# Patient Record
Sex: Female | Born: 1945 | Race: White | Hispanic: No | Marital: Single | Smoking: Former smoker
Health system: Southern US, Community
[De-identification: ages and names within clinical notes are randomized; demographics above are authoritative.]

## PROBLEM LIST (undated history)

## (undated) DIAGNOSIS — I219 Acute myocardial infarction, unspecified: Secondary | ICD-10-CM

## (undated) DIAGNOSIS — F329 Major depressive disorder, single episode, unspecified: Secondary | ICD-10-CM

## (undated) DIAGNOSIS — F1010 Alcohol abuse, uncomplicated: Secondary | ICD-10-CM

## (undated) DIAGNOSIS — F419 Anxiety disorder, unspecified: Secondary | ICD-10-CM

## (undated) DIAGNOSIS — I10 Essential (primary) hypertension: Secondary | ICD-10-CM

## (undated) DIAGNOSIS — I2510 Atherosclerotic heart disease of native coronary artery without angina pectoris: Secondary | ICD-10-CM

## (undated) DIAGNOSIS — R0600 Dyspnea, unspecified: Secondary | ICD-10-CM

## (undated) DIAGNOSIS — Z95810 Presence of automatic (implantable) cardiac defibrillator: Secondary | ICD-10-CM

## (undated) DIAGNOSIS — F32A Depression, unspecified: Secondary | ICD-10-CM

## (undated) DIAGNOSIS — Z9289 Personal history of other medical treatment: Secondary | ICD-10-CM

## (undated) DIAGNOSIS — I509 Heart failure, unspecified: Secondary | ICD-10-CM

## (undated) DIAGNOSIS — M1990 Unspecified osteoarthritis, unspecified site: Secondary | ICD-10-CM

## (undated) HISTORY — PX: TUBAL LIGATION: SHX77

## (undated) HISTORY — PX: COLONOSCOPY: SHX174

## (undated) HISTORY — PX: OTHER SURGICAL HISTORY: SHX169

---

## 2016-06-05 DIAGNOSIS — Z9289 Personal history of other medical treatment: Secondary | ICD-10-CM

## 2016-06-05 HISTORY — DX: Personal history of other medical treatment: Z92.89

## 2016-12-03 ENCOUNTER — Inpatient Hospital Stay (HOSPITAL_BASED_OUTPATIENT_CLINIC_OR_DEPARTMENT_OTHER)
Admission: RE | Admit: 2016-12-03 | Discharge: 2016-12-15 | DRG: 233 | Disposition: A | Discharge status: Skilled Nursing Facility | Payer: Medicare PPO | Attending: Cardiothoracic Surgery | Admitting: Cardiothoracic Surgery

## 2016-12-03 ENCOUNTER — Emergency Department (HOSPITAL_BASED_OUTPATIENT_CLINIC_OR_DEPARTMENT_OTHER): Payer: Medicare PPO

## 2016-12-03 ENCOUNTER — Encounter (HOSPITAL_COMMUNITY)
Admission: RE | Disposition: A | Discharge status: Skilled Nursing Facility | Payer: Self-pay | Attending: Cardiothoracic Surgery

## 2016-12-03 ENCOUNTER — Encounter (HOSPITAL_BASED_OUTPATIENT_CLINIC_OR_DEPARTMENT_OTHER): Payer: Self-pay | Admitting: *Deleted

## 2016-12-03 DIAGNOSIS — I214 Non-ST elevation (NSTEMI) myocardial infarction: Secondary | ICD-10-CM | POA: Diagnosis present

## 2016-12-03 DIAGNOSIS — Z79899 Other long term (current) drug therapy: Secondary | ICD-10-CM

## 2016-12-03 DIAGNOSIS — I350 Nonrheumatic aortic (valve) stenosis: Secondary | ICD-10-CM | POA: Diagnosis not present

## 2016-12-03 DIAGNOSIS — R7989 Other specified abnormal findings of blood chemistry: Secondary | ICD-10-CM | POA: Diagnosis present

## 2016-12-03 DIAGNOSIS — J984 Other disorders of lung: Secondary | ICD-10-CM | POA: Diagnosis present

## 2016-12-03 DIAGNOSIS — J449 Chronic obstructive pulmonary disease, unspecified: Secondary | ICD-10-CM | POA: Diagnosis present

## 2016-12-03 DIAGNOSIS — J432 Centrilobular emphysema: Secondary | ICD-10-CM

## 2016-12-03 DIAGNOSIS — D62 Acute posthemorrhagic anemia: Secondary | ICD-10-CM | POA: Diagnosis not present

## 2016-12-03 DIAGNOSIS — R000 Tachycardia, unspecified: Secondary | ICD-10-CM | POA: Diagnosis present

## 2016-12-03 DIAGNOSIS — Z09 Encounter for follow-up examination after completed treatment for conditions other than malignant neoplasm: Secondary | ICD-10-CM

## 2016-12-03 DIAGNOSIS — Z888 Allergy status to other drugs, medicaments and biological substances status: Secondary | ICD-10-CM

## 2016-12-03 DIAGNOSIS — E876 Hypokalemia: Secondary | ICD-10-CM | POA: Diagnosis present

## 2016-12-03 DIAGNOSIS — J9811 Atelectasis: Secondary | ICD-10-CM

## 2016-12-03 DIAGNOSIS — Z9889 Other specified postprocedural states: Secondary | ICD-10-CM

## 2016-12-03 DIAGNOSIS — I10 Essential (primary) hypertension: Secondary | ICD-10-CM | POA: Diagnosis present

## 2016-12-03 DIAGNOSIS — I2510 Atherosclerotic heart disease of native coronary artery without angina pectoris: Secondary | ICD-10-CM | POA: Diagnosis present

## 2016-12-03 DIAGNOSIS — I110 Hypertensive heart disease with heart failure: Secondary | ICD-10-CM | POA: Diagnosis present

## 2016-12-03 DIAGNOSIS — I255 Ischemic cardiomyopathy: Secondary | ICD-10-CM | POA: Diagnosis present

## 2016-12-03 DIAGNOSIS — Z951 Presence of aortocoronary bypass graft: Secondary | ICD-10-CM | POA: Diagnosis not present

## 2016-12-03 DIAGNOSIS — I739 Peripheral vascular disease, unspecified: Secondary | ICD-10-CM | POA: Diagnosis not present

## 2016-12-03 DIAGNOSIS — I5021 Acute systolic (congestive) heart failure: Secondary | ICD-10-CM | POA: Diagnosis present

## 2016-12-03 DIAGNOSIS — R0989 Other specified symptoms and signs involving the circulatory and respiratory systems: Secondary | ICD-10-CM | POA: Diagnosis present

## 2016-12-03 DIAGNOSIS — Z9851 Tubal ligation status: Secondary | ICD-10-CM

## 2016-12-03 DIAGNOSIS — F17210 Nicotine dependence, cigarettes, uncomplicated: Secondary | ICD-10-CM | POA: Diagnosis present

## 2016-12-03 DIAGNOSIS — I219 Acute myocardial infarction, unspecified: Secondary | ICD-10-CM

## 2016-12-03 DIAGNOSIS — Z01810 Encounter for preprocedural cardiovascular examination: Secondary | ICD-10-CM | POA: Diagnosis not present

## 2016-12-03 DIAGNOSIS — R600 Localized edema: Secondary | ICD-10-CM

## 2016-12-03 DIAGNOSIS — Z7982 Long term (current) use of aspirin: Secondary | ICD-10-CM | POA: Diagnosis not present

## 2016-12-03 DIAGNOSIS — I509 Heart failure, unspecified: Secondary | ICD-10-CM | POA: Diagnosis not present

## 2016-12-03 DIAGNOSIS — M7989 Other specified soft tissue disorders: Secondary | ICD-10-CM

## 2016-12-03 HISTORY — PX: LEFT HEART CATH AND CORONARY ANGIOGRAPHY: CATH118249

## 2016-12-03 HISTORY — DX: Acute myocardial infarction, unspecified: I21.9

## 2016-12-03 HISTORY — DX: Alcohol abuse, uncomplicated: F10.10

## 2016-12-03 HISTORY — DX: Essential (primary) hypertension: I10

## 2016-12-03 LAB — COMPREHENSIVE METABOLIC PANEL
ALBUMIN: 3.7 g/dL (ref 3.5–5.0)
ALK PHOS: 84 U/L (ref 38–126)
ALT: 20 U/L (ref 14–54)
ANION GAP: 12 (ref 5–15)
AST: 75 U/L — ABNORMAL HIGH (ref 15–41)
BILIRUBIN TOTAL: 0.7 mg/dL (ref 0.3–1.2)
BUN: 11 mg/dL (ref 6–20)
CALCIUM: 8.6 mg/dL — AB (ref 8.9–10.3)
CO2: 25 mmol/L (ref 22–32)
Chloride: 103 mmol/L (ref 101–111)
Creatinine, Ser: 0.6 mg/dL (ref 0.44–1.00)
GFR calc Af Amer: >60 mL/min (ref >60)
GLUCOSE: 146 mg/dL — AB (ref 65–99)
POTASSIUM: 3.1 mmol/L — AB (ref 3.5–5.1)
Sodium: 140 mmol/L (ref 135–145)
TOTAL PROTEIN: 6.8 g/dL (ref 6.5–8.1)

## 2016-12-03 LAB — CBC WITH DIFFERENTIAL/PLATELET
Basophils Absolute: 0 10*3/uL (ref 0.0–0.1)
Basophils Relative: 0 %
Eosinophils Absolute: 0 10*3/uL (ref 0.0–0.7)
Eosinophils Relative: 0 %
HEMATOCRIT: 32.5 % — AB (ref 36.0–46.0)
HEMOGLOBIN: 11 g/dL — AB (ref 12.0–15.0)
LYMPHS ABS: 1 10*3/uL (ref 0.7–4.0)
LYMPHS PCT: 9 %
MCH: 29.9 pg (ref 26.0–34.0)
MCHC: 33.8 g/dL (ref 30.0–36.0)
MCV: 88.3 fL (ref 78.0–100.0)
MONO ABS: 0.7 10*3/uL (ref 0.1–1.0)
MONOS PCT: 7 %
NEUTROS ABS: 9.3 10*3/uL — AB (ref 1.7–7.7)
NEUTROS PCT: 84 %
Platelets: 301 10*3/uL (ref 150–400)
RBC: 3.68 MIL/uL — ABNORMAL LOW (ref 3.87–5.11)
RDW: 13.9 % (ref 11.5–15.5)
WBC: 11 10*3/uL — ABNORMAL HIGH (ref 4.0–10.5)

## 2016-12-03 LAB — LIPID PANEL
Cholesterol: 163 mg/dL (ref 0–200)
HDL: 42 mg/dL (ref >40)
LDL CALC: 110 mg/dL — AB (ref 0–99)
TRIGLYCERIDES: 55 mg/dL (ref <150)
Total CHOL/HDL Ratio: 3.9 RATIO
VLDL: 11 mg/dL (ref 0–40)

## 2016-12-03 LAB — TROPONIN I
TROPONIN I: 8.24 ng/mL — AB (ref <0.03)
Troponin I: 7.95 ng/mL (ref <0.03)
Troponin I: 6.84 ng/mL (ref <0.03)

## 2016-12-03 LAB — I-STAT CG4 LACTIC ACID, ED: LACTIC ACID, VENOUS: 1.22 mmol/L (ref 0.5–1.9)

## 2016-12-03 LAB — BRAIN NATRIURETIC PEPTIDE
B NATRIURETIC PEPTIDE 5: 2339.4 pg/mL — AB (ref 0.0–100.0)
B Natriuretic Peptide: 2721.4 pg/mL — ABNORMAL HIGH (ref 0.0–100.0)

## 2016-12-03 LAB — TSH: TSH: 3.663 u[IU]/mL (ref 0.350–4.500)

## 2016-12-03 SURGERY — LEFT HEART CATH AND CORONARY ANGIOGRAPHY
Anesthesia: LOCAL

## 2016-12-03 MED ORDER — HEPARIN SODIUM (PORCINE) 1000 UNIT/ML IJ SOLN
INTRAMUSCULAR | Status: AC
  Filled 2016-12-03: qty 1

## 2016-12-03 MED ORDER — MIDAZOLAM HCL 2 MG/2ML IJ SOLN
INTRAMUSCULAR | Status: AC
  Filled 2016-12-03: qty 2

## 2016-12-03 MED ORDER — PNEUMOCOCCAL VAC POLYVALENT 25 MCG/0.5ML IJ INJ: 0.5000 mL | INJECTION | INTRAMUSCULAR | Status: DC

## 2016-12-03 MED ORDER — ASPIRIN EC 81 MG PO TBEC
81.0000 mg | DELAYED_RELEASE_TABLET | Freq: Every day | ORAL | Status: DC
  Administered 2016-12-04 – 2016-12-07 (×4): 81 mg via ORAL
  Filled 2016-12-03 (×4): qty 1

## 2016-12-03 MED ORDER — ASPIRIN 81 MG PO CHEW
324.0000 mg | CHEWABLE_TABLET | Freq: Once | ORAL | Status: AC
  Administered 2016-12-03: 324 mg via ORAL
  Filled 2016-12-03: qty 4

## 2016-12-03 MED ORDER — HEPARIN BOLUS VIA INFUSION
3000.0000 [IU] | Freq: Once | INTRAVENOUS | Status: AC
  Administered 2016-12-03: 3000 [IU] via INTRAVENOUS

## 2016-12-03 MED ORDER — BUPROPION HCL ER (XL) 150 MG PO TB24
300.0000 mg | ORAL_TABLET | Freq: Every day | ORAL | Status: DC
  Administered 2016-12-04 – 2016-12-07 (×4): 300 mg via ORAL
  Filled 2016-12-03 (×4): qty 2

## 2016-12-03 MED ORDER — FENTANYL CITRATE (PF) 100 MCG/2ML IJ SOLN
INTRAMUSCULAR | Status: DC | PRN
  Administered 2016-12-03: 25 ug via INTRAVENOUS

## 2016-12-03 MED ORDER — HEPARIN (PORCINE) IN NACL 2-0.9 UNIT/ML-% IJ SOLN
INTRAMUSCULAR | Status: AC
  Filled 2016-12-03: qty 1000

## 2016-12-03 MED ORDER — SERTRALINE HCL 100 MG PO TABS
200.0000 mg | ORAL_TABLET | Freq: Every day | ORAL | Status: DC
  Administered 2016-12-04 – 2016-12-07 (×4): 200 mg via ORAL
  Filled 2016-12-03 (×4): qty 2

## 2016-12-03 MED ORDER — FUROSEMIDE 10 MG/ML IJ SOLN
40.0000 mg | Freq: Once | INTRAMUSCULAR | Status: AC
  Administered 2016-12-03: 40 mg via INTRAVENOUS
  Filled 2016-12-03: qty 4

## 2016-12-03 MED ORDER — ATORVASTATIN CALCIUM 80 MG PO TABS
80.0000 mg | ORAL_TABLET | Freq: Every day | ORAL | Status: DC
  Administered 2016-12-04 – 2016-12-07 (×4): 80 mg via ORAL
  Filled 2016-12-03 (×4): qty 1

## 2016-12-03 MED ORDER — IOPAMIDOL (ISOVUE-370) INJECTION 76%
100.0000 mL | Freq: Once | INTRAVENOUS | Status: AC | PRN
  Administered 2016-12-03: 100 mL via INTRAVENOUS

## 2016-12-03 MED ORDER — NITROGLYCERIN 1 MG/10 ML FOR IR/CATH LAB
INTRA_ARTERIAL | Status: AC
  Filled 2016-12-03: qty 10

## 2016-12-03 MED ORDER — LIDOCAINE HCL (PF) 1 % IJ SOLN
INTRAMUSCULAR | Status: DC | PRN
  Administered 2016-12-03: 2 mL

## 2016-12-03 MED ORDER — MIDAZOLAM HCL 2 MG/2ML IJ SOLN
INTRAMUSCULAR | Status: DC | PRN
  Administered 2016-12-03: 1 mg via INTRAVENOUS

## 2016-12-03 MED ORDER — POTASSIUM CHLORIDE CRYS ER 20 MEQ PO TBCR
40.0000 meq | EXTENDED_RELEASE_TABLET | Freq: Once | ORAL | Status: AC
  Administered 2016-12-03: 40 meq via ORAL
  Filled 2016-12-03: qty 2

## 2016-12-03 MED ORDER — HEPARIN (PORCINE) IN NACL 2-0.9 UNIT/ML-% IJ SOLN
INTRAMUSCULAR | Status: DC | PRN
  Administered 2016-12-03: 23:00:00

## 2016-12-03 MED ORDER — SODIUM CHLORIDE 0.9% FLUSH
3.0000 mL | Freq: Two times a day (BID) | INTRAVENOUS | Status: DC
  Administered 2016-12-05 – 2016-12-07 (×4): 3 mL via INTRAVENOUS

## 2016-12-03 MED ORDER — FENTANYL CITRATE (PF) 100 MCG/2ML IJ SOLN
INTRAMUSCULAR | Status: AC
  Filled 2016-12-03: qty 2

## 2016-12-03 MED ORDER — HEPARIN (PORCINE) IN NACL 100-0.45 UNIT/ML-% IJ SOLN
700.0000 [IU]/h | INTRAMUSCULAR | Status: DC
  Administered 2016-12-03: 700 [IU]/h via INTRAVENOUS
  Filled 2016-12-03: qty 250

## 2016-12-03 MED ORDER — VERAPAMIL HCL 2.5 MG/ML IV SOLN
INTRAVENOUS | Status: DC | PRN
  Administered 2016-12-03: 10 mL via INTRA_ARTERIAL

## 2016-12-03 MED ORDER — IOPAMIDOL (ISOVUE-370) INJECTION 76%
INTRAVENOUS | Status: DC | PRN
  Administered 2016-12-03: 120 mL

## 2016-12-03 MED ORDER — SODIUM CHLORIDE 0.9 % IV SOLN
INTRAVENOUS | Status: AC | PRN
  Administered 2016-12-03: 100 mL/h via INTRAVENOUS

## 2016-12-03 MED ORDER — HEPARIN SODIUM (PORCINE) 1000 UNIT/ML IJ SOLN
INTRAMUSCULAR | Status: DC | PRN
  Administered 2016-12-03: 4000 [IU] via INTRAVENOUS

## 2016-12-03 MED ORDER — LIDOCAINE HCL (PF) 1 % IJ SOLN
INTRAMUSCULAR | Status: AC
  Filled 2016-12-03: qty 30

## 2016-12-03 MED ORDER — VERAPAMIL HCL 2.5 MG/ML IV SOLN
INTRAVENOUS | Status: AC
  Filled 2016-12-03: qty 2

## 2016-12-03 SURGICAL SUPPLY — 15 items
CATH INFINITI 5 FR JL3.5 (CATHETERS) ×2 IMPLANT
CATH INFINITI 5FR ANG PIGTAIL (CATHETERS) ×2 IMPLANT
CATH INFINITI JR4 5F (CATHETERS) ×2 IMPLANT
DEVICE RAD COMP TR BAND LRG (VASCULAR PRODUCTS) ×2 IMPLANT
GLIDESHEATH SLEND SS 6F .021 (SHEATH) ×2 IMPLANT
GUIDEWIRE ANGLED .035X260CM (WIRE) ×2 IMPLANT
GUIDEWIRE INQWIRE 1.5J.035X260 (WIRE) ×1 IMPLANT
INQWIRE 1.5J .035X260CM (WIRE) ×2
KIT ENCORE 26 ADVANTAGE (KITS) ×2 IMPLANT
KIT HEART LEFT (KITS) ×2 IMPLANT
PACK CARDIAC CATHETERIZATION (CUSTOM PROCEDURE TRAY) ×2 IMPLANT
SYR MEDRAD MARK V 150ML (SYRINGE) ×4 IMPLANT
TRANSDUCER W/STOPCOCK (MISCELLANEOUS) ×2 IMPLANT
TUBING CIL FLEX 10 FLL-RA (TUBING) ×2 IMPLANT
TUBING CONTRAST HIGH PRESS 20 (MISCELLANEOUS) ×2 IMPLANT

## 2016-12-03 NOTE — Progress Notes (Signed)
ANTICOAGULATION CONSULT NOTE  Pharmacy Consult for heparin Indication: chest pain/ACS  Heparin Dosing Weight: 59 kg   Assessment: 70 yof with CP, +troponin. Pharmacy consulted to dose heparin. Not on anticoagulation PTA. Hg 11, plt wnl. No bleed documented.  Goal of Therapy:  Heparin level 0.3-0.7 units/ml Monitor platelets by anticoagulation protocol: Yes   Plan:  Heparin 3000 unit bolus Start heparin at 700 units/h 6h heparin level Daily heparin level/CBC Monitor s/sx bleeding   Latoya Adams, PharmD, BCPS Clinical Pharmacist 12/03/2016 7:01 PM

## 2016-12-03 NOTE — H&P (Addendum)
CARDIOLOGY INPATIENT HISTORY AND PHYSICAL EXAMINATION NOTE  Patient ID: Latoya Adams MRN: [id number], DOB/AGE: 07/15/1945   Admit date: 12/03/2016   Primary Physician: System, Pcp Not In Primary Cardiologist: new  Reason for admission: chest pain   HPI: This is a 71 y.o. female with no prior history of CAD and but has h/o risk factors (hypertension, smoking use 60 pack years) who presented with chest pain and SOB. Patient was in his usual state of health about 3 d ago when she had severe chest pain, SOB and dizziness. She could not breath at that time. However the symptoms, abated and she went back to sleep. Last night she again had similar symptoms and she woke up with chest pain.  She describes chest pain as pressure like, no radiation, gets worse with breathing and is constantly present. This time she came to the [facility] where she was found to have trop of 8 and elevated BNP. CTA did not show PE and she was .  Transferred to [place] for further evaluation. She remained hemodynamically stable. She was received with ongoing chest pain. Therefore cardiac cath lab was activated.   Problem List: Past Medical History:  Diagnosis Date  . Alcohol abuse   . Hypertension     Past Surgical History:  Procedure Laterality Date  . dental implants    . LEFT HEART CATH AND CORONARY ANGIOGRAPHY N/A 12/03/2016   Procedure: Left Heart Cath and Coronary Angiography;  Surgeon: Tonny Bollman, MD;  Location: [facility] INVASIVE CV LAB;  Service: Cardiovascular;  Laterality: N/A;  . TUBAL LIGATION       Allergies:  Allergies  Allergen Reactions  . Codeine Hives     Home Medications Current Facility-Administered Medications  Medication Dose Route Frequency Provider Last Rate Last Dose  . 0.9 %  sodium chloride infusion  250 mL Intravenous PRN Tonny Bollman, MD      . 0.9 %  sodium chloride infusion   Intravenous Continuous Bensimhon, Bevelyn Buckles, MD 50 mL/hr at 12/05/16 2000    . 0.9  %  sodium chloride infusion  250 mL Intravenous PRN Bensimhon, Bevelyn Buckles, MD      . acetaminophen (TYLENOL) tablet 650 mg  650 mg Oral Q4H PRN Tonny Bollman, MD   650 mg at 12/04/16 4098  . aspirin EC tablet 81 mg  81 mg Oral Daily Timoteo Expose T, MD   81 mg at 12/05/16 0926  . atorvastatin (LIPITOR) tablet 80 mg  80 mg Oral q1800 Timoteo Expose T, MD   80 mg at 12/05/16 1810  . buPROPion (WELLBUTRIN XL) 24 hr tablet 300 mg  300 mg Oral Daily Timoteo Expose T, MD   300 mg at 12/05/16 0925  . carvedilol (COREG) tablet 3.125 mg  3.125 mg Oral BID WC Kathleene Hazel, MD   3.125 mg at 12/05/16 0756  . digoxin (LANOXIN) tablet 0.125 mg  0.125 mg Oral Daily Bensimhon, Bevelyn Buckles, MD   0.125 mg at 12/05/16 0925  . heparin ADULT infusion 100 units/mL (25000 units/284mL sodium chloride 0.45%)  1,450 Units/hr Intravenous Continuous Emi Holes, RPH 14.5 mL/hr at 12/05/16 2000 1,450 Units/hr at 12/05/16 2000  . LORazepam (ATIVAN) injection 1 mg  1 mg Intravenous Once Laverda Page B, NP      . losartan (COZAAR) tablet 25 mg  25 mg Oral Daily Bensimhon, Bevelyn Buckles, MD   25 mg at 12/05/16 0925  . ondansetron (ZOFRAN) injection 4 mg  4 mg Intravenous  Q6H PRN Tonny Bollman, MD      . pneumococcal 23 valent vaccine (PNU-IMMUNE) injection 0.5 mL  0.5 mL Intramuscular Prior to discharge Nahser, Deloris Ping, MD      . sertraline (ZOLOFT) tablet 200 mg  200 mg Oral Daily Timoteo Expose T, MD   200 mg at 12/05/16 0926  . sodium chloride flush (NS) 0.9 % injection 3 mL  3 mL Intravenous Q12H Timoteo Expose T, MD   3 mL at 12/05/16 0947  . sodium chloride flush (NS) 0.9 % injection 3 mL  3 mL Intravenous Q12H Tonny Bollman, MD   3 mL at 12/04/16 2140  . sodium chloride flush (NS) 0.9 % injection 3 mL  3 mL Intravenous PRN Tonny Bollman, MD      . sodium chloride flush (NS) 0.9 % injection 3 mL  3 mL Intravenous Q12H Bensimhon, Bevelyn Buckles, MD      . sodium chloride flush (NS) 0.9 % injection 3 mL  3 mL  Intravenous PRN Bensimhon, Bevelyn Buckles, MD      . spironolactone (ALDACTONE) tablet 25 mg  25 mg Oral Daily Bensimhon, Bevelyn Buckles, MD   25 mg at 12/05/16 0926  . traMADol (ULTRAM) tablet 50 mg  50 mg Oral Q6H PRN Kathleene Hazel, MD   50 mg at 12/04/16 1042     Family History  Problem Relation Age of Onset  . Heart attack Neg Hx      Social History   Social History  . Marital status: Single    Spouse name: N/A  . Number of children: N/A  . Years of education: N/A   Occupational History  . Not on file.   Social History Main Topics  . Smoking status: Current Every Day Smoker    Types: Cigarettes  . Smokeless tobacco: Never Used  . Alcohol use No  . Drug use: No  . Sexual activity: Not on file   Other Topics Concern  . Not on file   Social History Narrative  . No narrative on file     Review of Systems: General: negative for chills, fever, night sweats or weight changes.  Cardiovascular: chest pain, dyspnea  Dermatological: negative for rash Respiratory: negative for cough or wheezing Urologic: negative for hematuria Abdominal: negative for nausea, vomiting, diarrhea, bright red blood per rectum, melena, or hematemesis Neurologic: negative for visual changes, syncope, or dizziness Endocrine: no diabetes, no hypothyroidism Immunological: no lymph adenopathy Psych: non homicidal/suicidal  Physical Exam: Vitals: BP (!) 103/53 (BP Location: Left Arm)   Pulse 93   Temp 98.8 F (37.1 C) (Oral)   Resp (!) 26   Ht 5\' 8"  (1.727 m)   Wt 59 kg (130 lb)   SpO2 94%   BMI 19.77 kg/m  General: not in acute distress Neck: JVP flat, neck supple Heart: regular rate and rhythm, S1, S2, no murmurs  Lungs: CTAB  GI: non tender, non distended, bowel sounds present Extremities: no edema Neuro: AAO x 3  Psych: normal affect, no anxiety   Labs:   Results for orders placed or performed during the hospital encounter of 12/03/16 (from the past 24 hour(s))  CBC      Status: Abnormal   Collection Time: 12/05/16  2:15 AM  Result Value Ref Range   WBC 9.9 4.0 - 10.5 K/uL   RBC 3.65 (L) 3.87 - 5.11 MIL/uL   Hemoglobin 10.5 (L) 12.0 - 15.0 g/dL   HCT 40.9 (L) 81.1 - 91.4 %  MCV 88.2 78.0 - 100.0 fL   MCH 28.8 26.0 - 34.0 pg   MCHC 32.6 30.0 - 36.0 g/dL   RDW 16.1 09.6 - 04.5 %   Platelets 281 150 - 400 K/uL  Heparin level (unfractionated)     Status: Abnormal   Collection Time: 12/05/16  2:15 AM  Result Value Ref Range   Heparin Unfractionated <0.10 (L) 0.30 - 0.70 IU/mL  Basic metabolic panel     Status: Abnormal   Collection Time: 12/05/16  2:15 AM  Result Value Ref Range   Sodium 138 135 - 145 mmol/L   Potassium 4.3 3.5 - 5.1 mmol/L   Chloride 102 101 - 111 mmol/L   CO2 27 22 - 32 mmol/L   Glucose, Bld 117 (H) 65 - 99 mg/dL   BUN 11 6 - 20 mg/dL   Creatinine, Ser 4.09 0.44 - 1.00 mg/dL   Calcium 8.2 (L) 8.9 - 10.3 mg/dL   GFR calc non Af Amer >60 >60 mL/min   GFR calc Af Amer >60 >60 mL/min   Anion gap 9 5 - 15  Glucose, capillary     Status: Abnormal   Collection Time: 12/05/16  7:44 AM  Result Value Ref Range   Glucose-Capillary 121 (H) 65 - 99 mg/dL   Comment 1 Capillary Specimen   Heparin level (unfractionated)     Status: Abnormal   Collection Time: 12/05/16  8:47 AM  Result Value Ref Range   Heparin Unfractionated 0.23 (L) 0.30 - 0.70 IU/mL  Urinalysis, Routine w reflex microscopic     Status: None   Collection Time: 12/05/16  3:41 PM  Result Value Ref Range   Color, Urine YELLOW YELLOW   APPearance CLEAR CLEAR   Specific Gravity, Urine 1.018 1.005 - 1.030   pH 7.0 5.0 - 8.0   Glucose, UA NEGATIVE NEGATIVE mg/dL   Hgb urine dipstick NEGATIVE NEGATIVE   Bilirubin Urine NEGATIVE NEGATIVE   Ketones, ur NEGATIVE NEGATIVE mg/dL   Protein, ur NEGATIVE NEGATIVE mg/dL   Nitrite NEGATIVE NEGATIVE   Leukocytes, UA NEGATIVE NEGATIVE  I-STAT 3, venous blood gas (G3P V)     Status: Abnormal   Collection Time: 12/05/16  4:37 PM    Result Value Ref Range   pH, Ven 7.448 (H) 7.250 - 7.430   pCO2, Ven 36.0 (L) 44.0 - 60.0 mmHg   pO2, Ven 27.0 (LL) 32.0 - 45.0 mmHg   Bicarbonate 24.9 20.0 - 28.0 mmol/L   TCO2 26 0 - 100 mmol/L   O2 Saturation 53.0 %   Acid-Base Excess 1.0 0.0 - 2.0 mmol/L   Patient temperature HIDE    Sample type VENOUS    Comment NOTIFIED PHYSICIAN   I-STAT 3, venous blood gas (G3P V)     Status: Abnormal   Collection Time: 12/05/16  4:38 PM  Result Value Ref Range   pH, Ven 7.465 (H) 7.250 - 7.430   pCO2, Ven 35.2 (L) 44.0 - 60.0 mmHg   pO2, Ven 27.0 (LL) 32.0 - 45.0 mmHg   Bicarbonate 25.4 20.0 - 28.0 mmol/L   TCO2 26 0 - 100 mmol/L   O2 Saturation 55.0 %   Acid-Base Excess 2.0 0.0 - 2.0 mmol/L   Patient temperature HIDE    Sample type VENOUS    Comment NOTIFIED PHYSICIAN   Heparin level (unfractionated)     Status: Abnormal   Collection Time: 12/05/16  6:10 PM  Result Value Ref Range   Heparin Unfractionated 0.10 (L) 0.30 -  0.70 IU/mL     Radiology/Studies: Dg Chest 2 View  Result Date: 12/03/2016 CLINICAL DATA:  Chest pain, shortness of Breath EXAM: CHEST  2 VIEW COMPARISON:  None. FINDINGS: There is hyperinflation of the lungs compatible with COPD. Bibasilar atelectasis. Small effusions. Heart is upper limits normal in size. IMPRESSION: COPD.  Bibasilar atelectasis and small effusions. Electronically Signed   By: Charlett Nose M.D.   On: 12/03/2016 18:25   Ct Angio Chest Pe W And/or Wo Contrast  Result Date: 12/03/2016 CLINICAL DATA:  Shortness of breath. EXAM: CT ANGIOGRAPHY CHEST WITH CONTRAST TECHNIQUE: Multidetector CT imaging of the chest was performed using the standard protocol during bolus administration of intravenous contrast. Multiplanar CT image reconstructions and MIPs were obtained to evaluate the vascular anatomy. CONTRAST:  82.8 mL of Isovue 370 COMPARISON:  Chest x-ray from earlier today FINDINGS: Cardiovascular: Atherosclerosis is seen in the aorta. The heart size is  normal. Coronary artery calcifications are noted. No pulmonary emboli. Mediastinum/Nodes: Small bilateral pleural effusions are identified. No pericardial effusion. No adenopathy. Limited views of the thyroid are normal. Lungs/Pleura: Central airways are normal. No pneumothorax. Mild emphysematous changes seen in the lungs. Small bilateral pleural effusions with underlying atelectasis. Mild opacity anteriorly in the right lung on series 5, image 44 is could represent atelectasis or a subtle infectious/inflammatory process. No other pulmonary opacities are identified. No suspicious nodules or masses. Upper Abdomen: No acute abnormality. Musculoskeletal: No chest wall abnormality. No acute or significant osseous findings. Review of the MIP images confirms the above findings. IMPRESSION: 1. No pulmonary emboli. 2. Mild opacity anteriorly in the right lung could represent mild atelectasis or a subtle infectious/inflammatory process. 3. Atherosclerosis in the aorta. 4. Emphysema. 5. Small bilateral pleural effusions with atelectasis. Aortic Atherosclerosis (ICD10-I70.0) and Emphysema (ICD10-J43.9). Electronically Signed   By: Gerome Sam III M.D   On: 12/03/2016 19:09   US Venous Img Lower Unilateral Right  Result Date: 12/03/2016 CLINICAL DATA:  Right lower extremity swelling and pain EXAM: RIGHT LOWER EXTREMITY VENOUS DOPPLER ULTRASOUND TECHNIQUE: Gray-scale sonography with graded compression, as well as color Doppler and duplex ultrasound were performed to evaluate the lower extremity deep venous systems from the level of the common femoral vein and including the common femoral, femoral, profunda femoral, popliteal and calf veins including the posterior tibial, peroneal and gastrocnemius veins when visible. The superficial great saphenous vein was also interrogated. Spectral Doppler was utilized to evaluate flow at rest and with distal augmentation maneuvers in the common femoral, femoral and popliteal veins.  COMPARISON:  None. FINDINGS: Contralateral Common Femoral Vein: Respiratory phasicity is normal and symmetric with the symptomatic side. No evidence of thrombus. Normal compressibility. Common Femoral Vein: No evidence of thrombus. Normal compressibility, respiratory phasicity and response to augmentation. Saphenofemoral Junction: No evidence of thrombus. Normal compressibility and flow on color Doppler imaging. Profunda Femoral Vein: No evidence of thrombus. Normal compressibility and flow on color Doppler imaging. Femoral Vein: No evidence of thrombus. Normal compressibility, respiratory phasicity and response to augmentation. Popliteal Vein: No evidence of thrombus. Normal compressibility, respiratory phasicity and response to augmentation. Calf Veins: No evidence of thrombus. Normal compressibility and flow on color Doppler imaging. Superficial Great Saphenous Vein: No evidence of thrombus. Normal compressibility and flow on color Doppler imaging. Venous Reflux:  None. Other Findings:  None. IMPRESSION: No evidence of DVT within the right lower extremity. Edema in the calf. Electronically Signed   By: Gerome Sam III M.D   On: 12/03/2016 18:52  Mr Cardiac Morphology W Wo Contrast  Result Date: 12/04/2016 CLINICAL DATA:  Ischemic Cardiomyopathy EXAM: CARDIAC MRI TECHNIQUE: The patient was scanned on a 1.5 Tesla GE magnet. A dedicated cardiac coil was used. Functional imaging was done using Fiesta sequences. 2,3, and 4 chamber views were done to assess for RWMA's. Modified Simpson's rule using a short axis stack was used to calculate an ejection fraction on a dedicated work [employer]. The patient received 23 cc of Multihance. After 10 minutes inversion recovery sequences were used to assess for infiltration and scar tissue. CONTRAST:  23 cc Multihance FINDINGS: There was mild LAE. The RA and RV were normal. There was a trivial posterior pericardial effusion There was no ASD/PFO or VSD.  There was mild appearing AR and MR. The aortic root was normal The LV was severely dilated with diffuse hypokinesis worse in the septum and anterior wall with a small area of apical akinesis. There was no mural apical thrombus. The quantitative EF was 24% (EDV 191 cc ESV 144 cc SV 47 cc) Delayed enhancement images with gadolinium showed only a small area of subendocardial scar involving the apex IMPRESSION: 1) Severe LVE with diffuse hypokinesis worse in the septum and anterior wall with a small area of apical akinesis EF 24% 2) Small area of subendocardial scar involving the apex 3) No mural apical thrombus 4) Mild LAE 5) Mild appearing MR 6) Mild appearing AR 7) Trivial posterior pericardial effusion Charlton Haws Electronically Signed   By: Charlton Haws M.D.   On: 12/04/2016 18:44    EKG: normal sinus rhythm, LVH with ST elevation in the anterior leads with reciprocal ST depressions in the inferior leads  Echo: pending   Cardiac cath: pending  Medical decision making:  Discussed care with the patient Discussed care with the physician on the phone Reviewed labs and imaging personally Reviewed prior records  ASSESSMENT AND PLAN:  This is a 70 y.o. female without known CAD but 60 pack years and HTN presented with stuttering chest pain for last 3 days. Patient was found to be in acute CHF on presentation.    Active Problems:   NSTEMI (non-ST elevated myocardial infarction) (HCC)   Hypertension, essential   Acute congestive heart failure (HCC)   Sinus tachycardia  High risk NSTEMI TIMI score 5 (age >52, EKG >0.5 , trop, 3 risk factors, angina within 24 h), Killip class 3 atypical symptoms on presentation, continues to have chest pain 2/10 Cycle troponin, serial EKGs prn, lipid panel, TSH, HbA1c, echocardiogram in the AM IV heparin, aspirin, high dose statin (lipitor 80 mg daily), Consider cardiac catheterization   Tobacco use Smoking cessation discussed  Acute congestive heart  failure IV lasix given in the ED, will reassess symptoms later, consider aspirin, statin and beta blocker, cardiac cath tomorrow, echo in the AM  Hypertension, essential - holding amlodipine, will consider starting on lisinopril once LVEF is known and bp stabilizes tomorrow  Sinus tachycardia - could be secondary to cardiomyopathy    Signed, Joellyn Rued, MD MS 12/05/2016, 8:57 PM

## 2016-12-03 NOTE — ED Notes (Signed)
Troponin 8.24, results given to ED MD

## 2016-12-03 NOTE — ED Provider Notes (Signed)
[facility] Provider Note   CSN: [id number] Arrival date & time: 12/03/16  1708  By signing my name below, I, Diona Browner, attest that this documentation has been prepared under the direction and in the presence of Alvira Monday, MD. Electronically Signed: Diona Browner, ED Scribe. 12/03/16. 5:56 PM.  History   Chief Complaint Chief Complaint  Patient presents with  . Chest Pain  . Rash    potential cellulitis    HPI Raschelle Wisenbaker is a 71 y.o. female with a PMHx of HTN and ETOH abuse who presents to the Emergency Department complaining of SOB that started last night ~ 11:30 pm (12/02/16). She states she feels she is "suffocating." Associated sx include sleep disturbance and CP with deep breathes. Reports a sense of doom. Pt took ibuprofen with mild relief of her CP. She had a similar onset ~ 3 days ago at night. Additionally she c/o of redness and swelling to her right lower leg for the last several weeks. Pt denies diaphoresis, fever, cough, nausea, and vomiting.  Reports the chest pain is in the middle of her chest with some radiation to her back, is dull and worse with deep breaths.  Denies radiation to the arms or neck. Is not sure if it's exertional. No chest pain at this time.  Reports only history of high blood pressure, past alcohol use in recovery.  Denies history of smoking, cardiac history, DVT or PE.   The history is provided by the patient. No language interpreter was used.    Past Medical History:  Diagnosis Date  . Alcohol abuse   . Hypertension     Patient Active Problem List   Diagnosis Date Noted  . NSTEMI (non-ST elevated myocardial infarction) (HCC) 12/03/2016  . Hypertension, essential 12/03/2016  . Acute congestive heart failure (HCC) 12/03/2016  . Sinus tachycardia 12/03/2016    Past Surgical History:  Procedure Laterality Date  . dental implants    . TUBAL LIGATION      OB History    No data available       Home Medications      Prior to Admission medications   Medication Sig Start Date End Date Taking? Authorizing Provider  amLODipine (NORVASC) 10 MG tablet Take 10 mg by mouth daily.   Yes [provider]  buPROPion (WELLBUTRIN XL) 300 MG 24 hr tablet Take 300 mg by mouth daily.   Yes [provider]  sertraline (ZOLOFT) 100 MG tablet Take 200 mg by mouth daily.   Yes [provider]    Family History No family history on file.  Social History Social History  Substance Use Topics  . Smoking status: Current Every Day Smoker    Types: Cigarettes  . Smokeless tobacco: Never Used  . Alcohol use No     Allergies   Codeine   Review of Systems Review of Systems  Constitutional: Negative for diaphoresis and fever.  HENT: Negative for sore throat.   Eyes: Negative for visual disturbance.  Respiratory: Positive for shortness of breath. Negative for cough.   Cardiovascular: Positive for chest pain and leg swelling.  Gastrointestinal: Negative for abdominal pain, nausea and vomiting.  Genitourinary: Negative for difficulty urinating and dysuria.  Musculoskeletal: Negative for back pain and neck pain.  Skin: Positive for rash.  Neurological: Negative for syncope and headaches.  Psychiatric/Behavioral: Positive for sleep disturbance.     Physical Exam Updated Vital Signs BP 116/66 (BP Location: Left Arm)   Pulse (!) 101  Temp 98.1 F (36.7 C) (Oral)   Resp 19   Ht 5\' 8"  (1.727 m)   Wt 58.6 kg (129 lb 1.6 oz)   SpO2 100%   BMI 19.63 kg/m   Physical Exam  Constitutional: She is oriented to person, place, and time. She appears well-developed and well-nourished. No distress.  HENT:  Head: Normocephalic and atraumatic.  Eyes: Conjunctivae and EOM are normal.  Neck: Normal range of motion.  Cardiovascular: Regular rhythm, normal heart sounds and intact distal pulses.  Tachycardia present.  Exam reveals no gallop and no friction rub.   No murmur heard. Pulmonary/Chest:  Effort normal and breath sounds normal. No respiratory distress. She has no wheezes. She has no rales.  Abdominal: Soft. She exhibits no distension. There is no tenderness. There is no guarding.  Musculoskeletal: She exhibits no edema or tenderness.  1+ edema to RLE. 2+ edema to LLE. Mild erythema of the RLE around ankle and foot. Dry erythematous patches to the proximal leg.   Neurological: She is alert and oriented to person, place, and time.  Skin: Skin is warm and dry. No rash noted. She is not diaphoretic. No erythema.  Nursing note and vitals reviewed.    ED Treatments / Results  DIAGNOSTIC STUDIES: Oxygen Saturation is 97% on RA, normal by my interpretation.   COORDINATION OF CARE: 5:56 PM-Discussed next steps with pt which includes a CXR. Pt verbalized understanding and is agreeable with the plan.   Labs (all labs ordered are listed, but only abnormal results are displayed) Labs Reviewed  CBC WITH DIFFERENTIAL/PLATELET - Abnormal; Notable for the following:       Result Value   WBC 11.0 (*)    RBC 3.68 (*)    Hemoglobin 11.0 (*)    HCT 32.5 (*)    Neutro Abs 9.3 (*)    All other components within normal limits  COMPREHENSIVE METABOLIC PANEL - Abnormal; Notable for the following:    Potassium 3.1 (*)    Glucose, Bld 146 (*)    Calcium 8.6 (*)    AST 75 (*)    All other components within normal limits  TROPONIN I - Abnormal; Notable for the following:    Troponin I 8.24 (*)    All other components within normal limits  BRAIN NATRIURETIC PEPTIDE - Abnormal; Notable for the following:    B Natriuretic Peptide 2,721.4 (*)    All other components within normal limits  TROPONIN I - Abnormal; Notable for the following:    Troponin I 7.95 (*)    All other components within normal limits  BRAIN NATRIURETIC PEPTIDE - Abnormal; Notable for the following:    B Natriuretic Peptide 2,339.4 (*)    All other components within normal limits  TROPONIN I - Abnormal; Notable for  the following:    Troponin I 6.84 (*)    All other components within normal limits  LIPID PANEL - Abnormal; Notable for the following:    LDL Cholesterol 110 (*)    All other components within normal limits  MRSA PCR SCREENING  TSH  TROPONIN I  TROPONIN I  HEMOGLOBIN A1C  I-STAT CG4 LACTIC ACID, ED    EKG  EKG Interpretation  Date/Time:  Sunday December 03 2016 20:03:43 EDT Ventricular Rate:  110 PR Interval:  130 QRS Duration: 103 QT Interval:  331 QTC Calculation: 448 R Axis:   73 Text Interpretation:  Sinus tachycardia Elevation aVL and avR, findings of LVH, depressions lateral and inferior leads, concerning  for ischemia, no change from  previous ECG today Confirmed by Alvira Monday ([id number]) on 12/04/2016 12:15:31 AM      CRITICAL CARE: NSTEMI Performed by: Rhae Lerner   Total critical care time: 30 minutes  Critical care time was exclusive of separately billable procedures and treating other patients.  Critical care was necessary to treat or prevent imminent or life-threatening deterioration.  Critical care was time spent personally by me on the following activities: development of treatment plan with patient and/or surrogate as well as nursing, discussions with consultants, evaluation of patient's response to treatment, examination of patient, obtaining history from patient or surrogate, ordering and performing treatments and interventions, ordering and review of laboratory studies, ordering and review of radiographic studies, pulse oximetry and re-evaluation of patient's condition.   Radiology Dg Chest 2 View  Result Date: 12/03/2016 CLINICAL DATA:  Chest pain, shortness of Breath EXAM: CHEST  2 VIEW COMPARISON:  None. FINDINGS: There is hyperinflation of the lungs compatible with COPD. Bibasilar atelectasis. Small effusions. Heart is upper limits normal in size. IMPRESSION: COPD.  Bibasilar atelectasis and small effusions. Electronically Signed   By: Charlett Nose M.D.   On: 12/03/2016 18:25   Ct Angio Chest Pe W And/or Wo Contrast  Result Date: 12/03/2016 CLINICAL DATA:  Shortness of breath. EXAM: CT ANGIOGRAPHY CHEST WITH CONTRAST TECHNIQUE: Multidetector CT imaging of the chest was performed using the standard protocol during bolus administration of intravenous contrast. Multiplanar CT image reconstructions and MIPs were obtained to evaluate the vascular anatomy. CONTRAST:  82.8 mL of Isovue 370 COMPARISON:  Chest x-ray from earlier today FINDINGS: Cardiovascular: Atherosclerosis is seen in the aorta. The heart size is normal. Coronary artery calcifications are noted. No pulmonary emboli. Mediastinum/Nodes: Small bilateral pleural effusions are identified. No pericardial effusion. No adenopathy. Limited views of the thyroid are normal. Lungs/Pleura: Central airways are normal. No pneumothorax. Mild emphysematous changes seen in the lungs. Small bilateral pleural effusions with underlying atelectasis. Mild opacity anteriorly in the right lung on series 5, image 44 is could represent atelectasis or a subtle infectious/inflammatory process. No other pulmonary opacities are identified. No suspicious nodules or masses. Upper Abdomen: No acute abnormality. Musculoskeletal: No chest wall abnormality. No acute or significant osseous findings. Review of the MIP images confirms the above findings. IMPRESSION: 1. No pulmonary emboli. 2. Mild opacity anteriorly in the right lung could represent mild atelectasis or a subtle infectious/inflammatory process. 3. Atherosclerosis in the aorta. 4. Emphysema. 5. Small bilateral pleural effusions with atelectasis. Aortic Atherosclerosis (ICD10-I70.0) and Emphysema (ICD10-J43.9). Electronically Signed   By: Gerome Sam III M.D   On: 12/03/2016 19:09   US Venous Img Lower Unilateral Right  Result Date: 12/03/2016 CLINICAL DATA:  Right lower extremity swelling and pain EXAM: RIGHT LOWER EXTREMITY VENOUS DOPPLER ULTRASOUND  TECHNIQUE: Gray-scale sonography with graded compression, as well as color Doppler and duplex ultrasound were performed to evaluate the lower extremity deep venous systems from the level of the common femoral vein and including the common femoral, femoral, profunda femoral, popliteal and calf veins including the posterior tibial, peroneal and gastrocnemius veins when visible. The superficial great saphenous vein was also interrogated. Spectral Doppler was utilized to evaluate flow at rest and with distal augmentation maneuvers in the common femoral, femoral and popliteal veins. COMPARISON:  None. FINDINGS: Contralateral Common Femoral Vein: Respiratory phasicity is normal and symmetric with the symptomatic side. No evidence of thrombus. Normal compressibility. Common Femoral Vein: No evidence of thrombus. Normal compressibility, respiratory phasicity  and response to augmentation. Saphenofemoral Junction: No evidence of thrombus. Normal compressibility and flow on color Doppler imaging. Profunda Femoral Vein: No evidence of thrombus. Normal compressibility and flow on color Doppler imaging. Femoral Vein: No evidence of thrombus. Normal compressibility, respiratory phasicity and response to augmentation. Popliteal Vein: No evidence of thrombus. Normal compressibility, respiratory phasicity and response to augmentation. Calf Veins: No evidence of thrombus. Normal compressibility and flow on color Doppler imaging. Superficial Great Saphenous Vein: No evidence of thrombus. Normal compressibility and flow on color Doppler imaging. Venous Reflux:  None. Other Findings:  None. IMPRESSION: No evidence of DVT within the right lower extremity. Edema in the calf. Electronically Signed   By: Gerome Sam III M.D   On: 12/03/2016 18:52    Procedures Procedures (including critical care time)  Medications Ordered in ED Medications  heparin ADULT infusion 100 units/mL (25000 units/264mL sodium chloride 0.45%) (700  Units/hr Intravenous Transfusing/Transfer 12/03/16 2016)  buPROPion (WELLBUTRIN XL) 24 hr tablet 300 mg ( Oral MAR Unhold 12/04/16 0013)  sertraline (ZOLOFT) tablet 200 mg ( Oral MAR Unhold 12/04/16 0013)  sodium chloride flush (NS) 0.9 % injection 3 mL ( Intravenous MAR Unhold 12/04/16 0013)  aspirin EC tablet 81 mg ( Oral MAR Unhold 12/04/16 0013)  atorvastatin (LIPITOR) tablet 80 mg ( Oral MAR Unhold 12/04/16 0013)  pneumococcal 23 valent vaccine (PNU-IMMUNE) injection 0.5 mL ( Intramuscular MAR Unhold 12/04/16 0013)  sodium chloride flush (NS) 0.9 % injection 3 mL (0 mLs Intravenous Duplicate 12/04/16 0045)  sodium chloride flush (NS) 0.9 % injection 3 mL (not administered)  0.9 %  sodium chloride infusion (250 mLs Intravenous Rate/Dose Change 12/04/16 0105)  aspirin chewable tablet 81 mg (not administered)  0.9 %  sodium chloride infusion (not administered)  acetaminophen (TYLENOL) tablet 650 mg (650 mg Oral Given 12/04/16 0045)  ondansetron (ZOFRAN) injection 4 mg (not administered)  sodium chloride flush (NS) 0.9 % injection 3 mL (0 mLs Intravenous Duplicate 12/04/16 0045)  sodium chloride flush (NS) 0.9 % injection 3 mL (not administered)  0.9 %  sodium chloride infusion (not administered)  furosemide (LASIX) injection 40 mg (not administered)  potassium chloride SA (K-DUR,KLOR-CON) CR tablet 20 mEq (20 mEq Oral Given 12/04/16 0045)  aspirin chewable tablet 324 mg (324 mg Oral Given 12/03/16 1814)  iopamidol (ISOVUE-370) 76 % injection 100 mL (100 mLs Intravenous Contrast Given 12/03/16 1851)  heparin bolus via infusion 3,000 Units (3,000 Units Intravenous Bolus from Bag 12/03/16 1925)  furosemide (LASIX) injection 40 mg (40 mg Intravenous Given 12/03/16 2000)  potassium chloride SA (K-DUR,KLOR-CON) CR tablet 40 mEq (40 mEq Oral Given 12/03/16 2000)  0.9 %  sodium chloride infusion (  Stopped 12/04/16 0105)  furosemide (LASIX) injection 40 mg (40 mg Intravenous Given 12/04/16 6221)     71 year old female with a  history of hypertension, and 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 Patient tachycardic to the 120s on arrival to the emergency department. She also reports right lower extremity swelling and pain.  Initial concern by history and physical exam for was for possible pulmonary embolus. EKG shows sinus tachycardia with ischemic changes, uncertain if secondary to strain/triple vessel disease or cardiac specific etiology.  CT PE study was ordered and was in process at time of troponin result  CT PE study shows no sign of pulmonary embolus. DVT study was done prior to lab results, which  was negative for DVT in the right lower extremity. Erythema does not appear to be cellulitis, possible contact dermatitis. Would continue to monitor.   Troponin returned elevated to 8.24.  EKG findings and troponin concerning for cardiac ischemia. NO sign of PE. Discussed with Cardiology, Dr. Virgina Organ.   Patient was transferred to cardiology for further care. She is currently chest pain-free. She was given aspirin, and a heparin drip was initiated.    Initial Impression / Assessment and Plan / ED Course  I have reviewed the triage vital signs and the nursing notes.  Pertinent labs & imaging results that were available during my care of the patient were reviewed by me and considered in my medical decision making (see chart for details).     I personally performed the services described in this documentation, which was scribed in my presence. The recorded information has been reviewed and is accurate.   Final Clinical Impressions(s) / ED Diagnoses   Final diagnoses:  NSTEMI (non-ST elevated myocardial infarction) (HCC)  Leg edema, right    New Prescriptions Current Discharge Medication List       Alvira Monday, MD 12/04/16 [phone]

## 2016-12-03 NOTE — ED Triage Notes (Signed)
Pt has redness and swelling to R lower leg for several weeks.

## 2016-12-03 NOTE — Progress Notes (Signed)
Chaplain responded to STEMI call.  Patient was alert, awake and did not appear to be anxious.  Adult son, Tawanna Cooler, was in the room. Neither live in or close to [place], but came here because they didn't like the local hospitals where they lived.  When asked about pain, patient stated that her family says she under-reports pain.  She admits to some pain.  Chaplain oriented patient and son to process and locations. Chaplain offered support of any kind and patient requested prayer (background: [employer]).  Following prayer, both son and patient's eyes were very slightly teary.  They seem appreciative of the care they are receiving by the medical staff and are hopeful for answers to what is going on.    Please call as needed.   Theodoro Parma [phone]   12/03/16 2200  Clinical Encounter Type  Visited With Patient and family together  Visit Type Initial;Spiritual support  Referral From Nurse  Consult/Referral To Chaplain  Spiritual Encounters  Spiritual Needs Prayer  Stress Factors  Patient Stress Factors Health changes  Family Stress Factors Lack of knowledge  Advance Directives (For Healthcare)  Does Patient Have a Medical Advance Directive? No  Would patient like information on creating a medical advance directive? Yes (Inpatient - patient requests chaplain consult to create a medical advance directive)  Mental Health Advance Directives  Does Patient Have a Mental Health Advance Directive? No  Would patient like information on creating a mental health advance directive? No - Patient declined

## 2016-12-03 NOTE — ED Triage Notes (Signed)
Pt reports shortness of breath that began around 2300 last night. States similar incident about 3 days ago. Reports mid-sternal chest pain only with deep breathing. NAD noted at this time; pt able to speak in complete sentences. Reports taking Motrin with relief.

## 2016-12-03 NOTE — ED Notes (Signed)
Alert, NAD, calm, interactive, resps e/u, speaking in clear complete sentences, no dyspnea noted, skin W&D, VSS, mentions "chest discomfort only with deep breath", (denies: other sx). Family present. Placed on BP, [facility] present.

## 2016-12-03 NOTE — ED Notes (Signed)
Pt in CT.

## 2016-12-03 NOTE — Discharge Instructions (Addendum)
We ask the [facility] to please do the following: °1. Please obtain vital signs at least one time daily °2.Please weigh the patient daily. If he or she continues to gain weight or develops lower extremity edema, contact the office at [phone]. °3. Ambulate patient at least three times daily and please use sternal precautions. ° °Coronary Artery Bypass Grafting, Care After °This sheet gives you information about how to care for yourself after your procedure. Your health care provider may also give you more specific instructions. If you have problems or questions, contact your health care provider. °What can I expect after the procedure? °After the procedure, it is common to have: °· Nausea and a lack of appetite. °· Constipation. °· Weakness and fatigue. °· Depression or irritability. °· Pain or discomfort in your incision areas. ° °Follow these instructions at home: °Medicines °· Take over-the-counter and prescription medicines only as told by your health care provider. Do not stop taking medicines or start any new medicines without approval from your health care provider. °· If you were prescribed an antibiotic medicine, take it as told by your health care provider. Do not stop taking the antibiotic even if you start to feel better. °· Do not drive or use heavy machinery while taking prescription pain medicine. °Incision care °· Follow instructions from your health care provider about how to take care of your incisions. Make sure you: °? Wash your hands with soap and water before you change your bandage (dressing). If soap and water are not available, use hand sanitizer. °? Change your dressing as told by your health care provider. °? Leave stitches (sutures), skin glue, or adhesive strips in place. These skin closures may need to stay in place for 2 weeks or longer. If adhesive strip edges start to loosen and curl up, you may trim the loose edges. Do not remove adhesive strips completely unless your health care  provider tells you to do that. °· Keep incision areas clean, dry, and protected. °· Check your incision areas every day for signs of infection. Check for: °? More redness, swelling, or pain. °? More fluid or blood. °? Warmth. °? Pus or a bad smell. °· If incisions were made in your legs: °? Avoid crossing your legs. °? Avoid sitting for long periods of time. Change positions every 30 minutes. °? Raise (elevate) your legs when you are sitting. °Bathing °· Do not take baths, swim, or use a hot tub until your health care provider approves. °· Only take sponge baths. Pat the incisions dry. Do not rub incisions with a washcloth or towel. °· Ask your health care provider when you can shower. °Eating and drinking °· Eat foods that are high in fiber, such as raw fruits and vegetables, whole grains, beans, and nuts. Meats should be lean cut. Avoid canned, processed, and fried foods. This can help prevent constipation and is a recommended part of a heart-healthy diet. °· Drink enough fluid to keep your urine clear or pale yellow. °· Limit alcohol intake to no more than 1 drink a day for nonpregnant women and 2 drinks a day for men. One drink equals 12 oz of beer, 5 oz of wine, or 1½ oz of hard liquor. °Activity °· Rest and limit your activity as told by your health care provider. You may be instructed to: °? Stop any activity right away if you have chest pain, shortness of breath, irregular heartbeats, or dizziness. Get help right away if you have any of these   symptoms. °? Move around frequently for short periods or take short walks as directed by your health care provider. Gradually increase your activities. You may need physical therapy or cardiac rehabilitation to help strengthen your muscles and build your endurance. °? Avoid lifting, pushing, or pulling anything that is heavier than 10 lb (4.5 kg) for at least 6 weeks or as told by your health care provider. °· Do not drive until your health care provider  approves. °· Ask your health care provider when you may return to work. °· Ask your health care provider when you may resume sexual activity. °General instructions °· Do not use any products that contain nicotine or tobacco, such as cigarettes and e-cigarettes. If you need help quitting, ask your health care provider. °· Take 2-3 deep breaths every few hours during the day, while you recover. This helps expand your lungs and prevent complications like pneumonia after surgery. °· If you were given a device called an incentive spirometer, use it several times a day to practice deep breathing. Support your chest with a pillow or your arms when you take deep breaths or cough. °· Wear compression stockings as told by your health care provider. These stockings help to prevent blood clots and reduce swelling in your legs. °· Weigh yourself every day. This helps identify if your body is holding (retaining) fluid that may make your heart and lungs work harder. °· Keep all follow-up visits as told by your health care provider. This is important. °Contact a health care provider if: °· You have more redness, swelling, or pain around any incision. °· You have more fluid or blood coming from any incision. °· Any incision feels warm to the touch. °· You have pus or a bad smell coming from any incision °· You have a fever. °· You have swelling in your ankles or legs. °· You have pain in your legs. °· You gain 2 lb (0.9 kg) or more a day. °· You are nauseous or you vomit. °· You have diarrhea. °Get help right away if: °· You have chest pain that spreads to your jaw or arms. °· You are short of breath. °· You have a fast or irregular heartbeat. °· You notice a "clicking" in your breastbone (sternum) when you move. °· You have numbness or weakness in your arms or legs. °· You feel dizzy or light-headed. °Summary °· After the procedure, it is common to have pain or discomfort in the incision areas. °· Do not take baths, swim, or use a  hot tub until your health care provider approves. °· Gradually increase your activities. You may need physical therapy or cardiac rehabilitation to help strengthen your muscles and build your endurance. °· Weigh yourself every day. This helps identify if your body is holding (retaining) fluid that may make your heart and lungs work harder. °This information is not intended to replace advice given to you by your health care provider. Make sure you discuss any questions you have with your health care provider. °Document Released: 12/09/2004 Document Revised: 04/10/2016 Document Reviewed: 04/10/2016 °Elsevier Interactive Patient Education © 2018 [employer] ° °

## 2016-12-04 ENCOUNTER — Other Ambulatory Visit: Payer: Self-pay | Admitting: *Deleted

## 2016-12-04 ENCOUNTER — Inpatient Hospital Stay (HOSPITAL_COMMUNITY): Payer: Medicare PPO

## 2016-12-04 ENCOUNTER — Encounter (HOSPITAL_COMMUNITY): Payer: Self-pay | Admitting: Cardiovascular Disease

## 2016-12-04 DIAGNOSIS — I255 Ischemic cardiomyopathy: Secondary | ICD-10-CM

## 2016-12-04 DIAGNOSIS — I350 Nonrheumatic aortic (valve) stenosis: Secondary | ICD-10-CM

## 2016-12-04 DIAGNOSIS — I2510 Atherosclerotic heart disease of native coronary artery without angina pectoris: Secondary | ICD-10-CM

## 2016-12-04 DIAGNOSIS — I5021 Acute systolic (congestive) heart failure: Secondary | ICD-10-CM

## 2016-12-04 LAB — MRSA PCR SCREENING: MRSA by PCR: NEGATIVE

## 2016-12-04 LAB — COMPREHENSIVE METABOLIC PANEL
ALK PHOS: 85 U/L (ref 38–126)
ALT: 20 U/L (ref 14–54)
AST: 57 U/L — AB (ref 15–41)
Albumin: 3.3 g/dL — ABNORMAL LOW (ref 3.5–5.0)
Anion gap: 8 (ref 5–15)
BILIRUBIN TOTAL: 0.5 mg/dL (ref 0.3–1.2)
BUN: 9 mg/dL (ref 6–20)
CALCIUM: 8.1 mg/dL — AB (ref 8.9–10.3)
CHLORIDE: 105 mmol/L (ref 101–111)
CO2: 25 mmol/L (ref 22–32)
CREATININE: 0.6 mg/dL (ref 0.44–1.00)
Glucose, Bld: 127 mg/dL — ABNORMAL HIGH (ref 65–99)
Potassium: 3.2 mmol/L — ABNORMAL LOW (ref 3.5–5.1)
Sodium: 138 mmol/L (ref 135–145)
Total Protein: 6.2 g/dL — ABNORMAL LOW (ref 6.5–8.1)

## 2016-12-04 LAB — GLUCOSE, CAPILLARY: Glucose-Capillary: 125 mg/dL — ABNORMAL HIGH (ref 65–99)

## 2016-12-04 LAB — CBC
HCT: 32.6 % — ABNORMAL LOW (ref 36.0–46.0)
HEMOGLOBIN: 10.8 g/dL — AB (ref 12.0–15.0)
MCH: 28.7 pg (ref 26.0–34.0)
MCHC: 33.1 g/dL (ref 30.0–36.0)
MCV: 86.7 fL (ref 78.0–100.0)
Platelets: 285 10*3/uL (ref 150–400)
RBC: 3.76 MIL/uL — ABNORMAL LOW (ref 3.87–5.11)
RDW: 14 % (ref 11.5–15.5)
WBC: 9.7 10*3/uL (ref 4.0–10.5)

## 2016-12-04 LAB — BASIC METABOLIC PANEL
ANION GAP: 11 (ref 5–15)
BUN: 8 mg/dL (ref 6–20)
CHLORIDE: 102 mmol/L (ref 101–111)
CO2: 25 mmol/L (ref 22–32)
Calcium: 8.5 mg/dL — ABNORMAL LOW (ref 8.9–10.3)
Creatinine, Ser: 0.59 mg/dL (ref 0.44–1.00)
GFR calc Af Amer: >60 mL/min (ref >60)
GLUCOSE: 159 mg/dL — AB (ref 65–99)
POTASSIUM: 3.3 mmol/L — AB (ref 3.5–5.1)
Sodium: 138 mmol/L (ref 135–145)

## 2016-12-04 LAB — ECHOCARDIOGRAM COMPLETE
HEIGHTINCHES: 68 in
WEIGHTICAEL: 2098.78 [oz_av]

## 2016-12-04 LAB — TROPONIN I
TROPONIN I: 4.91 ng/mL — AB (ref <0.03)
Troponin I: 6.55 ng/mL (ref <0.03)

## 2016-12-04 LAB — HEPARIN LEVEL (UNFRACTIONATED): Heparin Unfractionated: <0.1 IU/mL — ABNORMAL LOW (ref 0.30–0.70)

## 2016-12-04 LAB — PROTIME-INR
INR: 1.2
PROTHROMBIN TIME: 15.3 s — AB (ref 11.4–15.2)

## 2016-12-04 MED ORDER — SODIUM CHLORIDE 0.9 % IV SOLN: 250.0000 mL | INTRAVENOUS | Status: DC | PRN

## 2016-12-04 MED ORDER — FUROSEMIDE 10 MG/ML IJ SOLN
40.0000 mg | Freq: Two times a day (BID) | INTRAMUSCULAR | Status: DC
  Administered 2016-12-04 – 2016-12-05 (×3): 40 mg via INTRAVENOUS
  Filled 2016-12-04 (×3): qty 4

## 2016-12-04 MED ORDER — HEPARIN (PORCINE) IN NACL 100-0.45 UNIT/ML-% IJ SOLN
1300.0000 [IU]/h | INTRAMUSCULAR | Status: DC
  Administered 2016-12-04: 1050 [IU]/h via INTRAVENOUS
  Administered 2016-12-04: 850 [IU]/h via INTRAVENOUS
  Filled 2016-12-04: qty 250

## 2016-12-04 MED ORDER — HEPARIN (PORCINE) IN NACL 100-0.45 UNIT/ML-% IJ SOLN
700.0000 [IU]/h | INTRAMUSCULAR | Status: DC
  Administered 2016-12-04: 700 [IU]/h via INTRAVENOUS

## 2016-12-04 MED ORDER — ACETAMINOPHEN 325 MG PO TABS
650.0000 mg | ORAL_TABLET | ORAL | Status: DC | PRN
  Administered 2016-12-04 (×2): 650 mg via ORAL
  Filled 2016-12-04 (×2): qty 2

## 2016-12-04 MED ORDER — SODIUM CHLORIDE 0.9% FLUSH
3.0000 mL | Freq: Two times a day (BID) | INTRAVENOUS | Status: DC
  Administered 2016-12-04: 3 mL via INTRAVENOUS

## 2016-12-04 MED ORDER — SODIUM CHLORIDE 0.9% FLUSH
3.0000 mL | Freq: Two times a day (BID) | INTRAVENOUS | Status: DC
  Administered 2016-12-04 (×2): 3 mL via INTRAVENOUS

## 2016-12-04 MED ORDER — LORAZEPAM 2 MG/ML IJ SOLN
1.0000 mg | Freq: Once | INTRAMUSCULAR | Status: DC
  Filled 2016-12-04 (×2): qty 1

## 2016-12-04 MED ORDER — PERFLUTREN LIPID MICROSPHERE
1.0000 mL | INTRAVENOUS | Status: AC | PRN
  Administered 2016-12-04: 2 mL via INTRAVENOUS
  Filled 2016-12-04: qty 10

## 2016-12-04 MED ORDER — SODIUM CHLORIDE 0.9 % IV SOLN: INTRAVENOUS | Status: DC

## 2016-12-04 MED ORDER — DIGOXIN 125 MCG PO TABS
0.1250 mg | ORAL_TABLET | Freq: Every day | ORAL | Status: DC
Start: 2016-12-04 — End: 2016-12-08
  Administered 2016-12-04 – 2016-12-07 (×4): 0.125 mg via ORAL
  Filled 2016-12-04 (×4): qty 1

## 2016-12-04 MED ORDER — ASPIRIN 81 MG PO CHEW: 81.0000 mg | CHEWABLE_TABLET | ORAL | Status: AC

## 2016-12-04 MED ORDER — GADOBENATE DIMEGLUMINE 529 MG/ML IV SOLN
20.0000 mL | Freq: Once | INTRAVENOUS | Status: AC | PRN
  Administered 2016-12-04: 20 mL via INTRAVENOUS

## 2016-12-04 MED ORDER — SODIUM CHLORIDE 0.9% FLUSH: 3.0000 mL | INTRAVENOUS | Status: DC | PRN

## 2016-12-04 MED ORDER — TRAMADOL HCL 50 MG PO TABS
50.0000 mg | ORAL_TABLET | Freq: Four times a day (QID) | ORAL | Status: DC | PRN
  Administered 2016-12-04: 50 mg via ORAL
  Filled 2016-12-04: qty 1

## 2016-12-04 MED ORDER — SPIRONOLACTONE 25 MG PO TABS
25.0000 mg | ORAL_TABLET | Freq: Every day | ORAL | Status: DC
  Administered 2016-12-04 – 2016-12-07 (×4): 25 mg via ORAL
  Filled 2016-12-04 (×4): qty 1

## 2016-12-04 MED ORDER — FUROSEMIDE 10 MG/ML IJ SOLN
40.0000 mg | Freq: Once | INTRAMUSCULAR | Status: AC
  Administered 2016-12-04: 40 mg via INTRAVENOUS
  Filled 2016-12-04: qty 4

## 2016-12-04 MED ORDER — POTASSIUM CHLORIDE CRYS ER 20 MEQ PO TBCR
20.0000 meq | EXTENDED_RELEASE_TABLET | Freq: Once | ORAL | Status: AC
  Administered 2016-12-04: 20 meq via ORAL
  Filled 2016-12-04: qty 1

## 2016-12-04 MED ORDER — ONDANSETRON HCL 4 MG/2ML IJ SOLN: 4.0000 mg | Freq: Four times a day (QID) | INTRAMUSCULAR | Status: DC | PRN

## 2016-12-04 MED ORDER — CARVEDILOL 3.125 MG PO TABS
3.1250 mg | ORAL_TABLET | Freq: Two times a day (BID) | ORAL | Status: DC
  Administered 2016-12-04 – 2016-12-07 (×7): 3.125 mg via ORAL
  Filled 2016-12-04 (×8): qty 1

## 2016-12-04 MED ORDER — POTASSIUM CHLORIDE CRYS ER 10 MEQ PO TBCR
20.0000 meq | EXTENDED_RELEASE_TABLET | Freq: Two times a day (BID) | ORAL | Status: DC
  Administered 2016-12-04 – 2016-12-05 (×4): 20 meq via ORAL
  Filled 2016-12-04 (×2): qty 1
  Filled 2016-12-04: qty 2
  Filled 2016-12-04: qty 1

## 2016-12-04 MED FILL — Nitroglycerin IV Soln 100 MCG/ML in D5W: INTRA_ARTERIAL | Qty: 10 | Status: AC

## 2016-12-04 NOTE — Care Management Note (Signed)
Case Management Note Donn Pierini RN, BSN Unit 2W-Case Manager-- 2H coverage [phone]  Patient Details  Name: Latoya Adams MRN: [id number] Date of Birth: August 27, 1945  Subjective/Objective:  Pt admitted with NSTEMI- plan for CABG later this week                  Action/Plan: PTA pt lived at home- independent- CM to follow post op for d/c needs  Expected Discharge Date:                  Expected Discharge Plan:  Home/Self Care  In-House Referral:     Discharge planning Services  CM Consult  Post Acute Care Choice:    Choice offered to:  Patient  DME Arranged:    DME Agency:     HH Arranged:    HH Agency:     Status of Service:  In process, will continue to follow  If discussed at Long Length of Stay Meetings, dates discussed:    Discharge Disposition:   Additional Comments:  Darrold Span, RN 12/04/2016, 9:54 PM

## 2016-12-04 NOTE — Progress Notes (Signed)
Progress Note  Patient Name: Latoya Adams Date of Encounter: 12/04/2016  Primary Cardiologist: Excell Seltzer  Subjective   Mild chest pain this am. No dyspnea.   Inpatient Medications    Scheduled Meds: . [START ON 12/05/2016] aspirin  81 mg Oral Pre-Cath  . aspirin EC  81 mg Oral Daily  . atorvastatin  80 mg Oral q1800  . buPROPion  300 mg Oral Daily  . furosemide  40 mg Intravenous Q12H  . pneumococcal 23 valent vaccine  0.5 mL Intramuscular Tomorrow-1000  . potassium chloride  20 mEq Oral BID  . sertraline  200 mg Oral Daily  . sodium chloride flush  3 mL Intravenous Q12H  . sodium chloride flush  3 mL Intravenous Q12H  . sodium chloride flush  3 mL Intravenous Q12H   Continuous Infusions: . sodium chloride 250 mL (12/04/16 0700)  . [START ON 12/05/2016] sodium chloride    . sodium chloride    . heparin 700 Units/hr (12/04/16 0700)   PRN Meds: sodium chloride, sodium chloride, acetaminophen, ondansetron (ZOFRAN) IV, sodium chloride flush, sodium chloride flush   Vital Signs    Vitals:   12/04/16 0416 12/04/16 0500 12/04/16 0600 12/04/16 0700  BP: (!) 108/58 108/60 112/60 116/61  Pulse: 95 96 99 (!) 102  Resp: 19 (!) 25 19 18   Temp: 98 F (36.7 C)   98.2 F (36.8 C)  TempSrc: Oral   Oral  SpO2: 94% 96% 95% 95%  Weight: 131 lb 2.8 oz (59.5 kg)     Height:        Intake/Output Summary (Last 24 hours) at 12/04/16 0756 Last data filed at 12/04/16 0700  Gross per 24 hour  Intake               81 ml  Output             1575 ml  Net            -1494 ml   Filed Weights   12/03/16 1717 12/03/16 2123 12/04/16 0416  Weight: 130 lb (59 kg) 129 lb 1.6 oz (58.6 kg) 131 lb 2.8 oz (59.5 kg)    Telemetry    Sinus - Personally Reviewed  ECG     Physical Exam   General: Well developed, thin, NAD  HEENT: OP clear, mucus membranes moist  SKIN: warm, dry. No rashes. Neuro: No focal deficits  Musculoskeletal: Muscle strength 5/5 all ext  Psychiatric: Mood and affect  normal  Neck:  + JVD, no carotid bruits, no thyromegaly, no lymphadenopathy.  Lungs: Mild basilar crackles.  Cardiovascular: Regular rate and rhythm. No murmurs, gallops or rubs. Abdomen:Soft. Bowel sounds present. Non-tender.  Extremities: No lower extremity edema. Pulses are 2 + in the bilateral DP/PT.   Labs    Chemistry Recent Labs Lab 12/03/16 1748  NA 140  K 3.1*  CL 103  CO2 25  GLUCOSE 146*  BUN 11  CREATININE 0.60  CALCIUM 8.6*  PROT 6.8  ALBUMIN 3.7  AST 75*  ALT 20  ALKPHOS 84  BILITOT 0.7  GFRNONAA >60  GFRAA >60  ANIONGAP 12     Hematology Recent Labs Lab 12/03/16 1748  WBC 11.0*  RBC 3.68*  HGB 11.0*  HCT 32.5*  MCV 88.3  MCH 29.9  MCHC 33.8  RDW 13.9  PLT 301    Cardiac Enzymes Recent Labs Lab 12/03/16 1748 12/03/16 2028 12/03/16 2212 12/04/16 0320  TROPONINI 8.24* 7.95* 6.84* 6.55*   No results for  input(s): TROPIPOC in the last 168 hours.   BNP Recent Labs Lab 12/03/16 1748 12/03/16 2212  BNP 2,721.4* 2,339.4*     DDimer No results for input(s): DDIMER in the last 168 hours.   Radiology    Dg Chest 2 View  Result Date: 12/03/2016 CLINICAL DATA:  Chest pain, shortness of Breath EXAM: CHEST  2 VIEW COMPARISON:  None. FINDINGS: There is hyperinflation of the lungs compatible with COPD. Bibasilar atelectasis. Small effusions. Heart is upper limits normal in size. IMPRESSION: COPD.  Bibasilar atelectasis and small effusions. Electronically Signed   By: Charlett Nose M.D.   On: 12/03/2016 18:25   Ct Angio Chest Pe W And/or Wo Contrast  Result Date: 12/03/2016 CLINICAL DATA:  Shortness of breath. EXAM: CT ANGIOGRAPHY CHEST WITH CONTRAST TECHNIQUE: Multidetector CT imaging of the chest was performed using the standard protocol during bolus administration of intravenous contrast. Multiplanar CT image reconstructions and MIPs were obtained to evaluate the vascular anatomy. CONTRAST:  82.8 mL of Isovue 370 COMPARISON:  Chest x-ray from  earlier today FINDINGS: Cardiovascular: Atherosclerosis is seen in the aorta. The heart size is normal. Coronary artery calcifications are noted. No pulmonary emboli. Mediastinum/Nodes: Small bilateral pleural effusions are identified. No pericardial effusion. No adenopathy. Limited views of the thyroid are normal. Lungs/Pleura: Central airways are normal. No pneumothorax. Mild emphysematous changes seen in the lungs. Small bilateral pleural effusions with underlying atelectasis. Mild opacity anteriorly in the right lung on series 5, image 44 is could represent atelectasis or a subtle infectious/inflammatory process. No other pulmonary opacities are identified. No suspicious nodules or masses. Upper Abdomen: No acute abnormality. Musculoskeletal: No chest wall abnormality. No acute or significant osseous findings. Review of the MIP images confirms the above findings. IMPRESSION: 1. No pulmonary emboli. 2. Mild opacity anteriorly in the right lung could represent mild atelectasis or a subtle infectious/inflammatory process. 3. Atherosclerosis in the aorta. 4. Emphysema. 5. Small bilateral pleural effusions with atelectasis. Aortic Atherosclerosis (ICD10-I70.0) and Emphysema (ICD10-J43.9). Electronically Signed   By: Gerome Sam III M.D   On: 12/03/2016 19:09   US Venous Img Lower Unilateral Right  Result Date: 12/03/2016 CLINICAL DATA:  Right lower extremity swelling and pain EXAM: RIGHT LOWER EXTREMITY VENOUS DOPPLER ULTRASOUND TECHNIQUE: Gray-scale sonography with graded compression, as well as color Doppler and duplex ultrasound were performed to evaluate the lower extremity deep venous systems from the level of the common femoral vein and including the common femoral, femoral, profunda femoral, popliteal and calf veins including the posterior tibial, peroneal and gastrocnemius veins when visible. The superficial great saphenous vein was also interrogated. Spectral Doppler was utilized to evaluate flow at  rest and with distal augmentation maneuvers in the common femoral, femoral and popliteal veins. COMPARISON:  None. FINDINGS: Contralateral Common Femoral Vein: Respiratory phasicity is normal and symmetric with the symptomatic side. No evidence of thrombus. Normal compressibility. Common Femoral Vein: No evidence of thrombus. Normal compressibility, respiratory phasicity and response to augmentation. Saphenofemoral Junction: No evidence of thrombus. Normal compressibility and flow on color Doppler imaging. Profunda Femoral Vein: No evidence of thrombus. Normal compressibility and flow on color Doppler imaging. Femoral Vein: No evidence of thrombus. Normal compressibility, respiratory phasicity and response to augmentation. Popliteal Vein: No evidence of thrombus. Normal compressibility, respiratory phasicity and response to augmentation. Calf Veins: No evidence of thrombus. Normal compressibility and flow on color Doppler imaging. Superficial Great Saphenous Vein: No evidence of thrombus. Normal compressibility and flow on color Doppler imaging. Venous Reflux:  None.  Other Findings:  None. IMPRESSION: No evidence of DVT within the right lower extremity. Edema in the calf. Electronically Signed   By: Gerome Sam III M.D   On: 12/03/2016 18:52    Cardiac Studies   Cardiac cath 12/03/16: Coronary Diagrams   Diagnostic Diagram          Patient Profile     71 y.o. female with HTN, tobacco abuse admitted with chest pain, found to have NSTEMI. 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 Plans for surgical referral once medically optimized.   Assessment & Plan    1. CAD with NSTEMI: She is admitted with ACS/NSTEMI. Cardiac cath per Dr. Excell Seltzer 12/03/16 with severe triple vessel CAD (LAD, Diagonal and PDA). CABG will be her best option for revascularization. Will plan surgical referral this week while optimizing her volume status. Will continue IV heparin given ACS presentation. Will continue ASA,  statin. Will start beta blocker today.   2. Acute systolic CHF: She is volume overloaded. Will continue diuresis with IV Lasix today. Repeat BMET this am. Echo pending today.   3. Ischemic cardiomyopathy: Will start low dose beta blocker. Will start Ace-inh as BP tolerates.   4. Hypokalemia: Repeat BMET this am.   5. Tobacco abuse: Smoking cessation is recommended.   Signed, Verne Carrow, MD  12/04/2016, 7:56 AM

## 2016-12-04 NOTE — Progress Notes (Signed)
ANTICOAGULATION CONSULT NOTE  Pharmacy Consult for heparin Indication: chest pain/ACS  Heparin Dosing Weight: 59 kg   Assessment: 70 yof with CP, +troponin. S/p emergent cath that revealed severe multivessel CAD. Pharmacy asked to resume heparin 2 hours post TR band removal which occurred around 0230 this am.   Goal of Therapy:  Heparin level 0.3-0.7 units/ml Monitor platelets by anticoagulation protocol: Yes   Plan:  1. Resume heparin at 700 units/hr 2. Heparin level in 8 hours  Pollyann Samples, PharmD, BCPS 12/04/2016, 4:20 AM

## 2016-12-04 NOTE — Progress Notes (Signed)
ANTICOAGULATION CONSULT NOTE - Follow Up Consult  Pharmacy Consult for Heparin Indication: CAD, awaiting CABG consult  Allergies  Allergen Reactions  . Codeine Hives    Patient Measurements: Height: 5\' 8"  (172.7 cm) Weight: 131 lb 2.8 oz (59.5 kg) IBW/kg (Calculated) : 63.9 Heparin Dosing Weight: 59.5 kg  Vital Signs: Temp: 98.5 F (36.9 C) (07/02 2000) Temp Source: Oral (07/02 2000) BP: 106/63 (07/02 1839) Pulse Rate: 114 (07/02 1839)  Labs:  Recent Labs  12/03/16 1748  12/03/16 2212 12/04/16 0320 12/04/16 0844 12/04/16 1223 12/04/16 2046  HGB 11.0*  --   --  10.8*  --   --   --   HCT 32.5*  --   --  32.6*  --   --   --   PLT 301  --   --  285  --   --   --   LABPROT  --   --   --  15.3*  --   --   --   INR  --   --   --  1.20  --   --   --   HEPARINUNFRC  --   --   --   --   --  <0.10* <0.10*  CREATININE 0.60  --   --  0.60 0.59  --   --   TROPONINI 8.24*  < > 6.84* 6.55* 4.91*  --   --   < > = values in this interval not displayed.  Estimated Creatinine Clearance: 63.2 mL/min (by C-G formula based on SCr of 0.59 mg/dL).  Assessment:  71 yo female s/p NSTEMI, cath revealed severe triple vessel CAD and surgery was consulted for possible CABG.  Currently scheduled for 12/11/16.      Heparin level is undetectable again tonight (< 0.10) on 850 units/hr.   No infusion problems.  RN reports patient was at MRI ~5-6pm today and unknown if drip was paused.    Goal of Therapy:  Heparin level 0.3-0.7 units/ml Monitor platelets by anticoagulation protocol: Yes   Plan:   Increase heparin drip to 1050 units/hr  Next heparin level and CBC in am.  Dennie Fetters, RPh Pager: [phone] 12/04/2016,11:09 PM

## 2016-12-04 NOTE — Progress Notes (Signed)
  Echocardiogram 2D Echocardiogram has been performed.  Jaycelynn Knickerbocker L Androw 12/04/2016, 9:38 AM

## 2016-12-04 NOTE — Progress Notes (Signed)
ANTICOAGULATION CONSULT NOTE - Follow Up Consult  Pharmacy Consult for IV heparin Indication: CAD, awaiting CABG consult  Allergies  Allergen Reactions  . Codeine Hives    Patient Measurements: Height: 5\' 8"  (172.7 cm) Weight: 131 lb 2.8 oz (59.5 kg) IBW/kg (Calculated) : 63.9 Heparin Dosing Weight: 59.5 kg  Vital Signs: Temp: 97.8 F (36.6 C) (07/02 1106) Temp Source: Oral (07/02 1106) BP: 109/65 (07/02 1300) Pulse Rate: 100 (07/02 1300)  Labs:  Recent Labs  12/03/16 1748  12/03/16 2212 12/04/16 0320 12/04/16 0844 12/04/16 1223  HGB 11.0*  --   --  10.8*  --   --   HCT 32.5*  --   --  32.6*  --   --   PLT 301  --   --  285  --   --   LABPROT  --   --   --  15.3*  --   --   INR  --   --   --  1.20  --   --   HEPARINUNFRC  --   --   --   --   --  <0.10*  CREATININE 0.60  --   --  0.60 0.59  --   TROPONINI 8.24*  < > 6.84* 6.55* 4.91*  --   < > = values in this interval not displayed.  Estimated Creatinine Clearance: 63.2 mL/min (by C-G formula based on SCr of 0.59 mg/dL).   Medications:  Infusions:  . sodium chloride 250 mL (12/04/16 0800)  . [START ON 12/05/2016] sodium chloride    . sodium chloride    . heparin      Assessment: 72 yo female s/p NSTEMI, cath revealed severe triple vessel CAD and surgery was consulted for possible CABG.  Resumed heparin post procedure.  Heparin level subtherapeutic this afternoon.  No issues with IV infusion per patient and RN.  No bleeding or complications noted.  Goal of Therapy:  Heparin level 0.3-0.7 units/ml Monitor platelets by anticoagulation protocol: Yes   Plan:  1. Increase IV heparin to 850 units/hr. 2. Check heparin level 6 hrs after rate increased. 3. Daily heparin level and CBC. 4. F/u plans for OR.  Tad Moore, BCPS  Clinical Pharmacist Pager [phone]  12/04/2016 2:14 PM

## 2016-12-04 NOTE — Progress Notes (Signed)
Responded to consult to create advanced directive. Pt needs to be stabilized before surgery, so she wishes me to explain the form to her later, and  Execute it tomorrow. She specifically wants her son to hear the explanations, so will return later. Son is at lunch now. Provided spiritual/emotional support.    12/04/16 1300  Clinical Encounter Type  Visited With Patient  Visit Type Initial;Psychological support;Spiritual support;Social support;Pre-op  Referral From Nurse  Spiritual Encounters  Spiritual Needs Brochure;Emotional  Stress Factors  Patient Stress Factors Health changes;Loss of control  Family Stress Factors Family relationships;Health changes;Loss of control   Ephraim Hamburger, [address]

## 2016-12-04 NOTE — Progress Notes (Signed)
Stopped back by to discuss advanced directive w/ pt and family in rm, son and grandson. She will review and decide what she wants tonight, and ask nurse to page for a chaplain again when she's ready to execute form. Provided emotional/spiritual support and prayer. Chaplain available for f/u.   12/04/16 1500  Clinical Encounter Type  Visited With Patient and family together  Visit Type Follow-up;Psychological support;Spiritual support;Social support;Pre-op  Referral From Nurse  Spiritual Encounters  Spiritual Needs Brochure;Prayer;Emotional  Stress Factors  Patient Stress Factors Health changes;Loss of control  Family Stress Factors Family relationships;Health changes;Loss of control   Ephraim Hamburger, [address]

## 2016-12-04 NOTE — Consult Note (Addendum)
Advanced Heart Failure Team Consult Note  Primary Cardiologist:  [employer]. Dr. Donata Clay  Reason for Consultation: Acute HF. Pre-op HF optimization  HPI:    Latoya Adams is seen today for evaluation of acute HF at the request of Dr. Donata Clay  Delightful 71 y/o woman with h/o HTN and ongoing tobacco use (50+ pack years). No known heart disease. Works for [employer].  Admitted yesterday with several days of stuttering CP Found to have NSTEMI with trop 7.9. Cath showed ulcerated, critical proximal LAD plaque with diffuse severe mid LAD disease and high-grade RPDA lesion. EF 20-25% with apical AK. Echo confirmed EF 20-25%. RV normal. cMRI EF 24% with small apical scar.    Currently still with some very mild chest pressure. Troponin falling. Denies orthopnea or PND. Denies claudication. Previously very active with her job without cardiac symptoms. No recent bleeding.    Review of Systems: [y] = yes, [ ]  = no   General: Weight gain [ ] ; Weight loss [ ] ; Anorexia [ ] ; Fatigue [ ] ; Fever [ ] ; Chills [ ] ; Weakness [ ]   Cardiac: Chest pain/pressure Cove.Etienne ]; Resting SOB [ ] ; Exertional SOB Cove.Etienne ]; Orthopnea [ ] ; Pedal Edema [ ] ; Palpitations [ ] ; Syncope [ ] ; Presyncope [ ] ; Paroxysmal nocturnal dyspnea[ ]   Pulmonary: Cough [ ] ; Wheezing[ ] ; Hemoptysis[ ] ; Sputum [ ] ; Snoring [ ]   GI: Vomiting[ ] ; Dysphagia[ ] ; Melena[ ] ; Hematochezia [ ] ; Heartburn[ ] ; Abdominal pain [ ] ; Constipation [ ] ; Diarrhea [ ] ; BRBPR [ ]   GU: Hematuria[ ] ; Dysuria [ ] ; Nocturia[ ]   Vascular: Pain in legs with walking [ ] ; Pain in feet with lying flat [ ] ; Non-healing sores [ ] ; Stroke [ ] ; TIA [ ] ; Slurred speech [ ] ;  Neuro: Headaches[ ] ; Vertigo[ ] ; Seizures[ ] ; Paresthesias[ ] ;Blurred vision [ ] ; Diplopia [ ] ; Vision changes [ ]   Ortho/Skin: Arthritis [ y]; Joint pain Cove.Etienne ]; Muscle pain [ ] ; Joint swelling [ ] ; Back Pain [ ] ; Rash [ ]   Psych: Depression[y ]; Anxiety[ ]   Heme: Bleeding  problems [ ] ; Clotting disorders [ ] ; Anemia [ ]   Endocrine: Diabetes [ ] ; Thyroid dysfunction[ ]   Home Medications Prior to Admission medications   Medication Sig Start Date End Date Taking? Authorizing Provider  amLODipine (NORVASC) 10 MG tablet Take 10 mg by mouth daily.   Yes [provider]  buPROPion (WELLBUTRIN XL) 300 MG 24 hr tablet Take 300 mg by mouth daily.   Yes [provider]  sertraline (ZOLOFT) 100 MG tablet Take 200 mg by mouth daily.   Yes [provider]    .meds Past Medical History: Past Medical History:  Diagnosis Date  . Alcohol abuse   . Hypertension     Past Surgical History: Past Surgical History:  Procedure Laterality Date  . dental implants    . LEFT HEART CATH AND CORONARY ANGIOGRAPHY N/A 12/03/2016   Procedure: Left Heart Cath and Coronary Angiography;  Surgeon: Tonny Bollman, MD;  Location: [facility] INVASIVE CV LAB;  Service: Cardiovascular;  Laterality: N/A;  . TUBAL LIGATION      Family History: Denies family h/o of HF or premature CAD.     Social History: Social History   Social History  . Marital status: Single    Spouse name: N/A  . Number of children: N/A  . Years of education: N/A   Social History Main Topics  . Smoking status: Current Every Day  Smoker    Types: Cigarettes  . Smokeless tobacco: Never Used  . Alcohol use No  . Drug use: No  . Sexual activity: Not Asked   Other Topics Concern  . None   Social History Narrative  . None    Allergies:  Allergies  Allergen Reactions  . Codeine Hives    Objective:    Vital Signs:   Temp:  [97.8 F (36.6 C)-98.5 F (36.9 C)] 98.5 F (36.9 C) (07/02 2000) Pulse Rate:  [0-114] 114 (07/02 1839) Resp:  [0-27] 15 (07/02 1600) BP: (78-125)/(53-92) 106/63 (07/02 1839) SpO2:  [0 %-100 %] 91 % (07/02 1600) Weight:  [129 lb 1.6 oz (58.6 kg)-131 lb 2.8 oz (59.5 kg)] 131 lb 2.8 oz (59.5 kg) (07/02 0416) Last BM Date:  (PTA)  Weight change: Filed  Weights   12/03/16 1717 12/03/16 2123 12/04/16 0416  Weight: 130 lb (59 kg) 129 lb 1.6 oz (58.6 kg) 131 lb 2.8 oz (59.5 kg)    Intake/Output:   Intake/Output Summary (Last 24 hours) at 12/04/16 2123 Last data filed at 12/04/16 1600  Gross per 24 hour  Intake           442.75 ml  Output             1675 ml  Net         -1232.25 ml      Physical Exam    General:  Well appearing. No resp difficulty HEENT: normal Neck: supple. JVP 9-10 . Carotids 2+ bilat; + soft left bruits. No lymphadenopathy or thyromegaly appreciated. Cor: PMI nondisplaced. Regular rate & rhythm. No rubs, gallops or murmurs. Lungs: clear with decreased BS throughout Abdomen: soft, nontender, nondistended. No hepatosplenomegaly. No bruits or masses. Good bowel sounds. Extremities: no cyanosis, clubbing, rash, edema DP 2+ bilaterally Neuro: alert & orientedx3, cranial nerves grossly intact. moves all 4 extremities w/o difficulty. Affect pleasant   Telemetry   NSR  EKG    NSR with marked LVH and diffuse ST depression. Mild ST elevation anteriorly  Labs   Basic Metabolic Panel:  Recent Labs Lab 12/03/16 1748 12/04/16 0320 12/04/16 0844  NA 140 138 138  K 3.1* 3.2* 3.3*  CL 103 105 102  CO2 25 25 25   GLUCOSE 146* 127* 159*  BUN 11 9 8   CREATININE 0.60 0.60 0.59  CALCIUM 8.6* 8.1* 8.5*    Liver Function Tests:  Recent Labs Lab 12/03/16 1748 12/04/16 0320  AST 75* 57*  ALT 20 20  ALKPHOS 84 85  BILITOT 0.7 0.5  PROT 6.8 6.2*  ALBUMIN 3.7 3.3*   No results for input(s): LIPASE, AMYLASE in the last 168 hours. No results for input(s): AMMONIA in the last 168 hours.  CBC:  Recent Labs Lab 12/03/16 1748 12/04/16 0320  WBC 11.0* 9.7  NEUTROABS 9.3*  --   HGB 11.0* 10.8*  HCT 32.5* 32.6*  MCV 88.3 86.7  PLT 301 285    Cardiac Enzymes:  Recent Labs Lab 12/03/16 1748 12/03/16 2028 12/03/16 2212 12/04/16 0320 12/04/16 0844  TROPONINI 8.24* 7.95* 6.84* 6.55* 4.91*     BNP: BNP (last 3 results)  Recent Labs  12/03/16 1748 12/03/16 2212  BNP 2,721.4* 2,339.4*    ProBNP (last 3 results) No results for input(s): PROBNP in the last 8760 hours.   CBG:  Recent Labs Lab 12/04/16 0730  GLUCAP 125*    Coagulation Studies:  Recent Labs  12/04/16 0320  LABPROT 15.3*  INR 1.20  Imaging   Mr Cardiac Morphology W Wo Contrast  Result Date: 12/04/2016 CLINICAL DATA:  Ischemic Cardiomyopathy EXAM: CARDIAC MRI TECHNIQUE: The patient was scanned on a 1.5 Tesla GE magnet. A dedicated cardiac coil was used. Functional imaging was done using Fiesta sequences. 2,3, and 4 chamber views were done to assess for RWMA's. Modified Simpson's rule using a short axis stack was used to calculate an ejection fraction on a dedicated work [employer]. The patient received 23 cc of Multihance. After 10 minutes inversion recovery sequences were used to assess for infiltration and scar tissue. CONTRAST:  23 cc Multihance FINDINGS: There was mild LAE. The RA and RV were normal. There was a trivial posterior pericardial effusion There was no ASD/PFO or VSD. There was mild appearing AR and MR. The aortic root was normal The LV was severely dilated with diffuse hypokinesis worse in the septum and anterior wall with a small area of apical akinesis. There was no mural apical thrombus. The quantitative EF was 24% (EDV 191 cc ESV 144 cc SV 47 cc) Delayed enhancement images with gadolinium showed only a small area of subendocardial scar involving the apex IMPRESSION: 1) Severe LVE with diffuse hypokinesis worse in the septum and anterior wall with a small area of apical akinesis EF 24% 2) Small area of subendocardial scar involving the apex 3) No mural apical thrombus 4) Mild LAE 5) Mild appearing MR 6) Mild appearing AR 7) Trivial posterior pericardial effusion Charlton Haws Electronically Signed   By: Charlton Haws M.D.   On: 12/04/2016 18:44      Medications:      Current Medications: . [START ON 12/05/2016] aspirin  81 mg Oral Pre-Cath  . aspirin EC  81 mg Oral Daily  . atorvastatin  80 mg Oral q1800  . buPROPion  300 mg Oral Daily  . carvedilol  3.125 mg Oral BID WC  . furosemide  40 mg Intravenous Q12H  . LORazepam  1 mg Intravenous Once  . pneumococcal 23 valent vaccine  0.5 mL Intramuscular Tomorrow-1000  . potassium chloride  20 mEq Oral BID  . sertraline  200 mg Oral Daily  . sodium chloride flush  3 mL Intravenous Q12H  . sodium chloride flush  3 mL Intravenous Q12H  . sodium chloride flush  3 mL Intravenous Q12H     Infusions: . sodium chloride 250 mL (12/04/16 0800)  . [START ON 12/05/2016] sodium chloride    . sodium chloride    . heparin 850 Units/hr (12/04/16 1430)       Patient Profile   Latoya Adams is a 71 y/o woman with h/o HTN and tobacco abuse admitted 7/1 with NSTEMI (peak trop 7.95) and acute HF found to have critical proximal LAD lesion with EF 20-25%.  Assessment/Plan   1. NSTEMI/CAD --cath films reviewed she has critical pLAD disease with ulcerated plaque at bifurcation with D1 as well as RPDA 90% lesion --still with mild chest pressure but troponin is falling --treat with ASA, statin, b-blocker, heparin and nitrates --pending CABG later this week  2. Acute systolic HF --Echo and cMRI reviewed. EF 20-25% with normal RV --cMRI EF 24% with only small scar in apex --Suspect EF will recover with CABG --Remains volume overloaded.  --Will need medical optimization --Continue IV diuresis. Add dig and spiro. Start losartan as tolerated --Plan RHC prior to OR to assess pulmonary pressures and output  --D/w Dr. Donata Clay  3. PAD --Severe aorto-iliac disease but denies claudication. Good  DP pulses on exam --Has left carotid bruit. U/S pending  4. HTN --Blood pressure well controlled. Continue current regimen.  5. Tobacco use/COPD --PFTs pending.Stressed need to quit  6. Hypokalemia --Will supp. Add spiro.     Length of Stay: 1  Arvilla Meres, MD  12/04/2016, 9:23 PM  Advanced Heart Failure Team Pager [phone] (M-F; 7a - 4p)  Please contact [facility] for night-coverage after hours (4p -7a ) and weekends on [email or web address]

## 2016-12-05 ENCOUNTER — Encounter (HOSPITAL_COMMUNITY): Payer: Self-pay | Admitting: Internal Medicine

## 2016-12-05 ENCOUNTER — Encounter (HOSPITAL_COMMUNITY)
Admission: RE | Disposition: A | Discharge status: Skilled Nursing Facility | Payer: Self-pay | Attending: Cardiothoracic Surgery

## 2016-12-05 ENCOUNTER — Inpatient Hospital Stay (HOSPITAL_COMMUNITY): Payer: Medicare PPO

## 2016-12-05 DIAGNOSIS — I2510 Atherosclerotic heart disease of native coronary artery without angina pectoris: Secondary | ICD-10-CM

## 2016-12-05 DIAGNOSIS — E876 Hypokalemia: Secondary | ICD-10-CM

## 2016-12-05 HISTORY — PX: RIGHT HEART CATH: CATH118263

## 2016-12-05 LAB — SPIROMETRY WITH GRAPH
FEF 25-75 Pre: 0.4 L/sec
FEF2575-%Pred-Pre: 18 %
FEV1-%Pred-Pre: 34 %
FEV1-Pre: 0.94 L
FEV1FVC-%Pred-Pre: 68 %
FEV6-%Pred-Pre: 50 %
FEV6-Pre: 1.7 L
FEV6FVC-%Pred-Pre: 99 %
FVC-%Pred-Pre: 51 %
FVC-Pre: 1.81 L
Pre FEV1/FVC ratio: 52 %
Pre FEV6/FVC Ratio: 95 %

## 2016-12-05 LAB — URINALYSIS, ROUTINE W REFLEX MICROSCOPIC
Bilirubin Urine: NEGATIVE
Glucose, UA: NEGATIVE mg/dL
Hgb urine dipstick: NEGATIVE
Ketones, ur: NEGATIVE mg/dL
Leukocytes, UA: NEGATIVE
Nitrite: NEGATIVE
Protein, ur: NEGATIVE mg/dL
Specific Gravity, Urine: 1.018 (ref 1.005–1.030)
pH: 7 (ref 5.0–8.0)

## 2016-12-05 LAB — POCT I-STAT 3, VENOUS BLOOD GAS (G3P V)
Acid-Base Excess: 1 mmol/L (ref 0.0–2.0)
Acid-Base Excess: 2 mmol/L (ref 0.0–2.0)
BICARBONATE: 24.9 mmol/L (ref 20.0–28.0)
Bicarbonate: 25.4 mmol/L (ref 20.0–28.0)
O2 Saturation: 53 %
O2 Saturation: 55 %
PCO2 VEN: 35.2 mmHg — AB (ref 44.0–60.0)
PCO2 VEN: 36 mmHg — AB (ref 44.0–60.0)
PH VEN: 7.448 — AB (ref 7.250–7.430)
PO2 VEN: 27 mmHg — AB (ref 32.0–45.0)
TCO2: 26 mmol/L (ref 0–100)
TCO2: 26 mmol/L (ref 0–100)
pH, Ven: 7.465 — ABNORMAL HIGH (ref 7.250–7.430)
pO2, Ven: 27 mmHg — CL (ref 32.0–45.0)

## 2016-12-05 LAB — GLUCOSE, CAPILLARY: Glucose-Capillary: 121 mg/dL — ABNORMAL HIGH (ref 65–99)

## 2016-12-05 LAB — CBC
HCT: 32.2 % — ABNORMAL LOW (ref 36.0–46.0)
HEMOGLOBIN: 10.5 g/dL — AB (ref 12.0–15.0)
MCH: 28.8 pg (ref 26.0–34.0)
MCHC: 32.6 g/dL (ref 30.0–36.0)
MCV: 88.2 fL (ref 78.0–100.0)
PLATELETS: 281 10*3/uL (ref 150–400)
RBC: 3.65 MIL/uL — AB (ref 3.87–5.11)
RDW: 14.2 % (ref 11.5–15.5)
WBC: 9.9 10*3/uL (ref 4.0–10.5)

## 2016-12-05 LAB — BASIC METABOLIC PANEL
ANION GAP: 9 (ref 5–15)
BUN: 11 mg/dL (ref 6–20)
CALCIUM: 8.2 mg/dL — AB (ref 8.9–10.3)
CO2: 27 mmol/L (ref 22–32)
Chloride: 102 mmol/L (ref 101–111)
Creatinine, Ser: 0.63 mg/dL (ref 0.44–1.00)
GFR calc Af Amer: >60 mL/min (ref >60)
GLUCOSE: 117 mg/dL — AB (ref 65–99)
POTASSIUM: 4.3 mmol/L (ref 3.5–5.1)
SODIUM: 138 mmol/L (ref 135–145)

## 2016-12-05 LAB — HEPARIN LEVEL (UNFRACTIONATED)
HEPARIN UNFRACTIONATED: 0.23 [IU]/mL — AB (ref 0.30–0.70)
Heparin Unfractionated: 0.1 IU/mL — ABNORMAL LOW (ref 0.30–0.70)

## 2016-12-05 LAB — HEMOGLOBIN A1C
Hgb A1c MFr Bld: 5.3 % (ref 4.8–5.6)
MEAN PLASMA GLUCOSE: 105 mg/dL

## 2016-12-05 SURGERY — RIGHT HEART CATH
Anesthesia: LOCAL

## 2016-12-05 MED ORDER — SODIUM CHLORIDE 0.9 % IV SOLN: 250.0000 mL | INTRAVENOUS | Status: DC | PRN

## 2016-12-05 MED ORDER — HEPARIN (PORCINE) IN NACL 2-0.9 UNIT/ML-% IJ SOLN
INTRAMUSCULAR | Status: AC
  Filled 2016-12-05: qty 500

## 2016-12-05 MED ORDER — SODIUM CHLORIDE 0.9% FLUSH
3.0000 mL | Freq: Two times a day (BID) | INTRAVENOUS | Status: DC
  Administered 2016-12-07 (×2): 3 mL via INTRAVENOUS

## 2016-12-05 MED ORDER — LIDOCAINE HCL (PF) 1 % IJ SOLN
INTRAMUSCULAR | Status: DC | PRN
  Administered 2016-12-05: 5 mL

## 2016-12-05 MED ORDER — SODIUM CHLORIDE 0.9% FLUSH: 3.0000 mL | Freq: Two times a day (BID) | INTRAVENOUS | Status: DC

## 2016-12-05 MED ORDER — SODIUM CHLORIDE 0.9% FLUSH: 3.0000 mL | INTRAVENOUS | Status: DC | PRN

## 2016-12-05 MED ORDER — ONDANSETRON HCL 4 MG/2ML IJ SOLN: 4.0000 mg | Freq: Four times a day (QID) | INTRAMUSCULAR | Status: DC | PRN

## 2016-12-05 MED ORDER — NICOTINE 14 MG/24HR TD PT24
14.0000 mg | MEDICATED_PATCH | Freq: Every day | TRANSDERMAL | Status: DC
  Administered 2016-12-05: 14 mg via TRANSDERMAL
  Filled 2016-12-05: qty 1

## 2016-12-05 MED ORDER — MIDAZOLAM HCL 2 MG/2ML IJ SOLN
INTRAMUSCULAR | Status: AC
  Filled 2016-12-05: qty 2

## 2016-12-05 MED ORDER — HEPARIN (PORCINE) IN NACL 2-0.9 UNIT/ML-% IJ SOLN
INTRAMUSCULAR | Status: AC | PRN
  Administered 2016-12-05: 500 mL via INTRA_ARTERIAL

## 2016-12-05 MED ORDER — LOSARTAN POTASSIUM 25 MG PO TABS
25.0000 mg | ORAL_TABLET | Freq: Every day | ORAL | Status: DC
  Administered 2016-12-05 – 2016-12-07 (×3): 25 mg via ORAL
  Filled 2016-12-05 (×3): qty 1

## 2016-12-05 MED ORDER — HEPARIN (PORCINE) IN NACL 100-0.45 UNIT/ML-% IJ SOLN
1550.0000 [IU]/h | INTRAMUSCULAR | Status: DC
  Administered 2016-12-05: 1450 [IU]/h via INTRAVENOUS
  Administered 2016-12-06 – 2016-12-07 (×3): 1550 [IU]/h via INTRAVENOUS
  Filled 2016-12-05 (×3): qty 250

## 2016-12-05 MED ORDER — LIDOCAINE HCL 1 % IJ SOLN
INTRAMUSCULAR | Status: AC
  Filled 2016-12-05: qty 20

## 2016-12-05 MED ORDER — SODIUM CHLORIDE 0.9% FLUSH
3.0000 mL | INTRAVENOUS | Status: DC | PRN
  Administered 2016-12-06: 3 mL via INTRAVENOUS
  Filled 2016-12-05: qty 3

## 2016-12-05 MED ORDER — FENTANYL CITRATE (PF) 100 MCG/2ML IJ SOLN
INTRAMUSCULAR | Status: DC | PRN
  Administered 2016-12-05: 25 ug via INTRAVENOUS

## 2016-12-05 MED ORDER — FENTANYL CITRATE (PF) 100 MCG/2ML IJ SOLN
INTRAMUSCULAR | Status: AC
  Filled 2016-12-05: qty 2

## 2016-12-05 MED ORDER — MIDAZOLAM HCL 2 MG/2ML IJ SOLN
INTRAMUSCULAR | Status: DC | PRN
  Administered 2016-12-05 (×2): 1 mg via INTRAVENOUS

## 2016-12-05 MED ORDER — SODIUM CHLORIDE 0.9 % IV SOLN: INTRAVENOUS | Status: AC

## 2016-12-05 MED ORDER — MOMETASONE FURO-FORMOTEROL FUM 200-5 MCG/ACT IN AERO
2.0000 | INHALATION_SPRAY | Freq: Two times a day (BID) | RESPIRATORY_TRACT | Status: DC
  Filled 2016-12-05: qty 8.8

## 2016-12-05 MED ORDER — PNEUMOCOCCAL VAC POLYVALENT 25 MCG/0.5ML IJ INJ: 0.5000 mL | INJECTION | INTRAMUSCULAR | Status: DC | PRN

## 2016-12-05 MED ORDER — ACETAMINOPHEN 325 MG PO TABS: 650.0000 mg | ORAL_TABLET | ORAL | Status: DC | PRN

## 2016-12-05 MED ORDER — SODIUM CHLORIDE 0.9 % IV SOLN: INTRAVENOUS | Status: DC

## 2016-12-05 SURGICAL SUPPLY — 12 items
CATH BALLN WEDGE 5F 110CM (CATHETERS) ×2 IMPLANT
GUIDEWIRE .025 260CM (WIRE) ×2 IMPLANT
KIT HEART LEFT (KITS) ×2 IMPLANT
PACK CARDIAC CATHETERIZATION (CUSTOM PROCEDURE TRAY) ×2 IMPLANT
PROTECTION STATION PRESSURIZED (MISCELLANEOUS) ×2
SHEATH GLIDE SLENDER 4/5FR (SHEATH) ×2 IMPLANT
SHEATH PINNACLE 7F 10CM (SHEATH) IMPLANT
STATION PROTECTION PRESSURIZED (MISCELLANEOUS) ×1 IMPLANT
TRANSDUCER W/STOPCOCK (MISCELLANEOUS) ×2 IMPLANT
TUBING ART PRESS 72  MALE/FEM (TUBING) ×1
TUBING ART PRESS 72 MALE/FEM (TUBING) ×1 IMPLANT
TUBING CIL FLEX 10 FLL-RA (TUBING) ×2 IMPLANT

## 2016-12-05 NOTE — Progress Notes (Signed)
ANTICOAGULATION CONSULT NOTE - Follow Up Consult  Pharmacy Consult for heparin Indication: CAD awaiting CVTS consult  Labs:  Recent Labs  12/03/16 1748  12/03/16 2212 12/04/16 0320 12/04/16 0844 12/04/16 1223 12/04/16 2046 12/05/16 0215  HGB 11.0*  --   --  10.8*  --   --   --  10.5*  HCT 32.5*  --   --  32.6*  --   --   --  32.2*  PLT 301  --   --  285  --   --   --  281  LABPROT  --   --   --  15.3*  --   --   --   --   INR  --   --   --  1.20  --   --   --   --   HEPARINUNFRC  --   --   --   --   --  <0.10* <0.10* <0.10*  CREATININE 0.60  --   --  0.60 0.59  --   --  0.63  TROPONINI 8.24*  < > 6.84* 6.55* 4.91*  --   --   --   < > = values in this interval not displayed.   Assessment: 71yo female remains undetectable on heparin after rate changes; RN notes no gtt issues.  Goal of Therapy:  Heparin level 0.3-0.7 units/ml   Plan:  Will increase heparin gtt by 4 units/kg/hr to 1300 units/hr and check level in 6hr.  Vernard Gambles, PharmD, BCPS  12/05/2016,3:06 AM

## 2016-12-05 NOTE — Progress Notes (Signed)
ANTICOAGULATION CONSULT NOTE - Follow Up Consult  Pharmacy Consult for Heparin Indication: CAD awaiting CABG  Allergies  Allergen Reactions  . Codeine Hives    Patient Measurements: Height: 5\' 8"  (172.7 cm) Weight: 130 lb (59 kg) IBW/kg (Calculated) : 63.9 Heparin Dosing Weight: 59.5 kg  Vital Signs: Temp: 98 F (36.7 C) (07/03 0700) Temp Source: Oral (07/03 0700) BP: 105/54 (07/03 1000) Pulse Rate: 93 (07/03 1000)  Labs:  Recent Labs  12/03/16 1748  12/03/16 2212 12/04/16 0320 12/04/16 0844  12/04/16 2046 12/05/16 0215 12/05/16 0847  HGB 11.0*  --   --  10.8*  --   --   --  10.5*  --   HCT 32.5*  --   --  32.6*  --   --   --  32.2*  --   PLT 301  --   --  285  --   --   --  281  --   LABPROT  --   --   --  15.3*  --   --   --   --   --   INR  --   --   --  1.20  --   --   --   --   --   HEPARINUNFRC  --   --   --   --   --   < > <0.10* <0.10* 0.23*  CREATININE 0.60  --   --  0.60 0.59  --   --  0.63  --   TROPONINI 8.24*  < > 6.84* 6.55* 4.91*  --   --   --   --   < > = values in this interval not displayed.  Estimated Creatinine Clearance: 62.7 mL/min (by C-G formula based on SCr of 0.63 mg/dL).  Medications: Heparin @ 1300 units/hr  Assessment: 71 yo female s/p NSTEMI, cath revealed severe triple vessel CAD and surgery was consulted for possible CABG.  Currently scheduled for 12/11/16. Heparin level remains below goal at 0.23 after rate increase this morning. CBC stable. No bleeding.  Goal of Therapy:  Heparin level 0.3-0.7 units/ml Monitor platelets by anticoagulation protocol: Yes   Plan:  1) Increase heparin to 1450 units/hr 2) Check heparin level in 6 hours  Fredrik Rigger 12/05/2016,11:48 AM

## 2016-12-05 NOTE — Interval H&P Note (Signed)
History and Physical Interval Note:  12/05/2016 4:16 PM  Latoya Adams  has presented today for surgery, with the diagnosis of chf  The various methods of treatment have been discussed with the patient and family. After consideration of risks, benefits and other options for treatment, the patient has consented to  Procedure(s): Right Heart Cath (N/A) as a surgical intervention .  [name] history has been reviewed, patient examined, no change in status, stable for surgery.  I have reviewed the patient's chart and labs.  Questions were answered to the patient's satisfaction.     Bensimhon, Reuel Boom

## 2016-12-05 NOTE — Progress Notes (Signed)
Advanced Heart Failure Rounding Note  Primary Cardiologist: Excell Seltzer  Subjective:    Feeling better this am.  No current chest discomfort. Still "tight" when she takes a deep breath. Denies orthopnea.   I/O negative 1.2 L. Weight down 1 lb. Creatinine stable at 0.63. K 3.3 -> 4.3.  cMRI EF 24% with only small scar in apex  Objective:   Weight Range: 130 lb (59 kg) Body mass index is 19.77 kg/m.   Vital Signs:   Temp:  [97.8 F (36.6 C)-98.5 F (36.9 C)] 98.2 F (36.8 C) (07/03 0300) Pulse Rate:  [94-119] 100 (07/03 0700) Resp:  [15-32] 22 (07/03 0700) BP: (78-119)/(44-92) 112/62 (07/03 0700) SpO2:  [91 %-97 %] 95 % (07/03 0700) Weight:  [130 lb (59 kg)] 130 lb (59 kg) (07/03 0500) Last BM Date:  (Prior to admission)  Weight change: Filed Weights   12/03/16 2123 12/04/16 0416 12/05/16 0500  Weight: 129 lb 1.6 oz (58.6 kg) 131 lb 2.8 oz (59.5 kg) 130 lb (59 kg)    Intake/Output:   Intake/Output Summary (Last 24 hours) at 12/05/16 0737 Last data filed at 12/05/16 0700  Gross per 24 hour  Intake           875.04 ml  Output             2100 ml  Net         -1224.96 ml      Physical Exam    General:  Well appearing. No resp difficulty HEENT: Normal Neck: Supple. JVP 8-9. Carotids 2+ bilat; no bruits. No lymphadenopathy or thyromegaly appreciated. Cor: PMI nondisplaced. Regular rate & rhythm. No rubs, gallops or murmurs. Lungs: Clear, mildly diminished BS Abdomen: Soft, nontender, nondistended. No hepatosplenomegaly. No bruits or masses. Good bowel sounds. Extremities: No cyanosis, clubbing, rash, edema Neuro: Alert & orientedx3, cranial nerves grossly intact. moves all 4 extremities w/o difficulty. Affect pleasant   Telemetry   Personally reviewed, NSR  EKG    EKG from 12/03/16 personally reviewed.   Labs    CBC  Recent Labs  12/03/16 1748 12/04/16 0320 12/05/16 0215  WBC 11.0* 9.7 9.9  NEUTROABS 9.3*  --   --   HGB 11.0* 10.8* 10.5*  HCT  32.5* 32.6* 32.2*  MCV 88.3 86.7 88.2  PLT 301 285 281   Basic Metabolic Panel  Recent Labs  12/04/16 0844 12/05/16 0215  NA 138 138  K 3.3* 4.3  CL 102 102  CO2 25 27  GLUCOSE 159* 117*  BUN 8 11  CREATININE 0.59 0.63  CALCIUM 8.5* 8.2*   Liver Function Tests  Recent Labs  12/03/16 1748 12/04/16 0320  AST 75* 57*  ALT 20 20  ALKPHOS 84 85  BILITOT 0.7 0.5  PROT 6.8 6.2*  ALBUMIN 3.7 3.3*   No results for input(s): LIPASE, AMYLASE in the last 72 hours. Cardiac Enzymes  Recent Labs  12/03/16 2212 12/04/16 0320 12/04/16 0844  TROPONINI 6.84* 6.55* 4.91*    BNP: BNP (last 3 results)  Recent Labs  12/03/16 1748 12/03/16 2212  BNP 2,721.4* 2,339.4*    ProBNP (last 3 results) No results for input(s): PROBNP in the last 8760 hours.   D-Dimer No results for input(s): DDIMER in the last 72 hours. Hemoglobin A1C  Recent Labs  12/03/16 2212  HGBA1C 5.3   Fasting Lipid Panel  Recent Labs  12/03/16 2212  CHOL 163  HDL 42  LDLCALC 110*  TRIG 55  CHOLHDL 3.9  Thyroid Function Tests  Recent Labs  12/03/16 2212  TSH 3.663    Other results:   Imaging    Mr Cardiac Morphology W Wo Contrast  Result Date: 12/04/2016 CLINICAL DATA:  Ischemic Cardiomyopathy EXAM: CARDIAC MRI TECHNIQUE: The patient was scanned on a 1.5 Tesla GE magnet. A dedicated cardiac coil was used. Functional imaging was done using Fiesta sequences. 2,3, and 4 chamber views were done to assess for RWMA's. Modified Simpson's rule using a short axis stack was used to calculate an ejection fraction on a dedicated work [employer]. The patient received 23 cc of Multihance. After 10 minutes inversion recovery sequences were used to assess for infiltration and scar tissue. CONTRAST:  23 cc Multihance FINDINGS: There was mild LAE. The RA and RV were normal. There was a trivial posterior pericardial effusion There was no ASD/PFO or VSD. There was mild appearing AR  and MR. The aortic root was normal The LV was severely dilated with diffuse hypokinesis worse in the septum and anterior wall with a small area of apical akinesis. There was no mural apical thrombus. The quantitative EF was 24% (EDV 191 cc ESV 144 cc SV 47 cc) Delayed enhancement images with gadolinium showed only a small area of subendocardial scar involving the apex IMPRESSION: 1) Severe LVE with diffuse hypokinesis worse in the septum and anterior wall with a small area of apical akinesis EF 24% 2) Small area of subendocardial scar involving the apex 3) No mural apical thrombus 4) Mild LAE 5) Mild appearing MR 6) Mild appearing AR 7) Trivial posterior pericardial effusion Charlton Haws Electronically Signed   By: Charlton Haws M.D.   On: 12/04/2016 18:44      Medications:     Scheduled Medications: . aspirin EC  81 mg Oral Daily  . atorvastatin  80 mg Oral q1800  . buPROPion  300 mg Oral Daily  . carvedilol  3.125 mg Oral BID WC  . digoxin  0.125 mg Oral Daily  . furosemide  40 mg Intravenous Q12H  . LORazepam  1 mg Intravenous Once  . potassium chloride  20 mEq Oral BID  . sertraline  200 mg Oral Daily  . sodium chloride flush  3 mL Intravenous Q12H  . sodium chloride flush  3 mL Intravenous Q12H  . sodium chloride flush  3 mL Intravenous Q12H  . spironolactone  25 mg Oral Daily     Infusions: . sodium chloride 250 mL (12/05/16 0700)  . sodium chloride 10 mL/hr at 12/05/16 0000  . sodium chloride    . heparin 1,300 Units/hr (12/05/16 0700)     PRN Medications:  sodium chloride, sodium chloride, acetaminophen, ondansetron (ZOFRAN) IV, pneumococcal 23 valent vaccine, sodium chloride flush, sodium chloride flush, traMADol    Patient Profile   Latoya Adams is a 71 y/o woman with h/o HTN and tobacco abuse admitted 7/1 with NSTEMI (peak trop 7.95) and acute HF found to have critical proximal LAD lesion with EF 20-25%.   Assessment/Plan   1. NSTEMI/CAD -- Cath films reviewed  she has critical pLAD disease with ulcerated plaque at bifurcation with D1 as well as RPDA 90% lesion -- Chest pressure resolved. Troponin peaked at 8.24 and has trended down since.  -- Continue ASA, statin, b-blocker, heparin and nitrates -- Pending CABG later this week.   2. Acute systolic HF -- Echo and cMRI reviewed. EF 20-25% with normal RV -- cMRI EF 24% with only small scar in apex -- Suspect  EF will recover with CABG -- Continue spiro 25 mg daily -- Continue digoxin 0.125 mg daily.  -- Pressures soft. Follow through day. May be able to start losartan 12.5 mg qhs. -- Continue IV lasix 40 mg at least this am.  -- Plan RHC prior to OR to assess pulmonary pressures and output. Will discuss timing with MD.   3. PAD -- Severe aorto-iliac disease but denies claudication. Good DP pulses on exam -- Has left carotid bruit. U/S pending  4. HTN -- OK on current regimen. Will adjust medications in setting of treating her systolic HF.   5. Tobacco use/COPD -- PFTs pending. (yet to be performed) -- Encouraged complete cessation.   6. Hypokalemia -- Improved at 4.3 this am -- Continue spiro 25mg  daily.  -- Adjust supp to 20 meq daily.   Length of Stay: 2  Luane School  12/05/2016, 7:37 AM  Advanced Heart Failure Team Pager [phone] (M-F; 7a - 4p)  Please contact [facility] for night-coverage after hours (4p -7a ) and weekends on [email or web address]  Patient seen and examined with the above-signed Advanced Practice Provider and/or Housestaff. I personally reviewed laboratory data, imaging studies and relevant notes. I independently examined the patient and formulated the important aspects of the plan. I have edited the note to reflect any of my changes or salient points. I have personally discussed the plan with the patient and/or family.  Chest pressure resolved. Volume status improving. Will give one more dose IV lasix. Add losartan. Will need pre-op Dopplers.   Will  plan RHC later today. Probable CABG Friday.   Arvilla Meres, MD  8:37 AM

## 2016-12-05 NOTE — CV Procedure (Signed)
RHC results  Findings:  RA = 1 RV = 30/2 PA = 33/4 (21) PCW = 11 Fick cardiac output/index = 4.4/2.6 PVR = 2.6 WU Ao sat = 90% PA sat = 53%, 55%  Assessment: 1. Normal hemodynamics 2. Mild resting hypoxemia 3. Stop lasix  Latoya Meres, MD  4:53 PM

## 2016-12-05 NOTE — H&P (View-Only) (Signed)
  Advanced Heart Failure Rounding Note  Primary Cardiologist: Cooper  Subjective:    Feeling better this am.  No current chest discomfort. Still "tight" when she takes a deep breath. Denies orthopnea.   I/O negative 1.2 L. Weight down 1 lb. Creatinine stable at 0.63. K 3.3 -> 4.3.  cMRI EF 24% with only small scar in apex  Objective:   Weight Range: 130 lb (59 kg) Body mass index is 19.77 kg/m.   Vital Signs:   Temp:  [97.8 F (36.6 C)-98.5 F (36.9 C)] 98.2 F (36.8 C) (07/03 0300) Pulse Rate:  [94-119] 100 (07/03 0700) Resp:  [15-32] 22 (07/03 0700) BP: (78-119)/(44-92) 112/62 (07/03 0700) SpO2:  [91 %-97 %] 95 % (07/03 0700) Weight:  [130 lb (59 kg)] 130 lb (59 kg) (07/03 0500) Last BM Date:  (Prior to admission)  Weight change: Filed Weights   12/03/16 2123 12/04/16 0416 12/05/16 0500  Weight: 129 lb 1.6 oz (58.6 kg) 131 lb 2.8 oz (59.5 kg) 130 lb (59 kg)    Intake/Output:   Intake/Output Summary (Last 24 hours) at 12/05/16 0737 Last data filed at 12/05/16 0700  Gross per 24 hour  Intake           875.04 ml  Output             2100 ml  Net         -1224.96 ml      Physical Exam    General:  Well appearing. No resp difficulty HEENT: Normal Neck: Supple. JVP 8-9. Carotids 2+ bilat; no bruits. No lymphadenopathy or thyromegaly appreciated. Cor: PMI nondisplaced. Regular rate & rhythm. No rubs, gallops or murmurs. Lungs: Clear, mildly diminished BS Abdomen: Soft, nontender, nondistended. No hepatosplenomegaly. No bruits or masses. Good bowel sounds. Extremities: No cyanosis, clubbing, rash, edema Neuro: Alert & orientedx3, cranial nerves grossly intact. moves all 4 extremities w/o difficulty. Affect pleasant   Telemetry   Personally reviewed, NSR  EKG    EKG from 12/03/16 personally reviewed.   Labs    CBC  Recent Labs  12/03/16 1748 12/04/16 0320 12/05/16 0215  WBC 11.0* 9.7 9.9  NEUTROABS 9.3*  --   --   HGB 11.0* 10.8* 10.5*  HCT  32.5* 32.6* 32.2*  MCV 88.3 86.7 88.2  PLT 301 285 281   Basic Metabolic Panel  Recent Labs  12/04/16 0844 12/05/16 0215  NA 138 138  K 3.3* 4.3  CL 102 102  CO2 25 27  GLUCOSE 159* 117*  BUN 8 11  CREATININE 0.59 0.63  CALCIUM 8.5* 8.2*   Liver Function Tests  Recent Labs  12/03/16 1748 12/04/16 0320  AST 75* 57*  ALT 20 20  ALKPHOS 84 85  BILITOT 0.7 0.5  PROT 6.8 6.2*  ALBUMIN 3.7 3.3*   No results for input(s): LIPASE, AMYLASE in the last 72 hours. Cardiac Enzymes  Recent Labs  12/03/16 2212 12/04/16 0320 12/04/16 0844  TROPONINI 6.84* 6.55* 4.91*    BNP: BNP (last 3 results)  Recent Labs  12/03/16 1748 12/03/16 2212  BNP 2,721.4* 2,339.4*    ProBNP (last 3 results) No results for input(s): PROBNP in the last 8760 hours.   D-Dimer No results for input(s): DDIMER in the last 72 hours. Hemoglobin A1C  Recent Labs  12/03/16 2212  HGBA1C 5.3   Fasting Lipid Panel  Recent Labs  12/03/16 2212  CHOL 163  HDL 42  LDLCALC 110*  TRIG 55  CHOLHDL 3.9     Thyroid Function Tests  Recent Labs  12/03/16 2212  TSH 3.663    Other results:   Imaging    Mr Cardiac Morphology W Wo Contrast  Result Date: 12/04/2016 CLINICAL DATA:  Ischemic Cardiomyopathy EXAM: CARDIAC MRI TECHNIQUE: The patient was scanned on a 1.5 Tesla GE magnet. A dedicated cardiac coil was used. Functional imaging was done using Fiesta sequences. 2,3, and 4 chamber views were done to assess for RWMA's. Modified Simpson's rule using a short axis stack was used to calculate an ejection fraction on a dedicated work station using Circle software. The patient received 23 cc of Multihance. After 10 minutes inversion recovery sequences were used to assess for infiltration and scar tissue. CONTRAST:  23 cc Multihance FINDINGS: There was mild LAE. The RA and RV were normal. There was a trivial posterior pericardial effusion There was no ASD/PFO or VSD. There was mild appearing AR  and MR. The aortic root was normal The LV was severely dilated with diffuse hypokinesis worse in the septum and anterior wall with a small area of apical akinesis. There was no mural apical thrombus. The quantitative EF was 24% (EDV 191 cc ESV 144 cc SV 47 cc) Delayed enhancement images with gadolinium showed only a small area of subendocardial scar involving the apex IMPRESSION: 1) Severe LVE with diffuse hypokinesis worse in the septum and anterior wall with a small area of apical akinesis EF 24% 2) Small area of subendocardial scar involving the apex 3) No mural apical thrombus 4) Mild LAE 5) Mild appearing MR 6) Mild appearing AR 7) Trivial posterior pericardial effusion Latoya Adams Electronically Signed   By: Latoya  Adams M.D.   On: 12/04/2016 18:44      Medications:     Scheduled Medications: . aspirin EC  81 mg Oral Daily  . atorvastatin  80 mg Oral q1800  . buPROPion  300 mg Oral Daily  . carvedilol  3.125 mg Oral BID WC  . digoxin  0.125 mg Oral Daily  . furosemide  40 mg Intravenous Q12H  . LORazepam  1 mg Intravenous Once  . potassium chloride  20 mEq Oral BID  . sertraline  200 mg Oral Daily  . sodium chloride flush  3 mL Intravenous Q12H  . sodium chloride flush  3 mL Intravenous Q12H  . sodium chloride flush  3 mL Intravenous Q12H  . spironolactone  25 mg Oral Daily     Infusions: . sodium chloride 250 mL (12/05/16 0700)  . sodium chloride 10 mL/hr at 12/05/16 0000  . sodium chloride    . heparin 1,300 Units/hr (12/05/16 0700)     PRN Medications:  sodium chloride, sodium chloride, acetaminophen, ondansetron (ZOFRAN) IV, pneumococcal 23 valent vaccine, sodium chloride flush, sodium chloride flush, traMADol    Patient Profile   Latoya Adams is a 71 y/o woman with h/o HTN and tobacco abuse admitted 7/1 with NSTEMI (peak trop 7.95) and acute HF found to have critical proximal LAD lesion with EF 20-25%.   Assessment/Plan   1. NSTEMI/CAD -- Cath films reviewed  she has critical pLAD disease with ulcerated plaque at bifurcation with D1 as well as RPDA 90% lesion -- Chest pressure resolved. Troponin peaked at 8.24 and has trended down since.  -- Continue ASA, statin, b-blocker, heparin and nitrates -- Pending CABG later this week.   2. Acute systolic HF -- Echo and cMRI reviewed. EF 20-25% with normal RV -- cMRI EF 24% with only small scar in apex -- Suspect   EF will recover with CABG -- Continue spiro 25 mg daily -- Continue digoxin 0.125 mg daily.  -- Pressures soft. Follow through day. May be able to start losartan 12.5 mg qhs. -- Continue IV lasix 40 mg at least this am.  -- Plan RHC prior to OR to assess pulmonary pressures and output. Will discuss timing with MD.   3. PAD -- Severe aorto-iliac disease but denies claudication. Good DP pulses on exam -- Has left carotid bruit. U/S pending  4. HTN -- OK on current regimen. Will adjust medications in setting of treating her systolic HF.   5. Tobacco use/COPD -- PFTs pending. (yet to be performed) -- Encouraged complete cessation.   6. Hypokalemia -- Improved at 4.3 this am -- Continue spiro 25mg daily.  -- Adjust supp to 20 meq daily.   Length of Stay: 2  Latoya Andrew Tillery, PA-C  12/05/2016, 7:37 AM  Advanced Heart Failure Team Pager [phone] (M-F; 7a - 4p)  Please contact [facility] for night-coverage after hours (4p -7a ) and weekends on [email or web address]  Patient seen and examined with the above-signed Advanced Practice Provider and/or Housestaff. I personally reviewed laboratory data, imaging studies and relevant notes. I independently examined the patient and formulated the important aspects of the plan. I have edited the note to reflect any of my changes or salient points. I have personally discussed the plan with the patient and/or family.  Chest pressure resolved. Volume status improving. Will give one more dose IV lasix. Add losartan. Will need pre-op Dopplers.   Will  plan RHC later today. Probable CABG Friday.   Latoya Cinquemani, MD  8:37 AM     

## 2016-12-05 NOTE — Consult Note (Signed)
[address]       [place] [address]             [phone]        Latoya Adams [facility] Medical Record #[id number] Date of Birth: [birth year]  Referring: Tonny Bollman M.D.  Primary Care: System, Pcp Not In  Chief Complaint:    Chief Complaint  Patient presents with  . Chest Pain  . Rash    potential cellulitis  Patient examined, coronary angiogram images and echocardiogram images personally reviewed and counseled with patient. Cardiac MRI myocardial viability images also personally reviewed and discussed with patient  History of Present Illness:     Very nice 71 year old smoker without prior cardiac history but with a strong family cardiac history [both parents and a sister have had CABG] presented to this hospital with 2-3 days of chest pain and increasing shortness of breath with positive cardiac enzymes. Troponin peaked at 8.5. Urgent cardiac catheterization demonstrated 95% LAD-diagonal stenosis, high-grade stenosis of the posterior descending and significant circumflex disease as well. LVEDP was elevated at 35. Ejection fraction was 20-25%. There is mild MR. She had peripheral edema on presentation. Her symptoms and fluid overload significant improvement with Lasix diuretics. She has had no recurrent symptoms of chest pain or shortness of breath.  The patient is a heavy smoker. She has changes of COPD and her CT scan but no at risk poor nodules or abnormal mediastinal adenopathy. PFTs are pending.  The patient has peripheral rash or disease with reduced pedal pulses and iliac disease on a CTA performed to rule out pulmonary was. She had a ultrasound of her legs which rule out DVT but showed only edema.  Cardiac MRI viability study shows ejection fraction 20-25 percent with only a small area of subendocardial scar at the LV apex.   Current Activity/ Functional Status: Patient has been active prior to her presentation to the hospital. She is retired  from working in a Marketing executive.   Zubrod Score: At the time of surgery this patient's most appropriate activity status/level should be described as: []     0    Normal activity, no symptoms []     1    Restricted in physical strenuous activity but ambulatory, able to do out light work []     2    Ambulatory and capable of self care, unable to do work activities, up and about                 more than 50%  Of the time                            [x]     3    Only limited self care, in bed greater than 50% of waking hours []     4    Completely disabled, no self care, confined to bed or chair []     5    Moribund  Past Medical History:  Diagnosis Date  . Alcohol abuse   . Hypertension     Past Surgical History:  Procedure Laterality Date  . dental implants    . LEFT HEART CATH AND CORONARY ANGIOGRAPHY N/A 12/03/2016   Procedure: Left Heart Cath and Coronary Angiography;  Surgeon: Tonny Bollman, MD;  Location: [facility] INVASIVE CV LAB;  Service: Cardiovascular;  Laterality: N/A;  . TUBAL LIGATION      History  Smoking Status  .  Current Every Day Smoker  . Types: Cigarettes  Smokeless Tobacco  . Never Used    History  Alcohol Use No    Social History   Social History  . Marital status: Single    Spouse name: N/A  . Number of children: N/A  . Years of education: N/A   Occupational History  . Not on file.   Social History Main Topics  . Smoking status: Current Every Day Smoker    Types: Cigarettes  . Smokeless tobacco: Never Used  . Alcohol use No  . Drug use: No  . Sexual activity: Not on file   Other Topics Concern  . Not on file   Social History Narrative  . No narrative on file    Allergies  Allergen Reactions  . Codeine Hives    Current Facility-Administered Medications  Medication Dose Route Frequency Provider Last Rate Last Dose  . 0.9 %  sodium chloride infusion  250 mL Intravenous PRN Timoteo Expose T, MD 10 mL/hr at 12/05/16 0800 250  mL at 12/05/16 0800  . 0.9 %  sodium chloride infusion   Intravenous Continuous Timoteo Expose T, MD 10 mL/hr at 12/05/16 0000    . 0.9 %  sodium chloride infusion  250 mL Intravenous PRN Tonny Bollman, MD      . 0.9 %  sodium chloride infusion  250 mL Intravenous PRN Graciella Freer, PA-C      . [START ON 12/06/2016] 0.9 %  sodium chloride infusion   Intravenous Continuous Graciella Freer, PA-C      . acetaminophen (TYLENOL) tablet 650 mg  650 mg Oral Q4H PRN Tonny Bollman, MD   650 mg at 12/04/16 4098  . aspirin EC tablet 81 mg  81 mg Oral Daily Timoteo Expose T, MD   81 mg at 12/05/16 0926  . atorvastatin (LIPITOR) tablet 80 mg  80 mg Oral q1800 Timoteo Expose T, MD   80 mg at 12/04/16 1839  . buPROPion (WELLBUTRIN XL) 24 hr tablet 300 mg  300 mg Oral Daily Timoteo Expose T, MD   300 mg at 12/05/16 0925  . carvedilol (COREG) tablet 3.125 mg  3.125 mg Oral BID WC Kathleene Hazel, MD   3.125 mg at 12/05/16 0756  . digoxin (LANOXIN) tablet 0.125 mg  0.125 mg Oral Daily Bensimhon, Bevelyn Buckles, MD   0.125 mg at 12/05/16 0925  . furosemide (LASIX) injection 40 mg  40 mg Intravenous Freada Bergeron, MD   40 mg at 12/05/16 0756  . heparin ADULT infusion 100 units/mL (25000 units/246mL sodium chloride 0.45%)  1,450 Units/hr Intravenous Continuous Emi Holes, RPH      . LORazepam (ATIVAN) injection 1 mg  1 mg Intravenous Once Laverda Page B, NP      . losartan (COZAAR) tablet 25 mg  25 mg Oral Daily Bensimhon, Bevelyn Buckles, MD   25 mg at 12/05/16 0925  . mometasone-formoterol (DULERA) 200-5 MCG/ACT inhaler 2 puff  2 puff Inhalation BID Donata Clay, Theron Arista, MD      . nicotine (NICODERM CQ - dosed in mg/24 hours) patch 14 mg  14 mg Transdermal Daily Donata Clay, Theron Arista, MD      . ondansetron [facility]) injection 4 mg  4 mg Intravenous Q6H PRN Tonny Bollman, MD      . pneumococcal 23 valent vaccine (PNU-IMMUNE) injection 0.5 mL  0.5 mL Intramuscular Prior to discharge Nahser,  Deloris Ping, MD      . potassium chloride (K-DUR,KLOR-CON)  CR tablet 20 mEq  20 mEq Oral BID Nahser, Deloris Ping, MD   Stopped at 12/05/16 2200  . sertraline (ZOLOFT) tablet 200 mg  200 mg Oral Daily Timoteo Expose T, MD   200 mg at 12/05/16 0926  . sodium chloride flush (NS) 0.9 % injection 3 mL  3 mL Intravenous Q12H Timoteo Expose T, MD   3 mL at 12/05/16 0947  . sodium chloride flush (NS) 0.9 % injection 3 mL  3 mL Intravenous Q12H Timoteo Expose T, MD   3 mL at 12/04/16 1000  . sodium chloride flush (NS) 0.9 % injection 3 mL  3 mL Intravenous PRN Timoteo Expose T, MD      . sodium chloride flush (NS) 0.9 % injection 3 mL  3 mL Intravenous Q12H Tonny Bollman, MD   3 mL at 12/04/16 2140  . sodium chloride flush (NS) 0.9 % injection 3 mL  3 mL Intravenous PRN Tonny Bollman, MD      . sodium chloride flush (NS) 0.9 % injection 3 mL  3 mL Intravenous Q12H Graciella Freer, PA-C      . sodium chloride flush (NS) 0.9 % injection 3 mL  3 mL Intravenous PRN Graciella Freer, PA-C      . spironolactone (ALDACTONE) tablet 25 mg  25 mg Oral Daily Bensimhon, Bevelyn Buckles, MD   25 mg at 12/05/16 0926  . traMADol (ULTRAM) tablet 50 mg  50 mg Oral Q6H PRN Kathleene Hazel, MD   50 mg at 12/04/16 1042    Prescriptions Prior to Admission  Medication Sig Dispense Refill Last Dose  . amLODipine (NORVASC) 10 MG tablet Take 10 mg by mouth daily.   12/03/2016 at Unknown time  . buPROPion (WELLBUTRIN XL) 300 MG 24 hr tablet Take 300 mg by mouth daily.   Past Week at Unknown time  . sertraline (ZOLOFT) 100 MG tablet Take 200 mg by mouth daily.   Past Week at Unknown time    No family history on file.   Review of Systems:  Right-hand dominant No prior history of thoracic trauma or pneumothorax No history of hospitalization for pneumonia or COPD     Cardiac Review of Systems: Y or N  Chest Pain [ y   ]  Resting SOB [ y  ] Exertional SOB  Cove.Etienne  ]  Orthopnea Cove.Etienne  ]   Pedal Edema [ y  ]      Palpitations [ y ] Syncope  [ n ]   Presyncope [ n  ]  General Review of Systems: [Y] = yes [  ]=no Constitional: recent weight change [y increased  ]; anorexia [  ]; fatigue Cove.Etienne  ]; nausea [  ]; night sweats [  ]; fever [  ]; or chills [  ]                                                               Dental: poor dentition[  ]; Last Dentist visit: One year  Eye : blurred vision [  ]; diplopia [   ]; vision changes [  ];  Amaurosis fugax[  ]; Resp: cough [  ];  wheezing[  ];  hemoptysis[  ]; shortness of breath[y ]; paroxysmal nocturnal dyspnea[  ];  dyspnea on exertion[y  ]; or orthopnea[ y ];  GI:  gallstones[  ], vomiting[  ];  dysphagia[  ]; melena[  ];  hematochezia [  ]; heartburn[  ];   Hx of  Colonoscopy[  ]; GU: kidney stones [  ]; hematuria[  ];   dysuria [  ];  nocturia[  ];  history of     obstruction [  ]; urinary frequency [  ]             Skin: rash, swelling[  ];, hair loss[  ];  peripheral edema[  ];  or itching[  ]; Musculosketetal: myalgias[  ];  joint swelling[  ];  joint erythema[  ];  joint pain[ y ];  back pain[ y ];  Heme/Lymph: bruising[  ];  bleeding[  ];  anemia[  ];  Neuro: TIA[  ];  headaches[  ];  stroke[  ];  vertigo[  ];  seizures[  ];   paresthesias[  ];  difficulty walking[  ];  Psych:depression[  ]; anxiety[  ];  Endocrine: diabetes[  ];  thyroid dysfunction[  ];  Immunizations: Flu [  ]; Pneumococcal[  ];  Other: No prior cardiac history  Physical Exam: BP (!) 105/54   Pulse 93   Temp 98 F (36.7 C) (Oral)   Resp (!) 25   Ht 5\' 8"  (1.727 m)   Wt 130 lb (59 kg)   SpO2 98%   BMI 19.77 kg/m       Physical Exam  General: Petite middle-aged Caucasian female pleasant in no acute distress HEENT: Normocephalic pupils equal , dentition adequate Neck: Supple without JVD, adenopathy, left carotid bruit Chest: Clear to auscultation, symmetrical breath sounds, no rhonchi, no tenderness             or deformity Cardiovascular: Regular rate and rhythm, no  murmur, no gallop, peripheral pulses             palpable in all extremities Abdomen:  Soft, nontender, no palpable mass or organomegaly Extremities: Warm, well-perfused, no clubbing cyanosis edema or tenderness,              no venous stasis changes of the legs Rectal/GU: Deferred Neuro: Grossly non--focal and symmetrical throughout Skin: Clean and dry without rash or ulceration    Diagnostic Studies & Laboratory data:     Recent Radiology Findings:   Dg Chest 2 View  Result Date: 12/03/2016 CLINICAL DATA:  Chest pain, shortness of Breath EXAM: CHEST  2 VIEW COMPARISON:  None. FINDINGS: There is hyperinflation of the lungs compatible with COPD. Bibasilar atelectasis. Small effusions. Heart is upper limits normal in size. IMPRESSION: COPD.  Bibasilar atelectasis and small effusions. Electronically Signed   By: Charlett Nose M.D.   On: 12/03/2016 18:25   Ct Angio Chest Pe W And/or Wo Contrast  Result Date: 12/03/2016 CLINICAL DATA:  Shortness of breath. EXAM: CT ANGIOGRAPHY CHEST WITH CONTRAST TECHNIQUE: Multidetector CT imaging of the chest was performed using the standard protocol during bolus administration of intravenous contrast. Multiplanar CT image reconstructions and MIPs were obtained to evaluate the vascular anatomy. CONTRAST:  82.8 mL of Isovue 370 COMPARISON:  Chest x-ray from earlier today FINDINGS: Cardiovascular: Atherosclerosis is seen in the aorta. The heart size is normal. Coronary artery calcifications are noted. No pulmonary emboli. Mediastinum/Nodes: Small bilateral pleural effusions are identified. No pericardial effusion. No adenopathy. Limited views of the thyroid are normal. Lungs/Pleura: Central airways are normal. No pneumothorax. Mild emphysematous changes  seen in the lungs. Small bilateral pleural effusions with underlying atelectasis. Mild opacity anteriorly in the right lung on series 5, image 44 is could represent atelectasis or a subtle infectious/inflammatory  process. No other pulmonary opacities are identified. No suspicious nodules or masses. Upper Abdomen: No acute abnormality. Musculoskeletal: No chest wall abnormality. No acute or significant osseous findings. Review of the MIP images confirms the above findings. IMPRESSION: 1. No pulmonary emboli. 2. Mild opacity anteriorly in the right lung could represent mild atelectasis or a subtle infectious/inflammatory process. 3. Atherosclerosis in the aorta. 4. Emphysema. 5. Small bilateral pleural effusions with atelectasis. Aortic Atherosclerosis (ICD10-I70.0) and Emphysema (ICD10-J43.9). Electronically Signed   By: Gerome Sam III M.D   On: 12/03/2016 19:09   US Venous Img Lower Unilateral Right  Result Date: 12/03/2016 CLINICAL DATA:  Right lower extremity swelling and pain EXAM: RIGHT LOWER EXTREMITY VENOUS DOPPLER ULTRASOUND TECHNIQUE: Gray-scale sonography with graded compression, as well as color Doppler and duplex ultrasound were performed to evaluate the lower extremity deep venous systems from the level of the common femoral vein and including the common femoral, femoral, profunda femoral, popliteal and calf veins including the posterior tibial, peroneal and gastrocnemius veins when visible. The superficial great saphenous vein was also interrogated. Spectral Doppler was utilized to evaluate flow at rest and with distal augmentation maneuvers in the common femoral, femoral and popliteal veins. COMPARISON:  None. FINDINGS: Contralateral Common Femoral Vein: Respiratory phasicity is normal and symmetric with the symptomatic side. No evidence of thrombus. Normal compressibility. Common Femoral Vein: No evidence of thrombus. Normal compressibility, respiratory phasicity and response to augmentation. Saphenofemoral Junction: No evidence of thrombus. Normal compressibility and flow on color Doppler imaging. Profunda Femoral Vein: No evidence of thrombus. Normal compressibility and flow on color Doppler imaging.  Femoral Vein: No evidence of thrombus. Normal compressibility, respiratory phasicity and response to augmentation. Popliteal Vein: No evidence of thrombus. Normal compressibility, respiratory phasicity and response to augmentation. Calf Veins: No evidence of thrombus. Normal compressibility and flow on color Doppler imaging. Superficial Great Saphenous Vein: No evidence of thrombus. Normal compressibility and flow on color Doppler imaging. Venous Reflux:  None. Other Findings:  None. IMPRESSION: No evidence of DVT within the right lower extremity. Edema in the calf. Electronically Signed   By: Gerome Sam III M.D   On: 12/03/2016 18:52   Mr Cardiac Morphology W Wo Contrast  Result Date: 12/04/2016 CLINICAL DATA:  Ischemic Cardiomyopathy EXAM: CARDIAC MRI TECHNIQUE: The patient was scanned on a 1.5 Tesla GE magnet. A dedicated cardiac coil was used. Functional imaging was done using Fiesta sequences. 2,3, and 4 chamber views were done to assess for RWMA's. Modified Simpson's rule using a short axis stack was used to calculate an ejection fraction on a dedicated work [employer], [employer]. The patient received 23 cc of Multihance. After 10 minutes inversion recovery sequences were used to assess for infiltration and scar tissue. CONTRAST:  23 cc Multihance FINDINGS: There was mild LAE. The RA and RV were normal. There was a trivial posterior pericardial effusion There was no ASD/PFO or VSD. There was mild appearing AR and MR. The aortic root was normal The LV was severely dilated with diffuse hypokinesis worse in the septum and anterior wall with a small area of apical akinesis. There was no mural apical thrombus. The quantitative EF was 24% (EDV 191 cc ESV 144 cc SV 47 cc) Delayed enhancement images with gadolinium showed only a small area of subendocardial scar involving the  apex IMPRESSION: 1) Severe LVE with diffuse hypokinesis worse in the septum and anterior wall with a small area of apical  akinesis EF 24% 2) Small area of subendocardial scar involving the apex 3) No mural apical thrombus 4) Mild LAE 5) Mild appearing MR 6) Mild appearing AR 7) Trivial posterior pericardial effusion Charlton Haws Electronically Signed   By: Charlton Haws M.D.   On: 12/04/2016 18:44     I have independently reviewed the above radiologic studies.  Recent Lab Findings: Lab Results  Component Value Date   WBC 9.9 12/05/2016   HGB 10.5 (L) 12/05/2016   HCT 32.2 (L) 12/05/2016   PLT 281 12/05/2016   GLUCOSE 117 (H) 12/05/2016   CHOL 163 12/03/2016   TRIG 55 12/03/2016   HDL 42 12/03/2016   LDLCALC 110 (H) 12/03/2016   ALT 20 12/04/2016   AST 57 (H) 12/04/2016   NA 138 12/05/2016   K 4.3 12/05/2016   CL 102 12/05/2016   CREATININE 0.63 12/05/2016   BUN 11 12/05/2016   CO2 27 12/05/2016   TSH 3.663 12/03/2016   INR 1.20 12/04/2016   HGBA1C 5.3 12/03/2016      Assessment / Plan:      Severe three-vessel coronary disease Non-ST elevation MI Ischemic cardiomyopathy ejection fraction 20-25 percent History of smoking with COPD  Cardiac MRI indicates patient probably has hibernating LV myocardium which should improve with revascularization.  Patient's pre-CABG Dopplers and PFTs are pending Patient scheduled for multivessel CABG on Friday pending results of right heart cath and assessment of her peripheral vascular disease and pulmonary disease. Plan for surgery is been discussed with patient and she understands and agrees.      @ME1 @ 12/05/2016 11:43 AM

## 2016-12-06 DIAGNOSIS — J432 Centrilobular emphysema: Secondary | ICD-10-CM

## 2016-12-06 DIAGNOSIS — I5021 Acute systolic (congestive) heart failure: Secondary | ICD-10-CM

## 2016-12-06 DIAGNOSIS — I2510 Atherosclerotic heart disease of native coronary artery without angina pectoris: Secondary | ICD-10-CM

## 2016-12-06 LAB — CBC
HEMATOCRIT: 31.4 % — AB (ref 36.0–46.0)
Hemoglobin: 10 g/dL — ABNORMAL LOW (ref 12.0–15.0)
MCH: 28.4 pg (ref 26.0–34.0)
MCHC: 31.8 g/dL (ref 30.0–36.0)
MCV: 89.2 fL (ref 78.0–100.0)
Platelets: 286 10*3/uL (ref 150–400)
RBC: 3.52 MIL/uL — AB (ref 3.87–5.11)
RDW: 14.2 % (ref 11.5–15.5)
WBC: 6.5 10*3/uL (ref 4.0–10.5)

## 2016-12-06 LAB — BASIC METABOLIC PANEL
ANION GAP: 7 (ref 5–15)
BUN: 16 mg/dL (ref 6–20)
CHLORIDE: 105 mmol/L (ref 101–111)
CO2: 23 mmol/L (ref 22–32)
Calcium: 8.2 mg/dL — ABNORMAL LOW (ref 8.9–10.3)
Creatinine, Ser: 0.62 mg/dL (ref 0.44–1.00)
GFR calc Af Amer: >60 mL/min (ref >60)
GFR calc non Af Amer: >60 mL/min (ref >60)
GLUCOSE: 104 mg/dL — AB (ref 65–99)
POTASSIUM: 3.9 mmol/L (ref 3.5–5.1)
Sodium: 135 mmol/L (ref 135–145)

## 2016-12-06 LAB — LIPID PANEL
Cholesterol: 144 mg/dL (ref 0–200)
HDL: 32 mg/dL — AB (ref >40)
LDL CALC: 89 mg/dL (ref 0–99)
TRIGLYCERIDES: 117 mg/dL (ref <150)
Total CHOL/HDL Ratio: 4.5 RATIO
VLDL: 23 mg/dL (ref 0–40)

## 2016-12-06 LAB — GLUCOSE, CAPILLARY: GLUCOSE-CAPILLARY: 89 mg/dL (ref 65–99)

## 2016-12-06 LAB — TSH: TSH: 4.875 u[IU]/mL — ABNORMAL HIGH (ref 0.350–4.500)

## 2016-12-06 LAB — HEPARIN LEVEL (UNFRACTIONATED)
Heparin Unfractionated: 0.19 IU/mL — ABNORMAL LOW (ref 0.30–0.70)
Heparin Unfractionated: 0.38 IU/mL (ref 0.30–0.70)

## 2016-12-06 NOTE — Progress Notes (Signed)
ANTICOAGULATION CONSULT NOTE - Follow Up Consult  Pharmacy Consult for Heparin  Indication: CAD awaiting CABG  Allergies  Allergen Reactions  . Codeine Hives   Patient Measurements: Height: 5\' 8"  (172.7 cm) Weight: 128 lb (58.1 kg) IBW/kg (Calculated) : 63.9  Vital Signs: Temp: 97.3 F (36.3 C) (07/04 0816) Temp Source: Oral (07/04 0816) BP: 108/55 (07/04 0600) Pulse Rate: 86 (07/04 0600)  Labs:  Recent Labs  12/03/16 2212 12/04/16 0320 12/04/16 0844  12/05/16 0215  12/05/16 1810 12/06/16 0002 12/06/16 0613  HGB  --  10.8*  --   --  10.5*  --   --  10.0*  --   HCT  --  32.6*  --   --  32.2*  --   --  31.4*  --   PLT  --  285  --   --  281  --   --  286  --   LABPROT  --  15.3*  --   --   --   --   --   --   --   INR  --  1.20  --   --   --   --   --   --   --   HEPARINUNFRC  --   --   --   < > <0.10*  < > 0.10* 0.19* 0.38  CREATININE  --  0.60 0.59  --  0.63  --   --  0.62  --   TROPONINI 6.84* 6.55* 4.91*  --   --   --   --   --   --   < > = values in this interval not displayed.  Estimated Creatinine Clearance: 61.7 mL/min (by C-G formula based on SCr of 0.62 mg/dL).  Assessment: Multi-vessel CAD awaiting CABG on Friday. Heparin drip rate 1550 uts/hr  heparin level therapeutic 0.38 after re-start post cath.    Goal of Therapy:  Heparin level 0.3-0.7 units/ml Monitor platelets by anticoagulation protocol: Yes   Plan:  Continue  heparin to 1550 units/hr Daily HL, CBC Monitor s/s bleeding  Leota Sauers Pharm.D. CPP, BCPS Clinical Pharmacist [phone] 12/06/2016 10:13 AM

## 2016-12-06 NOTE — Progress Notes (Signed)
Advanced Heart Failure Rounding Note  Primary Cardiologist: Excell Seltzer  Subjective:    RHC 7/3 with normal output. Low filling pressures. No PAH. Feels good. No CP or SOB. SBP 100-110.   Remains on heparin. Hgb has drifted down slightly.   PFTs with FEV1 0.94L  cMRI EF 24% with only small scar in apex  Objective:   Weight Range: 58.1 kg (128 lb) Body mass index is 19.46 kg/m.   Vital Signs:   Temp:  [97.3 F (36.3 C)-98.8 F (37.1 C)] 97.3 F (36.3 C) (07/04 0816) Pulse Rate:  [0-102] 86 (07/04 0600) Resp:  [14-34] 22 (07/04 0600) BP: (99-119)/(53-69) 108/55 (07/04 0600) SpO2:  [0 %-100 %] 95 % (07/04 0600) Weight:  [58.1 kg (128 lb)] 58.1 kg (128 lb) (07/04 0500) Last BM Date:  (PTA)  Weight change: Filed Weights   12/04/16 0416 12/05/16 0500 12/06/16 0500  Weight: 59.5 kg (131 lb 2.8 oz) 59 kg (130 lb) 58.1 kg (128 lb)    Intake/Output:   Intake/Output Summary (Last 24 hours) at 12/06/16 0918 Last data filed at 12/06/16 0800  Gross per 24 hour  Intake           623.85 ml  Output              850 ml  Net          -226.15 ml      Physical Exam    General:  Well appearing. No resp difficulty HEENT: normal Neck: supple. JVP flat. Carotids 2+ bilat; no bruits. No lymphadenopathy or thryomegaly appreciated. Cor: PMI nondisplaced. Regular rate & rhythm. No rubs, gallops or murmurs. Lungs: clear with decreased breath sounds Abdomen: soft, nontender, nondistended. No hepatosplenomegaly. No bruits or masses. Good bowel sounds. Extremities: no cyanosis, clubbing, rash, edema Neuro: alert & orientedx3, cranial nerves grossly intact. moves all 4 extremities w/o difficulty. Affect pleasant    Telemetry   NSR 80-90s Personally reviewed   EKG    EKG from 12/03/16 personally reviewed.   Labs    CBC  Recent Labs  12/03/16 1748  12/05/16 0215 12/06/16 0002  WBC 11.0*  < > 9.9 6.5  NEUTROABS 9.3*  --   --   --   HGB 11.0*  < > 10.5* 10.0*  HCT 32.5*  <  > 32.2* 31.4*  MCV 88.3  < > 88.2 89.2  PLT 301  < > 281 286  < > = values in this interval not displayed. Basic Metabolic Panel  Recent Labs  12/05/16 0215 12/06/16 0002  NA 138 135  K 4.3 3.9  CL 102 105  CO2 27 23  GLUCOSE 117* 104*  BUN 11 16  CREATININE 0.63 0.62  CALCIUM 8.2* 8.2*   Liver Function Tests  Recent Labs  12/03/16 1748 12/04/16 0320  AST 75* 57*  ALT 20 20  ALKPHOS 84 85  BILITOT 0.7 0.5  PROT 6.8 6.2*  ALBUMIN 3.7 3.3*   No results for input(s): LIPASE, AMYLASE in the last 72 hours. Cardiac Enzymes  Recent Labs  12/03/16 2212 12/04/16 0320 12/04/16 0844  TROPONINI 6.84* 6.55* 4.91*    BNP: BNP (last 3 results)  Recent Labs  12/03/16 1748 12/03/16 2212  BNP 2,721.4* 2,339.4*    ProBNP (last 3 results) No results for input(s): PROBNP in the last 8760 hours.   D-Dimer No results for input(s): DDIMER in the last 72 hours. Hemoglobin A1C  Recent Labs  12/03/16 2212  HGBA1C 5.3  Fasting Lipid Panel  Recent Labs  12/06/16 0002  CHOL 144  HDL 32*  LDLCALC 89  TRIG 076  CHOLHDL 4.5   Thyroid Function Tests  Recent Labs  12/06/16 0002  TSH 4.875*    Other results:   Imaging    No results found.   Medications:     Scheduled Medications: . aspirin EC  81 mg Oral Daily  . atorvastatin  80 mg Oral q1800  . buPROPion  300 mg Oral Daily  . carvedilol  3.125 mg Oral BID WC  . digoxin  0.125 mg Oral Daily  . LORazepam  1 mg Intravenous Once  . losartan  25 mg Oral Daily  . sertraline  200 mg Oral Daily  . sodium chloride flush  3 mL Intravenous Q12H  . sodium chloride flush  3 mL Intravenous Q12H  . sodium chloride flush  3 mL Intravenous Q12H  . spironolactone  25 mg Oral Daily    Infusions: . sodium chloride    . sodium chloride 250 mL (12/06/16 0000)  . heparin 1,550 Units/hr (12/06/16 0600)    PRN Medications: sodium chloride, sodium chloride, acetaminophen, ondansetron (ZOFRAN) IV,  pneumococcal 23 valent vaccine, sodium chloride flush, sodium chloride flush, traMADol    Patient Profile   Latoya Adams is a 71 y/o woman with h/o HTN and tobacco abuse admitted 7/1 with NSTEMI (peak trop 7.95) and acute HF found to have critical proximal LAD lesion with EF 20-25%.   Assessment/Plan   1. NSTEMI/CAD -- Cath films reviewed she has critical pLAD disease with ulcerated plaque at bifurcation with D1 as well as RPDA 90% lesion -- Chest pressure resolved. Troponin peaked at 8.24 and has trended down since.  -- Continue ASA, statin, b-blocker, heparin -- Pending CABG Friday -- RHC ok. PFTs with severe COPD. Carotid u/s pending  2. Acute systolic HF -- Echo EF 20-25% with normal RV -- cMRI EF 24% with only small scar in apex. RHC with normal CO and no PAH -- Suspect EF will recover with CABG -- Continue spiro 25 mg, digoxin 0.125 mg and losartan 25 -- Holding lasix with low filling pressures  3. PAD -- Severe aorto-iliac disease but denies claudication. Good DP pulses on exam -- Has left carotid bruit. U/S pending  4. HTN -- Blood pressure well controlled. Continue current regimen.  5. Tobacco use/COPD -- PFTs show severe COPD. Continue pulmonary toilet. IS provided  6. Hypokalemia -- 3.9 today. Supp as needed  Length of Stay: 3  Arvilla Meres, MD  12/06/2016, 9:18 AM  Advanced Heart Failure Team Pager [phone] (M-F; 7a - 4p)  Please contact [facility] for night-coverage after hours (4p -7a ) and weekends on [email or web address]

## 2016-12-06 NOTE — Progress Notes (Signed)
ANTICOAGULATION CONSULT NOTE - Follow Up Consult  Pharmacy Consult for Heparin  Indication: CAD awaiting CABG  Allergies  Allergen Reactions  . Codeine Hives   Patient Measurements: Height: 5\' 8"  (172.7 cm) Weight: 130 lb (59 kg) IBW/kg (Calculated) : 63.9  Vital Signs: Temp: 98.7 F (37.1 C) (07/04 0000) Temp Source: Oral (07/04 0000) BP: 100/61 (07/04 0000) Pulse Rate: 86 (07/04 0000)  Labs:  Recent Labs  12/03/16 2212 12/04/16 0320 12/04/16 0844  12/05/16 0215 12/05/16 0847 12/05/16 1810 12/06/16 0002  HGB  --  10.8*  --   --  10.5*  --   --  10.0*  HCT  --  32.6*  --   --  32.2*  --   --  31.4*  PLT  --  285  --   --  281  --   --  286  LABPROT  --  15.3*  --   --   --   --   --   --   INR  --  1.20  --   --   --   --   --   --   HEPARINUNFRC  --   --   --   < > <0.10* 0.23* 0.10* 0.19*  CREATININE  --  0.60 0.59  --  0.63  --   --  0.62  TROPONINI 6.84* 6.55* 4.91*  --   --   --   --   --   < > = values in this interval not displayed.  Estimated Creatinine Clearance: 62.7 mL/min (by C-G formula based on SCr of 0.62 mg/dL).  Assessment: Multi-vessel CAD awaiting CABG on Friday, heparin level sub-therapeutic after re-start due to holding for RHC 7/3  Goal of Therapy:  Heparin level 0.3-0.7 units/ml Monitor platelets by anticoagulation protocol: Yes   Plan:  -Inc heparin to 1550 units/hr -0900 HL  Abran Duke 12/06/2016,1:13 AM

## 2016-12-07 ENCOUNTER — Encounter (HOSPITAL_COMMUNITY): Payer: Medicare PPO

## 2016-12-07 ENCOUNTER — Encounter (HOSPITAL_COMMUNITY): Payer: Self-pay | Admitting: Internal Medicine

## 2016-12-07 ENCOUNTER — Inpatient Hospital Stay (HOSPITAL_COMMUNITY): Payer: Medicare PPO

## 2016-12-07 DIAGNOSIS — Z01810 Encounter for preprocedural cardiovascular examination: Secondary | ICD-10-CM

## 2016-12-07 LAB — VAS US DOPPLER PRE CABG
LEFT ECA DIAS: -14 cm/s
LEFT VERTEBRAL DIAS: 20 cm/s
Left CCA dist dias: 21 cm/s
Left CCA dist sys: 81 cm/s
Left CCA prox dias: 25 cm/s
Left CCA prox sys: 93 cm/s
Left ICA dist dias: -21 cm/s
Left ICA dist sys: -76 cm/s
Left ICA prox dias: -34 cm/s
Left ICA prox sys: -103 cm/s
RCCADSYS: -93 cm/s
RCCAPDIAS: 24 cm/s
RCCAPSYS: 91 cm/s
RIGHT ECA DIAS: -15 cm/s
RIGHT VERTEBRAL DIAS: 13 cm/s

## 2016-12-07 LAB — CBC
HEMATOCRIT: 31.5 % — AB (ref 36.0–46.0)
HEMOGLOBIN: 10.1 g/dL — AB (ref 12.0–15.0)
MCH: 28.2 pg (ref 26.0–34.0)
MCHC: 32.1 g/dL (ref 30.0–36.0)
MCV: 88 fL (ref 78.0–100.0)
Platelets: 322 10*3/uL (ref 150–400)
RBC: 3.58 MIL/uL — AB (ref 3.87–5.11)
RDW: 13.9 % (ref 11.5–15.5)
WBC: 4.7 10*3/uL (ref 4.0–10.5)

## 2016-12-07 LAB — PROTIME-INR
INR: 1
Prothrombin Time: 13.2 seconds (ref 11.4–15.2)

## 2016-12-07 LAB — BASIC METABOLIC PANEL
Anion gap: 9 (ref 5–15)
BUN: 11 mg/dL (ref 6–20)
CHLORIDE: 107 mmol/L (ref 101–111)
CO2: 24 mmol/L (ref 22–32)
CREATININE: 0.68 mg/dL (ref 0.44–1.00)
Calcium: 8.5 mg/dL — ABNORMAL LOW (ref 8.9–10.3)
GFR calc Af Amer: >60 mL/min (ref >60)
GFR calc non Af Amer: >60 mL/min (ref >60)
Glucose, Bld: 104 mg/dL — ABNORMAL HIGH (ref 65–99)
POTASSIUM: 4.4 mmol/L (ref 3.5–5.1)
Sodium: 140 mmol/L (ref 135–145)

## 2016-12-07 LAB — GLUCOSE, CAPILLARY: GLUCOSE-CAPILLARY: 112 mg/dL — AB (ref 65–99)

## 2016-12-07 LAB — PREPARE RBC (CROSSMATCH)

## 2016-12-07 LAB — HEPARIN LEVEL (UNFRACTIONATED): Heparin Unfractionated: 0.38 IU/mL (ref 0.30–0.70)

## 2016-12-07 LAB — HEMOGLOBIN A1C
HEMOGLOBIN A1C: 5.4 % (ref 4.8–5.6)
MEAN PLASMA GLUCOSE: 108 mg/dL

## 2016-12-07 LAB — ABO/RH: ABO/RH(D): B NEG

## 2016-12-07 MED ORDER — DIAZEPAM 2 MG PO TABS
2.0000 mg | ORAL_TABLET | Freq: Once | ORAL | Status: AC
  Administered 2016-12-08: 2 mg via ORAL
  Filled 2016-12-07: qty 1

## 2016-12-07 MED ORDER — SODIUM CHLORIDE 0.9 % IV SOLN
INTRAVENOUS | Status: DC
  Filled 2016-12-07 (×2): qty 1

## 2016-12-07 MED ORDER — CHLORHEXIDINE GLUCONATE 0.12 % MT SOLN
15.0000 mL | Freq: Once | OROMUCOSAL | Status: AC
  Administered 2016-12-08: 15 mL via OROMUCOSAL
  Filled 2016-12-07: qty 15

## 2016-12-07 MED ORDER — DEXTROSE 5 % IV SOLN
0.0000 ug/min | INTRAVENOUS | Status: DC
  Filled 2016-12-07 (×2): qty 4

## 2016-12-07 MED ORDER — TRANEXAMIC ACID (OHS) BOLUS VIA INFUSION
15.0000 mg/kg | INTRAVENOUS | Status: AC
  Administered 2016-12-08: 871.5 mg via INTRAVENOUS
  Filled 2016-12-07: qty 872

## 2016-12-07 MED ORDER — DEXMEDETOMIDINE HCL IN NACL 400 MCG/100ML IV SOLN
0.1000 ug/kg/h | INTRAVENOUS | Status: DC
  Filled 2016-12-07 (×2): qty 100

## 2016-12-07 MED ORDER — DEXTROSE 5 % IV SOLN
1.5000 g | INTRAVENOUS | Status: AC
  Administered 2016-12-08: .75 g via INTRAVENOUS
  Administered 2016-12-08: 1.5 g via INTRAVENOUS
  Filled 2016-12-07 (×2): qty 1.5

## 2016-12-07 MED ORDER — METOPROLOL TARTRATE 12.5 MG HALF TABLET
12.5000 mg | ORAL_TABLET | Freq: Once | ORAL | Status: AC
  Administered 2016-12-08: 12.5 mg via ORAL
  Filled 2016-12-07: qty 1

## 2016-12-07 MED ORDER — PHENYLEPHRINE HCL 10 MG/ML IJ SOLN
30.0000 ug/min | INTRAMUSCULAR | Status: DC
  Filled 2016-12-07 (×2): qty 2

## 2016-12-07 MED ORDER — CHLORHEXIDINE GLUCONATE 4 % EX LIQD
60.0000 mL | Freq: Once | CUTANEOUS | Status: AC
  Administered 2016-12-08: 4 via TOPICAL
  Filled 2016-12-07: qty 60

## 2016-12-07 MED ORDER — POTASSIUM CHLORIDE 2 MEQ/ML IV SOLN
80.0000 meq | INTRAVENOUS | Status: DC
  Filled 2016-12-07 (×2): qty 40

## 2016-12-07 MED ORDER — DOPAMINE-DEXTROSE 3.2-5 MG/ML-% IV SOLN
0.0000 ug/kg/min | INTRAVENOUS | Status: DC
  Filled 2016-12-07: qty 250

## 2016-12-07 MED ORDER — SODIUM CHLORIDE 0.9 % IV SOLN
INTRAVENOUS | Status: DC
  Filled 2016-12-07 (×2): qty 30

## 2016-12-07 MED ORDER — CHLORHEXIDINE GLUCONATE 4 % EX LIQD
60.0000 mL | Freq: Once | CUTANEOUS | Status: AC
  Administered 2016-12-07: 4 via TOPICAL
  Filled 2016-12-07: qty 60

## 2016-12-07 MED ORDER — TRANEXAMIC ACID 1000 MG/10ML IV SOLN
1.5000 mg/kg/h | INTRAVENOUS | Status: AC
  Administered 2016-12-08: 1.5 mg/kg/h via INTRAVENOUS
  Filled 2016-12-07 (×2): qty 25

## 2016-12-07 MED ORDER — VANCOMYCIN HCL 10 G IV SOLR
1250.0000 mg | INTRAVENOUS | Status: AC
  Administered 2016-12-08: 1250 mg via INTRAVENOUS
  Filled 2016-12-07 (×2): qty 1250

## 2016-12-07 MED ORDER — TRANEXAMIC ACID (OHS) PUMP PRIME SOLUTION
2.0000 mg/kg | INTRAVENOUS | Status: DC
  Filled 2016-12-07 (×2): qty 1.16

## 2016-12-07 MED ORDER — NITROGLYCERIN IN D5W 200-5 MCG/ML-% IV SOLN
2.0000 ug/min | INTRAVENOUS | Status: AC
  Administered 2016-12-08: 16.67 ug/min via INTRAVENOUS
  Filled 2016-12-07: qty 250

## 2016-12-07 MED ORDER — PLASMA-LYTE 148 IV SOLN
INTRAVENOUS | Status: AC
  Administered 2016-12-08: 500 mL
  Filled 2016-12-07 (×2): qty 2.5

## 2016-12-07 MED ORDER — DEXTROSE 5 % IV SOLN
750.0000 mg | INTRAVENOUS | Status: DC
  Filled 2016-12-07 (×2): qty 750

## 2016-12-07 MED ORDER — BISACODYL 5 MG PO TBEC
5.0000 mg | DELAYED_RELEASE_TABLET | Freq: Once | ORAL | Status: AC
  Administered 2016-12-07: 5 mg via ORAL
  Filled 2016-12-07: qty 1

## 2016-12-07 MED ORDER — TEMAZEPAM 15 MG PO CAPS: 15.0000 mg | ORAL_CAPSULE | Freq: Once | ORAL | Status: DC | PRN

## 2016-12-07 MED ORDER — MAGNESIUM SULFATE 50 % IJ SOLN
40.0000 meq | INTRAMUSCULAR | Status: DC
  Filled 2016-12-07 (×2): qty 10

## 2016-12-07 NOTE — Progress Notes (Signed)
ANTICOAGULATION CONSULT NOTE - Follow Up Consult  Pharmacy Consult for Heparin Indication: CAD awaiting CABG  Allergies  Allergen Reactions  . Codeine Hives    Patient Measurements: Height: 5\' 8"  (172.7 cm) Weight: 128 lb (58.1 kg) IBW/kg (Calculated) : 63.9 Heparin Dosing Weight: 59.5 kg  Vital Signs: Temp: 97.8 F (36.6 C) (07/05 1124) Temp Source: Oral (07/05 1124) BP: 109/62 (07/05 1100) Pulse Rate: 82 (07/05 1100)  Labs:  Recent Labs  12/05/16 0215  12/06/16 0002 12/06/16 0613 12/07/16 0831  HGB 10.5*  --  10.0*  --  10.1*  HCT 32.2*  --  31.4*  --  31.5*  PLT 281  --  286  --  322  LABPROT  --   --   --   --  13.2  INR  --   --   --   --  1.00  HEPARINUNFRC <0.10*  < > 0.19* 0.38 0.38  CREATININE 0.63  --  0.62  --  0.68  < > = values in this interval not displayed.  Estimated Creatinine Clearance: 61.7 mL/min (by C-G formula based on SCr of 0.68 mg/dL).  Medications: Heparin @ 1550 units/hr  Assessment: 71 yo female s/p NSTEMI, cath revealed severe triple vessel CAD. Plan is for CABG tomorrow. Heparin level therapeutic at 0.38. CBC stable. No bleeding.  Goal of Therapy:  Heparin level 0.3-0.7 units/ml Monitor platelets by anticoagulation protocol: Yes   Plan:  1) Continue heparin at 1550 units/hr 2) Follow up after CABG 7/6  Fredrik Rigger 12/07/2016,11:50 AM

## 2016-12-07 NOTE — Progress Notes (Signed)
Pt walked 370 ft earlier this am. Sts she gets SOB. Eating breakfast now, sts she will walk later today. Discussed sternal precautions, IS, mobility post op and d/c planning. Very receptive. Gave her OHS booklet, careguide, and videos to watch. She will talk to her family about d/c planning. [id number] Latoya Adams CES, ACSM 9:19 AM 12/07/2016

## 2016-12-07 NOTE — Progress Notes (Signed)
Advanced Heart Failure Rounding Note  Primary Cardiologist: Excell Seltzer  Subjective:    RHC 7/3 with normal output. Low filling pressures. No PAH. Feels good. No CP or SOB. SBP 100-110.   Remains on heparin. CBC pending.  No overt bleeding.   Feels OK. No further chest pain.  Walked the halls. SOB after walking about half way around the unit. Mild chest "discomfort" with SOB, but no overt chest pain.   PFTs with FEV1 0.94L  cMRI EF 24% with only small scar in apex  Objective:   Weight Range: 128 lb (58.1 kg) Body mass index is 19.46 kg/m.   Vital Signs:   Temp:  [97.3 F (36.3 C)-98.4 F (36.9 C)] 98.2 F (36.8 C) (07/05 0726) Pulse Rate:  [82-94] 83 (07/05 0700) Resp:  [12-27] 23 (07/05 0700) BP: (97-125)/(40-75) 117/67 (07/05 0700) SpO2:  [91 %-100 %] 94 % (07/05 0700) Weight:  [128 lb (58.1 kg)] 128 lb (58.1 kg) (07/05 0500) Last BM Date:  (PTA)  Weight change: Filed Weights   12/05/16 0500 12/06/16 0500 12/07/16 0500  Weight: 130 lb (59 kg) 128 lb (58.1 kg) 128 lb (58.1 kg)    Intake/Output:   Intake/Output Summary (Last 24 hours) at 12/07/16 0748 Last data filed at 12/07/16 0700  Gross per 24 hour  Intake           1430.5 ml  Output                0 ml  Net           1430.5 ml      Physical Exam    General: Well appearing. No resp difficulty. HEENT: Normal Neck: Supple. JVP 5-6. Carotids 2+ bilat; no bruits. No thyromegaly or nodule noted. Cor: PMI nondisplaced. RRR, No M/G/R noted Lungs: CTAB, normal effort. Abdomen: Soft, non-tender, non-distended, no HSM. No bruits or masses. +BS  Extremities: No cyanosis, clubbing, or rash. Trace ankle edema.  Neuro: Alert & orientedx3, cranial nerves grossly intact. moves all 4 extremities w/o difficulty. Affect pleasant   Telemetry   Personally reviewed, NSR 80-90s    EKG    EKG from 12/05/16 with sinus tach, Personally reviewed.   Labs    CBC  Recent Labs  12/05/16 0215 12/06/16 0002  WBC 9.9  6.5  HGB 10.5* 10.0*  HCT 32.2* 31.4*  MCV 88.2 89.2  PLT 281 286   Basic Metabolic Panel  Recent Labs  12/05/16 0215 12/06/16 0002  NA 138 135  K 4.3 3.9  CL 102 105  CO2 27 23  GLUCOSE 117* 104*  BUN 11 16  CREATININE 0.63 0.62  CALCIUM 8.2* 8.2*   Liver Function Tests No results for input(s): AST, ALT, ALKPHOS, BILITOT, PROT, ALBUMIN in the last 72 hours. No results for input(s): LIPASE, AMYLASE in the last 72 hours. Cardiac Enzymes  Recent Labs  12/04/16 0844  TROPONINI 4.91*    BNP: BNP (last 3 results)  Recent Labs  12/03/16 1748 12/03/16 2212  BNP 2,721.4* 2,339.4*    ProBNP (last 3 results) No results for input(s): PROBNP in the last 8760 hours.   D-Dimer No results for input(s): DDIMER in the last 72 hours. Hemoglobin A1C  Recent Labs  12/06/16 0002  HGBA1C 5.4   Fasting Lipid Panel  Recent Labs  12/06/16 0002  CHOL 144  HDL 32*  LDLCALC 89  TRIG 161  CHOLHDL 4.5   Thyroid Function Tests  Recent Labs  12/06/16 0002  TSH  4.875*    Other results:   Imaging    No results found.   Medications:     Scheduled Medications: . aspirin EC  81 mg Oral Daily  . atorvastatin  80 mg Oral q1800  . buPROPion  300 mg Oral Daily  . carvedilol  3.125 mg Oral BID WC  . digoxin  0.125 mg Oral Daily  . LORazepam  1 mg Intravenous Once  . losartan  25 mg Oral Daily  . sertraline  200 mg Oral Daily  . sodium chloride flush  3 mL Intravenous Q12H  . sodium chloride flush  3 mL Intravenous Q12H  . sodium chloride flush  3 mL Intravenous Q12H  . spironolactone  25 mg Oral Daily    Infusions: . sodium chloride    . sodium chloride 250 mL (12/06/16 0000)  . heparin 1,550 Units/hr (12/07/16 0700)    PRN Medications: sodium chloride, sodium chloride, acetaminophen, ondansetron (ZOFRAN) IV, pneumococcal 23 valent vaccine, sodium chloride flush, sodium chloride flush, traMADol    Patient Profile   Ms. Vonderhaar is a 71 y/o woman  with h/o HTN and tobacco abuse admitted 7/1 with NSTEMI (peak trop 7.95) and acute HF found to have critical proximal LAD lesion with EF 20-25%.   Assessment/Plan   1. NSTEMI/CAD -- Cath films reviewed she has critical pLAD disease with ulcerated plaque at bifurcation with D1 as well as RPDA 90% lesion -- No further chest pain. Troponin peaked at 8.24 and has trended down since.  -- Continue ASA, statin, b-blocker, heparin -- Pending CABG tomorrow.  -- RHC ok. PFTs with severe COPD. Carotid u/s pending.   2. Acute systolic HF -- Echo EF 20-25% with normal RV -- cMRI EF 24% with only small scar in apex. RHC with normal CO and no PAH -- Suspect EF will recover with CABG -- Continue spiro 25 mg, digoxin 0.125 mg and losartan 25 -- Holding lasix with low filling pressures. Weight stable. Would not give lasix today.    3. PAD -- Severe aorto-iliac disease but denies claudication. Good DP pulses on exam.  -- Has left carotid bruit. U/S pending. No change.   4. HTN -- Controlled on current regimen. SBP ~ 110-120s  5. Tobacco use/COPD -- PFTs show severe COPD. Continue pulmonary toilet. IS provided.   -- Stable. No change.   6. Hypokalemia -- Labs pending today.  Supp as needed.   Length of Stay: 4  Luane School  12/07/2016, 7:48 AM  Advanced Heart Failure Team Pager [phone] (M-F; 7a - 4p)  Please contact [facility] for night-coverage after hours (4p -7a ) and weekends on [email or web address]  Patient seen and examined with the above-signed Advanced Practice Provider and/or Housestaff. I personally reviewed laboratory data, imaging studies and relevant notes. I independently examined the patient and formulated the important aspects of the plan. I have edited the note to reflect any of my changes or salient points. I have personally discussed the plan with the patient and/or family.  She is doing well today. Did have minimal angina when she tried to walk ward quickly  yesterday but it has resolved. BP and HR improved. Volume status and RHC ok. Carotid dopplers reviewed personally 1-39% bilaterally. To OR in am with Dr. Donata Clay for CABG.   Arvilla Meres, MD  7:53 PM

## 2016-12-07 NOTE — Progress Notes (Signed)
Pre-op Cardiac Surgery  Carotid Findings:  Mild plaque in bilateral carotid arteries suggestive 1-39% stenosis.  Upper Extremity Right Left  Brachial Pressures  109  Radial Waveforms Triphasic Triphasic  Ulnar Waveforms Triphasic Triphasic  Palmar Arch (Allen's Test) WNL WNL   Findings:    Lower  Extremity Right Left  Dorsalis Pedis triphasic triphasic  Anterior Tibial Triphasic Triphasic  Posterior Tibial Triphasic Triphasic  Ankle/Brachial Indices      Findings:  Doppler waveforms appear normal at rest.  Marilynne Halsted, BS, RDMS, RVT

## 2016-12-07 NOTE — Progress Notes (Signed)
2 Days Post-Op Procedure(s) (LRB): Right Heart Cath (N/A) Subjective: The patient presented with a completed non-ST elevation MI with severe multivessel coronary disease and ejection fraction of 20%. She had peripheral edema on presentation.  After diuresis and treatment with heparin and nitroglycerin her symptoms improved Follow-up right heart cath by Dr. [name] demonstrates adequate cardiac output and low right heart pressures Cardiac viability study shows minimal subendocardial scar Patient is to undergo multivessel CABG for her ischemic cardiomyopathy in a.m. I discussed the procedure in detail including benefits and risks and she agrees to proceed.  Objective: Vital signs in last 24 hours: Temp:  [97.6 F (36.4 C)-98.2 F (36.8 C)] 97.6 F (36.4 C) (07/05 1632) Pulse Rate:  [79-94] 82 (07/05 1800) Cardiac Rhythm: Normal sinus rhythm (07/05 1600) Resp:  [12-29] 16 (07/05 1800) BP: (95-128)/(45-75) 111/74 (07/05 1800) SpO2:  [91 %-100 %] 98 % (07/05 1800) Weight:  [128 lb (58.1 kg)] 128 lb (58.1 kg) (07/05 0500)  Hemodynamic parameters for last 24 hours:    Intake/Output from previous day: 07/04 0701 - 07/05 0700 In: 1740.5 [P.O.:1040; I.V.:700.5] Out: -  Intake/Output this shift: Total I/O In: 1170.5 [P.O.:890; I.V.:280.5] Out: -   Lungs clear Regular rhythm No hematoma at cath site  Lab Results:  Recent Labs  12/06/16 0002 12/07/16 0831  WBC 6.5 4.7  HGB 10.0* 10.1*  HCT 31.4* 31.5*  PLT 286 322   BMET:  Recent Labs  12/06/16 0002 12/07/16 0831  NA 135 140  K 3.9 4.4  CL 105 107  CO2 23 24  GLUCOSE 104* 104*  BUN 16 11  CREATININE 0.62 0.68  CALCIUM 8.2* 8.5*    PT/INR:  Recent Labs  12/07/16 0831  LABPROT 13.2  INR 1.00   ABG    Component Value Date/Time   HCO3 25.4 12/05/2016 1638   TCO2 26 12/05/2016 1638   O2SAT 55.0 12/05/2016 1638   CBG (last 3)   Recent Labs  12/05/16 0744 12/06/16 0811 12/07/16 0724  GLUCAP 121* 89  112*    Assessment/Plan: S/P Procedure(s) (LRB): Right Heart Cath (N/A) Patient understands that with preoperative anemia there is a significant chance she will require blood transfusion during surgery in a.m. Procedure reviewed and all license addressed.   LOS: 4 days    Kathlee Nations Trigt III 12/07/2016

## 2016-12-08 ENCOUNTER — Inpatient Hospital Stay (HOSPITAL_COMMUNITY): Payer: Medicare PPO | Admitting: Certified Registered Nurse Anesthetist

## 2016-12-08 ENCOUNTER — Encounter (HOSPITAL_COMMUNITY)
Admission: RE | Disposition: A | Discharge status: Skilled Nursing Facility | Payer: Self-pay | Attending: Cardiothoracic Surgery

## 2016-12-08 ENCOUNTER — Inpatient Hospital Stay (HOSPITAL_COMMUNITY): Payer: Medicare PPO

## 2016-12-08 DIAGNOSIS — Z951 Presence of aortocoronary bypass graft: Secondary | ICD-10-CM

## 2016-12-08 DIAGNOSIS — I2510 Atherosclerotic heart disease of native coronary artery without angina pectoris: Secondary | ICD-10-CM

## 2016-12-08 HISTORY — PX: CORONARY ARTERY BYPASS GRAFT: SHX141

## 2016-12-08 HISTORY — PX: TEE WITHOUT CARDIOVERSION: SHX5443

## 2016-12-08 LAB — CREATININE, SERUM
Creatinine, Ser: 0.58 mg/dL (ref 0.44–1.00)
GFR calc Af Amer: >60 mL/min (ref >60)
GFR calc non Af Amer: >60 mL/min (ref >60)

## 2016-12-08 LAB — CBC
HCT: 27.2 % — ABNORMAL LOW (ref 36.0–46.0)
HCT: 27.6 % — ABNORMAL LOW (ref 36.0–46.0)
Hemoglobin: 8.9 g/dL — ABNORMAL LOW (ref 12.0–15.0)
Hemoglobin: 8.9 g/dL — ABNORMAL LOW (ref 12.0–15.0)
MCH: 28.7 pg (ref 26.0–34.0)
MCH: 27.8 pg (ref 26.0–34.0)
MCHC: 32.7 g/dL (ref 30.0–36.0)
MCHC: 32.2 g/dL (ref 30.0–36.0)
MCV: 87.7 fL (ref 78.0–100.0)
MCV: 86.3 fL (ref 78.0–100.0)
PLATELETS: 137 10*3/uL — AB (ref 150–400)
Platelets: 139 10*3/uL — ABNORMAL LOW (ref 150–400)
RBC: 3.1 MIL/uL — AB (ref 3.87–5.11)
RBC: 3.2 MIL/uL — ABNORMAL LOW (ref 3.87–5.11)
RDW: 14.9 % (ref 11.5–15.5)
RDW: 14.6 % (ref 11.5–15.5)
WBC: 8.3 10*3/uL (ref 4.0–10.5)
WBC: 9.6 10*3/uL (ref 4.0–10.5)

## 2016-12-08 LAB — POCT I-STAT, CHEM 8
BUN: 9 mg/dL (ref 6–20)
BUN: 9 mg/dL (ref 6–20)
BUN: 10 mg/dL (ref 6–20)
BUN: 9 mg/dL (ref 6–20)
BUN: 9 mg/dL (ref 6–20)
BUN: 10 mg/dL (ref 6–20)
BUN: 10 mg/dL (ref 6–20)
BUN: 11 mg/dL (ref 6–20)
CALCIUM ION: 1.18 mmol/L (ref 1.15–1.40)
CALCIUM ION: 1.22 mmol/L (ref 1.15–1.40)
CALCIUM ION: 1.2 mmol/L (ref 1.15–1.40)
CHLORIDE: 107 mmol/L (ref 101–111)
CHLORIDE: 106 mmol/L (ref 101–111)
CREATININE: 0.6 mg/dL (ref 0.44–1.00)
CREATININE: 0.4 mg/dL — AB (ref 0.44–1.00)
CREATININE: 0.3 mg/dL — AB (ref 0.44–1.00)
Calcium, Ion: 1.16 mmol/L (ref 1.15–1.40)
Calcium, Ion: 1 mmol/L — ABNORMAL LOW (ref 1.15–1.40)
Calcium, Ion: 1.11 mmol/L — ABNORMAL LOW (ref 1.15–1.40)
Calcium, Ion: 1.1 mmol/L — ABNORMAL LOW (ref 1.15–1.40)
Calcium, Ion: 1.42 mmol/L — ABNORMAL HIGH (ref 1.15–1.40)
Chloride: 106 mmol/L (ref 101–111)
Chloride: 104 mmol/L (ref 101–111)
Chloride: 107 mmol/L (ref 101–111)
Chloride: 107 mmol/L (ref 101–111)
Chloride: 106 mmol/L (ref 101–111)
Chloride: 106 mmol/L (ref 101–111)
Creatinine, Ser: 0.4 mg/dL — ABNORMAL LOW (ref 0.44–1.00)
Creatinine, Ser: 0.3 mg/dL — ABNORMAL LOW (ref 0.44–1.00)
Creatinine, Ser: 0.3 mg/dL — ABNORMAL LOW (ref 0.44–1.00)
Creatinine, Ser: 0.3 mg/dL — ABNORMAL LOW (ref 0.44–1.00)
Creatinine, Ser: 0.5 mg/dL (ref 0.44–1.00)
GLUCOSE: 105 mg/dL — AB (ref 65–99)
GLUCOSE: 97 mg/dL (ref 65–99)
GLUCOSE: 117 mg/dL — AB (ref 65–99)
Glucose, Bld: 100 mg/dL — ABNORMAL HIGH (ref 65–99)
Glucose, Bld: 110 mg/dL — ABNORMAL HIGH (ref 65–99)
Glucose, Bld: 142 mg/dL — ABNORMAL HIGH (ref 65–99)
Glucose, Bld: 140 mg/dL — ABNORMAL HIGH (ref 65–99)
Glucose, Bld: 114 mg/dL — ABNORMAL HIGH (ref 65–99)
HCT: 26 % — ABNORMAL LOW (ref 36.0–46.0)
HCT: 24 % — ABNORMAL LOW (ref 36.0–46.0)
HCT: 24 % — ABNORMAL LOW (ref 36.0–46.0)
HEMATOCRIT: 24 % — AB (ref 36.0–46.0)
HEMATOCRIT: 25 % — AB (ref 36.0–46.0)
HEMATOCRIT: 24 % — AB (ref 36.0–46.0)
HEMATOCRIT: 24 % — AB (ref 36.0–46.0)
HEMATOCRIT: 21 % — AB (ref 36.0–46.0)
HEMOGLOBIN: 8.2 g/dL — AB (ref 12.0–15.0)
HEMOGLOBIN: 8.2 g/dL — AB (ref 12.0–15.0)
HEMOGLOBIN: 8.5 g/dL — AB (ref 12.0–15.0)
HEMOGLOBIN: 8.2 g/dL — AB (ref 12.0–15.0)
HEMOGLOBIN: 8.2 g/dL — AB (ref 12.0–15.0)
Hemoglobin: 8.8 g/dL — ABNORMAL LOW (ref 12.0–15.0)
Hemoglobin: 7.1 g/dL — ABNORMAL LOW (ref 12.0–15.0)
Hemoglobin: 8.2 g/dL — ABNORMAL LOW (ref 12.0–15.0)
POTASSIUM: 4.3 mmol/L (ref 3.5–5.1)
POTASSIUM: 5 mmol/L (ref 3.5–5.1)
POTASSIUM: 5.7 mmol/L — AB (ref 3.5–5.1)
POTASSIUM: 4.7 mmol/L (ref 3.5–5.1)
Potassium: 4.1 mmol/L (ref 3.5–5.1)
Potassium: 4.5 mmol/L (ref 3.5–5.1)
Potassium: 5.6 mmol/L — ABNORMAL HIGH (ref 3.5–5.1)
Potassium: 4.1 mmol/L (ref 3.5–5.1)
SODIUM: 139 mmol/L (ref 135–145)
SODIUM: 139 mmol/L (ref 135–145)
SODIUM: 139 mmol/L (ref 135–145)
SODIUM: 141 mmol/L (ref 135–145)
Sodium: 141 mmol/L (ref 135–145)
Sodium: 140 mmol/L (ref 135–145)
Sodium: 140 mmol/L (ref 135–145)
Sodium: 138 mmol/L (ref 135–145)
TCO2: 25 mmol/L (ref 0–100)
TCO2: 27 mmol/L (ref 0–100)
TCO2: 26 mmol/L (ref 0–100)
TCO2: 27 mmol/L (ref 0–100)
TCO2: 26 mmol/L (ref 0–100)
TCO2: 26 mmol/L (ref 0–100)
TCO2: 25 mmol/L (ref 0–100)
TCO2: 22 mmol/L (ref 0–100)

## 2016-12-08 LAB — POCT I-STAT 3, ART BLOOD GAS (G3+)
ACID-BASE DEFICIT: 2 mmol/L (ref 0.0–2.0)
ACID-BASE DEFICIT: 2 mmol/L (ref 0.0–2.0)
ACID-BASE DEFICIT: 3 mmol/L — AB (ref 0.0–2.0)
ACID-BASE DEFICIT: 3 mmol/L — AB (ref 0.0–2.0)
ACID-BASE EXCESS: 2 mmol/L (ref 0.0–2.0)
Acid-base deficit: 2 mmol/L (ref 0.0–2.0)
BICARBONATE: 26.7 mmol/L (ref 20.0–28.0)
BICARBONATE: 23.9 mmol/L (ref 20.0–28.0)
BICARBONATE: 23.7 mmol/L (ref 20.0–28.0)
BICARBONATE: 23.1 mmol/L (ref 20.0–28.0)
Bicarbonate: 23.3 mmol/L (ref 20.0–28.0)
Bicarbonate: 21.8 mmol/L (ref 20.0–28.0)
O2 SAT: 100 %
O2 SAT: 96 %
O2 SAT: 91 %
O2 Saturation: 100 %
O2 Saturation: 100 %
O2 Saturation: 94 %
PCO2 ART: 47.2 mmHg (ref 32.0–48.0)
PCO2 ART: 44 mmHg (ref 32.0–48.0)
PH ART: 7.439 (ref 7.350–7.450)
PH ART: 7.313 — AB (ref 7.350–7.450)
PO2 ART: 318 mmHg — AB (ref 83.0–108.0)
PO2 ART: 442 mmHg — AB (ref 83.0–108.0)
PO2 ART: 400 mmHg — AB (ref 83.0–108.0)
PO2 ART: 80 mmHg — AB (ref 83.0–108.0)
Patient temperature: 35.9
Patient temperature: 37.1
TCO2: 24 mmol/L (ref 0–100)
TCO2: 28 mmol/L (ref 0–100)
TCO2: 25 mmol/L (ref 0–100)
TCO2: 25 mmol/L (ref 0–100)
TCO2: 24 mmol/L (ref 0–100)
TCO2: 23 mmol/L (ref 0–100)
pCO2 arterial: 40.3 mmHg (ref 32.0–48.0)
pCO2 arterial: 39.4 mmHg (ref 32.0–48.0)
pCO2 arterial: 46.7 mmHg (ref 32.0–48.0)
pCO2 arterial: 38.1 mmHg (ref 32.0–48.0)
pH, Arterial: 7.37 (ref 7.350–7.450)
pH, Arterial: 7.333 — ABNORMAL LOW (ref 7.350–7.450)
pH, Arterial: 7.301 — ABNORMAL LOW (ref 7.350–7.450)
pH, Arterial: 7.366 (ref 7.350–7.450)
pO2, Arterial: 79 mmHg — ABNORMAL LOW (ref 83.0–108.0)
pO2, Arterial: 64 mmHg — ABNORMAL LOW (ref 83.0–108.0)

## 2016-12-08 LAB — SURGICAL PCR SCREEN
MRSA, PCR: NEGATIVE
Staphylococcus aureus: NEGATIVE

## 2016-12-08 LAB — POCT I-STAT 4, (NA,K, GLUC, HGB,HCT)
Glucose, Bld: 111 mg/dL — ABNORMAL HIGH (ref 65–99)
HEMATOCRIT: 26 % — AB (ref 36.0–46.0)
Hemoglobin: 8.8 g/dL — ABNORMAL LOW (ref 12.0–15.0)
Potassium: 4.2 mmol/L (ref 3.5–5.1)
SODIUM: 143 mmol/L (ref 135–145)

## 2016-12-08 LAB — MAGNESIUM: Magnesium: 3.2 mg/dL — ABNORMAL HIGH (ref 1.7–2.4)

## 2016-12-08 LAB — HEMOGLOBIN AND HEMATOCRIT, BLOOD
HCT: 26.4 % — ABNORMAL LOW (ref 36.0–46.0)
Hemoglobin: 8.6 g/dL — ABNORMAL LOW (ref 12.0–15.0)

## 2016-12-08 LAB — PROTIME-INR
INR: 1.34
Prothrombin Time: 16.7 seconds — ABNORMAL HIGH (ref 11.4–15.2)

## 2016-12-08 LAB — PLATELET COUNT: Platelets: 206 10*3/uL (ref 150–400)

## 2016-12-08 LAB — APTT: APTT: 33 s (ref 24–36)

## 2016-12-08 LAB — PREPARE RBC (CROSSMATCH)

## 2016-12-08 SURGERY — CORONARY ARTERY BYPASS GRAFTING (CABG)
Anesthesia: General | Site: Chest

## 2016-12-08 MED ORDER — SODIUM CHLORIDE 0.9 % IV SOLN
0.0000 ug/min | INTRAVENOUS | Status: DC
  Filled 2016-12-08: qty 2

## 2016-12-08 MED ORDER — SODIUM CHLORIDE 0.9 % IV SOLN
INTRAVENOUS | Status: DC | PRN
  Administered 2016-12-08: 13:00:00 via INTRAVENOUS

## 2016-12-08 MED ORDER — MIDAZOLAM HCL 10 MG/2ML IJ SOLN
INTRAMUSCULAR | Status: AC
  Filled 2016-12-08: qty 2

## 2016-12-08 MED ORDER — TRAMADOL HCL 50 MG PO TABS
50.0000 mg | ORAL_TABLET | ORAL | Status: DC | PRN
  Administered 2016-12-10 – 2016-12-14 (×3): 100 mg via ORAL
  Administered 2016-12-14: 50 mg via ORAL
  Filled 2016-12-08: qty 2
  Filled 2016-12-08: qty 1
  Filled 2016-12-08 (×2): qty 2

## 2016-12-08 MED ORDER — OXYCODONE HCL 5 MG PO TABS
5.0000 mg | ORAL_TABLET | ORAL | Status: DC | PRN
  Administered 2016-12-08 – 2016-12-09 (×6): 5 mg via ORAL
  Administered 2016-12-10: 10 mg via ORAL
  Filled 2016-12-08 (×5): qty 1
  Filled 2016-12-08 (×2): qty 2
  Filled 2016-12-08: qty 1

## 2016-12-08 MED ORDER — CEFUROXIME SODIUM 1.5 G IV SOLR
1.5000 g | Freq: Two times a day (BID) | INTRAVENOUS | Status: AC
  Administered 2016-12-08 – 2016-12-10 (×3): 1.5 g via INTRAVENOUS
  Filled 2016-12-08 (×4): qty 1.5

## 2016-12-08 MED ORDER — FENTANYL CITRATE (PF) 250 MCG/5ML IJ SOLN
INTRAMUSCULAR | Status: AC
  Filled 2016-12-08: qty 5

## 2016-12-08 MED ORDER — ORAL CARE MOUTH RINSE: 15.0000 mL | Freq: Four times a day (QID) | OROMUCOSAL | Status: DC

## 2016-12-08 MED ORDER — INSULIN REGULAR BOLUS VIA INFUSION
0.0000 [IU] | Freq: Three times a day (TID) | INTRAVENOUS | Status: DC
  Filled 2016-12-08: qty 10

## 2016-12-08 MED ORDER — METOCLOPRAMIDE HCL 5 MG/ML IJ SOLN
10.0000 mg | Freq: Four times a day (QID) | INTRAMUSCULAR | Status: DC
  Administered 2016-12-08 – 2016-12-12 (×14): 10 mg via INTRAVENOUS
  Filled 2016-12-08 (×13): qty 2

## 2016-12-08 MED ORDER — SERTRALINE HCL 100 MG PO TABS
200.0000 mg | ORAL_TABLET | Freq: Every day | ORAL | Status: DC
  Administered 2016-12-09 – 2016-12-15 (×7): 200 mg via ORAL
  Filled 2016-12-08 (×8): qty 2

## 2016-12-08 MED ORDER — MIDAZOLAM HCL 2 MG/2ML IJ SOLN: 2.0000 mg | INTRAMUSCULAR | Status: DC | PRN

## 2016-12-08 MED ORDER — HEPARIN SODIUM (PORCINE) 1000 UNIT/ML IJ SOLN
INTRAMUSCULAR | Status: AC
  Filled 2016-12-08: qty 1

## 2016-12-08 MED ORDER — SODIUM CHLORIDE 0.9 % IV SOLN
INTRAVENOUS | Status: DC
  Filled 2016-12-08: qty 1

## 2016-12-08 MED ORDER — CHLORHEXIDINE GLUCONATE 0.12% ORAL RINSE (MEDLINE KIT): 15.0000 mL | Freq: Two times a day (BID) | OROMUCOSAL | Status: DC

## 2016-12-08 MED ORDER — VANCOMYCIN HCL IN DEXTROSE 1-5 GM/200ML-% IV SOLN
1000.0000 mg | Freq: Once | INTRAVENOUS | Status: AC
  Administered 2016-12-08: 1000 mg via INTRAVENOUS
  Filled 2016-12-08: qty 200

## 2016-12-08 MED ORDER — PANTOPRAZOLE SODIUM 40 MG PO TBEC
40.0000 mg | DELAYED_RELEASE_TABLET | Freq: Every day | ORAL | Status: DC
  Administered 2016-12-10 – 2016-12-15 (×6): 40 mg via ORAL
  Filled 2016-12-08 (×6): qty 1

## 2016-12-08 MED ORDER — MILRINONE LACTATE IN DEXTROSE 20-5 MG/100ML-% IV SOLN
0.3000 ug/kg/min | INTRAVENOUS | Status: DC
  Administered 2016-12-08 – 2016-12-10 (×3): 0.3 ug/kg/min via INTRAVENOUS
  Filled 2016-12-08 (×3): qty 100

## 2016-12-08 MED ORDER — PHENYLEPHRINE 40 MCG/ML (10ML) SYRINGE FOR IV PUSH (FOR BLOOD PRESSURE SUPPORT)
PREFILLED_SYRINGE | INTRAVENOUS | Status: AC
  Filled 2016-12-08: qty 10

## 2016-12-08 MED ORDER — FAMOTIDINE IN NACL 20-0.9 MG/50ML-% IV SOLN
20.0000 mg | Freq: Two times a day (BID) | INTRAVENOUS | Status: DC
  Administered 2016-12-08: 20 mg via INTRAVENOUS

## 2016-12-08 MED ORDER — LACTATED RINGERS IV SOLN
INTRAVENOUS | Status: DC | PRN
  Administered 2016-12-08: 06:00:00 via INTRAVENOUS

## 2016-12-08 MED ORDER — SODIUM CHLORIDE 0.9 % IV SOLN
INTRAVENOUS | Status: DC
  Administered 2016-12-08: 20 mL/h via INTRAVENOUS

## 2016-12-08 MED ORDER — PHENYLEPHRINE HCL 10 MG/ML IJ SOLN
INTRAVENOUS | Status: DC | PRN
  Administered 2016-12-08: 30 ug/min via INTRAVENOUS

## 2016-12-08 MED ORDER — SODIUM CHLORIDE 0.9% FLUSH
3.0000 mL | Freq: Two times a day (BID) | INTRAVENOUS | Status: DC
  Administered 2016-12-10 – 2016-12-14 (×6): 3 mL via INTRAVENOUS

## 2016-12-08 MED ORDER — SODIUM CHLORIDE 0.9 % IJ SOLN
OROMUCOSAL | Status: DC | PRN
  Administered 2016-12-08: 4 mL via TOPICAL

## 2016-12-08 MED ORDER — PROTAMINE SULFATE 10 MG/ML IV SOLN
INTRAVENOUS | Status: DC | PRN
  Administered 2016-12-08: 50 mg via INTRAVENOUS
  Administered 2016-12-08: 30 mg via INTRAVENOUS
  Administered 2016-12-08 (×4): 50 mg via INTRAVENOUS

## 2016-12-08 MED ORDER — MIDAZOLAM HCL 5 MG/5ML IJ SOLN
INTRAMUSCULAR | Status: DC | PRN
  Administered 2016-12-08: 2 mg via INTRAVENOUS
  Administered 2016-12-08: 1 mg via INTRAVENOUS
  Administered 2016-12-08: 5 mg via INTRAVENOUS
  Administered 2016-12-08: 2 mg via INTRAVENOUS

## 2016-12-08 MED ORDER — ROCURONIUM BROMIDE 50 MG/5ML IV SOLN
INTRAVENOUS | Status: AC
  Filled 2016-12-08: qty 2

## 2016-12-08 MED ORDER — SODIUM CHLORIDE 0.45 % IV SOLN
INTRAVENOUS | Status: DC | PRN
  Administered 2016-12-08: 20 mL/h via INTRAVENOUS

## 2016-12-08 MED ORDER — MOMETASONE FURO-FORMOTEROL FUM 200-5 MCG/ACT IN AERO
2.0000 | INHALATION_SPRAY | Freq: Two times a day (BID) | RESPIRATORY_TRACT | Status: DC
Start: 2016-12-08 — End: 2016-12-15
  Administered 2016-12-08 – 2016-12-15 (×13): 2 via RESPIRATORY_TRACT
  Filled 2016-12-08: qty 8.8

## 2016-12-08 MED ORDER — 0.9 % SODIUM CHLORIDE (POUR BTL) OPTIME
TOPICAL | Status: DC | PRN
  Administered 2016-12-08: 6000 mL

## 2016-12-08 MED ORDER — LACTATED RINGERS IV SOLN
INTRAVENOUS | Status: DC | PRN
  Administered 2016-12-08 (×2): via INTRAVENOUS

## 2016-12-08 MED ORDER — FENTANYL CITRATE (PF) 250 MCG/5ML IJ SOLN
INTRAMUSCULAR | Status: AC
  Filled 2016-12-08: qty 20

## 2016-12-08 MED ORDER — ACETAMINOPHEN 160 MG/5ML PO SOLN
1000.0000 mg | Freq: Four times a day (QID) | ORAL | Status: AC
Start: 2016-12-09 — End: 2016-12-13

## 2016-12-08 MED ORDER — BISACODYL 5 MG PO TBEC
10.0000 mg | DELAYED_RELEASE_TABLET | Freq: Every day | ORAL | Status: DC
  Administered 2016-12-09 – 2016-12-10 (×2): 10 mg via ORAL
  Filled 2016-12-08 (×4): qty 2

## 2016-12-08 MED ORDER — ASPIRIN 81 MG PO CHEW: 324.0000 mg | CHEWABLE_TABLET | Freq: Every day | ORAL | Status: DC

## 2016-12-08 MED ORDER — DOPAMINE-DEXTROSE 3.2-5 MG/ML-% IV SOLN
INTRAVENOUS | Status: DC | PRN
  Administered 2016-12-08: 2.5 ug/kg/min via INTRAVENOUS

## 2016-12-08 MED ORDER — ACETAMINOPHEN 500 MG PO TABS
1000.0000 mg | ORAL_TABLET | Freq: Four times a day (QID) | ORAL | Status: AC
Start: 2016-12-09 — End: 2016-12-13
  Administered 2016-12-09 – 2016-12-13 (×20): 1000 mg via ORAL
  Filled 2016-12-08 (×20): qty 2

## 2016-12-08 MED ORDER — METOPROLOL TARTRATE 12.5 MG HALF TABLET
12.5000 mg | ORAL_TABLET | Freq: Two times a day (BID) | ORAL | Status: DC
  Administered 2016-12-09 – 2016-12-10 (×4): 12.5 mg via ORAL
  Filled 2016-12-08 (×4): qty 1

## 2016-12-08 MED ORDER — NITROGLYCERIN IN D5W 200-5 MCG/ML-% IV SOLN: 0.0000 ug/min | INTRAVENOUS | Status: DC

## 2016-12-08 MED ORDER — FENTANYL CITRATE (PF) 250 MCG/5ML IJ SOLN
INTRAMUSCULAR | Status: DC | PRN
Start: 2016-12-08 — End: 2016-12-08
  Administered 2016-12-08: 50 ug via INTRAVENOUS
  Administered 2016-12-08: 800 ug via INTRAVENOUS
  Administered 2016-12-08 (×2): 200 ug via INTRAVENOUS

## 2016-12-08 MED ORDER — MAGNESIUM SULFATE 4 GM/100ML IV SOLN
4.0000 g | Freq: Once | INTRAVENOUS | Status: AC
  Administered 2016-12-08: 4 g via INTRAVENOUS
  Filled 2016-12-08: qty 100

## 2016-12-08 MED ORDER — SODIUM CHLORIDE 0.9% FLUSH: 3.0000 mL | INTRAVENOUS | Status: DC | PRN

## 2016-12-08 MED ORDER — ACETAMINOPHEN 160 MG/5ML PO SOLN: 650.0000 mg | Freq: Once | ORAL | Status: AC

## 2016-12-08 MED ORDER — LIDOCAINE HCL (CARDIAC) 20 MG/ML IV SOLN
INTRAVENOUS | Status: AC
  Filled 2016-12-08: qty 5

## 2016-12-08 MED ORDER — LACTATED RINGERS IV SOLN: INTRAVENOUS | Status: DC

## 2016-12-08 MED ORDER — ROCURONIUM BROMIDE 50 MG/5ML IV SOLN
INTRAVENOUS | Status: AC
  Filled 2016-12-08: qty 3

## 2016-12-08 MED ORDER — METOPROLOL TARTRATE 25 MG/10 ML ORAL SUSPENSION: 12.5000 mg | Freq: Two times a day (BID) | ORAL | Status: DC

## 2016-12-08 MED ORDER — ALBUMIN HUMAN 5 % IV SOLN
250.0000 mL | INTRAVENOUS | Status: AC | PRN
  Administered 2016-12-08 (×2): 250 mL via INTRAVENOUS

## 2016-12-08 MED ORDER — BISACODYL 10 MG RE SUPP
10.0000 mg | Freq: Every day | RECTAL | Status: DC
  Filled 2016-12-08: qty 1

## 2016-12-08 MED ORDER — ATORVASTATIN CALCIUM 80 MG PO TABS
80.0000 mg | ORAL_TABLET | Freq: Every day | ORAL | Status: DC
  Administered 2016-12-09 – 2016-12-14 (×6): 80 mg via ORAL
  Filled 2016-12-08 (×7): qty 1

## 2016-12-08 MED ORDER — SODIUM CHLORIDE 0.9 % IV SOLN: 250.0000 mL | INTRAVENOUS | Status: DC

## 2016-12-08 MED ORDER — LEVALBUTEROL HCL 1.25 MG/0.5ML IN NEBU
1.2500 mg | INHALATION_SOLUTION | Freq: Four times a day (QID) | RESPIRATORY_TRACT | Status: DC
  Administered 2016-12-08 – 2016-12-09 (×5): 1.25 mg via RESPIRATORY_TRACT
  Filled 2016-12-08 (×5): qty 0.5

## 2016-12-08 MED ORDER — CHLORHEXIDINE GLUCONATE 0.12 % MT SOLN
15.0000 mL | OROMUCOSAL | Status: AC
  Administered 2016-12-08: 15 mL via OROMUCOSAL

## 2016-12-08 MED ORDER — ONDANSETRON HCL 4 MG/2ML IJ SOLN: 4.0000 mg | Freq: Four times a day (QID) | INTRAMUSCULAR | Status: DC | PRN

## 2016-12-08 MED ORDER — LEVALBUTEROL HCL 1.25 MG/0.5ML IN NEBU: 1.2500 mg | INHALATION_SOLUTION | Freq: Four times a day (QID) | RESPIRATORY_TRACT | Status: DC

## 2016-12-08 MED ORDER — PROTAMINE SULFATE 10 MG/ML IV SOLN
INTRAVENOUS | Status: AC
  Filled 2016-12-08: qty 25

## 2016-12-08 MED ORDER — SODIUM CHLORIDE 0.9 % IV SOLN
0.0000 ug/kg/h | INTRAVENOUS | Status: DC
  Filled 2016-12-08: qty 2

## 2016-12-08 MED ORDER — BUPROPION HCL ER (XL) 150 MG PO TB24
300.0000 mg | ORAL_TABLET | Freq: Every day | ORAL | Status: DC
  Administered 2016-12-09 – 2016-12-15 (×7): 300 mg via ORAL
  Filled 2016-12-08: qty 2
  Filled 2016-12-08 (×2): qty 1
  Filled 2016-12-08: qty 2
  Filled 2016-12-08 (×2): qty 1
  Filled 2016-12-08: qty 2

## 2016-12-08 MED ORDER — HEPARIN SODIUM (PORCINE) 1000 UNIT/ML IJ SOLN
INTRAMUSCULAR | Status: DC | PRN
  Administered 2016-12-08: 24 mL via INTRAVENOUS
  Administered 2016-12-08 (×2): 2 mL via INTRAVENOUS

## 2016-12-08 MED ORDER — MORPHINE SULFATE (PF) 4 MG/ML IV SOLN
1.0000 mg | INTRAVENOUS | Status: AC | PRN
  Administered 2016-12-08: 2 mg via INTRAVENOUS
  Administered 2016-12-08 (×2): 1 mg via INTRAVENOUS
  Filled 2016-12-08: qty 1

## 2016-12-08 MED ORDER — METOPROLOL TARTRATE 5 MG/5ML IV SOLN: 2.5000 mg | INTRAVENOUS | Status: DC | PRN

## 2016-12-08 MED ORDER — MORPHINE SULFATE (PF) 4 MG/ML IV SOLN
2.0000 mg | INTRAVENOUS | Status: DC | PRN
  Administered 2016-12-08 (×4): 2 mg via INTRAVENOUS
  Administered 2016-12-09 – 2016-12-10 (×8): 4 mg via INTRAVENOUS
  Filled 2016-12-08 (×10): qty 1

## 2016-12-08 MED ORDER — ROCURONIUM BROMIDE 100 MG/10ML IV SOLN
INTRAVENOUS | Status: DC | PRN
  Administered 2016-12-08: 100 mg via INTRAVENOUS
  Administered 2016-12-08 (×2): 50 mg via INTRAVENOUS

## 2016-12-08 MED ORDER — SODIUM CHLORIDE 0.9 % IV SOLN
INTRAVENOUS | Status: DC | PRN
  Administered 2016-12-08: .7 [IU]/h via INTRAVENOUS
  Administered 2016-12-08: 1 [IU]/h via INTRAVENOUS

## 2016-12-08 MED ORDER — HEMOSTATIC AGENTS (NO CHARGE) OPTIME
TOPICAL | Status: DC | PRN
  Administered 2016-12-08: 1 via TOPICAL

## 2016-12-08 MED ORDER — LIDOCAINE HCL (CARDIAC) 20 MG/ML IV SOLN
INTRAVENOUS | Status: DC | PRN
  Administered 2016-12-08: 80 mg via INTRAVENOUS

## 2016-12-08 MED ORDER — PROPOFOL 10 MG/ML IV BOLUS
INTRAVENOUS | Status: AC
  Filled 2016-12-08: qty 40

## 2016-12-08 MED ORDER — MILRINONE LACTATE IN DEXTROSE 20-5 MG/100ML-% IV SOLN
0.3750 ug/kg/min | INTRAVENOUS | Status: AC
  Administered 2016-12-08: .3 ug/kg/min via INTRAVENOUS
  Filled 2016-12-08: qty 100

## 2016-12-08 MED ORDER — ALBUMIN HUMAN 5 % IV SOLN
INTRAVENOUS | Status: DC | PRN
  Administered 2016-12-08 (×2): via INTRAVENOUS

## 2016-12-08 MED ORDER — SODIUM CHLORIDE 0.9 % IV SOLN: Freq: Once | INTRAVENOUS | Status: DC

## 2016-12-08 MED ORDER — SODIUM CHLORIDE 0.9 % IV SOLN
INTRAVENOUS | Status: DC | PRN
  Administered 2016-12-08: 0.2 ug/kg/h via INTRAVENOUS

## 2016-12-08 MED ORDER — ACETAMINOPHEN 650 MG RE SUPP
650.0000 mg | Freq: Once | RECTAL | Status: AC
  Administered 2016-12-08: 650 mg via RECTAL

## 2016-12-08 MED ORDER — ORAL CARE MOUTH RINSE
15.0000 mL | Freq: Two times a day (BID) | OROMUCOSAL | Status: DC
  Administered 2016-12-08 – 2016-12-11 (×6): 15 mL via OROMUCOSAL

## 2016-12-08 MED ORDER — LACTATED RINGERS IV SOLN
INTRAVENOUS | Status: DC | PRN
  Administered 2016-12-08: 07:00:00 via INTRAVENOUS

## 2016-12-08 MED ORDER — LACTATED RINGERS IV SOLN: 500.0000 mL | Freq: Once | INTRAVENOUS | Status: DC | PRN

## 2016-12-08 MED ORDER — ASPIRIN EC 325 MG PO TBEC
325.0000 mg | DELAYED_RELEASE_TABLET | Freq: Every day | ORAL | Status: DC
  Administered 2016-12-09 – 2016-12-15 (×7): 325 mg via ORAL
  Filled 2016-12-08 (×7): qty 1

## 2016-12-08 MED ORDER — DOCUSATE SODIUM 100 MG PO CAPS
200.0000 mg | ORAL_CAPSULE | Freq: Every day | ORAL | Status: DC
  Administered 2016-12-09 – 2016-12-10 (×2): 200 mg via ORAL
  Filled 2016-12-08 (×5): qty 2

## 2016-12-08 MED ORDER — DOPAMINE-DEXTROSE 3.2-5 MG/ML-% IV SOLN: 0.0000 ug/kg/min | INTRAVENOUS | Status: DC

## 2016-12-08 MED ORDER — POTASSIUM CHLORIDE 10 MEQ/50ML IV SOLN: 10.0000 meq | INTRAVENOUS | Status: AC

## 2016-12-08 MED ORDER — PROPOFOL 10 MG/ML IV BOLUS
INTRAVENOUS | Status: DC | PRN
  Administered 2016-12-08: 60 mg via INTRAVENOUS

## 2016-12-08 MED FILL — Potassium Chloride Inj 2 mEq/ML: INTRAVENOUS | Qty: 10 | Status: AC

## 2016-12-08 MED FILL — Magnesium Sulfate Inj 50%: INTRAMUSCULAR | Qty: 10 | Status: AC

## 2016-12-08 MED FILL — Heparin Sodium (Porcine) Inj 1000 Unit/ML: INTRAMUSCULAR | Qty: 30 | Status: AC

## 2016-12-08 SURGICAL SUPPLY — 100 items
ADAPTER CARDIO PERF ANTE/RETRO (ADAPTER) ×4 IMPLANT
BAG DECANTER FOR FLEXI CONT (MISCELLANEOUS) ×4 IMPLANT
BANDAGE ACE 4X5 VEL STRL LF (GAUZE/BANDAGES/DRESSINGS) ×8 IMPLANT
BANDAGE ACE 6X5 VEL STRL LF (GAUZE/BANDAGES/DRESSINGS) ×8 IMPLANT
BASKET HEART  (ORDER IN 25'S) (MISCELLANEOUS) ×1
BASKET HEART (ORDER IN 25'S) (MISCELLANEOUS) ×1
BASKET HEART (ORDER IN 25S) (MISCELLANEOUS) ×2 IMPLANT
BLADE CLIPPER SURG (BLADE) IMPLANT
BLADE STERNUM SYSTEM 6 (BLADE) ×4 IMPLANT
BLADE SURG 12 STRL SS (BLADE) ×4 IMPLANT
BNDG GAUZE ELAST 4 BULKY (GAUZE/BANDAGES/DRESSINGS) ×8 IMPLANT
CANISTER SUCT 3000ML PPV (MISCELLANEOUS) ×4 IMPLANT
CANNULA GUNDRY RCSP 15FR (MISCELLANEOUS) ×4 IMPLANT
CATH CPB KIT VANTRIGT (MISCELLANEOUS) ×4 IMPLANT
CATH ROBINSON RED A/P 18FR (CATHETERS) ×12 IMPLANT
CATH THORACIC 36FR RT ANG (CATHETERS) ×4 IMPLANT
CLIP FOGARTY SPRING 6M (CLIP) ×4 IMPLANT
CLIP TI WIDE RED SMALL 24 (CLIP) ×8 IMPLANT
CRADLE DONUT ADULT HEAD (MISCELLANEOUS) ×4 IMPLANT
DRAIN CHANNEL 32F RND 10.7 FF (WOUND CARE) ×4 IMPLANT
DRAPE CARDIOVASCULAR INCISE (DRAPES) ×2
DRAPE SLUSH/WARMER DISC (DRAPES) ×4 IMPLANT
DRAPE SRG 135X102X78XABS (DRAPES) ×2 IMPLANT
DRSG AQUACEL AG ADV 3.5X14 (GAUZE/BANDAGES/DRESSINGS) ×4 IMPLANT
ELECT BLADE 4.0 EZ CLEAN MEGAD (MISCELLANEOUS) ×4
ELECT BLADE 6.5 EXT (BLADE) ×4 IMPLANT
ELECT CAUTERY BLADE 6.4 (BLADE) ×4 IMPLANT
ELECT REM PT RETURN 9FT ADLT (ELECTROSURGICAL) ×8
ELECTRODE BLDE 4.0 EZ CLN MEGD (MISCELLANEOUS) ×2 IMPLANT
ELECTRODE REM PT RTRN 9FT ADLT (ELECTROSURGICAL) ×4 IMPLANT
FELT TEFLON 1X6 (MISCELLANEOUS) ×8 IMPLANT
GAUZE SPONGE 4X4 12PLY STRL (GAUZE/BANDAGES/DRESSINGS) ×12 IMPLANT
GLOVE BIO SURGEON STRL SZ7.5 (GLOVE) ×12 IMPLANT
GLOVE BIOGEL M 6.5 STRL (GLOVE) ×8 IMPLANT
GLOVE BIOGEL M STRL SZ7.5 (GLOVE) ×8 IMPLANT
GLOVE BIOGEL PI IND STRL 6 (GLOVE) ×4 IMPLANT
GLOVE BIOGEL PI IND STRL 6.5 (GLOVE) ×6 IMPLANT
GLOVE BIOGEL PI IND STRL 7.0 (GLOVE) ×4 IMPLANT
GLOVE BIOGEL PI INDICATOR 6 (GLOVE) ×4
GLOVE BIOGEL PI INDICATOR 6.5 (GLOVE) ×6
GLOVE BIOGEL PI INDICATOR 7.0 (GLOVE) ×4
GOWN STRL REUS W/ TWL LRG LVL3 (GOWN DISPOSABLE) ×20 IMPLANT
GOWN STRL REUS W/TWL LRG LVL3 (GOWN DISPOSABLE) ×20
HEMOSTAT POWDER SURGIFOAM 1G (HEMOSTASIS) ×12 IMPLANT
HEMOSTAT SURGICEL 2X14 (HEMOSTASIS) ×4 IMPLANT
INSERT FOGARTY XLG (MISCELLANEOUS) IMPLANT
KIT BASIN OR (CUSTOM PROCEDURE TRAY) ×4 IMPLANT
KIT ROOM TURNOVER OR (KITS) ×4 IMPLANT
KIT SUCTION CATH 14FR (SUCTIONS) ×4 IMPLANT
KIT VASOVIEW HEMOPRO VH 3000 (KITS) ×4 IMPLANT
LEAD PACING MYOCARDI (MISCELLANEOUS) ×4 IMPLANT
MARKER GRAFT CORONARY BYPASS (MISCELLANEOUS) ×12 IMPLANT
NS IRRIG 1000ML POUR BTL (IV SOLUTION) ×20 IMPLANT
PACK OPEN HEART (CUSTOM PROCEDURE TRAY) ×4 IMPLANT
PAD ARMBOARD 7.5X6 YLW CONV (MISCELLANEOUS) ×8 IMPLANT
PAD ELECT DEFIB RADIOL ZOLL (MISCELLANEOUS) ×4 IMPLANT
PENCIL BUTTON HOLSTER BLD 10FT (ELECTRODE) ×4 IMPLANT
PUNCH AORTIC ROTATE  4.5MM 8IN (MISCELLANEOUS) ×4 IMPLANT
PUNCH AORTIC ROTATE 4.0MM (MISCELLANEOUS) IMPLANT
PUNCH AORTIC ROTATE 4.5MM 8IN (MISCELLANEOUS) IMPLANT
PUNCH AORTIC ROTATE 5MM 8IN (MISCELLANEOUS) IMPLANT
SET CARDIOPLEGIA MPS 5001102 (MISCELLANEOUS) ×4 IMPLANT
SPONGE LAP 18X18 X RAY DECT (DISPOSABLE) ×4 IMPLANT
SURGIFLO W/THROMBIN 8M KIT (HEMOSTASIS) ×4 IMPLANT
SUT BONE WAX W31G (SUTURE) ×4 IMPLANT
SUT MNCRL AB 4-0 PS2 18 (SUTURE) IMPLANT
SUT PROLENE 3 0 SH DA (SUTURE) IMPLANT
SUT PROLENE 3 0 SH1 36 (SUTURE) IMPLANT
SUT PROLENE 4 0 RB 1 (SUTURE) ×2
SUT PROLENE 4 0 SH DA (SUTURE) ×4 IMPLANT
SUT PROLENE 4-0 RB1 .5 CRCL 36 (SUTURE) ×2 IMPLANT
SUT PROLENE 5 0 C 1 36 (SUTURE) IMPLANT
SUT PROLENE 6 0 C 1 30 (SUTURE) ×8 IMPLANT
SUT PROLENE 6 0 CC (SUTURE) ×24 IMPLANT
SUT PROLENE 8 0 BV175 6 (SUTURE) IMPLANT
SUT PROLENE BLUE 7 0 (SUTURE) ×8 IMPLANT
SUT SILK  1 MH (SUTURE)
SUT SILK 1 MH (SUTURE) IMPLANT
SUT SILK 2 0 SH CR/8 (SUTURE) ×4 IMPLANT
SUT SILK 3 0 SH CR/8 (SUTURE) ×4 IMPLANT
SUT STEEL 6MS V (SUTURE) ×16 IMPLANT
SUT STEEL SZ 6 DBL 3X14 BALL (SUTURE) ×8 IMPLANT
SUT VIC AB 1 CTX 36 (SUTURE) ×4
SUT VIC AB 1 CTX36XBRD ANBCTR (SUTURE) ×4 IMPLANT
SUT VIC AB 2-0 CT1 27 (SUTURE) ×4
SUT VIC AB 2-0 CT1 TAPERPNT 27 (SUTURE) ×4 IMPLANT
SUT VIC AB 2-0 CTX 27 (SUTURE) IMPLANT
SUT VIC AB 3-0 X1 27 (SUTURE) ×8 IMPLANT
SUTURE E-PAK OPEN HEART (SUTURE) ×4 IMPLANT
SYSTEM SAHARA CHEST DRAIN ATS (WOUND CARE) ×4 IMPLANT
TAPE CLOTH SURG 4X10 WHT LF (GAUZE/BANDAGES/DRESSINGS) ×4 IMPLANT
TAPE PAPER 2X10 WHT MICROPORE (GAUZE/BANDAGES/DRESSINGS) ×4 IMPLANT
TOWEL GREEN STERILE (TOWEL DISPOSABLE) ×16 IMPLANT
TOWEL GREEN STERILE FF (TOWEL DISPOSABLE) ×8 IMPLANT
TOWEL OR 17X24 6PK STRL BLUE (TOWEL DISPOSABLE) ×8 IMPLANT
TOWEL OR 17X26 10 PK STRL BLUE (TOWEL DISPOSABLE) ×8 IMPLANT
TRAY FOLEY SILVER 16FR TEMP (SET/KITS/TRAYS/PACK) ×4 IMPLANT
TUBING INSUFFLATION (TUBING) ×4 IMPLANT
UNDERPAD 30X30 (UNDERPADS AND DIAPERS) ×4 IMPLANT
WATER STERILE IRR 1000ML POUR (IV SOLUTION) ×8 IMPLANT

## 2016-12-08 NOTE — Op Note (Signed)
NAME:  Latoya Adams, Latoya Adams NO.:  [id number]  MEDICAL RECORD NO.:  [id number]  LOCATION:  2H04C                        FACILITY:  [facility]  PHYSICIAN:  Kerin Perna, M.D.  DATE OF BIRTH:  1945/07/29  DATE OF PROCEDURE:  12/08/2016 DATE OF DISCHARGE:                              OPERATIVE REPORT   OPERATION: 1. Coronary artery bypass grafting x4 (left internal mammary artery to     LAD, saphenous vein graft to diagonal, saphenous vein graft to     obtuse marginal, saphenous vein graft to posterior descending). 2. Endoscopic harvest of bilateral greater saphenous vein.  SURGEON:  Kerin Perna, M.D.  ASSISTANT:  Rowe Clack, PA-C.  PREOPERATIVE DIAGNOSES:  Acute anterior myocardial infarction, ischemic cardiomyopathy, ejection fraction 20%, severe three-vessel coronary artery disease, class 4 congestive heart failure.  POSTOPERATIVE DIAGNOSES:  Acute anterior myocardial infarction, ischemic cardiomyopathy, ejection fraction 20%, severe three-vessel coronary artery disease, class 4 congestive heart failure.  ANESTHESIA:  General by Dr. Arta Bruce.  CLINICAL NOTE:  The patient is a very nice 71 year old Caucasian female, nondiabetic, smoker, presented with shortness of breath, edema, and chest pain.  She had positive cardiac enzymes.  She was felt to have a non-ST elevation MI.  She underwent emergency cardiac catheterization, which showed severe LV dysfunction, ejection fraction 15% to 20%.  She had evidence of pulmonary edema as well as peripheral edema.  She was treated with heparin, nitroglycerin, and diuretics.  Cardiac catheterization demonstrated 95% to 99% stenosis of the LAD-diagonal, 80% to 90% stenosis of the posterior descending, and 60% stenosis of the circumflex marginal.  A right heart catheterization was performed to determine if the patient was in low cardiac output and her cardiac index was 2.0.  Wedge was 19 and CVP was 12.  She was in a  sinus rhythm. Echocardiogram showed biventricular dysfunction, mild-moderate MR, mild AI.  She was felt to be a candidate for surgical revascularization after a viability study with cardiac MRI showed minimal subendocardial scarring and it was felt that she had significant myocardium that would improve function with revascularization.  I discussed high risk CABG with the patient and her family.  She understood that the operation would be increased risk due to her severe LV dysfunction as well as to her underlying COPD as she has been an active smoker.  Previous CT scan of the chest showed significant emphysema without pulmonary nodules or suspicious mediastinal adenopathy.  I discussed the details of surgery including the use of general anesthesia and cardiopulmonary bypass, the expected postoperative recovery, and the potential risk of stroke, MI, bleeding, blood transfusion requirement, postoperative pulmonary problems including pleural effusions, pneumothorax, and prolonged ventilation dependence as well as wound infection and death.  She demonstrated her understanding to these issues and agreed to proceed with surgery under what I felt was an informed consent.  OPERATIVE FINDINGS: 1. Severe hyperinflated lungs. 2. Anterior wall recent MI with erythema and some thinning. 3. Preoperative anemia requiring 2 units of packed cells primed in the     cardio bypass circuit. 4. Improved LV function after separation from cardiopulmonary bypass     by TEE.  DESCRIPTION OF PROCEDURE:  The patient was brought to the operating room, placed supine on the operating table.  General anesthesia was induced under invasive hemodynamic monitoring.  The chest, abdomen, and legs were prepped with Betadine and draped as a sterile field.  A sternal incision was made as the saphenous vein was harvested first from the right leg and then the left leg with endoscopic technique.  The internal mammary  artery was harvested as a pedicle graft from its origin at the subclavian vessels.  It was a small 1.2-mm vessel, but had good flow.  The sternum was retracted and the pericardium opened and suspended.  Pursestrings were placed to the ascending aorta and right atrium and heparin was administered.  When the ACT was documented as being therapeutic, the patient was cannulated and placed on bypass.  The coronary arteries were identified for grafting.  The targets were small, but graftable.  The vein was of good quality.  Cardioplegia cannulas were placed for both antegrade and retrograde cold blood cardioplegia and the patient was cooled to 32 degrees.  The aortic crossclamp was applied and a liter of cold blood cardioplegia was delivered in split doses between the antegrade aortic and retrograde coronary sinus catheters.  There was good cardioplegic arrest and septal temperature dropped less than 15 degrees.  Cardioplegia was delivered every 20 minutes.  The distal coronary anastomoses were performed.  The first distal anastomosis was the posterior descending.  This was 1.5-mm vessel with proximal 80% stenosis.  A reverse saphenous vein was sewn end-to-side with running 7-0 Prolene with good flow through the graft.  The second distal anastomosis was the OM branch of the circumflex.  This was a 1.5-mm vessel.  It had a proximal 60% to 70% stenosis.  A reverse saphenous vein was sewn end-to-side with running 7-0 Prolene with good flow through the graft.  Cardioplegia was redosed.  The third distal anastomosis was the diagonal branch to LAD.  This was heavily calcified at its origin with a 90% stenosis.  It was 1.2-mm vessel.  A reverse saphenous vein was sewn end-to-side with running 7-0 Prolene with good flow through the graft.  Cardioplegia was redosed.  The fourth distal anastomosis was placed to the mid LAD.  It was 1.5-mm vessel.  There was a proximal 95% stenosis.  The left IMA  pedicle was brought through an opening in the left lateral pericardium, was brought down onto the LAD and sewn end-to-side with a running 8-0 Prolene. There was good flow through the anastomosis after briefly releasing the pedicle bulldog on the mammary artery.  The bulldog was reapplied.  The pedicle was secured to the epicardium.  Cardioplegia was redosed.  With the crossclamp still in place, 3 proximal vein anastomoses were performed on the ascending aorta with a 4.5-mm punch running 6-0 Prolene.  Prior to tying down the final proximal anastomosis, air was vented from the coronary arteries with a dose of retrograde warm blood cardioplegia.  The crossclamp was removed.  The vein grafts were de-aired and opened.  Each had good flow and hemostasis was documented at the proximal distal sites.  The patient was rewarmed and reperfused.  Temporary pacing wires were applied.  The lungs were expanded and the ventilator was resumed.  The patient was started on low-dose milrinone and dopamine.  She was then weaned off cardiopulmonary bypass without difficulty.  There was significant improvement in global LV function by echo.  There was still mild- moderate MR.  The patient was atrially paced.  Cardiac output was 5 L. Protamine was administered and there was no adverse reaction.  The cardioplegia cannula was removed.  The mediastinal fat was closed over the aorta.  The wound was irrigated with sterile saline.  Anterior mediastinal and left pleural chest tube were placed and brought out through separate incisions.  The sternum was closed.  The patient remained stable.  The pectoralis fascia was closed with a running #1 Vicryl.  The subcutaneous and skin layers were closed with a running Vicryl and sterile dressings were applied.  Total cardiopulmonary bypass time was 130 minutes.     Kerin Perna, M.D.     PV/MEDQ  D:  12/08/2016  T:  12/08/2016  Job:  [id number]  cc:   Bevelyn Buckles.  Bensimhon, MD

## 2016-12-08 NOTE — Plan of Care (Signed)
Problem: Activity: Goal: Risk for activity intolerance will decrease Outcome: Progressing Dangled well without incident  Problem: Fluid Volume: Goal: Ability to maintain a balanced intake and output will improve Outcome: Progressing UO> 60mL/hr;   Problem: Bowel/Gastric: Goal: Will not experience complications related to bowel motility Outcome: Progressing No nausea

## 2016-12-08 NOTE — Anesthesia Procedure Notes (Signed)
Procedure Name: Intubation Date/Time: 12/08/2016 8:00 AM Performed by: Reine Just Pre-anesthesia Checklist: Patient identified, Emergency Drugs available, Suction available, Patient being monitored and Timeout performed Patient Re-evaluated:Patient Re-evaluated prior to inductionOxygen Delivery Method: Circle system utilized and Simple face mask Preoxygenation: Pre-oxygenation with 100% oxygen Intubation Type: IV induction Ventilation: Mask ventilation without difficulty Laryngoscope Size: Miller and 2 Grade View: Grade I Tube type: Subglottic suction tube Tube size: 7.5 mm Number of attempts: 1 Airway Equipment and Method: Patient positioned with wedge pillow and Stylet Placement Confirmation: ETT inserted through vocal cords under direct vision,  positive ETCO2 and breath sounds checked- equal and bilateral Secured at: 20 cm Tube secured with: Tape

## 2016-12-08 NOTE — Procedures (Signed)
Extubation Procedure Note  Patient Details:   Name: Novaly Lookabaugh DOB: 08/14/1945 MRN: [id number]   Airway Documentation:     Evaluation  O2 sats: stable throughout Complications: No apparent complications Patient did tolerate procedure well. Bilateral Breath Sounds: Clear   Yes  Patient tolerated rapid wean. NIF -24 And VC 0.9 L. Positive for cuff leak. Patient extubated to a 4 Lpm nasal canula. No signs of dyspnea or stridor. Patient achieved 750 mL on Incentive Spirometer, three times. RN at bedside.   Ancil Boozer 12/08/2016, 5:56 PM

## 2016-12-08 NOTE — Anesthesia Procedure Notes (Signed)
Central Venous Catheter Insertion Performed by: Kipp Brood, anesthesiologist Start/End7/11/2016 6:35 AM, 12/08/2016 6:45 AM Patient location: Pre-op. Preanesthetic checklist: patient identified, IV checked, site marked, risks and benefits discussed, surgical consent, monitors and equipment checked, pre-op evaluation, timeout performed and anesthesia consent Position: supine Hand hygiene performed  and maximum sterile barriers used  Catheter size: 8.5 Fr PA cath was placed.Swan type:thermodilution Procedure performed using ultrasound guided technique. Ultrasound Notes:anatomy identified, needle tip was noted to be adjacent to the nerve/plexus identified, no ultrasound evidence of intravascular and/or intraneural injection and image(s) printed for medical record Attempts: 1 Patient tolerated the procedure well with no immediate complications.

## 2016-12-08 NOTE — Anesthesia Procedure Notes (Signed)
Arterial Line Insertion Start/End7/11/2016 7:00 AM, 12/08/2016 7:08 AM Performed by: Buck Mam, CRNA  Patient location: Pre-op. Preanesthetic checklist: patient identified, IV checked, site marked, risks and benefits discussed, surgical consent, monitors and equipment checked, pre-op evaluation and timeout performed Lidocaine 1% used for infiltration and patient sedated Left, radial was placed Catheter size: 20 G Hand hygiene performed  and maximum sterile barriers used  Allen's test indicative of satisfactory collateral circulation Attempts: 2 Procedure performed without using ultrasound guided technique. Following insertion, dressing applied and Biopatch. Post procedure assessment: normal  Patient tolerated the procedure well with no immediate complications.

## 2016-12-08 NOTE — Anesthesia Preprocedure Evaluation (Signed)
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      Neuro/Psych    GI/Hepatic   Endo/Other    Renal/GU      Musculoskeletal   Abdominal   Peds  Hematology   Anesthesia Other Findings   Reproductive/Obstetrics                             Anesthesia Physical Anesthesia Plan  ASA: III  Anesthesia Plan: General   Post-op Pain Management:    Induction: Intravenous  PONV Risk Score and Plan: 2 and Ondansetron, Dexamethasone and Treatment may vary due to age or medical condition  Airway Management Planned:   Additional Equipment:   Intra-op Plan:   Post-operative Plan: Post-operative intubation/ventilation  Informed Consent: I have reviewed the patients History and Physical, chart, labs and discussed the procedure including the risks, benefits and alternatives for the proposed anesthesia with the patient or authorized representative who has indicated his/her understanding and acceptance.     Plan Discussed with: CRNA and Surgeon  Anesthesia Plan Comments:         Anesthesia Quick Evaluation

## 2016-12-08 NOTE — Progress Notes (Signed)
Lab tech came to draw morning labs and patient had already left to get ready for OR. I called over to Pre-op area and spoke to anesthesia about labs that needed to be drawn. Kathlene November stated that he would let the nurse know.

## 2016-12-08 NOTE — Progress Notes (Signed)
The patient was examined and preop studies reviewed. There has been no change from the prior exam and the patient is ready for surgery. Plan CABG 0n M [place]

## 2016-12-08 NOTE — Brief Op Note (Signed)
12/03/2016 - 12/08/2016  11:47 AM  PATIENT:  Latoya Adams  71 y.o. female  PRE-OPERATIVE DIAGNOSIS:  CAD  POST-OPERATIVE DIAGNOSIS:  CAD  PROCEDURE:  Procedure(s): CORONARY ARTERY BYPASS GRAFTING (CABG) (N/A) TRANSESOPHAGEAL ECHOCARDIOGRAM (TEE) (N/A)  SURGEON:  Surgeon(s) and Role:    Kerin Perna, MD - Primary  PHYSICIAN ASSISTANT: WAYNE GOLD PA-C  ANESTHESIA:   general  EBL:  Total I/O In: -  Out: 170 [Urine:170]  BLOOD ADMINISTERED:2 UNITS PRBC'S AND 2 FFP  DRAINS: ROUTINE   LOCAL MEDICATIONS USED:  NONE  SPECIMEN:  No Specimen  DISPOSITION OF SPECIMEN:  N/A  COUNTS:  YES  TOURNIQUET:  * No tourniquets in log *  DICTATION: .Other Dictation: Dictation Number [id number]  PLAN OF CARE: Admit to inpatient   PATIENT DISPOSITION:  ICU - intubated and hemodynamically stable.   Delay start of Pharmacological VTE agent (>24hrs) due to surgical blood loss or risk of bleeding: yes  COMPLICATIONS: NO KNOWN

## 2016-12-08 NOTE — Anesthesia Procedure Notes (Signed)
Central Venous Catheter Insertion Performed by: Kipp Brood, anesthesiologist Start/End7/11/2016 7:35 AM, 12/08/2016 7:40 AM Patient location: OR. Preanesthetic checklist: patient identified, IV checked, site marked, risks and benefits discussed, surgical consent, monitors and equipment checked, pre-op evaluation and timeout performed Position: supine Hand hygiene performed , maximum sterile barriers used  and Seldinger technique used Catheter size: 8 Fr Central line was placed.Double lumen Procedure performed using ultrasound guided technique. Ultrasound Notes:anatomy identified, needle tip was noted to be adjacent to the nerve/plexus identified and no ultrasound evidence of intravascular and/or intraneural injection Attempts: 1 Following insertion, line sutured, dressing applied and Biopatch. Post procedure assessment: blood return through all ports, free fluid flow and no air

## 2016-12-08 NOTE — Progress Notes (Signed)
  Echocardiogram Echocardiogram Transesophageal has been performed.  Arvil Chaco 12/08/2016, 10:09 AM

## 2016-12-08 NOTE — Progress Notes (Signed)
      [address]       [place] [address]             [phone]      S/p CABG x 4  BP (!) 87/53   Pulse 88   Temp 98.8 F (37.1 C)   Resp 14   Ht 5\' 8"  (1.727 m)   Wt 124 lb 9 oz (56.5 kg)   SpO2 95%   BMI 18.94 kg/m    Intake/Output Summary (Last 24 hours) at 12/08/16 2107 Last data filed at 12/08/16 2000  Gross per 24 hour  Intake          4667.75 ml  Output             1535 ml  Net          3132.75 ml   CI= 3.0 on dopamine at 3  Hct= 24, K= 4.1  Doing well early postop  Viviann Spare C. Dorris Fetch, MD [facility] [phone]

## 2016-12-08 NOTE — Anesthesia Postprocedure Evaluation (Signed)
Anesthesia Post Note  Patient: Latoya Adams  Procedure(s) Performed: Procedure(s) (LRB): CORONARY ARTERY BYPASS GRAFTING (CABG)x4 using left internal mammary artery and bilateral greater saphenous veins harvested endoscopically (N/A) TRANSESOPHAGEAL ECHOCARDIOGRAM (TEE) (N/A)     Patient location during evaluation: SICU Anesthesia Type: General Level of consciousness: patient remains intubated per anesthesia plan and sedated Pain management: pain level controlled Vital Signs Assessment: post-procedure vital signs reviewed and stable Respiratory status: patient remains intubated per anesthesia plan and respiratory function stable Cardiovascular status: stable Postop Assessment: no signs of nausea or vomiting Anesthetic complications: no    Last Vitals:  Vitals:   12/08/16 0611 12/08/16 1340  BP:  (!) 121/54  Pulse: 82 90  Resp: (!) 25 12  Temp:      Last Pain:  Vitals:   12/08/16 0353  TempSrc: Oral  PainSc:                  Katiejo Gilroy DAVID

## 2016-12-08 NOTE — Transfer of Care (Signed)
Immediate Anesthesia Transfer of Care Note  Patient: Latoya Adams  Procedure(s) Performed: Procedure(s): CORONARY ARTERY BYPASS GRAFTING (CABG)x4 using left internal mammary artery and bilateral greater saphenous veins harvested endoscopically (N/A) TRANSESOPHAGEAL ECHOCARDIOGRAM (TEE) (N/A)  Patient Location: SICU  Anesthesia Type:General  Level of Consciousness: Patient remains intubated per anesthesia plan  Airway & Oxygen Therapy: Patient remains intubated per anesthesia plan and Patient placed on Ventilator (see vital sign flow sheet for setting)  Post-op Assessment: Report given to RN and Post -op Vital signs reviewed and stable  Post vital signs: Reviewed and stable  Last Vitals:  Vitals:   12/08/16 0522 12/08/16 0611  BP: 137/85   Pulse: (!) 101 82  Resp: (!) 28 (!) 25  Temp:      Last Pain:  Vitals:   12/08/16 0353  TempSrc: Oral  PainSc:       Patients Stated Pain Goal: 0 (12/04/16 1040)  Complications: No apparent anesthesia complications

## 2016-12-09 ENCOUNTER — Inpatient Hospital Stay (HOSPITAL_COMMUNITY): Payer: Medicare PPO

## 2016-12-09 DIAGNOSIS — Z951 Presence of aortocoronary bypass graft: Secondary | ICD-10-CM

## 2016-12-09 LAB — BASIC METABOLIC PANEL
ANION GAP: 9 (ref 5–15)
BUN: 10 mg/dL (ref 6–20)
CALCIUM: 8.1 mg/dL — AB (ref 8.9–10.3)
CO2: 20 mmol/L — ABNORMAL LOW (ref 22–32)
Chloride: 108 mmol/L (ref 101–111)
Creatinine, Ser: 0.58 mg/dL (ref 0.44–1.00)
GLUCOSE: 98 mg/dL (ref 65–99)
POTASSIUM: 3.6 mmol/L (ref 3.5–5.1)
SODIUM: 137 mmol/L (ref 135–145)

## 2016-12-09 LAB — GLUCOSE, CAPILLARY
GLUCOSE-CAPILLARY: 111 mg/dL — AB (ref 65–99)
GLUCOSE-CAPILLARY: 111 mg/dL — AB (ref 65–99)
GLUCOSE-CAPILLARY: 115 mg/dL — AB (ref 65–99)
GLUCOSE-CAPILLARY: 120 mg/dL — AB (ref 65–99)
GLUCOSE-CAPILLARY: 123 mg/dL — AB (ref 65–99)
GLUCOSE-CAPILLARY: 163 mg/dL — AB (ref 65–99)
GLUCOSE-CAPILLARY: 87 mg/dL (ref 65–99)
Glucose-Capillary: 102 mg/dL — ABNORMAL HIGH (ref 65–99)
Glucose-Capillary: 109 mg/dL — ABNORMAL HIGH (ref 65–99)
Glucose-Capillary: 111 mg/dL — ABNORMAL HIGH (ref 65–99)
Glucose-Capillary: 113 mg/dL — ABNORMAL HIGH (ref 65–99)
Glucose-Capillary: 114 mg/dL — ABNORMAL HIGH (ref 65–99)
Glucose-Capillary: 118 mg/dL — ABNORMAL HIGH (ref 65–99)
Glucose-Capillary: 122 mg/dL — ABNORMAL HIGH (ref 65–99)
Glucose-Capillary: 160 mg/dL — ABNORMAL HIGH (ref 65–99)
Glucose-Capillary: 187 mg/dL — ABNORMAL HIGH (ref 65–99)

## 2016-12-09 LAB — CREATININE, SERUM
CREATININE: 0.81 mg/dL (ref 0.44–1.00)
GFR calc Af Amer: >60 mL/min (ref >60)
GFR calc non Af Amer: >60 mL/min (ref >60)

## 2016-12-09 LAB — PREPARE FRESH FROZEN PLASMA
UNIT DIVISION: 0
Unit division: 0

## 2016-12-09 LAB — CBC
HCT: 27.2 % — ABNORMAL LOW (ref 36.0–46.0)
HCT: 26.8 % — ABNORMAL LOW (ref 36.0–46.0)
HEMOGLOBIN: 8.7 g/dL — AB (ref 12.0–15.0)
Hemoglobin: 8.8 g/dL — ABNORMAL LOW (ref 12.0–15.0)
MCH: 28.4 pg (ref 26.0–34.0)
MCH: 28.2 pg (ref 26.0–34.0)
MCHC: 32.4 g/dL (ref 30.0–36.0)
MCHC: 32.5 g/dL (ref 30.0–36.0)
MCV: 87.7 fL (ref 78.0–100.0)
MCV: 87 fL (ref 78.0–100.0)
PLATELETS: 138 10*3/uL — AB (ref 150–400)
Platelets: 156 10*3/uL (ref 150–400)
RBC: 3.1 MIL/uL — AB (ref 3.87–5.11)
RBC: 3.08 MIL/uL — ABNORMAL LOW (ref 3.87–5.11)
RDW: 15.1 % (ref 11.5–15.5)
RDW: 14.8 % (ref 11.5–15.5)
WBC: 8.2 10*3/uL (ref 4.0–10.5)
WBC: 11.5 10*3/uL — ABNORMAL HIGH (ref 4.0–10.5)

## 2016-12-09 LAB — BPAM FFP
BLOOD PRODUCT EXPIRATION DATE: 201807112359
Blood Product Expiration Date: 201807112359
ISSUE DATE / TIME: 201807061157
ISSUE DATE / TIME: 201807061200
UNIT TYPE AND RH: 7300
Unit Type and Rh: 7300

## 2016-12-09 LAB — MAGNESIUM
MAGNESIUM: 2.3 mg/dL (ref 1.7–2.4)
Magnesium: 2.3 mg/dL (ref 1.7–2.4)

## 2016-12-09 LAB — POCT I-STAT, CHEM 8
BUN: 14 mg/dL (ref 6–20)
CALCIUM ION: 1.18 mmol/L (ref 1.15–1.40)
Chloride: 103 mmol/L (ref 101–111)
Creatinine, Ser: 0.6 mg/dL (ref 0.44–1.00)
GLUCOSE: 140 mg/dL — AB (ref 65–99)
HCT: 26 % — ABNORMAL LOW (ref 36.0–46.0)
HEMOGLOBIN: 8.8 g/dL — AB (ref 12.0–15.0)
Potassium: 3.9 mmol/L (ref 3.5–5.1)
SODIUM: 136 mmol/L (ref 135–145)
TCO2: 24 mmol/L (ref 0–100)

## 2016-12-09 MED ORDER — CHLORHEXIDINE GLUCONATE CLOTH 2 % EX PADS
6.0000 | MEDICATED_PAD | Freq: Every day | CUTANEOUS | Status: DC
  Administered 2016-12-09 – 2016-12-12 (×4): 6 via TOPICAL

## 2016-12-09 MED ORDER — WHITE PETROLATUM GEL
Status: AC
  Administered 2016-12-09: 14:00:00
  Filled 2016-12-09: qty 1

## 2016-12-09 MED ORDER — INSULIN ASPART 100 UNIT/ML ~~LOC~~ SOLN: 0.0000 [IU] | SUBCUTANEOUS | Status: DC

## 2016-12-09 MED ORDER — POTASSIUM CHLORIDE 10 MEQ/50ML IV SOLN
10.0000 meq | INTRAVENOUS | Status: AC
  Administered 2016-12-09 (×2): 10 meq via INTRAVENOUS
  Filled 2016-12-09 (×2): qty 50

## 2016-12-09 MED ORDER — ENOXAPARIN SODIUM 40 MG/0.4ML ~~LOC~~ SOLN
40.0000 mg | Freq: Every day | SUBCUTANEOUS | Status: DC
  Administered 2016-12-09 – 2016-12-10 (×2): 40 mg via SUBCUTANEOUS
  Filled 2016-12-09 (×2): qty 0.4

## 2016-12-09 MED ORDER — SODIUM CHLORIDE 0.9% FLUSH
10.0000 mL | Freq: Two times a day (BID) | INTRAVENOUS | Status: DC
  Administered 2016-12-09: 10 mL
  Administered 2016-12-09: 40 mL
  Administered 2016-12-10 – 2016-12-11 (×3): 10 mL
  Administered 2016-12-12: 20 mL

## 2016-12-09 MED ORDER — SODIUM CHLORIDE 0.9% FLUSH: 10.0000 mL | INTRAVENOUS | Status: DC | PRN

## 2016-12-09 MED ORDER — INSULIN ASPART 100 UNIT/ML ~~LOC~~ SOLN
0.0000 [IU] | SUBCUTANEOUS | Status: DC
  Administered 2016-12-09: 4 [IU] via SUBCUTANEOUS
  Administered 2016-12-09: 2 [IU] via SUBCUTANEOUS
  Administered 2016-12-09: 4 [IU] via SUBCUTANEOUS

## 2016-12-09 NOTE — Progress Notes (Signed)
      [address]       [place] [address]             [phone]      Up in chair  BP 116/67   Pulse 98   Temp 98.3 F (36.8 C) (Oral)   Resp 16   Ht 5\' 8"  (1.727 m)   Wt 138 lb 9.6 oz (62.9 kg)   SpO2 93%   BMI 21.07 kg/m    Intake/Output Summary (Last 24 hours) at 12/09/16 1850 Last data filed at 12/09/16 1600  Gross per 24 hour  Intake          2496.58 ml  Output             2515 ml  Net           -18.42 ml   sats low on 2L [place]- work on [employer] C. Dorris Fetch, MD [facility] [phone]

## 2016-12-09 NOTE — Progress Notes (Signed)
Advanced Heart Failure Rounding Note  Primary Cardiologist: Excell Seltzer  Subjective:    Underwent CABG x 4 on 7/6. Chest very sore post-op. Getting pain meds q2hr   On dopamine 3, milrinone 0.3. Maintaing NSR   Maps 60-70s.    Swan  PA 23/11 (17) CO/CI 5.2/3.1  cMRI EF 24% with only small scar in apex  Objective:   Weight Range: 62.9 kg (138 lb 9.6 oz) Body mass index is 21.07 kg/m.   Vital Signs:   Temp:  [96.1 F (35.6 C)-99.3 F (37.4 C)] 98.1 F (36.7 C) (07/07 0752) Pulse Rate:  [80-106] 98 (07/07 0700) Resp:  [12-26] 15 (07/07 0700) BP: (87-121)/(45-70) 108/56 (07/07 0700) SpO2:  [92 %-100 %] 95 % (07/07 0752) Arterial Line BP: (113-148)/(43-57) 128/46 (07/07 0700) FiO2 (%):  [40 %-50 %] 40 % (07/06 1705) Weight:  [62.9 kg (138 lb 9.6 oz)] 62.9 kg (138 lb 9.6 oz) (07/07 0500) Last BM Date: 12/06/16  Weight change: Filed Weights   12/07/16 0500 12/08/16 0500 12/09/16 0500  Weight: 58.1 kg (128 lb) 56.5 kg (124 lb 9 oz) 62.9 kg (138 lb 9.6 oz)    Intake/Output:   Intake/Output Summary (Last 24 hours) at 12/09/16 0816 Last data filed at 12/09/16 0600  Gross per 24 hour  Intake          5825.47 ml  Output             3445 ml  Net          2380.47 ml      Physical Exam    General: Extubated. Lying in bed. Holding chest uncomfortablt HEENT: Normal Neck: Supple.RIJ swan . Carotids 2+  Cor: sternal dressing intact. RRR no rub appreciated Lungs: clear anteriorly Abdomen: Soft, non-tender, mildly distended quiet BS Extremities: no cyanosis, clubbing, rash, 1+ edema. SVG sites ok Neuro: alert & oriented x 3, cranial nerves grossly intact. moves all 4 extremities w/o difficulty. Affect pleasant   Telemetry   NSR 90s. Personally reviewed    EKG    EKG from 12/05/16 with sinus tach, Personally reviewed.   Labs    CBC  Recent Labs  12/08/16 1834 12/08/16 1848 12/09/16 0400  WBC 9.6  --  8.2  HGB 8.9* 8.2* 8.8*  HCT 27.6* 24.0* 27.2*  MCV  86.3  --  87.7  PLT 139*  --  138*   Basic Metabolic Panel  Recent Labs  12/07/16 0831  12/08/16 1834 12/08/16 1848 12/09/16 0400  NA 140  < >  --  141 137  K 4.4  < >  --  4.1 3.6  CL 107  < >  --  106 108  CO2 24  --   --   --  20*  GLUCOSE 104*  < >  --  114* 98  BUN 11  < >  --  11 10  CREATININE 0.68  < > 0.58 0.50 0.58  CALCIUM 8.5*  --   --   --  8.1*  MG  --   --  3.2*  --  2.3  < > = values in this interval not displayed. Liver Function Tests No results for input(s): AST, ALT, ALKPHOS, BILITOT, PROT, ALBUMIN in the last 72 hours. No results for input(s): LIPASE, AMYLASE in the last 72 hours. Cardiac Enzymes No results for input(s): CKTOTAL, CKMB, CKMBINDEX, TROPONINI in the last 72 hours.  BNP: BNP (last 3 results)  Recent Labs  12/03/16 1748 12/03/16 2212  BNP 2,721.4* 2,339.4*    ProBNP (last 3 results) No results for input(s): PROBNP in the last 8760 hours.   D-Dimer No results for input(s): DDIMER in the last 72 hours. Hemoglobin A1C No results for input(s): HGBA1C in the last 72 hours. Fasting Lipid Panel No results for input(s): CHOL, HDL, LDLCALC, TRIG, CHOLHDL, LDLDIRECT in the last 72 hours. Thyroid Function Tests No results for input(s): TSH, T4TOTAL, T3FREE, THYROIDAB in the last 72 hours.  Invalid input(s): FREET3  Other results:   Imaging    Dg Chest Port 1 View  Result Date: 12/09/2016 CLINICAL DATA:  Patient status post CABG procedure. EXAM: PORTABLE CHEST 1 VIEW COMPARISON:  Chest radiograph 12/08/2016. FINDINGS: PA catheter tip projects over the main pulmonary artery. Mediastinal drain and left chest tubes in place. Interval extubation and removal of enteric tubes. Monitoring leads overlie the patient. Stable cardiac and mediastinal contours status post median sternotomy. Minimal heterogeneous opacities lung bases bilaterally. Probable trace pleural effusions. No pneumothorax. IMPRESSION: Interval extubation and removal of enteric  tube. Probable trace effusions and underlying opacities favored to represent atelectasis. Electronically Signed   By: Annia Belt M.D.   On: 12/09/2016 07:19   Dg Chest Port 1 View  Result Date: 12/08/2016 CLINICAL DATA:  Status post coronary bypass grafting EXAM: PORTABLE CHEST 1 VIEW COMPARISON:  12/03/2016 FINDINGS: Cardiac shadow is stable. Postoperative changes are now seen. Swan-Ganz catheter, endotracheal tube and nasogastric catheter are all noted in satisfactory position. Left thoracostomy catheter and mediastinal drain are noted as well. No pneumothorax is seen. No focal infiltrate or sizable effusion is noted. Right jugular central line is also noted in the distal superior vena cava. IMPRESSION: Tubes and lines as described. No acute abnormality following recent cardiac surgery. Electronically Signed   By: Alcide Clever M.D.   On: 12/08/2016 14:21     Medications:     Scheduled Medications: . acetaminophen  1,000 mg Oral Q6H   Or  . acetaminophen (TYLENOL) oral liquid 160 mg/5 mL  1,000 mg Per Tube Q6H  . aspirin EC  325 mg Oral Daily   Or  . aspirin  324 mg Per Tube Daily  . atorvastatin  80 mg Oral q1800  . bisacodyl  10 mg Oral Daily   Or  . bisacodyl  10 mg Rectal Daily  . buPROPion  300 mg Oral Daily  . Chlorhexidine Gluconate Cloth  6 each Topical Daily  . docusate sodium  200 mg Oral Daily  . insulin aspart  0-24 Units Subcutaneous Q4H  . levalbuterol  1.25 mg Nebulization Q6H  . mouth rinse  15 mL Mouth Rinse BID  . metoCLOPramide (REGLAN) injection  10 mg Intravenous Q6H  . metoprolol tartrate  12.5 mg Oral BID   Or  . metoprolol tartrate  12.5 mg Per Tube BID  . mometasone-formoterol  2 puff Inhalation BID  . [START ON 12/10/2016] pantoprazole  40 mg Oral Daily  . sertraline  200 mg Oral Daily  . sodium chloride flush  10-40 mL Intracatheter Q12H  . sodium chloride flush  3 mL Intravenous Q12H    Infusions: . sodium chloride 20 mL/hr at 12/09/16 0600  .  sodium chloride    . sodium chloride 20 mL/hr at 12/09/16 0600  . albumin human    . cefUROXime (ZINACEF)  IV Stopped (12/08/16 2032)  . dexmedetomidine (PRECEDEX) IV infusion Stopped (12/08/16 1630)  . DOPamine 3.021 mcg/kg/min (12/09/16 0600)  . lactated ringers    . lactated  ringers 10 mL/hr at 12/09/16 0600  . lactated ringers 20 mL/hr at 12/09/16 0600  . milrinone 0.3 mcg/kg/min (12/09/16 0600)  . nitroGLYCERIN 40 mcg/min (12/08/16 1415)  . phenylephrine (NEO-SYNEPHRINE) Adult infusion Stopped (12/09/16 0508)  . potassium chloride Stopped (12/09/16 0729)    PRN Medications: sodium chloride, albumin human, lactated ringers, metoprolol tartrate, midazolam, morphine injection, ondansetron (ZOFRAN) IV, oxyCODONE, sodium chloride flush, sodium chloride flush, traMADol    Patient Profile   Latoya Adams is a 71 y/o woman with h/o HTN and tobacco abuse admitted 7/1 with NSTEMI (peak trop 7.95) and acute HF found to have critical proximal LAD lesion with EF 20-25%.   Assessment/Plan   1. NSTEMI/CAD -- Critical pLAD disease with ulcerated plaque at bifurcation with D1 as well as RPDA 90% lesion -- RHC ok. PFTs with severe COPD. Carotid u/s ok  -- POD #1 CABG x 4. Continue post-CABG care. Pain control discussed with RN -- Wean dopa as tolerated. Continue milrinone  2. Acute systolic HF -- Echo EF 20-25% with normal RV -- cMRI EF 24% with only small scar in apex. RHC with normal CO and no PAH -- Suspect EF will recover with CABG. -- Now POD #1 CABG x 4. Hemodynamics ok.  Wean dopa as tolerated. Diurese Continue milrinone -- On low-dose b-blocker. Can continue for now  3. PAD -- Stable  4. Tobacco use/COPD -- PFTs show severe COPD. Continue pulmonary toilet.    Length of Stay: 6  Arvilla Meres, MD  12/09/2016, 8:16 AM  Advanced Heart Failure Team Pager [phone] (M-F; 7a - 4p)  Please contact [facility]

## 2016-12-09 NOTE — Progress Notes (Signed)
1 Day Post-Op Procedure(s) (LRB): CORONARY ARTERY BYPASS GRAFTING (CABG)x4 using left internal mammary artery and bilateral greater saphenous veins harvested endoscopically (N/A) TRANSESOPHAGEAL ECHOCARDIOGRAM (TEE) (N/A) Subjective: C/o pain  Objective: Vital signs in last 24 hours: Temp:  [96.1 F (35.6 C)-99.3 F (37.4 C)] 98.1 F (36.7 C) (07/07 0752) Pulse Rate:  [80-106] 98 (07/07 0700) Cardiac Rhythm: Normal sinus rhythm (07/07 0400) Resp:  [12-26] 15 (07/07 0700) BP: (87-121)/(45-70) 108/56 (07/07 0700) SpO2:  [92 %-100 %] 95 % (07/07 0752) Arterial Line BP: (113-148)/(43-57) 128/46 (07/07 0700) FiO2 (%):  [40 %-50 %] 40 % (07/06 1705) Weight:  [138 lb 9.6 oz (62.9 kg)] 138 lb 9.6 oz (62.9 kg) (07/07 0500)  Hemodynamic parameters for last 24 hours: PAP: (26-42)/(12-27) 26/12 CO:  [3.9 L/min-5.6 L/min] 5.6 L/min CI:  [2.4 L/min/m2-3.3 L/min/m2] 3.3 L/min/m2  Intake/Output from previous day: 07/06 0701 - 07/07 0700 In: 5825.5 [I.V.:3670.5; Blood:705; IV Piggyback:1450] Out: 3445 [Urine:2675; Blood:500; Chest Tube:270] Intake/Output this shift: No intake/output data recorded.  General appearance: alert, cooperative and mild distress Neurologic: intact Heart: regular rate and rhythm Lungs: diminished breath sounds bibasilar Abdomen: normal findings: soft, non-tender  Lab Results:  Recent Labs  12/08/16 1834 12/08/16 1848 12/09/16 0400  WBC 9.6  --  8.2  HGB 8.9* 8.2* 8.8*  HCT 27.6* 24.0* 27.2*  PLT 139*  --  138*   BMET:  Recent Labs  12/07/16 0831  12/08/16 1848 12/09/16 0400  NA 140  < > 141 137  K 4.4  < > 4.1 3.6  CL 107  < > 106 108  CO2 24  --   --  20*  GLUCOSE 104*  < > 114* 98  BUN 11  < > 11 10  CREATININE 0.68  < > 0.50 0.58  CALCIUM 8.5*  --   --  8.1*  < > = values in this interval not displayed.  PT/INR:  Recent Labs  12/08/16 1341  LABPROT 16.7*  INR 1.34   ABG    Component Value Date/Time   PHART 7.366 12/08/2016 1844   HCO3 21.8 12/08/2016 1844   TCO2 22 12/08/2016 1848   ACIDBASEDEF 3.0 (H) 12/08/2016 1844   O2SAT 91.0 12/08/2016 1844   CBG (last 3)   Recent Labs  12/09/16 0254 12/09/16 0409 12/09/16 0753  GLUCAP 87 111* 111*    Assessment/Plan: S/P Procedure(s) (LRB): CORONARY ARTERY BYPASS GRAFTING (CABG)x4 using left internal mammary artery and bilateral greater saphenous veins harvested endoscopically (N/A) TRANSESOPHAGEAL ECHOCARDIOGRAM (TEE) (N/A) -  CV- s/p CABG. Good index on milrinone and dopamine- wean dopamine if tolerated  Continue milrinone for now  ASA, beta blocker, statin  Dc swan and A line  RESP-- IS for atelectasis  RENAL- creatinine normal, lytes OK  ENDO- CBG well controlled- off drip, Q4 SSI  Anemia secondary to ABL- follow  SCD + enoxaparin for DVT prophylaxis  Dc chest tubes  mobilize   LOS: 6 days    Loreli Slot 12/09/2016

## 2016-12-10 ENCOUNTER — Inpatient Hospital Stay (HOSPITAL_COMMUNITY): Payer: Medicare PPO

## 2016-12-10 LAB — GLUCOSE, CAPILLARY
GLUCOSE-CAPILLARY: 108 mg/dL — AB (ref 65–99)
GLUCOSE-CAPILLARY: 136 mg/dL — AB (ref 65–99)
Glucose-Capillary: 118 mg/dL — ABNORMAL HIGH (ref 65–99)
Glucose-Capillary: 118 mg/dL — ABNORMAL HIGH (ref 65–99)
Glucose-Capillary: 147 mg/dL — ABNORMAL HIGH (ref 65–99)

## 2016-12-10 LAB — POCT I-STAT 4, (NA,K, GLUC, HGB,HCT)
Glucose, Bld: 139 mg/dL — ABNORMAL HIGH (ref 65–99)
HEMATOCRIT: 22 % — AB (ref 36.0–46.0)
Hemoglobin: 7.5 g/dL — ABNORMAL LOW (ref 12.0–15.0)
Potassium: 3.2 mmol/L — ABNORMAL LOW (ref 3.5–5.1)
Sodium: 138 mmol/L (ref 135–145)

## 2016-12-10 LAB — BASIC METABOLIC PANEL
ANION GAP: 7 (ref 5–15)
BUN: 12 mg/dL (ref 6–20)
CALCIUM: 8.2 mg/dL — AB (ref 8.9–10.3)
CHLORIDE: 104 mmol/L (ref 101–111)
CO2: 23 mmol/L (ref 22–32)
Creatinine, Ser: 0.67 mg/dL (ref 0.44–1.00)
GFR calc non Af Amer: >60 mL/min (ref >60)
Glucose, Bld: 109 mg/dL — ABNORMAL HIGH (ref 65–99)
Potassium: 3.7 mmol/L (ref 3.5–5.1)
SODIUM: 134 mmol/L — AB (ref 135–145)

## 2016-12-10 LAB — COOXEMETRY PANEL
CARBOXYHEMOGLOBIN: 1.5 % (ref 0.5–1.5)
Methemoglobin: 0.7 % (ref 0.0–1.5)
O2 Saturation: 56.8 %
Total hemoglobin: 8.4 g/dL — ABNORMAL LOW (ref 12.0–16.0)

## 2016-12-10 LAB — CBC
HCT: 25.2 % — ABNORMAL LOW (ref 36.0–46.0)
HEMOGLOBIN: 8.1 g/dL — AB (ref 12.0–15.0)
MCH: 27.8 pg (ref 26.0–34.0)
MCHC: 32.1 g/dL (ref 30.0–36.0)
MCV: 86.6 fL (ref 78.0–100.0)
Platelets: 148 10*3/uL — ABNORMAL LOW (ref 150–400)
RBC: 2.91 MIL/uL — AB (ref 3.87–5.11)
RDW: 14.9 % (ref 11.5–15.5)
WBC: 10.6 10*3/uL — ABNORMAL HIGH (ref 4.0–10.5)

## 2016-12-10 MED ORDER — INSULIN ASPART 100 UNIT/ML ~~LOC~~ SOLN
0.0000 [IU] | Freq: Three times a day (TID) | SUBCUTANEOUS | Status: DC
  Administered 2016-12-10: 1 [IU] via SUBCUTANEOUS

## 2016-12-10 MED ORDER — FUROSEMIDE 10 MG/ML IJ SOLN
20.0000 mg | Freq: Two times a day (BID) | INTRAMUSCULAR | Status: DC
  Administered 2016-12-10 (×2): 20 mg via INTRAVENOUS
  Filled 2016-12-10 (×3): qty 2

## 2016-12-10 MED ORDER — LEVALBUTEROL HCL 1.25 MG/0.5ML IN NEBU
1.2500 mg | INHALATION_SOLUTION | Freq: Three times a day (TID) | RESPIRATORY_TRACT | Status: DC
  Administered 2016-12-10 – 2016-12-12 (×7): 1.25 mg via RESPIRATORY_TRACT
  Filled 2016-12-10 (×8): qty 0.5

## 2016-12-10 MED ORDER — POTASSIUM CHLORIDE 10 MEQ/50ML IV SOLN
INTRAVENOUS | Status: AC
  Administered 2016-12-10: 10 meq via INTRAVENOUS
  Filled 2016-12-10: qty 50

## 2016-12-10 MED ORDER — POTASSIUM CHLORIDE 10 MEQ/50ML IV SOLN
10.0000 meq | INTRAVENOUS | Status: AC
Start: 2016-12-10 — End: 2016-12-11
  Administered 2016-12-10 (×4): 10 meq via INTRAVENOUS
  Filled 2016-12-10 (×3): qty 50

## 2016-12-10 MED ORDER — LEVALBUTEROL HCL 1.25 MG/0.5ML IN NEBU: 1.2500 mg | INHALATION_SOLUTION | Freq: Four times a day (QID) | RESPIRATORY_TRACT | Status: DC | PRN

## 2016-12-10 MED ORDER — POTASSIUM CHLORIDE 10 MEQ/50ML IV SOLN
10.0000 meq | INTRAVENOUS | Status: AC
  Administered 2016-12-10 (×3): 10 meq via INTRAVENOUS
  Filled 2016-12-10 (×3): qty 50

## 2016-12-10 NOTE — Progress Notes (Addendum)
Advanced Heart Failure Rounding Note  Primary Cardiologist: Excell Seltzer  Subjective:    Underwent CABG x 4 on 7/6.   Chest still a bit sore but walking with nurse.   Lasix started. Co-ox 57% on milrinone. Swan out    cMRI EF 24% with only small scar in apex  Objective:   Weight Range: 63 kg (138 lb 14.2 oz) Body mass index is 21.12 kg/m.   Vital Signs:   Temp:  [97.6 F (36.4 C)-98.5 F (36.9 C)] 98.5 F (36.9 C) (07/08 0700) Pulse Rate:  [87-108] 108 (07/08 0700) Resp:  [11-29] 27 (07/08 0700) BP: (94-129)/(54-75) 117/62 (07/08 0600) SpO2:  [92 %-98 %] 96 % (07/08 0746) Arterial Line BP: (154)/(63) 154/63 (07/07 1200) Weight:  [63 kg (138 lb 14.2 oz)] 63 kg (138 lb 14.2 oz) (07/08 0700) Last BM Date: 12/06/16  Weight change: Filed Weights   12/08/16 0500 12/09/16 0500 12/10/16 0700  Weight: 56.5 kg (124 lb 9 oz) 62.9 kg (138 lb 9.6 oz) 63 kg (138 lb 14.2 oz)    Intake/Output:   Intake/Output Summary (Last 24 hours) at 12/10/16 1122 Last data filed at 12/10/16 1000  Gross per 24 hour  Intake          1442.01 ml  Output             1430 ml  Net            12.01 ml      Physical Exam    General: Walking hall with support  HEENT: Normal Neck: Supple. JVP 7-8. Carotids 2+  Cor: sternal dressing intact.  RRR no rub  Lungs: clear anteriorly  Abdomen: soft NT/ND hypoactive BS Extremities: no cyanosis, clubbing, rash, trace-1+ edema. SVG sites ok Personally reviewed   Telemetry   NSR 90-100s. Personally reviewed     EKG    EKG from 12/05/16 with sinus tach, Personally reviewed.   Labs    CBC  Recent Labs  12/09/16 1700 12/09/16 2106 12/10/16 0400  WBC 11.5*  --  10.6*  HGB 8.7* 8.8* 8.1*  HCT 26.8* 26.0* 25.2*  MCV 87.0  --  86.6  PLT 156  --  148*   Basic Metabolic Panel  Recent Labs  12/09/16 0400 12/09/16 1700 12/09/16 2106 12/10/16 0400  NA 137  --  136 134*  K 3.6  --  3.9 3.7  CL 108  --  103 104  CO2 20*  --   --  23    GLUCOSE 98  --  140* 109*  BUN 10  --  14 12  CREATININE 0.58 0.81 0.60 0.67  CALCIUM 8.1*  --   --  8.2*  MG 2.3 2.3  --   --    Liver Function Tests No results for input(s): AST, ALT, ALKPHOS, BILITOT, PROT, ALBUMIN in the last 72 hours. No results for input(s): LIPASE, AMYLASE in the last 72 hours. Cardiac Enzymes No results for input(s): CKTOTAL, CKMB, CKMBINDEX, TROPONINI in the last 72 hours.  BNP: BNP (last 3 results)  Recent Labs  12/03/16 1748 12/03/16 2212  BNP 2,721.4* 2,339.4*    ProBNP (last 3 results) No results for input(s): PROBNP in the last 8760 hours.   D-Dimer No results for input(s): DDIMER in the last 72 hours. Hemoglobin A1C No results for input(s): HGBA1C in the last 72 hours. Fasting Lipid Panel No results for input(s): CHOL, HDL, LDLCALC, TRIG, CHOLHDL, LDLDIRECT in the last 72 hours. Thyroid Function Tests  No results for input(s): TSH, T4TOTAL, T3FREE, THYROIDAB in the last 72 hours.  Invalid input(s): FREET3  Other results:   Imaging    Dg Chest Port 1 View  Result Date: 12/10/2016 CLINICAL DATA:  Status post CABG. EXAM: PORTABLE CHEST 1 VIEW COMPARISON:  12/09/2016 FINDINGS: 0510 hours. The cardio pericardial silhouette is enlarged. Left chest tube has been removed in the interval without evidence for pneumothorax. Right IJ pulmonary catheter has been removed with a sheath remains in place. There is a new right IJ central line with the tip overlying the distal SVC. Mediastinal/pericardial drain has been removed. Slight progression of bibasilar atelectasis with probable tiny bilateral pleural effusions. No evidence for overt pulmonary edema. Bones are diffusely demineralized. Telemetry leads overlie the chest. IMPRESSION: 1. Interval removal of pulmonary artery catheter, left chest tube, and mediastinal/pericardial drain. 2. Cardiomegaly with slight increase in basilar atelectasis and persistent tiny pleural effusions. Electronically Signed    By: Kennith Center M.D.   On: 12/10/2016 07:15     Medications:     Scheduled Medications: . acetaminophen  1,000 mg Oral Q6H   Or  . acetaminophen (TYLENOL) oral liquid 160 mg/5 mL  1,000 mg Per Tube Q6H  . aspirin EC  325 mg Oral Daily   Or  . aspirin  324 mg Per Tube Daily  . atorvastatin  80 mg Oral q1800  . bisacodyl  10 mg Oral Daily   Or  . bisacodyl  10 mg Rectal Daily  . buPROPion  300 mg Oral Daily  . Chlorhexidine Gluconate Cloth  6 each Topical Daily  . docusate sodium  200 mg Oral Daily  . enoxaparin (LOVENOX) injection  40 mg Subcutaneous QHS  . furosemide  20 mg Intravenous BID  . insulin aspart  0-9 Units Subcutaneous TID WC  . levalbuterol  1.25 mg Nebulization TID  . mouth rinse  15 mL Mouth Rinse BID  . metoCLOPramide (REGLAN) injection  10 mg Intravenous Q6H  . metoprolol tartrate  12.5 mg Oral BID   Or  . metoprolol tartrate  12.5 mg Per Tube BID  . mometasone-formoterol  2 puff Inhalation BID  . pantoprazole  40 mg Oral Daily  . sertraline  200 mg Oral Daily  . sodium chloride flush  10-40 mL Intracatheter Q12H  . sodium chloride flush  3 mL Intravenous Q12H    Infusions: . sodium chloride Stopped (12/09/16 1339)  . sodium chloride    . sodium chloride Stopped (12/09/16 2000)  . cefUROXime (ZINACEF)  IV 1.5 g (12/10/16 0920)  . dexmedetomidine (PRECEDEX) IV infusion Stopped (12/08/16 1630)  . DOPamine Stopped (12/09/16 1000)  . lactated ringers    . lactated ringers Stopped (12/09/16 1339)  . lactated ringers 20 mL/hr at 12/10/16 0500  . milrinone 0.3 mcg/kg/min (12/10/16 0500)  . nitroGLYCERIN 40 mcg/min (12/08/16 1415)  . phenylephrine (NEO-SYNEPHRINE) Adult infusion Stopped (12/09/16 0508)    PRN Medications: sodium chloride, lactated ringers, levalbuterol, metoprolol tartrate, midazolam, morphine injection, ondansetron (ZOFRAN) IV, oxyCODONE, sodium chloride flush, sodium chloride flush, traMADol    Patient Profile   Latoya Adams is a  71 y/o woman with h/o HTN and tobacco abuse admitted 7/1 with NSTEMI (peak trop 7.95) and acute HF found to have critical proximal LAD lesion with EF 20-25%.   Assessment/Plan   1. NSTEMI/CAD -- Critical pLAD disease with ulcerated plaque at bifurcation with D1 as well as RPDA 90% lesion -- RHC ok. PFTs with severe COPD. Carotid u/s ok  --  POD #2 CABG x 4. Continue post-CABG care.  --On ASA and statin  2. Acute systolic HF -- Echo EF 20-25% with normal RV -- cMRI EF 24% with only small scar in apex. RHC with normal CO and no PAH -- Suspect EF will recover with CABG. -- Now POD #2 CABG x 4.  -- Co-ox marginal on milrinone. Will continue. Wean slowly. Hold b-blocker for now -- Agree with diuresis getting 20 IV bid  3. PAD -- Stable  4. Tobacco use/COPD -- PFTs show severe COPD. Continue pulmonary toilet.    Length of Stay: 7  Arvilla Meres, MD  12/10/2016, 11:22 AM  Advanced Heart Failure Team Pager [phone] (M-F; 7a - 4p)  Please contact [facility]

## 2016-12-10 NOTE — Progress Notes (Signed)
2 Days Post-Op Procedure(s) (LRB): CORONARY ARTERY BYPASS GRAFTING (CABG)x4 using left internal mammary artery and bilateral greater saphenous veins harvested endoscopically (N/A) TRANSESOPHAGEAL ECHOCARDIOGRAM (TEE) (N/A) Subjective: C/o pain after CXR this AM, denies nausea  Objective: Vital signs in last 24 hours: Temp:  [97.6 F (36.4 C)-99 F (37.2 C)] 98.5 F (36.9 C) (07/08 0700) Pulse Rate:  [87-108] 108 (07/08 0700) Cardiac Rhythm: Normal sinus rhythm (07/08 0000) Resp:  [11-29] 27 (07/08 0700) BP: (94-129)/(54-75) 117/62 (07/08 0600) SpO2:  [92 %-98 %] 96 % (07/08 0746) Arterial Line BP: (137-154)/(47-63) 154/63 (07/07 1200) Weight:  [138 lb 14.2 oz (63 kg)] 138 lb 14.2 oz (63 kg) (07/08 0700)  Hemodynamic parameters for last 24 hours: PAP: (26-27)/(13-14) 27/14  Intake/Output from previous day: 07/07 0701 - 07/08 0700 In: 1630.1 [P.O.:540; I.V.:990.1; IV Piggyback:100] Out: 1250 [Urine:1250] Intake/Output this shift: No intake/output data recorded.  General appearance: alert, cooperative and mild distress Neurologic: intact Heart: regular rate and rhythm Lungs: diminished breath sounds bibasilar Abdomen: normal findings: soft, non-tender  Lab Results:  Recent Labs  12/09/16 1700 12/09/16 2106 12/10/16 0400  WBC 11.5*  --  10.6*  HGB 8.7* 8.8* 8.1*  HCT 26.8* 26.0* 25.2*  PLT 156  --  148*   BMET:  Recent Labs  12/09/16 0400  12/09/16 2106 12/10/16 0400  NA 137  --  136 134*  K 3.6  --  3.9 3.7  CL 108  --  103 104  CO2 20*  --   --  23  GLUCOSE 98  --  140* 109*  BUN 10  --  14 12  CREATININE 0.58  < > 0.60 0.67  CALCIUM 8.1*  --   --  8.2*  < > = values in this interval not displayed.  PT/INR:  Recent Labs  12/08/16 1341  LABPROT 16.7*  INR 1.34   ABG    Component Value Date/Time   PHART 7.366 12/08/2016 1844   HCO3 21.8 12/08/2016 1844   TCO2 24 12/09/2016 2106   ACIDBASEDEF 3.0 (H) 12/08/2016 1844   O2SAT 56.8 12/10/2016 0410    CBG (last 3)   Recent Labs  12/09/16 1922 12/09/16 2305 12/10/16 0407  GLUCAP 163* 123* 108*    Assessment/Plan: S/P Procedure(s) (LRB): CORONARY ARTERY BYPASS GRAFTING (CABG)x4 using left internal mammary artery and bilateral greater saphenous veins harvested endoscopically (N/A) TRANSESOPHAGEAL ECHOCARDIOGRAM (TEE) (N/A) -CV- maintaining SR  On milrinone at 0.3 with co-ox of 57   -RESP- IS for atelectasis, diurese  -RENAL- creatinine and lytes OK- will give 20 mg lasix BID to diurese  -ENDO- CBG well controlled- change to AC/HS  -Anemia secondary to ABL- follow  -SCD + enoxaparin for DVT prophylaxis  -Mobilize   LOS: 7 days    Loreli Slot 12/10/2016

## 2016-12-10 NOTE — Progress Notes (Signed)
      [address]       [place] [address]             [phone]      Ambulated twice today so far  BP 105/65   Pulse 100   Temp 98.2 F (36.8 C) (Oral)   Resp 16   Ht 5\' 8"  (1.727 m)   Wt 138 lb 14.2 oz (63 kg)   SpO2 94%   BMI 21.12 kg/m    Intake/Output Summary (Last 24 hours) at 12/10/16 1929 Last data filed at 12/10/16 1700  Gross per 24 hour  Intake           631.54 ml  Output              850 ml  Net          -218.46 ml   CVP= 11  Some PVCs- check potassium  Continue milrinone  Viviann Spare C. Dorris Fetch, MD [facility] [phone]

## 2016-12-11 ENCOUNTER — Inpatient Hospital Stay (HOSPITAL_COMMUNITY): Payer: Medicare PPO

## 2016-12-11 ENCOUNTER — Encounter (HOSPITAL_COMMUNITY): Payer: Self-pay | Admitting: Cardiothoracic Surgery

## 2016-12-11 LAB — BASIC METABOLIC PANEL
ANION GAP: 5 (ref 5–15)
BUN: 13 mg/dL (ref 6–20)
CHLORIDE: 120 mmol/L — AB (ref 101–111)
CO2: 16 mmol/L — ABNORMAL LOW (ref 22–32)
Calcium: 5.6 mg/dL — CL (ref 8.9–10.3)
Creatinine, Ser: 0.32 mg/dL — ABNORMAL LOW (ref 0.44–1.00)
Glucose, Bld: 87 mg/dL (ref 65–99)
POTASSIUM: 3 mmol/L — AB (ref 3.5–5.1)
SODIUM: 141 mmol/L (ref 135–145)

## 2016-12-11 LAB — BPAM RBC
Blood Product Expiration Date: 201807232359
Blood Product Expiration Date: 201807242359
Blood Product Expiration Date: 201807252359
Blood Product Expiration Date: 201807262359
ISSUE DATE / TIME: 201807060719
ISSUE DATE / TIME: 201807060719
ISSUE DATE / TIME: 201807060719
ISSUE DATE / TIME: 201807060719
Unit Type and Rh: 1700
Unit Type and Rh: 1700
Unit Type and Rh: 9500
Unit Type and Rh: 9500

## 2016-12-11 LAB — GLUCOSE, CAPILLARY
GLUCOSE-CAPILLARY: 97 mg/dL (ref 65–99)
GLUCOSE-CAPILLARY: 108 mg/dL — AB (ref 65–99)
Glucose-Capillary: 137 mg/dL — ABNORMAL HIGH (ref 65–99)
Glucose-Capillary: 112 mg/dL — ABNORMAL HIGH (ref 65–99)
Glucose-Capillary: 106 mg/dL — ABNORMAL HIGH (ref 65–99)
Glucose-Capillary: 111 mg/dL — ABNORMAL HIGH (ref 65–99)

## 2016-12-11 LAB — CBC
HCT: 23.1 % — ABNORMAL LOW (ref 36.0–46.0)
HEMOGLOBIN: 7.4 g/dL — AB (ref 12.0–15.0)
MCH: 28 pg (ref 26.0–34.0)
MCHC: 32 g/dL (ref 30.0–36.0)
MCV: 87.5 fL (ref 78.0–100.0)
PLATELETS: 179 10*3/uL (ref 150–400)
RBC: 2.64 MIL/uL — AB (ref 3.87–5.11)
RDW: 15.2 % (ref 11.5–15.5)
WBC: 9.5 10*3/uL (ref 4.0–10.5)

## 2016-12-11 LAB — TYPE AND SCREEN
ABO/RH(D): B NEG
ABO/RH(D): B NEG
Antibody Screen: NEGATIVE
Antibody Screen: NEGATIVE
Unit division: 0
Unit division: 0
Unit division: 0
Unit division: 0

## 2016-12-11 LAB — POCT I-STAT, CHEM 8
BUN: 12 mg/dL (ref 6–20)
BUN: 13 mg/dL (ref 6–20)
CALCIUM ION: 1.05 mmol/L — AB (ref 1.15–1.40)
CHLORIDE: 107 mmol/L (ref 101–111)
Calcium, Ion: 1.06 mmol/L — ABNORMAL LOW (ref 1.15–1.40)
Chloride: 104 mmol/L (ref 101–111)
Creatinine, Ser: 0.3 mg/dL — ABNORMAL LOW (ref 0.44–1.00)
Creatinine, Ser: 0.4 mg/dL — ABNORMAL LOW (ref 0.44–1.00)
Glucose, Bld: 102 mg/dL — ABNORMAL HIGH (ref 65–99)
Glucose, Bld: 103 mg/dL — ABNORMAL HIGH (ref 65–99)
HCT: 19 % — ABNORMAL LOW (ref 36.0–46.0)
HCT: 27 % — ABNORMAL LOW (ref 36.0–46.0)
Hemoglobin: 6.5 g/dL — CL (ref 12.0–15.0)
Hemoglobin: 9.2 g/dL — ABNORMAL LOW (ref 12.0–15.0)
Potassium: 3.3 mmol/L — ABNORMAL LOW (ref 3.5–5.1)
Potassium: 3.9 mmol/L (ref 3.5–5.1)
SODIUM: 141 mmol/L (ref 135–145)
Sodium: 146 mmol/L — ABNORMAL HIGH (ref 135–145)
TCO2: 19 mmol/L (ref 0–100)
TCO2: 19 mmol/L (ref 0–100)

## 2016-12-11 LAB — COOXEMETRY PANEL
Carboxyhemoglobin: 1.7 % — ABNORMAL HIGH (ref 0.5–1.5)
METHEMOGLOBIN: 1.2 % (ref 0.0–1.5)
O2 SAT: 60.9 %
TOTAL HEMOGLOBIN: 4.2 g/dL — AB (ref 12.0–16.0)

## 2016-12-11 MED ORDER — POTASSIUM CHLORIDE 10 MEQ/50ML IV SOLN
10.0000 meq | INTRAVENOUS | Status: DC | PRN
  Administered 2016-12-11 (×2): 10 meq via INTRAVENOUS
  Filled 2016-12-11 (×2): qty 50

## 2016-12-11 MED ORDER — METOLAZONE 5 MG PO TABS
5.0000 mg | ORAL_TABLET | Freq: Every day | ORAL | Status: DC
  Administered 2016-12-11: 5 mg via ORAL
  Filled 2016-12-11: qty 1

## 2016-12-11 MED ORDER — POTASSIUM CHLORIDE CRYS ER 20 MEQ PO TBCR
20.0000 meq | EXTENDED_RELEASE_TABLET | Freq: Two times a day (BID) | ORAL | Status: DC
  Administered 2016-12-11 (×2): 20 meq via ORAL
  Filled 2016-12-11 (×2): qty 1

## 2016-12-11 MED ORDER — CARVEDILOL 3.125 MG PO TABS
3.1250 mg | ORAL_TABLET | Freq: Two times a day (BID) | ORAL | Status: DC
  Administered 2016-12-11 – 2016-12-15 (×8): 3.125 mg via ORAL
  Filled 2016-12-11 (×8): qty 1

## 2016-12-11 MED ORDER — FE FUMARATE-B12-VIT C-FA-IFC PO CAPS
1.0000 | ORAL_CAPSULE | Freq: Three times a day (TID) | ORAL | Status: DC
  Administered 2016-12-11 – 2016-12-15 (×13): 1 via ORAL
  Filled 2016-12-11 (×15): qty 1

## 2016-12-11 MED ORDER — POTASSIUM CHLORIDE 10 MEQ/50ML IV SOLN
INTRAVENOUS | Status: AC
  Administered 2016-12-11: 10 meq
  Filled 2016-12-11: qty 50

## 2016-12-11 MED ORDER — MILRINONE LACTATE IN DEXTROSE 20-5 MG/100ML-% IV SOLN
0.1250 ug/kg/min | INTRAVENOUS | Status: DC
  Administered 2016-12-11: 0.125 ug/kg/min via INTRAVENOUS

## 2016-12-11 MED ORDER — SODIUM CHLORIDE 0.9 % IV SOLN: INTRAVENOUS | Status: DC

## 2016-12-11 MED ORDER — POTASSIUM CHLORIDE 10 MEQ/50ML IV SOLN
10.0000 meq | INTRAVENOUS | Status: AC
  Administered 2016-12-11 (×3): 10 meq via INTRAVENOUS
  Filled 2016-12-11 (×3): qty 50

## 2016-12-11 MED ORDER — MILRINONE LACTATE IN DEXTROSE 20-5 MG/100ML-% IV SOLN
0.2500 ug/kg/min | INTRAVENOUS | Status: DC
  Administered 2016-12-11: 0.25 ug/kg/min via INTRAVENOUS
  Filled 2016-12-11: qty 100

## 2016-12-11 MED ORDER — FUROSEMIDE 10 MG/ML IJ SOLN
40.0000 mg | Freq: Every day | INTRAMUSCULAR | Status: DC
  Administered 2016-12-11: 40 mg via INTRAVENOUS
  Filled 2016-12-11: qty 4

## 2016-12-11 MED ORDER — SPIRONOLACTONE 25 MG PO TABS
12.5000 mg | ORAL_TABLET | Freq: Every day | ORAL | Status: DC
  Administered 2016-12-11: 12.5 mg via ORAL
  Filled 2016-12-11: qty 1

## 2016-12-11 MED ORDER — ENOXAPARIN SODIUM 30 MG/0.3ML ~~LOC~~ SOLN
30.0000 mg | Freq: Every day | SUBCUTANEOUS | Status: DC
  Administered 2016-12-11 – 2016-12-14 (×4): 30 mg via SUBCUTANEOUS
  Filled 2016-12-11 (×4): qty 0.3

## 2016-12-11 NOTE — Progress Notes (Signed)
CT surgery p.m. Rounds  Patient had good day ambulating well Receive 1 unit of packed cells hemoglobin improved to 9.2 Continue to wean milrinone and follow mixed venous saturation DC Foley and transfer to stepdown tomorrow

## 2016-12-11 NOTE — Progress Notes (Signed)
3 Days Post-Op Procedure(s) (LRB): CORONARY ARTERY BYPASS GRAFTING (CABG)x4 using left internal mammary artery and bilateral greater saphenous veins harvested endoscopically (N/A) TRANSESOPHAGEAL ECHOCARDIOGRAM (TEE) (N/A) Subjective: Ischemic CM Postop anemia COPD deconditioning  Objective: Vital signs in last 24 hours: Temp:  [97.6 F (36.4 C)-98.2 F (36.8 C)] 97.6 F (36.4 C) (07/09 0743) Pulse Rate:  [48-107] 92 (07/09 0800) Cardiac Rhythm: Normal sinus rhythm (07/09 0400) Resp:  [16-34] 22 (07/09 0800) BP: (97-127)/(53-96) 118/63 (07/09 0800) SpO2:  [94 %-99 %] 99 % (07/09 0800) Weight:  [139 lb 8.8 oz (63.3 kg)] 139 lb 8.8 oz (63.3 kg) (07/09 0400)  Hemodynamic parameters for last 24 hours: CVP:  [9 mmHg-31 mmHg] 9 mmHg  Intake/Output from previous day: 07/08 0701 - 07/09 0700 In: 522.4 [I.V.:172.4; IV Piggyback:350] Out: 1040 [Urine:1040] Intake/Output this shift: No intake/output data recorded.       Exam    General- alert and comfortable   Lungs- clear without rales, wheezes   Cor- regular rate and rhythm, no murmur , gallop   Abdomen- soft, non-tender   Extremities - warm, non-tender, minimal edema   Neuro- oriented, appropriate, no focal weakness   Lab Results:  Recent Labs  12/10/16 0400  12/11/16 0110 12/11/16 0630  WBC 10.6*  --   --  9.5  HGB 8.1*  < > 6.5* 7.4*  HCT 25.2*  < > 19.0* 23.1*  PLT 148*  --   --  179  < > = values in this interval not displayed. BMET:  Recent Labs  12/10/16 0400  12/11/16 0110 12/11/16 0630  NA 134*  < > 141 141  K 3.7  < > 3.3* 3.0*  CL 104  --  107 120*  CO2 23  --   --  16*  GLUCOSE 109*  < > 102* 87  BUN 12  --  12 13  CREATININE 0.67  --  0.30* 0.32*  CALCIUM 8.2*  --   --  5.6*  < > = values in this interval not displayed.  PT/INR:  Recent Labs  12/08/16 1341  LABPROT 16.7*  INR 1.34   ABG    Component Value Date/Time   PHART 7.366 12/08/2016 1844   HCO3 21.8 12/08/2016 1844   TCO2 19  12/11/2016 0110   ACIDBASEDEF 3.0 (H) 12/08/2016 1844   O2SAT 60.9 12/11/2016 0630   CBG (last 3)   Recent Labs  12/10/16 1957 12/11/16 0542 12/11/16 0738  GLUCAP 147* 108* 112*    Assessment/Plan: S/P Procedure(s) (LRB): CORONARY ARTERY BYPASS GRAFTING (CABG)x4 using left internal mammary artery and bilateral greater saphenous veins harvested endoscopically (N/A) TRANSESOPHAGEAL ECHOCARDIOGRAM (TEE) (N/A) Slow wean milrinone  Diuresis PT consult  LOS: 8 days    Latoya Adams 12/11/2016

## 2016-12-11 NOTE — Progress Notes (Signed)
Advanced Heart Failure Rounding Note  Primary Cardiologist: Excell Seltzer  Subjective:    Underwent CABG x 4 on 7/6.   Yesterday diuresed with 20 mg IV lasix twice a day. Negative 517.   Todays CO-OX is 61%. Milrinone was cut back to 0.25 mcg this morning. Also diuretics adjusted.    cMRI EF 24% with only small scar in apex  Objective:   Weight Range: 139 lb 8.8 oz (63.3 kg) Body mass index is 21.22 kg/m.   Vital Signs:   Temp:  [97.6 F (36.4 C)-98.2 F (36.8 C)] 97.6 F (36.4 C) (07/09 0743) Pulse Rate:  [48-107] 100 (07/09 0900) Resp:  [16-34] 24 (07/09 0900) BP: (97-127)/(53-96) 119/71 (07/09 0900) SpO2:  [94 %-99 %] 99 % (07/09 0900) Weight:  [139 lb 8.8 oz (63.3 kg)] 139 lb 8.8 oz (63.3 kg) (07/09 0400) Last BM Date: 12/10/16  Weight change: Filed Weights   12/09/16 0500 12/10/16 0700 12/11/16 0400  Weight: 138 lb 9.6 oz (62.9 kg) 138 lb 14.2 oz (63 kg) 139 lb 8.8 oz (63.3 kg)    Intake/Output:   Intake/Output Summary (Last 24 hours) at 12/11/16 0929 Last data filed at 12/11/16 0900  Gross per 24 hour  Intake            441.5 ml  Output             1090 ml  Net           -648.5 ml      Physical Exam   CVP ~10 General: Elderly . No resp difficulty. Sitting in the chair.  HEENT: normal Neck: supple. JVP ~10 Carotids 2+ bilat; no bruits. No lymphadenopathy or thryomegaly appreciated. RIJ Cor: PMI nondisplaced. Regular rate & rhythm. No rubs, gallops or murmurs. Sternal dressing intact  Lungs: Decreased in the bases on room air.  Abdomen: soft, nontender, nondistended. No hepatosplenomegaly. No bruits or masses. Good bowel sounds. Extremities: no cyanosis, clubbing, rash, R and LLE 2+ edema Neuro: alert & orientedx3, cranial nerves grossly intact. moves all 4 extremities w/o difficulty. Affect flat GU: Foley: yellow urine.    Telemetry   Sinus tach 100s. Personally reviewed.     EKG    EKG from 12/05/16 with sinus tach, Personally reviewed.    Labs    CBC  Recent Labs  12/10/16 0400  12/11/16 0110 12/11/16 0630  WBC 10.6*  --   --  9.5  HGB 8.1*  < > 6.5* 7.4*  HCT 25.2*  < > 19.0* 23.1*  MCV 86.6  --   --  87.5  PLT 148*  --   --  179  < > = values in this interval not displayed. Basic Metabolic Panel  Recent Labs  12/09/16 0400 12/09/16 1700  12/10/16 0400  12/11/16 0110 12/11/16 0630  NA 137  --   < > 134*  < > 141 141  K 3.6  --   < > 3.7  < > 3.3* 3.0*  CL 108  --   < > 104  --  107 120*  CO2 20*  --   --  23  --   --  16*  GLUCOSE 98  --   < > 109*  < > 102* 87  BUN 10  --   < > 12  --  12 13  CREATININE 0.58 0.81  < > 0.67  --  0.30* 0.32*  CALCIUM 8.1*  --   --  8.2*  --   --  5.6*  MG 2.3 2.3  --   --   --   --   --   < > = values in this interval not displayed. Liver Function Tests No results for input(s): AST, ALT, ALKPHOS, BILITOT, PROT, ALBUMIN in the last 72 hours. No results for input(s): LIPASE, AMYLASE in the last 72 hours. Cardiac Enzymes No results for input(s): CKTOTAL, CKMB, CKMBINDEX, TROPONINI in the last 72 hours.  BNP: BNP (last 3 results)  Recent Labs  12/03/16 1748 12/03/16 2212  BNP 2,721.4* 2,339.4*    ProBNP (last 3 results) No results for input(s): PROBNP in the last 8760 hours.   D-Dimer No results for input(s): DDIMER in the last 72 hours. Hemoglobin A1C No results for input(s): HGBA1C in the last 72 hours. Fasting Lipid Panel No results for input(s): CHOL, HDL, LDLCALC, TRIG, CHOLHDL, LDLDIRECT in the last 72 hours. Thyroid Function Tests No results for input(s): TSH, T4TOTAL, T3FREE, THYROIDAB in the last 72 hours.  Invalid input(s): FREET3  Other results:   Imaging    Dg Chest Port 1 View  Result Date: 12/11/2016 CLINICAL DATA:  Atelectasis.  Sore chest.  CABG. EXAM: PORTABLE CHEST 1 VIEW COMPARISON:  12/10/2016 FINDINGS: Right jugular dialysis catheter and right jugular introducer sheath are stable. There is no pneumothorax. The heart remains  moderately enlarged. Bibasilar atelectasis and bilateral pleural effusions are not significantly changed. Pulmonary vascularity is within normal limits. IMPRESSION: Stable bilateral pleural effusions and bibasilar atelectasis. No evidence of pneumothorax. Electronically Signed   By: Jolaine Click M.D.   On: 12/11/2016 07:37     Medications:     Scheduled Medications: . acetaminophen  1,000 mg Oral Q6H   Or  . acetaminophen (TYLENOL) oral liquid 160 mg/5 mL  1,000 mg Per Tube Q6H  . aspirin EC  325 mg Oral Daily   Or  . aspirin  324 mg Per Tube Daily  . atorvastatin  80 mg Oral q1800  . bisacodyl  10 mg Oral Daily   Or  . bisacodyl  10 mg Rectal Daily  . buPROPion  300 mg Oral Daily  . Chlorhexidine Gluconate Cloth  6 each Topical Daily  . docusate sodium  200 mg Oral Daily  . enoxaparin (LOVENOX) injection  30 mg Subcutaneous QHS  . ferrous fumarate-b12-vitamic C-folic acid  1 capsule Oral TID PC  . furosemide  40 mg Intravenous Daily  . insulin aspart  0-9 Units Subcutaneous TID WC  . levalbuterol  1.25 mg Nebulization TID  . mouth rinse  15 mL Mouth Rinse BID  . metoCLOPramide (REGLAN) injection  10 mg Intravenous Q6H  . metolazone  5 mg Oral Daily  . metoprolol tartrate  12.5 mg Oral BID   Or  . metoprolol tartrate  12.5 mg Per Tube BID  . mometasone-formoterol  2 puff Inhalation BID  . pantoprazole  40 mg Oral Daily  . potassium chloride  20 mEq Oral BID  . sertraline  200 mg Oral Daily  . sodium chloride flush  10-40 mL Intracatheter Q12H  . sodium chloride flush  3 mL Intravenous Q12H    Infusions: . sodium chloride Stopped (12/09/16 1339)  . sodium chloride    . sodium chloride Stopped (12/09/16 2000)  . lactated ringers    . lactated ringers 20 mL/hr at 12/10/16 0500  . milrinone 0.25 mcg/kg/min (12/11/16 0813)  . phenylephrine (NEO-SYNEPHRINE) Adult infusion Stopped (12/09/16 0508)  . potassium chloride 10 mEq (12/11/16 0817)  . potassium chloride  PRN  Medications: sodium chloride, lactated ringers, levalbuterol, metoprolol tartrate, midazolam, morphine injection, ondansetron (ZOFRAN) IV, oxyCODONE, potassium chloride, sodium chloride flush, sodium chloride flush, traMADol    Patient Profile   Ms. Kalicki is a 71 y/o woman with h/o HTN and tobacco abuse admitted 7/1 with NSTEMI (peak trop 7.95) and acute HF found to have critical proximal LAD lesion with EF 20-25%.   Assessment/Plan   1. NSTEMI/CAD -- Critical pLAD disease with ulcerated plaque at bifurcation with D1 as well as RPDA 90% lesion.  RHC ok. PFTs with severe COPD. Carotid u/s ok.  S/P CABG x4 . On ASA and statin  2. Acute systolic HF -- Echo EF 20-25% with normal RV -- cMRI EF 24% with only small scar in apex. RHC with normal CO and no PAH -- Suspect EF will recover with CABG. --CABG x 4. -- Todays CO-OX 61%. Milrinone cut back to 0.25 mcg this morning.  - Stop metoprolol. Start low dose carvedilol 3.125 mg twice a day.  - Lasix increase to 40 mg daily and metolazone x1 per Dr Maren Beach.  - Add 12.5 mg spiro daily.  - Renal function stable.   3. PAD -- Stable  4. Tobacco use/COPD -- PFTs show severe COPD. Continue pulmonary toilet.   5. Hypokalemia- Supplement K.   6. Anemia - hgb 6.5>7.4.  Give blood per dr Dr Maren Beach.    Length of Stay: 8  [name], NP  12/11/2016, 9:29 AM  Advanced Heart Failure Team Pager [phone] (M-F; 7a - 4p)  Please contact [facility]  Patient seen and examined with Tonye Becket, NP. We discussed all aspects of the encounter. I agree with the assessment and plan as stated above.   She is improving. Ambulating ward. CP improved. Co-ox ok. Agree with turning milrinone to 0.25. Volume status looks good.   Will get 1u RBCs today. Continue to wean milrinone and ambulate.   Arvilla Meres, MD  11:32 PM

## 2016-12-11 NOTE — Progress Notes (Signed)
   12/11/16 1141  Clinical Encounter Type  Visited With Patient and family together  Visit Type Initial;Spiritual support;Social support  Chaplain stopped in while rounding the floor. Patient's family member who was at bedside requested prayer.  Chaplain provided ministry of presence and prayer.  Chaplain will remain available as needed.

## 2016-12-12 ENCOUNTER — Inpatient Hospital Stay (HOSPITAL_COMMUNITY): Payer: Medicare PPO

## 2016-12-12 LAB — BASIC METABOLIC PANEL
Anion gap: 11 (ref 5–15)
BUN: 11 mg/dL (ref 6–20)
CO2: 23 mmol/L (ref 22–32)
Calcium: 8.3 mg/dL — ABNORMAL LOW (ref 8.9–10.3)
Chloride: 102 mmol/L (ref 101–111)
Creatinine, Ser: 0.59 mg/dL (ref 0.44–1.00)
GFR calc Af Amer: >60 mL/min (ref >60)
GFR calc non Af Amer: >60 mL/min (ref >60)
Glucose, Bld: 105 mg/dL — ABNORMAL HIGH (ref 65–99)
Potassium: 3.5 mmol/L (ref 3.5–5.1)
Sodium: 136 mmol/L (ref 135–145)

## 2016-12-12 LAB — GLUCOSE, CAPILLARY
GLUCOSE-CAPILLARY: 102 mg/dL — AB (ref 65–99)
GLUCOSE-CAPILLARY: 110 mg/dL — AB (ref 65–99)
GLUCOSE-CAPILLARY: 93 mg/dL (ref 65–99)
Glucose-Capillary: 93 mg/dL (ref 65–99)
Glucose-Capillary: 86 mg/dL (ref 65–99)

## 2016-12-12 LAB — CBC
HCT: 26.5 % — ABNORMAL LOW (ref 36.0–46.0)
Hemoglobin: 8.6 g/dL — ABNORMAL LOW (ref 12.0–15.0)
MCH: 28.3 pg (ref 26.0–34.0)
MCHC: 32.5 g/dL (ref 30.0–36.0)
MCV: 87.2 fL (ref 78.0–100.0)
Platelets: 279 10*3/uL (ref 150–400)
RBC: 3.04 MIL/uL — ABNORMAL LOW (ref 3.87–5.11)
RDW: 15 % (ref 11.5–15.5)
WBC: 8.8 10*3/uL (ref 4.0–10.5)

## 2016-12-12 LAB — COOXEMETRY PANEL
CARBOXYHEMOGLOBIN: 1.4 % (ref 0.5–1.5)
METHEMOGLOBIN: 1 % (ref 0.0–1.5)
O2 Saturation: 56.8 %
Total hemoglobin: 8.7 g/dL — ABNORMAL LOW (ref 12.0–16.0)

## 2016-12-12 MED ORDER — POTASSIUM CHLORIDE CRYS ER 20 MEQ PO TBCR
40.0000 meq | EXTENDED_RELEASE_TABLET | Freq: Two times a day (BID) | ORAL | Status: DC
  Administered 2016-12-12 (×2): 40 meq via ORAL
  Filled 2016-12-12 (×2): qty 2

## 2016-12-12 MED ORDER — INSULIN ASPART 100 UNIT/ML ~~LOC~~ SOLN: 0.0000 [IU] | Freq: Three times a day (TID) | SUBCUTANEOUS | Status: DC

## 2016-12-12 MED ORDER — SODIUM CHLORIDE 0.9 % IV SOLN: 250.0000 mL | INTRAVENOUS | Status: DC | PRN

## 2016-12-12 MED ORDER — MOVING RIGHT ALONG BOOK
Freq: Once | Status: AC
  Administered 2016-12-12: 09:00:00
  Filled 2016-12-12: qty 1

## 2016-12-12 MED ORDER — SPIRONOLACTONE 25 MG PO TABS
25.0000 mg | ORAL_TABLET | Freq: Every day | ORAL | Status: DC
  Administered 2016-12-12 – 2016-12-15 (×4): 25 mg via ORAL
  Filled 2016-12-12 (×4): qty 1

## 2016-12-12 MED ORDER — SODIUM CHLORIDE 0.9% FLUSH
3.0000 mL | Freq: Two times a day (BID) | INTRAVENOUS | Status: DC
  Administered 2016-12-12 – 2016-12-14 (×4): 3 mL via INTRAVENOUS

## 2016-12-12 MED ORDER — DIGOXIN 125 MCG PO TABS
0.1250 mg | ORAL_TABLET | Freq: Every day | ORAL | Status: DC
  Administered 2016-12-12 – 2016-12-15 (×4): 0.125 mg via ORAL
  Filled 2016-12-12 (×4): qty 1

## 2016-12-12 MED ORDER — SODIUM CHLORIDE 0.9% FLUSH: 3.0000 mL | INTRAVENOUS | Status: DC | PRN

## 2016-12-12 MED ORDER — OXYCODONE HCL 5 MG PO TABS: 5.0000 mg | ORAL_TABLET | ORAL | Status: DC | PRN

## 2016-12-12 MED ORDER — MAGNESIUM HYDROXIDE 400 MG/5ML PO SUSP: 30.0000 mL | Freq: Every day | ORAL | Status: DC | PRN

## 2016-12-12 MED ORDER — FUROSEMIDE 40 MG PO TABS
40.0000 mg | ORAL_TABLET | Freq: Every day | ORAL | Status: DC
  Administered 2016-12-12 – 2016-12-15 (×4): 40 mg via ORAL
  Filled 2016-12-12 (×4): qty 1

## 2016-12-12 MED FILL — Heparin Sodium (Porcine) Inj 1000 Unit/ML: INTRAMUSCULAR | Qty: 30 | Status: AC

## 2016-12-12 MED FILL — Dexmedetomidine HCl in NaCl 0.9% IV Soln 400 MCG/100ML: INTRAVENOUS | Qty: 100 | Status: AC

## 2016-12-12 MED FILL — Mannitol IV Soln 20%: INTRAVENOUS | Qty: 500 | Status: AC

## 2016-12-12 MED FILL — Lidocaine HCl IV Inj 20 MG/ML: INTRAVENOUS | Qty: 5 | Status: AC

## 2016-12-12 MED FILL — Sodium Chloride IV Soln 0.9%: INTRAVENOUS | Qty: 2000 | Status: AC

## 2016-12-12 MED FILL — Electrolyte-R (PH 7.4) Solution: INTRAVENOUS | Qty: 3000 | Status: AC

## 2016-12-12 MED FILL — Sodium Bicarbonate IV Soln 8.4%: INTRAVENOUS | Qty: 50 | Status: AC

## 2016-12-12 MED FILL — Calcium Chloride Inj 10%: INTRAVENOUS | Qty: 10 | Status: AC

## 2016-12-12 NOTE — Progress Notes (Signed)
Orders to removed R IJ. Removed per protocol. Minimal, tan drainage noted around site. Cleansed with iodine solution. Pt instructed to lay flat until 0915. Site CDI at this time.

## 2016-12-12 NOTE — Progress Notes (Signed)
Pt transferred to 4E11 per MD order. Pt transferred via wheelchair with personal belongings. Family at bedside and aware of transfer.

## 2016-12-12 NOTE — Progress Notes (Signed)
4 Days Post-Op Procedure(s) (LRB): CORONARY ARTERY BYPASS GRAFTING (CABG)x4 using left internal mammary artery and bilateral greater saphenous veins harvested endoscopically (N/A) TRANSESOPHAGEAL ECHOCARDIOGRAM (TEE) (N/A) Subjective: CABG for nonstemi, ischemic cardiomyopathy nsr Doing well with milrinone wean tx stepdown Objective: Vital signs in last 24 hours: Temp:  [97.7 F (36.5 C)-98.1 F (36.7 C)] 98.1 F (36.7 C) (07/10 0700) Pulse Rate:  [98-113] 102 (07/10 0700) Cardiac Rhythm: Sinus tachycardia (07/09 2045) Resp:  [17-30] 21 (07/10 0700) BP: (108-146)/(55-116) 141/73 (07/10 0700) SpO2:  [90 %-99 %] 94 % (07/10 0700) Weight:  [133 lb 14.4 oz (60.7 kg)] 133 lb 14.4 oz (60.7 kg) (07/10 0500)  Hemodynamic parameters for last 24 hours: CVP:  [5 mmHg] 5 mmHg  Intake/Output from previous day: 07/09 0701 - 07/10 0700 In: 310.1 [I.V.:200.1; IV Piggyback:110] Out: 2275 [Urine:2275] Intake/Output this shift: Total I/O In: 2.1 [I.V.:2.1] Out: -        Exam    General- alert and comfortable   Lungs- clear without rales, wheezes   Cor- regular rate and rhythm, no murmur , gallop   Abdomen- soft, non-tender   Extremities - warm, non-tender, minimal edema   Neuro- oriented, appropriate, no focal weakness   Lab Results:  Recent Labs  12/11/16 0630 12/11/16 1634 12/12/16 0500  WBC 9.5  --  8.8  HGB 7.4* 9.2* 8.6*  HCT 23.1* 27.0* 26.5*  PLT 179  --  279   BMET:  Recent Labs  12/11/16 0630 12/11/16 1634 12/12/16 0500  NA 141 146* 136  K 3.0* 3.9 3.5  CL 120* 104 102  CO2 16*  --  23  GLUCOSE 87 103* 105*  BUN 13 13 11   CREATININE 0.32* 0.40* 0.59  CALCIUM 5.6*  --  8.3*    PT/INR: No results for input(s): LABPROT, INR in the last 72 hours. ABG    Component Value Date/Time   PHART 7.366 12/08/2016 1844   HCO3 21.8 12/08/2016 1844   TCO2 19 12/11/2016 1634   ACIDBASEDEF 3.0 (H) 12/08/2016 1844   O2SAT 56.8 12/12/2016 0530   CBG (last 3)    Recent Labs  12/11/16 1119 12/11/16 1616 12/12/16 0737  GLUCAP 106* 111* 93    Assessment/Plan: S/P Procedure(s) (LRB): CORONARY ARTERY BYPASS GRAFTING (CABG)x4 using left internal mammary artery and bilateral greater saphenous veins harvested endoscopically (N/A) TRANSESOPHAGEAL ECHOCARDIOGRAM (TEE) (N/A) Mobilize Diuresis d/c tubes/lines Plan for transfer to step-down: see transfer orders   LOS: 9 days    Latoya Adams 12/12/2016

## 2016-12-12 NOTE — Progress Notes (Signed)
Advanced Heart Failure Rounding Note  Primary Cardiologist: Excell Seltzer  Subjective:    Underwent CABG x 4 on 7/6.   Yesterday milrinone cut back to 0.125 mcg. Diuresed with IV lasix + metolazone. Weight down 6 pounds. CVP down to 5. Also received 1UPRBCs. Hgb up to 8.6.  Overall feeling better. Denies pain. Anxious when she moves around.    cMRI EF 24% with only small scar in apex  Objective:   Weight Range: 133 lb 14.4 oz (60.7 kg) Body mass index is 20.36 kg/m.   Vital Signs:   Temp:  [97.6 F (36.4 C)-98.1 F (36.7 C)] 98.1 F (36.7 C) (07/10 0700) Pulse Rate:  [92-113] 102 (07/10 0700) Resp:  [17-30] 21 (07/10 0700) BP: (108-146)/(55-116) 141/73 (07/10 0700) SpO2:  [90 %-99 %] 94 % (07/10 0700) Weight:  [133 lb 14.4 oz (60.7 kg)] 133 lb 14.4 oz (60.7 kg) (07/10 0500) Last BM Date: 12/11/16  Weight change: Filed Weights   12/10/16 0700 12/11/16 0400 12/12/16 0500  Weight: 138 lb 14.2 oz (63 kg) 139 lb 8.8 oz (63.3 kg) 133 lb 14.4 oz (60.7 kg)    Intake/Output:   Intake/Output Summary (Last 24 hours) at 12/12/16 0740 Last data filed at 12/12/16 0700  Gross per 24 hour  Intake            310.1 ml  Output             2275 ml  Net          -1964.9 ml      Physical Exam  CVP 5  General:  Well appearing. No resp difficulty. Sitting in the chair.  HEENT: normal Neck: supple. JVP 5-6. Carotids 2+ bilat; no bruits. No lymphadenopathy or thryomegaly appreciated. RIJ Cor: PMI nondisplaced. Regular rate & rhythm. No rubs, gallops or murmurs. Sternal incision approximated.  Lungs: clear Abdomen: soft, nontender, nondistended. No hepatosplenomegaly. No bruits or masses. Good bowel sounds. Extremities: no cyanosis, clubbing, rash, edema Neuro: alert & orientedx3, cranial nerves grossly intact. moves all 4 extremities w/o difficulty. Affect pleasant     Telemetry  Sinus Tach 100s    EKG    EKG from 12/05/16 with sinus tach, Personally reviewed.   Labs      CBC  Recent Labs  12/11/16 0630 12/11/16 1634 12/12/16 0500  WBC 9.5  --  8.8  HGB 7.4* 9.2* 8.6*  HCT 23.1* 27.0* 26.5*  MCV 87.5  --  87.2  PLT 179  --  279   Basic Metabolic Panel  Recent Labs  12/09/16 1700  12/11/16 0630 12/11/16 1634 12/12/16 0500  NA  --   < > 141 146* 136  K  --   < > 3.0* 3.9 3.5  CL  --   < > 120* 104 102  CO2  --   < > 16*  --  23  GLUCOSE  --   < > 87 103* 105*  BUN  --   < > 13 13 11   CREATININE 0.81  < > 0.32* 0.40* 0.59  CALCIUM  --   < > 5.6*  --  8.3*  MG 2.3  --   --   --   --   < > = values in this interval not displayed. Liver Function Tests No results for input(s): AST, ALT, ALKPHOS, BILITOT, PROT, ALBUMIN in the last 72 hours. No results for input(s): LIPASE, AMYLASE in the last 72 hours. Cardiac Enzymes No results for input(s): CKTOTAL, CKMB,  CKMBINDEX, TROPONINI in the last 72 hours.  BNP: BNP (last 3 results)  Recent Labs  12/03/16 1748 12/03/16 2212  BNP 2,721.4* 2,339.4*    ProBNP (last 3 results) No results for input(s): PROBNP in the last 8760 hours.   D-Dimer No results for input(s): DDIMER in the last 72 hours. Hemoglobin A1C No results for input(s): HGBA1C in the last 72 hours. Fasting Lipid Panel No results for input(s): CHOL, HDL, LDLCALC, TRIG, CHOLHDL, LDLDIRECT in the last 72 hours. Thyroid Function Tests No results for input(s): TSH, T4TOTAL, T3FREE, THYROIDAB in the last 72 hours.  Invalid input(s): FREET3  Other results:   Imaging    No results found.   Medications:     Scheduled Medications: . acetaminophen  1,000 mg Oral Q6H   Or  . acetaminophen (TYLENOL) oral liquid 160 mg/5 mL  1,000 mg Per Tube Q6H  . aspirin EC  325 mg Oral Daily   Or  . aspirin  324 mg Per Tube Daily  . atorvastatin  80 mg Oral q1800  . bisacodyl  10 mg Oral Daily   Or  . bisacodyl  10 mg Rectal Daily  . buPROPion  300 mg Oral Daily  . carvedilol  3.125 mg Oral BID WC  . Chlorhexidine Gluconate  Cloth  6 each Topical Daily  . docusate sodium  200 mg Oral Daily  . enoxaparin (LOVENOX) injection  30 mg Subcutaneous QHS  . ferrous fumarate-b12-vitamic C-folic acid  1 capsule Oral TID PC  . furosemide  40 mg Intravenous Daily  . insulin aspart  0-9 Units Subcutaneous TID WC  . levalbuterol  1.25 mg Nebulization TID  . mouth rinse  15 mL Mouth Rinse BID  . metoCLOPramide (REGLAN) injection  10 mg Intravenous Q6H  . metolazone  5 mg Oral Daily  . mometasone-formoterol  2 puff Inhalation BID  . pantoprazole  40 mg Oral Daily  . potassium chloride  20 mEq Oral BID  . sertraline  200 mg Oral Daily  . sodium chloride flush  10-40 mL Intracatheter Q12H  . sodium chloride flush  3 mL Intravenous Q12H  . spironolactone  12.5 mg Oral Daily    Infusions: . sodium chloride Stopped (12/09/16 1339)  . sodium chloride    . sodium chloride Stopped (12/09/16 2000)  . sodium chloride    . lactated ringers    . lactated ringers 20 mL/hr at 12/10/16 0500  . milrinone 0.125 mcg/kg/min (12/12/16 0700)  . potassium chloride Stopped (12/11/16 1145)    PRN Medications: sodium chloride, lactated ringers, levalbuterol, metoprolol tartrate, morphine injection, ondansetron (ZOFRAN) IV, oxyCODONE, potassium chloride, sodium chloride flush, sodium chloride flush, traMADol    Patient Profile   Latoya Adams is a 71 y/o woman with h/o HTN and tobacco abuse admitted 7/1 with NSTEMI (peak trop 7.95) and acute HF found to have critical proximal LAD lesion with EF 20-25%.   Assessment/Plan   1. NSTEMI/CAD -- Critical pLAD disease with ulcerated plaque at bifurcation with D1 as well as RPDA 90% lesion.  RHC ok. PFTs with severe COPD. Carotid u/s ok.  S/P CABG x4 . Pain controlled. On ASA and statin  2. Acute systolic HF -- Echo EF 20-25% with normal RV -- cMRI EF 24% with only small scar in apex. RHC with normal CO and no PAH -- Suspect EF will recover with CABG. --CABG x 4. -- Todays CO-OX 57%. Stop  milrinone. Add dig 0.125 mg daily.   - Continue carvedilol 3.125  mg twice a day.  -Volume status improved. CVP 5. Start 40 mg po lasix daily.  - Increase spiro 25 mg daily.   -Renal function stable.   3. PAD -- Stable  4. Tobacco use/COPD -- PFTs show severe COPD. Continue pulmonary toilet.   5. Hypokalemia- K 3.5 . Increase potassium 40 meq   6. Anemia - hgb 8.6 after 1UPRBCs.     Length of Stay: 9  Latoya Clegg, NP  12/12/2016, 7:40 AM  Advanced Heart Failure Team Pager [phone] (M-F; 7a - 4p)  Please contact [facility]  Patient seen and examined with Latoya Becket, NP. We discussed all aspects of the encounter. I agree with the assessment and plan as stated above.   Progressing well post-CABG. Co-ox marginal. Agree with stopping milrinone and starting digoxin. Increase spiro. Continue ambulation. Volume status ok.   Supp K.   Recheck co-ox in am.   Arvilla Meres, MD  9:04 AM

## 2016-12-12 NOTE — Plan of Care (Signed)
Problem: Activity: Goal: Risk for activity intolerance will decrease Outcome: Progressing Patient ambulated with avas walker this morning, approx. 270 feet. Required two breaks and application of 2L [name] when O2 sats dropped to 87%; sats recovered well after a minute of rest. Patient repositioned in chair for morning meal, O2 left on while patient eats.

## 2016-12-12 NOTE — Evaluation (Signed)
Physical Therapy Evaluation Patient Details Name: Latoya Adams MRN: [id number] DOB: September 26, 1945 Today's Date: 12/12/2016   History of Present Illness  Latoya Adams is a 71 y/o woman with h/o HTN and tobacco abuse admitted 7/1 with NSTEMI (peak trop 7.95) and acute HF found to have critical proximal LAD lesion with EF 20-25%., PMH: CHF,hypokalimia, PAD, HTN.   Clinical Impression  Pt admitted with above diagnosis. Pt currently with functional limitations due to the deficits listed below (see PT Problem List). Pt was able to ambulate on unit with good stability with Latoya Adams walker overall with 3LO2 to keep sats >90%.  Did have episode of drop in BP after walk with BP to 84/73 with pt c/o dizziness.  Assisted pt to bed and BP supine was 124/66.  Will progress pt as able.  Pt will benefit from skilled PT to increase their independence and safety with mobility to allow discharge to the venue listed below.      Follow Up Recommendations SNF;Supervision/Assistance - 24 hour    Equipment Recommendations  Other (comment) (TBA)    Recommendations for Other Services       Precautions / Restrictions Precautions Precautions: Fall;Sternal Restrictions Weight Bearing Restrictions: No      Mobility  Bed Mobility Overal bed mobility: Needs Assistance Bed Mobility: Sit to Sidelying;Rolling Rolling: Min guard       Sit to sidelying: Min guard General bed mobility comments: cues for technique.   Transfers Overall transfer level: Needs assistance   Transfers: Sit to/from Stand Sit to Stand: Min guard;Min assist         General transfer comment: used hands on knees technique with pt ablet o perform correctly with cues. Needed a little assist to come to stand when fatigued.   Ambulation/Gait Ambulation/Gait assistance: Min assist;Min guard;+2 safety/equipment Ambulation Distance (Feet): 270 Feet Assistive device:  (Latoya Adams walker) Gait Pattern/deviations: Step-through pattern;Decreased stride  length;Drifts right/left;Shuffle   Gait velocity interpretation: Below normal speed for age/gender General Gait Details: Pt shuffles at times.  Needed some assist to steer Latoya Adams walker.  Overall, progressing with Ambulation with VSS with pt on 3LO2 to keep sats >90%.  Pt did try to use 3N1 on return to room prior to lying down and c/o dizziness.  BP 127/65 initially and when taken with pt dizzy it was 84/73.  Quickly rebounded to 124/66 once pt in bed.  Nurse made aware of desat on RA and BP drop after walk.   Stairs            Wheelchair Mobility    Modified Rankin (Stroke Patients Only)       Balance Overall balance assessment: Needs assistance Sitting-balance support: No upper extremity supported;Feet supported Sitting balance-Leahy Scale: Good     Standing balance support: Bilateral upper extremity supported;During functional activity Standing balance-Leahy Scale: Fair Standing balance comment: can stand statically without UE support as she can use LES well for standing.                              Pertinent Vitals/Pain Pain Assessment: No/denies pain    Home Living Family/patient expects to be discharged to:: Private residence Living Arrangements: Alone Available Help at Discharge: Family;Available 24 hours/day (sister can come stay with pt but not long term) Type of Home: Apartment Home Access: Stairs to enter Entrance Stairs-Rails: Right;Left Entrance Stairs-Number of Steps: 3 flights Home Layout: One level (once up 3 flights to apartment) Home Equipment: None  Prior Function Level of Independence: Independent         Comments: Pt still driving     Hand Dominance        Extremity/Trunk Assessment   Upper Extremity Assessment Upper Extremity Assessment: Defer to OT evaluation    Lower Extremity Assessment Lower Extremity Assessment: Overall WFL for tasks assessed    Cervical / Trunk Assessment Cervical / Trunk Assessment:  Normal  Communication   Communication: No difficulties  Cognition Arousal/Alertness: Awake/alert Behavior During Therapy: WFL for tasks assessed/performed Overall Cognitive Status: Within Functional Limits for tasks assessed                                        General Comments      Exercises     Assessment/Plan    PT Assessment Patient needs continued PT services  PT Problem List Decreased activity tolerance;Decreased balance;Decreased mobility;Decreased knowledge of use of DME;Decreased safety awareness;Decreased knowledge of precautions;Cardiopulmonary status limiting activity       PT Treatment Interventions DME instruction;Gait training;Stair training;Functional mobility training;Therapeutic activities;Therapeutic exercise;Balance training;Patient/family education    PT Goals (Current goals can be found in the Care Plan section)  Acute Rehab PT Goals Patient Stated Goal: to go home after rehab PT Goal Formulation: With patient Time For Goal Achievement: 12/26/16 Potential to Achieve Goals: Good    Frequency Min 3X/week   Barriers to discharge Other (comment) (pt has 3 flight steps in to apartment)      Co-evaluation               AM-PAC PT "6 Clicks" Daily Activity  Outcome Measure Difficulty turning over in bed (including adjusting bedclothes, sheets and blankets)?: A Lot Difficulty moving from lying on back to sitting on the side of the bed? : A Lot Difficulty sitting down on and standing up from a chair with arms (e.g., wheelchair, bedside commode, etc,.)?: A Little Help needed moving to and from a bed to chair (including a wheelchair)?: A Little Help needed walking in hospital room?: A Little Help needed climbing 3-5 steps with a railing? : A Lot 6 Click Score: 15    End of Session Equipment Utilized During Treatment: Gait belt;Oxygen Activity Tolerance: Patient limited by fatigue Patient left: with call bell/phone within reach;in  bed;with family/visitor present Nurse Communication: Mobility status PT Visit Diagnosis: Unsteadiness on feet (R26.81);Muscle weakness (generalized) (M62.81)    Time: 1660-6301 PT Time Calculation (min) (ACUTE ONLY): 28 min   Charges:   PT Evaluation $PT Eval Moderate Complexity: 1 Procedure PT Treatments $Gait Training: 8-22 mins   PT G Codes:        Jolly Carlini,PT Acute Rehabilitation [phone] (pager)   Berline Lopes 12/12/2016, 2:47 PM

## 2016-12-12 NOTE — Plan of Care (Signed)
Problem: Skin Integrity: Goal: Risk for impaired skin integrity will decrease Outcome: Progressing Surgical incisions CDI, open to air.

## 2016-12-13 ENCOUNTER — Inpatient Hospital Stay (HOSPITAL_COMMUNITY): Payer: Medicare PPO

## 2016-12-13 LAB — CBC
HCT: 28.6 % — ABNORMAL LOW (ref 36.0–46.0)
Hemoglobin: 9.2 g/dL — ABNORMAL LOW (ref 12.0–15.0)
MCH: 28 pg (ref 26.0–34.0)
MCHC: 32.2 g/dL (ref 30.0–36.0)
MCV: 86.9 fL (ref 78.0–100.0)
Platelets: 307 10*3/uL (ref 150–400)
RBC: 3.29 MIL/uL — ABNORMAL LOW (ref 3.87–5.11)
RDW: 14.8 % (ref 11.5–15.5)
WBC: 5.9 10*3/uL (ref 4.0–10.5)

## 2016-12-13 LAB — BASIC METABOLIC PANEL
Anion gap: 10 (ref 5–15)
BUN: 10 mg/dL (ref 6–20)
CO2: 24 mmol/L (ref 22–32)
Calcium: 8.4 mg/dL — ABNORMAL LOW (ref 8.9–10.3)
Chloride: 104 mmol/L (ref 101–111)
Creatinine, Ser: 0.58 mg/dL (ref 0.44–1.00)
GFR calc Af Amer: >60 mL/min (ref >60)
GFR calc non Af Amer: >60 mL/min (ref >60)
Glucose, Bld: 96 mg/dL (ref 65–99)
Potassium: 4.1 mmol/L (ref 3.5–5.1)
Sodium: 138 mmol/L (ref 135–145)

## 2016-12-13 LAB — GLUCOSE, CAPILLARY: Glucose-Capillary: 87 mg/dL (ref 65–99)

## 2016-12-13 MED ORDER — POTASSIUM CHLORIDE CRYS ER 20 MEQ PO TBCR
40.0000 meq | EXTENDED_RELEASE_TABLET | Freq: Every day | ORAL | Status: DC
  Administered 2016-12-13: 40 meq via ORAL
  Filled 2016-12-13: qty 2

## 2016-12-13 MED ORDER — SACUBITRIL-VALSARTAN 24-26 MG PO TABS
1.0000 | ORAL_TABLET | Freq: Two times a day (BID) | ORAL | Status: DC
  Administered 2016-12-13 – 2016-12-15 (×5): 1 via ORAL
  Filled 2016-12-13 (×5): qty 1

## 2016-12-13 MED ORDER — LOSARTAN POTASSIUM 25 MG PO TABS: 12.5000 mg | ORAL_TABLET | Freq: Every day | ORAL | Status: DC

## 2016-12-13 NOTE — NC FL2 (Signed)
[place] MEDICAID FL2 LEVEL OF CARE SCREENING TOOL     IDENTIFICATION  Patient Name: Latoya Adams Birthdate: Oct 27, 1945 Sex: female Admission Date (Current Location): 12/03/2016  [place]:   [facility]. Wants SNF in the [facility] area.)   Facility and Address:  [facility], [address] [place], [place] [address]      Provider Number: [id number]  Attending Physician Name and Address:  Kerin Perna, MD  Relative Name and Phone Number:       Current Level of Care: Hospital Recommended Level of Care: Skilled Nursing Facility Prior Approval Number:    Date Approved/Denied:   PASRR Number: [id number]  Discharge Plan: SNF    Current Diagnoses: Patient Active Problem List   Diagnosis Date Noted  . S/P CABG x 4 12/08/2016  . Acute systolic heart failure (HCC) 12/06/2016  . Centrilobular emphysema (HCC) 12/06/2016  . CAD in native artery 12/06/2016  . NSTEMI (non-ST elevated myocardial infarction) (HCC) 12/03/2016  . Hypertension, essential 12/03/2016    Orientation RESPIRATION BLADDER Height & Weight     Self, Situation, Time, Place  Normal Continent Weight: 131 lb 6.4 oz (59.6 kg) Height:  5\' 8"  (172.7 cm)  BEHAVIORAL SYMPTOMS/MOOD NEUROLOGICAL BOWEL NUTRITION STATUS   (None)  (None) Continent Diet (Carb modified)  AMBULATORY STATUS COMMUNICATION OF NEEDS Skin   Limited Assist Verbally Surgical wounds, Other (Comment) (Rash)                       Personal Care Assistance Level of Assistance  Bathing, Feeding, Dressing Bathing Assistance: Limited assistance Feeding assistance: Independent Dressing Assistance: Limited assistance     Functional Limitations Info  Sight, Hearing, Speech Sight Info: Adequate Hearing Info: Adequate Speech Info: Adequate    SPECIAL CARE FACTORS FREQUENCY  Blood pressure, PT (By licensed PT), OT (By licensed OT)     PT Frequency: 5 x week OT Frequency: 5 x week             Contractures Contractures Info: Not present    Additional Factors Info  Code Status, Allergies Code Status Info: Full Allergies Info: Codeine           Current Medications (12/13/2016):  This is the current hospital active medication list Current Facility-Administered Medications  Medication Dose Route Frequency Provider Last Rate Last Dose  . 0.9 %  sodium chloride infusion   Intravenous Continuous Donata Clay, Theron Arista, MD      . 0.9 %  sodium chloride infusion  250 mL Intravenous PRN Donata Clay, Theron Arista, MD      . acetaminophen (TYLENOL) tablet 1,000 mg  1,000 mg Oral Q6H Gold, Wayne E, PA-C   1,000 mg at 12/13/16 5621   Or  . acetaminophen (TYLENOL) solution 1,000 mg  1,000 mg Per Tube Q6H Gold, Wayne E, PA-C      . aspirin EC tablet 325 mg  325 mg Oral Daily Gold, Wayne E, PA-C   325 mg at 12/13/16 3086   Or  . aspirin chewable tablet 324 mg  324 mg Per Tube Daily Gold, Wayne E, PA-C      . atorvastatin (LIPITOR) tablet 80 mg  80 mg Oral q1800 Gold, Wayne E, PA-C   80 mg at 12/12/16 1705  . bisacodyl (DULCOLAX) EC tablet 10 mg  10 mg Oral Daily Gold, Wayne E, PA-C   10 mg at 12/10/16 0941   Or  . bisacodyl (DULCOLAX) suppository 10 mg  10 mg Rectal Daily Gold, Wayne E, PA-C      . buPROPion (WELLBUTRIN XL) 24 hr tablet 300 mg  300 mg Oral Daily Gold, Wayne E, PA-C   300 mg at 12/13/16 0834  . carvedilol (COREG) tablet 3.125 mg  3.125 mg Oral BID WC Bensimhon, Bevelyn Buckles, MD   3.125 mg at 12/13/16 0834  . digoxin (LANOXIN) tablet 0.125 mg  0.125 mg Oral Daily Clegg, Amy D, NP   0.125 mg at 12/13/16 0836  . docusate sodium (COLACE) capsule 200 mg  200 mg Oral Daily Gold, Wayne E, PA-C   200 mg at 12/10/16 0942  . enoxaparin (LOVENOX) injection 30 mg  30 mg Subcutaneous QHS Donata Clay, Theron Arista, MD   30 mg at 12/12/16 2144  . ferrous fumarate-b12-vitamic C-folic acid (TRINSICON / FOLTRIN) capsule 1 capsule  1 capsule Oral TID PC Donata Clay, Theron Arista, MD   1 capsule at 12/13/16 0845  . furosemide  (LASIX) tablet 40 mg  40 mg Oral Daily Clegg, Amy D, NP   40 mg at 12/13/16 0836  . levalbuterol (XOPENEX) nebulizer solution 1.25 mg  1.25 mg Nebulization Q6H PRN Loreli Slot, MD      . magnesium hydroxide (MILK OF MAGNESIA) suspension 30 mL  30 mL Oral Daily PRN Donata Clay, Theron Arista, MD      . mometasone-formoterol [facility]) 200-5 MCG/ACT inhaler 2 puff  2 puff Inhalation BID Kerin Perna, MD   2 puff at 12/13/16 0902  . ondansetron (ZOFRAN) injection 4 mg  4 mg Intravenous Q6H PRN Gold, Wayne E, PA-C      . oxyCODONE (Oxy IR/ROXICODONE) immediate release tablet 5 mg  5 mg Oral Q3H PRN Donata Clay, Theron Arista, MD      . pantoprazole (PROTONIX) EC tablet 40 mg  40 mg Oral Daily Gold, Wayne E, PA-C   40 mg at 12/13/16 0836  . potassium chloride 10 mEq in 50 mL *CENTRAL LINE* IVPB  10 mEq Intravenous Q1H PRN Kerin Perna, MD   Stopped at 12/11/16 1145  . potassium chloride SA (K-DUR,KLOR-CON) CR tablet 40 mEq  40 mEq Oral Daily Barrett, Erin R, PA-C   40 mEq at 12/13/16 0836  . sacubitril-valsartan (ENTRESTO) 24-26 mg per tablet  1 tablet Oral BID Clegg, Amy D, NP      . sertraline (ZOLOFT) tablet 200 mg  200 mg Oral Daily Gold, Wayne E, PA-C   200 mg at 12/13/16 0835  . sodium chloride flush (NS) 0.9 % injection 3 mL  3 mL Intravenous Q12H Gold, Wayne E, PA-C   3 mL at 12/13/16 0839  . sodium chloride flush (NS) 0.9 % injection 3 mL  3 mL Intravenous PRN Gold, Wayne E, PA-C      . sodium chloride flush (NS) 0.9 % injection 3 mL  3 mL Intravenous Q12H Donata Clay, Theron Arista, MD   3 mL at 12/13/16 1610  . sodium chloride flush (NS) 0.9 % injection 3 mL  3 mL Intravenous PRN Donata Clay, Theron Arista, MD      . spironolactone (ALDACTONE) tablet 25 mg  25 mg Oral Daily Clegg, Amy D, NP   25 mg at 12/13/16 0834  . traMADol (ULTRAM) tablet 50-100 mg  50-100 mg Oral Q4H PRN Gershon Crane E, PA-C   100 mg at 12/10/16 0707     Discharge Medications: Please see discharge summary for a list of discharge  medications.  Relevant Imaging Results:  Relevant Lab Results:   Additional  Information SS#: [id number]  Margarito Liner, LCSW

## 2016-12-13 NOTE — Assessment & Plan Note (Signed)
Left internal mammary artery to LAD, saphenous vein graft to diagonal, saphenous vein graft to     obtuse marginal, saphenous vein graft to posterior descending.

## 2016-12-13 NOTE — Discharge Summary (Signed)
Physician Discharge Summary       [address]       [place] [address]             [phone]    Patient ID: Latoya Adams MRN: [id number] DOB/AGE: Dec 19, 1945 71 y.o.  Admit date: 12/03/2016 Discharge date: 12/15/2016  Admission Diagnoses: 1. S/p NSTEMI (non-ST elevated myocardial infarction) (HCC) 2. Coronary artery disease 3. Ischemic cardiomyopathy 4. Acute systolic heart failure (HCC)  Active Diagnoses:  1. Hypertension, essential 2. COPD/Centrilobular emphysema (HCC) 3. Alcohol abuse 4. Tobacco abuse 5. ABL anemia      Consults: Heart failure  Procedure (s):  Left Heart Cath and Coronary Angiography by Dr. Excell Seltzer on 12/03/2016:  Conclusion   1. Severe multivessel coronary artery disease with critical stenosis of the proximal LAD at the first diagonal bifurcation, severe diffuse mid LAD stenosis, severe diagonal stenosis, and severe RCA/PDA stenosis 2. Severe segmental LV dysfunction with LVEF estimated at 20% and markedly elevated LVEDP 3. Aortoiliac disease with heavy diffuse atherosclerotic changes of the distal abdominal aorta, severe left external iliac stenosis, and severe right iliac stenosis  The patient has severe multivessel coronary disease, likely with proximal LAD plaque rupture as her acute culprit. She also has severe LV dysfunction and markedly elevated LVEDP consistent with acute systolic heart failure. Her aortoiliac atherosclerotic changes place her at risk of vascular complications from peripherally inserted hemodynamic support devices. Her blood pressure is currently stable and I think it is best to treat her medically for heart failure with IV diuresis and IV heparin for ACS. Recommend cardiac surgical consultation once she is clinically improved.    1. Coronary artery bypass grafting x4 (left internal mammary artery to     LAD, saphenous vein graft to diagonal, saphenous vein graft to     obtuse marginal, saphenous vein graft to  posterior descending). 2. Endoscopic harvest of bilateral greater saphenous vein.  History of Presenting Illness: This is a  71 year old smoker without prior cardiac history but with a strong family cardiac history [both parents and a sister have had CABG] presented to this hospital with 2-3 days of chest pain and increasing shortness of breath with positive cardiac enzymes. Troponin peaked at 8.5. Urgent cardiac catheterization demonstrated 95% LAD-diagonal stenosis, high-grade stenosis of the posterior descending and significant circumflex disease as well. LVEDP was elevated at 35. Ejection fraction was 20-25%. There is mild MR. She had peripheral edema on presentation. Her symptoms and fluid overload significant improvement with Lasix diuretics. She has had no recurrent symptoms of chest pain or shortness of breath.  The patient is a heavy smoker. She has changes of COPD and her CT scan but no at risk poor nodules or abnormal mediastinal adenopathy. PFTs are pending.  The patient has peripheral rash or disease with reduced pedal pulses and iliac disease on a CTA performed to rule out pulmonary was. She had a ultrasound of her legs which rule out DVT but showed only edema.  Cardiac MRI viability study shows ejection fraction 20-25 percent with only a small area of subendocardial scar at the LV apex.  Dr. Donata Clay discussed the need for coronary artery bypass grafting surgery. Potential risks, benefits, and complications were discussed with the patient and she agreed to proceed with surgery. Pre operative carotid duplex US showed no significant internal carotid artery stenosis bilaterally. She underwent a CABG x 4 on 12/08/2016.   Brief Hospital Course:  The patient was extubated the evening of surgery without difficulty.  She remained afebrile and hemodynamically stable. She was weaned off Dopamine and Milrinone drips. Theone Murdoch, a line, chest tubes, and foley were removed early in the post  operative course. Coreg was started and titrated accordingly.She was volume over loaded and diuresed. She had ABL anemia. She did  require a post op transfusion. Digoxin and Spironolactone were later started as well.  Heart failure followed her post op. Last H and H was 8.6 and 26.5. She was weaned off the insulin drip. [name] HGA1C pre op was 5.4. The patient was felt surgically stable for transfer from the ICU to PCTU for further convalescence on 12/12/2016. She continues to progress with cardiac rehab. She was ambulating on room air. She has been tolerating a diet and has had a bowel movement. Epicardial pacing wires were removed on 12/13/2016. Patient was deconditioned so PT and OT were consulted as it was felt she would benefit from a SNF. Chest tube sutures will be removed the day of discharge. The patient is felt surgically stable for discharge today.  We ask the SNF to please do the following: 1. Please obtain vital signs at least one time daily 2.Please weigh the patient daily. If he or she continues to gain weight or develops lower extremity edema, contact the office at [phone]. 3. Ambulate patient at least three times daily and please use sternal precautions.   Latest Vital Signs: Blood pressure 104/66, pulse (!) 117, temperature 97.8 F (36.6 C), temperature source Oral, resp. rate 20, height 5\' 8"  (1.727 m), weight 134 lb 4.2 oz (60.9 kg), SpO2 100 %.  Physical Exam: General appearance: alert, cooperative and no distress Heart: regular rate and rhythm and tachy Lungs: clear to auscultation bilaterally Abdomen: soft, non-tender; bowel sounds normal; no masses,  no organomegaly Extremities: edema mild pitting Wound: clean and dry  Discharge Condition:Stable and discharge to home.  Recent laboratory studies:  Lab Results  Component Value Date   WBC 5.9 12/13/2016   HGB 9.2 (L) 12/13/2016   HCT 28.6 (L) 12/13/2016   MCV 86.9 12/13/2016   PLT 307 12/13/2016   Lab  Results  Component Value Date   NA 133 (L) 12/15/2016   K 3.7 12/15/2016   CL 101 12/15/2016   CO2 24 12/15/2016   CREATININE 0.66 12/15/2016   GLUCOSE 99 12/15/2016    Diagnostic Studies: Dg Chest 2 View  Result Date: 12/13/2016 CLINICAL DATA:  Coronary bypass 6 days ago, shortness of breath EXAM: CHEST  2 VIEW COMPARISON:  12/12/2016 FINDINGS: Right IJ central line has been removed. Small pleural effusions persist with improving bibasilar atelectasis. Stable heart size and vascularity. Coronary bypass changes noted. Upper lobes remain clear. Trachea is midline. IMPRESSION: Stable small pleural effusions with improving bibasilar atelectasis. Electronically Signed   By: Judie Petit.  Shick M.D.   On: 12/13/2016 08:12   US Venous Img Lower Unilateral Right  Result Date: 12/03/2016 CLINICAL DATA:  Right lower extremity swelling and pain EXAM: RIGHT LOWER EXTREMITY VENOUS DOPPLER ULTRASOUND TECHNIQUE: Gray-scale sonography with graded compression, as well as color Doppler and duplex ultrasound were performed to evaluate the lower extremity deep venous systems from the level of the common femoral vein and including the common femoral, femoral, profunda femoral, popliteal and calf veins including the posterior tibial, peroneal and gastrocnemius veins when visible. The superficial great saphenous vein was also interrogated. Spectral Doppler was utilized to evaluate flow at rest and with distal augmentation maneuvers in the common femoral, femoral and popliteal veins. COMPARISON:  None. FINDINGS: Contralateral Common Femoral Vein: Respiratory phasicity is normal and symmetric with the symptomatic side. No evidence of thrombus. Normal compressibility. Common Femoral Vein: No evidence of thrombus. Normal compressibility, respiratory phasicity and response to augmentation. Saphenofemoral Junction: No evidence of thrombus. Normal compressibility and flow on color Doppler imaging. Profunda Femoral Vein: No evidence of  thrombus. Normal compressibility and flow on color Doppler imaging. Femoral Vein: No evidence of thrombus. Normal compressibility, respiratory phasicity and response to augmentation. Popliteal Vein: No evidence of thrombus. Normal compressibility, respiratory phasicity and response to augmentation. Calf Veins: No evidence of thrombus. Normal compressibility and flow on color Doppler imaging. Superficial Great Saphenous Vein: No evidence of thrombus. Normal compressibility and flow on color Doppler imaging. Venous Reflux:  None. Other Findings:  None. IMPRESSION: No evidence of DVT within the right lower extremity. Edema in the calf. Electronically Signed   By: Gerome Sam III M.D   On: 12/03/2016 18:52   Mr Cardiac Morphology W Wo Contrast  Result Date: 12/04/2016 CLINICAL DATA:  Ischemic Cardiomyopathy EXAM: CARDIAC MRI TECHNIQUE: The patient was scanned on a 1.5 Tesla GE magnet. A dedicated cardiac coil was used. Functional imaging was done using Fiesta sequences. 2,3, and 4 chamber views were done to assess for RWMA's. Modified Simpson's rule using a short axis stack was used to calculate an ejection fraction on a dedicated work [employer] officer, [employer]. The patient received 23 cc of Multihance. After 10 minutes inversion recovery sequences were used to assess for infiltration and scar tissue. CONTRAST:  23 cc Multihance FINDINGS: There was mild LAE. The RA and RV were normal. There was a trivial posterior pericardial effusion There was no ASD/PFO or VSD. There was mild appearing AR and MR. The aortic root was normal The LV was severely dilated with diffuse hypokinesis worse in the septum and anterior wall with a small area of apical akinesis. There was no mural apical thrombus. The quantitative EF was 24% (EDV 191 cc ESV 144 cc SV 47 cc) Delayed enhancement images with gadolinium showed only a small area of subendocardial scar involving the apex IMPRESSION: 1) Severe LVE with diffuse hypokinesis  worse in the septum and anterior wall with a small area of apical akinesis EF 24% 2) Small area of subendocardial scar involving the apex 3) No mural apical thrombus 4) Mild LAE 5) Mild appearing MR 6) Mild appearing AR 7) Trivial posterior pericardial effusion Charlton Haws Electronically Signed   By: Charlton Haws M.D.   On: 12/04/2016 18:44  Discharge Instructions    Amb Referral to Cardiac Rehabilitation    Complete by:  As directed    Diagnosis:   CABG Heart Failure (see criteria below if ordering Phase II) NSTEMI     Heart Failure Type:  Chronic Systolic & Diastolic   CABG X ___:  4      Discharge Medications: Allergies as of 12/15/2016      Reactions   Codeine Hives      Medication List    STOP taking these medications   amLODipine 10 MG tablet Commonly known as:  NORVASC     TAKE these medications   aspirin 325 MG EC tablet Take 1 tablet (325 mg total) by mouth daily.   atorvastatin 80 MG tablet Commonly known as:  LIPITOR Take 1 tablet (80 mg total) by mouth daily at 6 PM.   buPROPion 300 MG 24 hr tablet Commonly known as:  WELLBUTRIN XL Take 300 mg by mouth daily.  carvedilol 3.125 MG tablet Commonly known as:  COREG Take 1 tablet (3.125 mg total) by mouth 2 (two) times daily with a meal.   digoxin 0.125 MG tablet Commonly known as:  LANOXIN Take 1 tablet (0.125 mg total) by mouth daily.   furosemide 40 MG tablet Commonly known as:  LASIX Take 1 tablet (40 mg total) by mouth daily.   mometasone-formoterol 200-5 MCG/ACT Aero Commonly known as:  DULERA Inhale 2 puffs into the lungs 2 (two) times daily.   potassium chloride SA 20 MEQ tablet Commonly known as:  K-DUR,KLOR-CON Take 3 tablets (60 mEq total) by mouth daily.   sacubitril-valsartan 24-26 MG Commonly known as:  ENTRESTO Take 1 tablet by mouth 2 (two) times daily.   sertraline 100 MG tablet Commonly known as:  ZOLOFT Take 200 mg by mouth daily.   spironolactone 25 MG tablet Commonly  known as:  ALDACTONE Take 1 tablet (25 mg total) by mouth daily.   traMADol 50 MG tablet Commonly known as:  ULTRAM Take 1-2 tablets (50-100 mg total) by mouth every 4 (four) hours as needed for moderate pain.      The patient has been discharged on:   1.Beta Blocker:  Yes [ x  ]                              No   [   ]                              If No, reason:  2.Ace Inhibitor/ARB: Yes [   ]                                     No  [    ]                                     If No, reason:  3.Statin:   Yes [ x  ]                  No  [   ]                  If No, reason:  4.Ecasa:  Yes  [  x ]                  No   [   ]                  If No, reason:  Follow Up Appointments: Follow-up Information    Tonny Bollman, MD.   Specialty:  Cardiology Why:  PLEASE CALL TO SCHEDULE Contact information: [address] [place] [address] [phone]        Donata Clay, Theron Arista, MD Follow up on 01/10/2017.   Specialty:  Cardiothoracic Surgery Why:  PA/LAT CXR to be taken (at [facility] which is in the same buidling as Dr. Zenaida Niece Trigt's office) on 01/10/2017 at 12:30 pm;Appointment time is at 1:00 pm Contact information: [address] [place] [address] [phone]        Bensimhon, Bevelyn Buckles, MD Follow up on 01/08/2017.   Specialty:  Cardiology Why:  1:30 Garage Code  [address]  Contact information: [address] [place] [address] [phone]           Signed: Marrion Coy 12/15/2016, 11:27 AM

## 2016-12-13 NOTE — Clinical Social Work Note (Signed)
Clinical Social Work Assessment  Patient Details  Name: Latoya Adams MRN: [id number] Date of Birth: 07/10/1945  Date of referral:  12/13/16               Reason for consult:  Facility Placement, Discharge Planning                Permission sought to share information with:  Facility Sport and exercise psychologist, Family Supports Permission granted to share information::  Yes, Verbal Permission Granted  Name::     Ulysees Barns  Agency::  SNF's  Relationship::  Son  Contact Information:  [phone]  Housing/Transportation Living arrangements for the past 2 months:  Apartment Source of Information:  Patient, Medical Team Patient Interpreter Needed:  None Criminal Activity/Legal Involvement Pertinent to Current Situation/Hospitalization:  No - Comment as needed Significant Relationships:  Adult Children, Siblings Lives with:  Self Do you feel safe going back to the place where you live?  Yes Need for family participation in patient care:  Yes (Comment)  Care giving concerns:  PT recommending SNF once medically stable for discharge.   Social Worker assessment / plan:  CSW met with patient. No supports at bedside. CSW introduced role and explained that PT recommendations would be discussed. Patient agreeable to SNF placement and wants to stay closer to this area rather than home in [facility]. [name] daughter lives in [place] and her son lives in [place]. No preference on facility. SNF list provided. She will review with her children and call CSW today with a decision. No further concerns. CSW encouraged patient to contact CSW as needed. CSW will continue to follow patient for support and facilitate discharge to SNF once medically stable.  Employment status:  Retired Nurse, adult PT Recommendations:  [place] / Referral to community resources:  [place]  Patient/Family's Response to care:  Patient agreeable  to SNF placement. Patient's children and sisters supportive and involved in patient's care. Patient appreciated social work intervention.  Patient/Family's Understanding of and Emotional Response to Diagnosis, Current Treatment, and Prognosis:  Patient has a good understanding of the reason for admission and her need for rehab prior to returning home. Patient notes that it is time to look forward rather than continue looking back on the day she came into the hospital. Patient appears happy with hospital care.  Emotional Assessment Appearance:  Appears stated age Attitude/Demeanor/Rapport:  Other (Pleasant) Affect (typically observed):  Accepting, Appropriate, Calm, Pleasant Orientation:  Oriented to Self, Oriented to Place, Oriented to  Time, Oriented to Situation Alcohol / Substance use:  Never Used Psych involvement (Current and /or in the community):  No (Comment)  Discharge Needs  Concerns to be addressed:  Care Coordination Readmission within the last 30 days:  No Current discharge risk:  Dependent with Mobility, Lives alone Barriers to Discharge:  Ship broker, Continued Medical Work up   Candie Chroman, LCSW 12/13/2016, 12:50 PM

## 2016-12-13 NOTE — Progress Notes (Signed)
Pacing wires removed per order. Patient instructed on bed rest and q15 VS x 1hr. 

## 2016-12-13 NOTE — Progress Notes (Signed)
Advanced Heart Failure Rounding Note  Primary Cardiologist: Excell Seltzer  Subjective:    Underwent CABG x 4 on 7/6.   Milrinone stopped 7/10. Latoya Adams was increased.   Feeling stronger. Complaining of mild dyspnea with exertion but also says part of it is because anxiety. Weight up about 3 pounds from pre-op  cMRI EF 24% with only small scar in apex  Objective:   Weight Range: 59.6 kg (131 lb 6.4 oz) Body mass index is 19.98 kg/m.   Vital Signs:   Temp:  [97.5 F (36.4 C)-98.4 F (36.9 C)] 98.4 F (36.9 C) (07/11 0203) Pulse Rate:  [88-113] 88 (07/11 1045) Resp:  [13-35] 17 (07/11 1045) BP: (106-129)/(59-93) 121/70 (07/11 1045) SpO2:  [92 %-100 %] 96 % (07/11 1045) Weight:  [59.6 kg (131 lb 6.4 oz)] 59.6 kg (131 lb 6.4 oz) (07/11 0216) Last BM Date: 12/12/16  Weight change: Filed Weights   12/11/16 0400 12/12/16 0500 12/13/16 0216  Weight: 63.3 kg (139 lb 8.8 oz) 60.7 kg (133 lb 14.4 oz) 59.6 kg (131 lb 6.4 oz)    Intake/Output:   Intake/Output Summary (Last 24 hours) at 12/13/16 1107 Last data filed at 12/13/16 0845  Gross per 24 hour  Intake              380 ml  Output              650 ml  Net             -270 ml      Physical Exam  General:  Walking with cardiac rehab. No respiratory issues.  HEENT: normal Neck: supple. JVP 9-10. Carotids 2+ bilat; no bruits. No lymphadenopathy or thryomegaly appreciated. Cor: PMI nondisplaced. Regular rate & rhythm. No rubs, gallops or murmurs. Sternal Incision approximated.  Lungs: clear Abdomen: soft, nontender, nondistended. No hepatosplenomegaly. No bruits or masses. Good bowel sounds. Extremities: no cyanosis, clubbing, rash, R and LLE 1+edema Neuro: alert & orientedx3, cranial nerves grossly intact. moves all 4 extremities w/o difficulty. Affect pleasant     Telemetry   Sinus Rhythm/Sinus Tach 90-100s.    EKG    EKG from 12/05/16 with sinus tach, Personally reviewed.   Labs    CBC  Recent Labs   12/12/16 0500 12/13/16 0410  WBC 8.8 5.9  HGB 8.6* 9.2*  HCT 26.5* 28.6*  MCV 87.2 86.9  PLT 279 307   Basic Metabolic Panel  Recent Labs  12/12/16 0500 12/13/16 0410  NA 136 138  K 3.5 4.1  CL 102 104  CO2 23 24  GLUCOSE 105* 96  BUN 11 10  CREATININE 0.59 0.58  CALCIUM 8.3* 8.4*   Liver Function Tests No results for input(s): AST, ALT, ALKPHOS, BILITOT, PROT, ALBUMIN in the last 72 hours. No results for input(s): LIPASE, AMYLASE in the last 72 hours. Cardiac Enzymes No results for input(s): CKTOTAL, CKMB, CKMBINDEX, TROPONINI in the last 72 hours.  BNP: BNP (last 3 results)  Recent Labs  12/03/16 1748 12/03/16 2212  BNP 2,721.4* 2,339.4*    ProBNP (last 3 results) No results for input(s): PROBNP in the last 8760 hours.   D-Dimer No results for input(s): DDIMER in the last 72 hours. Hemoglobin A1C No results for input(s): HGBA1C in the last 72 hours. Fasting Lipid Panel No results for input(s): CHOL, HDL, LDLCALC, TRIG, CHOLHDL, LDLDIRECT in the last 72 hours. Thyroid Function Tests No results for input(s): TSH, T4TOTAL, T3FREE, THYROIDAB in the last 72 hours.  Invalid input(s):  FREET3  Other results:   Imaging    Dg Chest 2 View  Result Date: 12/13/2016 CLINICAL DATA:  Coronary bypass 6 days ago, shortness of breath EXAM: CHEST  2 VIEW COMPARISON:  12/12/2016 FINDINGS: Right IJ central line has been removed. Small pleural effusions persist with improving bibasilar atelectasis. Stable heart size and vascularity. Coronary bypass changes noted. Upper lobes remain clear. Trachea is midline. IMPRESSION: Stable small pleural effusions with improving bibasilar atelectasis. Electronically Signed   By: Judie Petit.  Shick M.D.   On: 12/13/2016 08:12     Medications:     Scheduled Medications: . acetaminophen  1,000 mg Oral Q6H   Or  . acetaminophen (TYLENOL) oral liquid 160 mg/5 mL  1,000 mg Per Tube Q6H  . aspirin EC  325 mg Oral Daily   Or  . aspirin  324  mg Per Tube Daily  . atorvastatin  80 mg Oral q1800  . bisacodyl  10 mg Oral Daily   Or  . bisacodyl  10 mg Rectal Daily  . buPROPion  300 mg Oral Daily  . carvedilol  3.125 mg Oral BID WC  . digoxin  0.125 mg Oral Daily  . docusate sodium  200 mg Oral Daily  . enoxaparin (LOVENOX) injection  30 mg Subcutaneous QHS  . ferrous fumarate-b12-vitamic C-folic acid  1 capsule Oral TID PC  . furosemide  40 mg Oral Daily  . mometasone-formoterol  2 puff Inhalation BID  . pantoprazole  40 mg Oral Daily  . potassium chloride  40 mEq Oral Daily  . sertraline  200 mg Oral Daily  . sodium chloride flush  3 mL Intravenous Q12H  . sodium chloride flush  3 mL Intravenous Q12H  . spironolactone  25 mg Oral Daily    Infusions: . sodium chloride    . sodium chloride    . potassium chloride Stopped (12/11/16 1145)    PRN Medications: sodium chloride, levalbuterol, magnesium hydroxide, ondansetron (ZOFRAN) IV, oxyCODONE, potassium chloride, sodium chloride flush, sodium chloride flush, traMADol    Patient Profile   Latoya Adams is a 71 y/o woman with h/o HTN and tobacco abuse admitted 7/1 with NSTEMI (peak trop 7.95) and acute HF found to have critical proximal LAD lesion with EF 20-25%.   Assessment/Plan   1. NSTEMI/CAD -- Critical pLAD disease with ulcerated plaque at bifurcation with D1 as well as RPDA 90% lesion.  RHC ok. PFTs with severe COPD. Carotid u/s ok.  -- S/P CABG x4 . On ASA and statin. Pain controlled.   2. Acute systolic HF -- Echo EF 20-25% with normal RV -- cMRI EF 24% with only small scar in apex. RHC with normal CO and no PAH -- Suspect EF will recover with CABG. --CABG x 4.  -- Continue dig 0.125 mg daily. - Continue carvedilol 3.125 mg twice a day.  - Volume status mildly elevated. Continue lasix 40 mg daily. Start entresto 24-26 mg twice a day.    - Continue spiro 25 mg daily.  - Renal function stable.  BMEt in am.   3. PAD -- Stable  4. Tobacco use/COPD --  PFTs show severe COPD. Continue pulmonary toilet.   5. Hypokalemia- K 4.1 Continue potassium 40 meq daily.    6. Anemia -Hgb stable 9.2 .    Continue cardiac rehab.    Length of Stay: 10  Tonye Becket, NP  12/13/2016, 11:07 AM  Advanced Heart Failure Team Pager [phone] (M-F; 7a - 4p)  Please contact [facility]  Patient seen and examined with Tonye Becket, NP. We discussed all aspects of the encounter. I agree with the assessment and plan as stated above.   Stable from CAD perspective. Volume status mildly elevated. Continue lasix and spiro. Low-dose Entresto added. Need to watch BP very closely. Continue ambulation. Maintaining NSR on tele.   Arvilla Meres, MD  10:14 PM

## 2016-12-13 NOTE — Clinical Social Work Placement (Signed)
   CLINICAL SOCIAL WORK PLACEMENT  NOTE  Date:  12/13/2016  Patient Details  Name: Latoya Adams MRN: [id number] Date of Birth: 1945-06-29  Clinical Social Work is seeking post-discharge placement for this patient at the Skilled  Nursing Facility level of care (*CSW will initial, date and re-position this form in  chart as items are completed):  Yes   Patient/family provided with [facility] Clinical Social Work Department's list of facilities offering this level of care within the geographic area requested by the patient (or if unable, by the patient's family).  Yes   Patient/family informed of their freedom to choose among providers that offer the needed level of care, that participate in Medicare, Medicaid or managed care program needed by the patient, have an available bed and are willing to accept the patient.  Yes   Patient/family informed of [facility]'s ownership interest in [facility] and [facility], as well as of the fact that they are under no obligation to receive care at these facilities.  PASRR submitted to EDS on 12/13/16     PASRR number received on 12/13/16     Existing PASRR number confirmed on       FL2 transmitted to all facilities in geographic area requested by pt/family on 12/13/16     FL2 transmitted to all facilities within larger geographic area on       Patient informed that his/her managed care company has contracts with or will negotiate with certain facilities, including the following:            Patient/family informed of bed offers received.  Patient chooses bed at       Physician recommends and patient chooses bed at      Patient to be transferred to   on  .  Patient to be transferred to facility by       Patient family notified on   of transfer.  Name of family member notified:        PHYSICIAN Please sign FL2     Additional Comment:    _______________________________________________ Margarito Liner, LCSW 12/13/2016, 12:53  PM

## 2016-12-13 NOTE — Progress Notes (Signed)
Physical Therapy Treatment Patient Details Name: Latoya Adams MRN: [id number] DOB: 1946/04/07 Today's Date: 12/13/2016    History of Present Illness Latoya Adams is a 71 y/o woman with h/o HTN and tobacco abuse admitted 7/1 with NSTEMI (peak trop 7.95) and acute HF found to have critical proximal LAD lesion with EF 20-25%., PMH: CHF,hypokalimia, PAD, HTN.     PT Comments    Pt is making good progress towards her goals. Pt is currently min guard for transfers and ambulation of 250 feet with RW. Pt requires vc for deeper, fuller breaths when she is ambulating as she admits to some anxiety with walking. Pt requires skilled PT to progress gait training and to improve LE strength and endurance to safely mobilize in her discharge environment.    Follow Up Recommendations  SNF;Supervision/Assistance - 24 hour     Equipment Recommendations  Other (comment) (TBA)    Recommendations for Other Services       Precautions / Restrictions Precautions Precautions: Fall;Sternal Restrictions Weight Bearing Restrictions: No    Mobility  Bed Mobility               General bed mobility comments: in recliner at entry  Transfers Overall transfer level: Needs assistance   Transfers: Sit to/from Stand Sit to Stand: Min guard         General transfer comment: used hands on knees technique    Ambulation/Gait Ambulation/Gait assistance: Min guard Ambulation Distance (Feet): 250 Feet Assistive device:  (Eva walker) Gait Pattern/deviations: Step-through pattern;Decreased stride length Gait velocity: slowed Gait velocity interpretation: Below normal speed for age/gender General Gait Details: slow, steady gait, vc to use focal point in distance for straight gait and to decrease unbalanced feeling as well as to take deeper, slower breaths to aid in her anxiety        Balance Overall balance assessment: Needs assistance Sitting-balance support: No upper extremity supported;Feet  supported Sitting balance-Leahy Scale: Good     Standing balance support: Bilateral upper extremity supported;During functional activity Standing balance-Leahy Scale: Fair Standing balance comment: can stand statically without UE support to wash hands                            Cognition Arousal/Alertness: Awake/alert Behavior During Therapy: WFL for tasks assessed/performed Overall Cognitive Status: Within Functional Limits for tasks assessed                                           General Comments General comments (skin integrity, edema, etc.): at rest BP 111/69, HR 106, SaO2 on RA 95% O2, RR 18, with ambulation RR rose to 30 breaths/min as pt became anxious with gait with verbal cuing for deeper fuller breaths RR resumed 18 breaths/min, after ambulation BP 108/63, HR 122 bpm, SaO2 98%O2 on RA, and RR 22 breaths/min       Pertinent Vitals/Pain Pain Assessment: Faces Faces Pain Scale: Hurts little more Pain Location: incisional site with movement Pain Intervention(s): Monitored during session           PT Goals (current goals can now be found in the care plan section) Acute Rehab PT Goals Patient Stated Goal: to go home after rehab PT Goal Formulation: With patient Time For Goal Achievement: 12/26/16 Potential to Achieve Goals: Good Progress towards PT goals: Progressing toward goals    Frequency  Min 3X/week      PT Plan Current plan remains appropriate       AM-PAC PT "6 Clicks" Daily Activity  Outcome Measure  Difficulty turning over in bed (including adjusting bedclothes, sheets and blankets)?: A Lot Difficulty moving from lying on back to sitting on the side of the bed? : A Lot Difficulty sitting down on and standing up from a chair with arms (e.g., wheelchair, bedside commode, etc,.)?: A Little Help needed moving to and from a bed to chair (including a wheelchair)?: A Little Help needed walking in hospital room?: A  Little Help needed climbing 3-5 steps with a railing? : A Lot 6 Click Score: 15    End of Session Equipment Utilized During Treatment: Gait belt Activity Tolerance: Patient limited by fatigue Patient left: with call bell/phone within reach;in bed;with family/visitor present Nurse Communication: Mobility status PT Visit Diagnosis: Unsteadiness on feet (R26.81);Muscle weakness (generalized) (M62.81)     Time: 1425-1450 PT Time Calculation (min) (ACUTE ONLY): 25 min  Charges:  $Gait Training: 23-37 mins                    G Codes:       Latoya Adams B. Beverely Risen PT, DPT Acute Rehabilitation  [phone] Pager [phone]     Latoya Adams 12/13/2016, 5:00 PM

## 2016-12-13 NOTE — Progress Notes (Signed)
CARDIAC REHAB PHASE I   PRE:  Rate/Rhythm: 94 SR  BP:  Supine: 121/70  Sitting:   Standing:    SaO2: 99%RA  MODE:  Ambulation: 340 ft   POST:  Rate/Rhythm: 116 ST  BP:  Supine:   Sitting: 118/72  Standing:    SaO2: 94-96%RA hall and room 1055-1140 Pt walked 340 ft on RA with gait belt use, rolling walker and asst x 1. One asst to follow with recliner. Pt had to sit down 4 times to rest due to SOB and she stated also some anxiety. Tried to get pt to purse lip breathe. Sats good on RA in hallway. Left in recliner and set up lunch. Call bell in reach. Denied dizziness.   Luetta Nutting, RN BSN  12/13/2016 11:37 AM

## 2016-12-13 NOTE — Progress Notes (Signed)
      [address]       [place] [address]             [phone]      5 Days Post-Op Procedure(s) (LRB): CORONARY ARTERY BYPASS GRAFTING (CABG)x4 using left internal mammary artery and bilateral greater saphenous veins harvested endoscopically (N/A) TRANSESOPHAGEAL ECHOCARDIOGRAM (TEE) (N/A)   Subjective:  Latoya Adams states she is doing pretty well.  She did experience some minor right sided chest discomfort this morning.  Also states since surgery she can feel her heart beating really hard.  + BM  Objective: Vital signs in last 24 hours: Temp:  [97.5 F (36.4 C)-98.4 F (36.9 C)] 98.4 F (36.9 C) (07/11 0203) Pulse Rate:  [93-113] 101 (07/11 0203) Cardiac Rhythm: Normal sinus rhythm (07/11 0700) Resp:  [17-35] 24 (07/11 0203) BP: (106-129)/(59-99) 112/59 (07/11 0203) SpO2:  [91 %-100 %] 97 % (07/11 0203) Weight:  [131 lb 6.4 oz (59.6 kg)] 131 lb 6.4 oz (59.6 kg) (07/11 0216)  Hemodynamic parameters for last 24 hours: CVP:  [5 mmHg] 5 mmHg  Intake/Output from previous day: 07/10 0701 - 07/11 0700 In: 562.1 [P.O.:560; I.V.:2.1] Out: 650 [Urine:650]  General appearance: alert, cooperative and no distress Heart: regular rate and rhythm and tachy Lungs: clear to auscultation bilaterally Abdomen: soft, non-tender; bowel sounds normal; no masses,  no organomegaly Extremities: edema mild pitting Wound: clean and dry  Lab Results:  Recent Labs  12/12/16 0500 12/13/16 0410  WBC 8.8 5.9  HGB 8.6* 9.2*  HCT 26.5* 28.6*  PLT 279 307   BMET:  Recent Labs  12/12/16 0500 12/13/16 0410  NA 136 138  K 3.5 4.1  CL 102 104  CO2 23 24  GLUCOSE 105* 96  BUN 11 10  CREATININE 0.59 0.58  CALCIUM 8.3* 8.4*    PT/INR: No results for input(s): LABPROT, INR in the last 72 hours. ABG    Component Value Date/Time   PHART 7.366 12/08/2016 1844   HCO3 21.8 12/08/2016 1844   TCO2 19 12/11/2016 1634   ACIDBASEDEF 3.0 (H) 12/08/2016 1844   O2SAT 56.8  12/12/2016 0530   CBG (last 3)   Recent Labs  12/12/16 1603 12/12/16 2133 12/13/16 0619  GLUCAP 93 86 87    Assessment/Plan: S/P Procedure(s) (LRB): CORONARY ARTERY BYPASS GRAFTING (CABG)x4 using left internal mammary artery and bilateral greater saphenous veins harvested endoscopically (N/A) TRANSESOPHAGEAL ECHOCARDIOGRAM (TEE) (N/A)  1. CV- Acute Systolic HF, mild Sinus Tach, BP controlled- milrinone stopped yesterday, no Co-Ox obtained this morning as central line was discontinued... Continue Coreg, Digoxin per AHF 2. Pulm- no acute issues, small posterior pleural effusion on CXR, continue IS 3. Renal- creatinine has been stable, K is at 4.1---on Lasix and Spironolactone.. Will decrease potassium supplementation to 40 meq daily 4. Expected post operative blood loss anemia- Hgb at 9.2 post transfusion, continue iron supplementation 5. Deconditioning- patient lives alone, PT evaluation complete will need SNF 6. Dispo- patient stable, mildly tachycardic will defer to AHF team for titration of BB, continue current care... Likely ready for d/c to SNF in next 24-48 hours if remains stable   LOS: 10 days    Raford Pitcher, Denny Peon 12/13/2016

## 2016-12-14 LAB — BASIC METABOLIC PANEL
Anion gap: 10 (ref 5–15)
BUN: 10 mg/dL (ref 6–20)
CO2: 25 mmol/L (ref 22–32)
Calcium: 8.1 mg/dL — ABNORMAL LOW (ref 8.9–10.3)
Chloride: 103 mmol/L (ref 101–111)
Creatinine, Ser: 0.57 mg/dL (ref 0.44–1.00)
GFR calc Af Amer: >60 mL/min (ref >60)
GFR calc non Af Amer: >60 mL/min (ref >60)
Glucose, Bld: 94 mg/dL (ref 65–99)
Potassium: 3.4 mmol/L — ABNORMAL LOW (ref 3.5–5.1)
Sodium: 138 mmol/L (ref 135–145)

## 2016-12-14 MED ORDER — MOMETASONE FURO-FORMOTEROL FUM 200-5 MCG/ACT IN AERO: 2.0000 | INHALATION_SPRAY | Freq: Two times a day (BID) | RESPIRATORY_TRACT | Status: DC

## 2016-12-14 MED ORDER — POTASSIUM CHLORIDE CRYS ER 20 MEQ PO TBCR: 60.0000 meq | EXTENDED_RELEASE_TABLET | Freq: Every day | ORAL | Status: DC

## 2016-12-14 MED ORDER — ASPIRIN 325 MG PO TBEC: 325.0000 mg | DELAYED_RELEASE_TABLET | Freq: Every day | ORAL | 0 refills | Status: DC

## 2016-12-14 MED ORDER — CARVEDILOL 3.125 MG PO TABS: 3.1250 mg | ORAL_TABLET | Freq: Two times a day (BID) | ORAL | Status: DC

## 2016-12-14 MED ORDER — FUROSEMIDE 40 MG PO TABS: 40.0000 mg | ORAL_TABLET | Freq: Every day | ORAL | Status: DC

## 2016-12-14 MED ORDER — SACUBITRIL-VALSARTAN 24-26 MG PO TABS: 1.0000 | ORAL_TABLET | Freq: Two times a day (BID) | ORAL | Status: DC

## 2016-12-14 MED ORDER — POTASSIUM CHLORIDE CRYS ER 20 MEQ PO TBCR
60.0000 meq | EXTENDED_RELEASE_TABLET | Freq: Every day | ORAL | Status: DC
Start: 2016-12-14 — End: 2016-12-15
  Administered 2016-12-14 – 2016-12-15 (×2): 60 meq via ORAL
  Filled 2016-12-14 (×2): qty 3

## 2016-12-14 MED ORDER — ATORVASTATIN CALCIUM 80 MG PO TABS: 80.0000 mg | ORAL_TABLET | Freq: Every day | ORAL | Status: DC

## 2016-12-14 MED ORDER — DIGOXIN 125 MCG PO TABS: 0.1250 mg | ORAL_TABLET | Freq: Every day | ORAL | Status: DC

## 2016-12-14 MED ORDER — TRAMADOL HCL 50 MG PO TABS: 50.0000 mg | ORAL_TABLET | ORAL | 0 refills | Status: DC | PRN

## 2016-12-14 MED ORDER — SPIRONOLACTONE 25 MG PO TABS: 25.0000 mg | ORAL_TABLET | Freq: Every day | ORAL | Status: DC

## 2016-12-14 NOTE — Clinical Social Work Note (Addendum)
[facility] has extended a bed offer. CSW faxed clinicals to [facility] for review for insurance authorization.  Charlynn Court, CSW [phone]  8:47 am CSW confirmed with [facility] that they did receive the clinicals.  Charlynn Court, CSW [phone]  10:39 am [facility] requesting today's progress notes showing that patient is stable for discharge today. CSW faxed them to [place], Materials engineer. She also stated that patient will likely require a peer-to-peer review. PA is agreeable. CSW left voicemail for pre-service coordinator with PA's pager number.  Charlynn Court, CSW [phone]  1:18 pm Pre-service coordinator did not get voicemail with PA's pager number. CSW spoke with her and provided it again.  Charlynn Court, CSW [phone]  2:55 pm CSW faxed OT evaluation to pre-service coordinator. No updates on the peer-to-peer review.  Charlynn Court, CSW [phone]

## 2016-12-14 NOTE — Progress Notes (Signed)
Advanced Heart Failure Rounding Note  Primary Cardiologist: Excell Seltzer  Subjective:    Underwent CABG x 4 on 7/6.   Milrinone stopped 7/10. Cleda Daub was increased.   Yesterday entresto was started 24-26 mg twice a day. Overall feeling better. Mild dyspnea with ambulation.    cMRI EF 24% with only small scar in apex  Objective:   Weight Range: 59.1 kg (130 lb 6.4 oz) Body mass index is 19.83 kg/m.   Vital Signs:   Temp:  [97.5 F (36.4 C)-98.5 F (36.9 C)] 97.5 F (36.4 C) (07/12 0850) Pulse Rate:  [88-114] 114 (07/12 0843) Resp:  [13-24] 19 (07/12 0850) BP: (110-123)/(60-75) 112/70 (07/12 0342) SpO2:  [90 %-96 %] 90 % (07/12 0850) FiO2 (%):  [99 %] 99 % (07/11 2151) Weight:  [59.1 kg (130 lb 6.4 oz)] 59.1 kg (130 lb 6.4 oz) (07/12 0342) Last BM Date: 12/13/16  Weight change: Filed Weights   12/12/16 0500 12/13/16 0216 12/14/16 0342  Weight: 60.7 kg (133 lb 14.4 oz) 59.6 kg (131 lb 6.4 oz) 59.1 kg (130 lb 6.4 oz)    Intake/Output:   Intake/Output Summary (Last 24 hours) at 12/14/16 0944 Last data filed at 12/14/16 0659  Gross per 24 hour  Intake                0 ml  Output                0 ml  Net                0 ml      Physical Exam  General:  Well appearing. No resp difficulty. Sitting in the chair HEENT: normal Neck: supple. JVP 6-7. Carotids 2+ bilat; no bruits. No lymphadenopathy or thryomegaly appreciated. Cor: PMI nondisplaced. Regular rate & rhythm. No rubs, gallops or murmurs. Sternal incision approximated.  Lungs: clear Abdomen: soft, nontender, nondistended. No hepatosplenomegaly. No bruits or masses. Good bowel sounds. Extremities: no cyanosis, clubbing, rash, edema Neuro: alert & orientedx3, cranial nerves grossly intact. moves all 4 extremities w/o difficulty. Affect pleasant    Telemetry   Sinus Rhythm/Sinus Tach 90-110s.    EKG    EKG from 12/05/16 with sinus tach, Personally reviewed.   Labs    CBC  Recent Labs  12/12/16 0500  12/13/16 0410  WBC 8.8 5.9  HGB 8.6* 9.2*  HCT 26.5* 28.6*  MCV 87.2 86.9  PLT 279 307   Basic Metabolic Panel  Recent Labs  12/13/16 0410 12/14/16 0327  NA 138 138  K 4.1 3.4*  CL 104 103  CO2 24 25  GLUCOSE 96 94  BUN 10 10  CREATININE 0.58 0.57  CALCIUM 8.4* 8.1*   Liver Function Tests No results for input(s): AST, ALT, ALKPHOS, BILITOT, PROT, ALBUMIN in the last 72 hours. No results for input(s): LIPASE, AMYLASE in the last 72 hours. Cardiac Enzymes No results for input(s): CKTOTAL, CKMB, CKMBINDEX, TROPONINI in the last 72 hours.  BNP: BNP (last 3 results)  Recent Labs  12/03/16 1748 12/03/16 2212  BNP 2,721.4* 2,339.4*    ProBNP (last 3 results) No results for input(s): PROBNP in the last 8760 hours.   D-Dimer No results for input(s): DDIMER in the last 72 hours. Hemoglobin A1C No results for input(s): HGBA1C in the last 72 hours. Fasting Lipid Panel No results for input(s): CHOL, HDL, LDLCALC, TRIG, CHOLHDL, LDLDIRECT in the last 72 hours. Thyroid Function Tests No results for input(s): TSH, T4TOTAL, T3FREE, THYROIDAB in  the last 72 hours.  Invalid input(s): FREET3  Other results:   Imaging    No results found.   Medications:     Scheduled Medications: . aspirin EC  325 mg Oral Daily   Or  . aspirin  324 mg Per Tube Daily  . atorvastatin  80 mg Oral q1800  . bisacodyl  10 mg Oral Daily   Or  . bisacodyl  10 mg Rectal Daily  . buPROPion  300 mg Oral Daily  . carvedilol  3.125 mg Oral BID WC  . digoxin  0.125 mg Oral Daily  . docusate sodium  200 mg Oral Daily  . enoxaparin (LOVENOX) injection  30 mg Subcutaneous QHS  . ferrous fumarate-b12-vitamic C-folic acid  1 capsule Oral TID PC  . furosemide  40 mg Oral Daily  . mometasone-formoterol  2 puff Inhalation BID  . pantoprazole  40 mg Oral Daily  . potassium chloride  60 mEq Oral Daily  . sacubitril-valsartan  1 tablet Oral BID  . sertraline  200 mg Oral Daily  . sodium  chloride flush  3 mL Intravenous Q12H  . sodium chloride flush  3 mL Intravenous Q12H  . spironolactone  25 mg Oral Daily    Infusions: . sodium chloride    . sodium chloride    . potassium chloride Stopped (12/11/16 1145)    PRN Medications: sodium chloride, levalbuterol, magnesium hydroxide, ondansetron (ZOFRAN) IV, oxyCODONE, potassium chloride, sodium chloride flush, sodium chloride flush, traMADol    Patient Profile   [name] is a 71 y/o woman with h/o HTN and tobacco abuse admitted 7/1 with NSTEMI (peak trop 7.95) and acute HF found to have critical proximal LAD lesion with EF 20-25%.   Assessment/Plan   1. NSTEMI/CAD -- Critical pLAD disease with ulcerated plaque at bifurcation with D1 as well as RPDA 90% lesion.  RHC ok. PFTs with severe COPD. Carotid u/s ok.  -- S/P CABG x4 . On ASA and statin.   2. Acute systolic HF -- Echo EF 20-25% with normal RV -- cMRI EF 24% with only small scar in apex. RHC with normal CO and no PAH -- Suspect EF will recover with CABG. --CABG x 4.  - Volume status stable.  -- Continue dig 0.125 mg daily. - Continue carvedilol 3.125 mg twice a day.  - Continue lasix 40 mg daily - Tolerating entresto. BP ok.  - Continue spiro daily.  -Renal function stable.  3. PAD -- Stable  4. Tobacco use/COPD -- PFTs show severe COPD. Continue pulmonary toilet.   5. Hypokalemia- K 3.4. Increased potassium supplement earlier today.      6. Anemia -Most recent hgb 9.2 .     HF meds for d/c:  Lasix 40 mg daily Entresto 24-26 mg twice a day Carvedilol 3.125 mg twice a day Digoxin 0.125 mg daily Spiro 25 mg daily Kcl 40 daily  Length of Stay: 11  [name], NP  12/14/2016, 9:44 AM  Advanced Heart Failure Team Pager [phone] (M-F; 7a - 4p)  Please contact [facility]  Patient seen and examined with Tonye Becket, NP. We discussed all aspects of the encounter. I agree with the assessment and plan as stated above.   Improving steadily but still  weak. Pending d/c to SNF in am. Volume status ok. Agree with d/c meds as above. Will need to BMET next week to make sure K is ok. F/u in HF Clinic.   Arvilla Meres, MD  9:57 PM

## 2016-12-14 NOTE — Progress Notes (Signed)
OT Cancellation Note  Patient Details Name: Latoya Adams MRN: [id number] DOB: 06-01-1946   Cancelled Treatment:    Reason Eval/Treat Not Completed: Patient at procedure or test/ unavailable.  Pt is with cardiac rehab.  Will try back.  Carvin Almas [place], OTR/L [phone]   Jeani Hawking M 12/14/2016, 12:10 PM

## 2016-12-14 NOTE — Care Management Important Message (Signed)
Important Message  Patient Details  Name: Latoya Adams MRN: [id number] Date of Birth: 11-22-1945   Medicare Important Message Given:  Other (see comment) Patient is out of Room with PT unsigned copy left in the room   [facility] 12/14/2016, 10:21 AM

## 2016-12-14 NOTE — Progress Notes (Signed)
Planning d/c today. Ed completed with pt and also son on the phone. Voiced understanding. Pt emotional when speaking about quitting smoking. She is planning to quit. Declined fake cigarette, stating it looks like what almost killed her. Also discussed HF management, daily wts, low sodium. Will refer to [facility]. Set up HF video  And also gave smoking cessation video to view. HR elevated at 105-110 ST today with PVCs every 3-4 bts.  [id number] Ethelda Chick CES, ACSM 12:42 PM 12/14/2016

## 2016-12-14 NOTE — Evaluation (Signed)
Occupational Therapy Evaluation Patient Details Name: Jonessa Heflin MRN: [id number] DOB: Nov 19, 1945 Today's Date: 12/14/2016    History of Present Illness Ms. Matecki is a 71 y/o woman with h/o HTN and tobacco abuse admitted 7/1 with NSTEMI (peak trop 7.95) and acute HF found to have critical proximal LAD lesion with EF 20-25%., PMH: CHF,hypokalimia, PAD, HTN.    Clinical Impression   Pt admitted with above, and demonstrates the below listed deficits.  She requires min guard assist for basic ADLs and min A for functional transfers.  She requires frequent rest breaks during ADLs with DOE 3/4 and HR to 132. Pt lives alone in 3 floor apartment.  At present, pt is unable to safely manage independently over a 24 hour period of time placing her at risk for fall, decompensation, malnutrition due to being too fatigued to perform meal prep, or eat properly.  Feel she would benefit from SNF level rehab to maximize her safety and independence.  She should be able to return [facility] at [facility] after rehab stay.  Per, chart, pt for discharge today. Acute OT will sign off, and defer further OT needs to next venue.      Follow Up Recommendations  SNF;Supervision/Assistance - 24 hour    Equipment Recommendations  None recommended by OT    Recommendations for Other Services       Precautions / Restrictions Precautions Precautions: Fall;Sternal      Mobility Bed Mobility               General bed mobility comments: Pt sitting up in recliner   Transfers Overall transfer level: Needs assistance Equipment used: Rolling walker (2 wheeled) Transfers: Sit to/from Stand;Stand Pivot Transfers Sit to Stand: Min guard;Min assist Stand pivot transfers: Min guard       General transfer comment: min A from toilet     Balance Overall balance assessment: Needs assistance Sitting-balance support: No upper extremity supported;Feet supported Sitting balance-Leahy Scale: Good     Standing balance  support: During functional activity Standing balance-Leahy Scale: Fair                             ADL either performed or assessed with clinical judgement   ADL Overall ADL's : Needs assistance/impaired Eating/Feeding: Independent;Sitting   Grooming: Wash/dry hands;Wash/dry face;Oral care;Brushing hair;Supervision/safety;Standing   Upper Body Bathing: Set up;Sitting   Lower Body Bathing: Min guard;Sit to/from stand   Upper Body Dressing : Set up;Sitting   Lower Body Dressing: Min guard;Sit to/from stand   Toilet Transfer: Minimal assistance;Ambulation;Comfort height toilet;RW Statistician Details (indicate cue type and reason): assist to power up into standing  Toileting- Clothing Manipulation and Hygiene: Min guard;Sit to/from stand       Functional mobility during ADLs: Min guard;Rolling walker General ADL Comments: HR to 132 with activity.  Pt requires frequent rest breaks with DOE 3/4 during basic ADL tasks      Vision         Perception     Praxis      Pertinent Vitals/Pain Pain Assessment: Faces Faces Pain Scale: Hurts a little bit Pain Location: incisional site with movement Pain Descriptors / Indicators: Grimacing Pain Intervention(s): Monitored during session     Hand Dominance     Extremity/Trunk Assessment Upper Extremity Assessment Upper Extremity Assessment: Generalized weakness   Lower Extremity Assessment Lower Extremity Assessment: Defer to PT evaluation   Cervical / Trunk Assessment Cervical / Trunk  Assessment: Normal   Communication Communication Communication: No difficulties   Cognition Arousal/Alertness: Awake/alert Behavior During Therapy: WFL for tasks assessed/performed Overall Cognitive Status: Within Functional Limits for tasks assessed                                     General Comments  requires min cues for sternal precautions     Exercises     Shoulder Instructions      Home Living  Family/patient expects to be discharged to:: Skilled nursing facility Living Arrangements: Alone Available Help at Discharge: Family;Available 24 hours/day Type of Home: Apartment Home Access: Stairs to enter [employer] of Steps: 3 flights Entrance Stairs-Rails: Right;Left Home Layout: One level     Bathroom Shower/Tub: Chief Strategy Officer: Standard Bathroom Accessibility: Yes   Home Equipment: None          Prior Functioning/Environment Level of Independence: Independent        Comments: Pt still driving.  Retired, but worked part time at [employer] List: Decreased strength;Decreased activity tolerance;Impaired balance (sitting and/or standing);Decreased knowledge of use of DME or AE;Decreased knowledge of precautions;Cardiopulmonary status limiting activity      OT Treatment/Interventions:      OT Goals(Current goals can be found in the care plan section) Acute Rehab OT Goals Patient Stated Goal: to rehab and get stronger  OT Goal Formulation: All assessment and education complete, DC therapy  OT Frequency:     Barriers to D/C:            Co-evaluation              AM-PAC PT "6 Clicks" Daily Activity     Outcome Measure Help from another person eating meals?: None Help from another person taking care of personal grooming?: A Little Help from another person toileting, which includes using toliet, bedpan, or urinal?: A Little Help from another person bathing (including washing, rinsing, drying)?: A Little Help from another person to put on and taking off regular upper body clothing?: A Little Help from another person to put on and taking off regular lower body clothing?: A Little 6 Click Score: 19   End of Session Equipment Utilized During Treatment: Rolling walker Nurse Communication: Mobility status  Activity Tolerance: Patient limited by fatigue Patient left: in chair;with call bell/phone within reach  OT  Visit Diagnosis: Unsteadiness on feet (R26.81)                Time: 4098-1191 OT Time Calculation (min): 42 min Charges:  OT General Charges $OT Visit: 1 Procedure OT Evaluation $OT Eval Moderate Complexity: 1 Procedure OT Treatments $Self Care/Home Management : 8-22 mins $Therapeutic Activity: 8-22 mins G-Codes:     [employer], OTR/L [phone]   Jeani Hawking M 12/14/2016, 1:37 PM

## 2016-12-14 NOTE — Progress Notes (Addendum)
      [address]       [place] [address]             [phone]      6 Days Post-Op Procedure(s) (LRB): CORONARY ARTERY BYPASS GRAFTING (CABG)x4 using left internal mammary artery and bilateral greater saphenous veins harvested endoscopically (N/A) TRANSESOPHAGEAL ECHOCARDIOGRAM (TEE) (N/A)   Subjective:  Ms. Latoya Adams is doing well.  She continues to have some anxiety when she is up and ambulating, stating she feel short of breath.  Objective: Vital signs in last 24 hours: Temp:  [97.8 F (36.6 C)-98.5 F (36.9 C)] 98.5 F (36.9 C) (07/12 0342) Pulse Rate:  [88-109] 103 (07/12 0342) Cardiac Rhythm: Sinus tachycardia (07/11 2032) Resp:  [13-24] 23 (07/12 0342) BP: (110-123)/(60-77) 112/70 (07/12 0342) SpO2:  [90 %-96 %] 90 % (07/12 0342) FiO2 (%):  [99 %] 99 % (07/11 2151) Weight:  [130 lb 6.4 oz (59.1 kg)] 130 lb 6.4 oz (59.1 kg) (07/12 0342)  Intake/Output from previous day: 07/11 0701 - 07/12 0700 In: 60 [P.O.:60] Out: -   General appearance: alert, cooperative and no distress Heart: regular rate and rhythm and mildly tachy Lungs: clear to auscultation bilaterally Abdomen: soft, non-tender; bowel sounds normal; no masses,  no organomegaly Extremities: edema trace Wound: clean and dry  Lab Results:  Recent Labs  12/12/16 0500 12/13/16 0410  WBC 8.8 5.9  HGB 8.6* 9.2*  HCT 26.5* 28.6*  PLT 279 307   BMET:  Recent Labs  12/13/16 0410 12/14/16 0327  NA 138 138  K 4.1 3.4*  CL 104 103  CO2 24 25  GLUCOSE 96 94  BUN 10 10  CREATININE 0.58 0.57  CALCIUM 8.4* 8.1*    PT/INR: No results for input(s): LABPROT, INR in the last 72 hours. ABG    Component Value Date/Time   PHART 7.366 12/08/2016 1844   HCO3 21.8 12/08/2016 1844   TCO2 19 12/11/2016 1634   ACIDBASEDEF 3.0 (H) 12/08/2016 1844   O2SAT 56.8 12/12/2016 0530   CBG (last 3)   Recent Labs  12/12/16 1603 12/12/16 2133 12/13/16 0619  GLUCAP 93 86 87     Assessment/Plan: S/P Procedure(s) (LRB): CORONARY ARTERY BYPASS GRAFTING (CABG)x4 using left internal mammary artery and bilateral greater saphenous veins harvested endoscopically (N/A) TRANSESOPHAGEAL ECHOCARDIOGRAM (TEE) (N/A)  1. CV- Acute systolic HF, remains tachy, BP okay- continue Digoxin, Coreg, and Entresto per AHF team 2. Pulm- no acute issues, dyspnea patient relates to anxiety when ambulating 3. Renal- creatinine WNL, K is down to 3.4, will increase potassium to 60 meq daily 4. Expected post operative blood loss anemia- hgb is stable 5. Dispo- patient doing well, if patient is stable per AHF team for discharge to SNF when bed becomes available   LOS: 11 days    BARRETT, ERIN 12/14/2016  I have seen and examined the patient and agree with the assessment and plan as outlined.  Looks ready for d/c to SNF  Purcell Nails, MD 12/14/2016 9:54 AM

## 2016-12-15 LAB — BASIC METABOLIC PANEL
Anion gap: 8 (ref 5–15)
BUN: 11 mg/dL (ref 6–20)
CO2: 24 mmol/L (ref 22–32)
Calcium: 8 mg/dL — ABNORMAL LOW (ref 8.9–10.3)
Chloride: 101 mmol/L (ref 101–111)
Creatinine, Ser: 0.66 mg/dL (ref 0.44–1.00)
GFR calc Af Amer: >60 mL/min (ref >60)
GFR calc non Af Amer: >60 mL/min (ref >60)
Glucose, Bld: 99 mg/dL (ref 65–99)
Potassium: 3.7 mmol/L (ref 3.5–5.1)
Sodium: 133 mmol/L — ABNORMAL LOW (ref 135–145)

## 2016-12-15 NOTE — Progress Notes (Signed)
CARDIAC REHAB PHASE I   PRE:  Rate/Rhythm: 116 ST with PVCs at rest    BP: sitting 106/64    SaO2: 93 RA  MODE:  Ambulation: 220 ft   POST:  Rate/Rhythm: 132 ST    BP: sitting 107/74     SaO2: 94 RA  Pt used rollator and gait belt for assist. She fatigues easily with many rest stops. HR elevated. Tired after walk, to recliner. Assist to elevate legs. No questions, all answered yesterday.  [phone]   Latoya Adams CES, ACSM 12/15/2016 10:48 AM

## 2016-12-15 NOTE — Progress Notes (Signed)
Patient in a stable condition, discharge education reviewed with patient she verbalized understanding, iv removed , tele dc ccmd notified, patient belongings at bedside, patient transported to snif by EMS. Report called to [facility] nurse at [employer].

## 2016-12-15 NOTE — Progress Notes (Signed)
      [address]       [place] [address]             [phone]      7 Days Post-Op Procedure(s) (LRB): CORONARY ARTERY BYPASS GRAFTING (CABG)x4 using left internal mammary artery and bilateral greater saphenous veins harvested endoscopically (N/A) TRANSESOPHAGEAL ECHOCARDIOGRAM (TEE) (N/A)   Subjective:  Ms. Gudeman has no new complaints.  She is anxious to go to SNF and is worried what she will do if it doesn't get approved.  Objective: Vital signs in last 24 hours: Temp:  [97.5 F (36.4 C)-98.1 F (36.7 C)] 98.1 F (36.7 C) (07/13 0400) Pulse Rate:  [97-114] 97 (07/13 0400) Cardiac Rhythm: Sinus tachycardia (07/13 0722) Resp:  [15-21] 19 (07/13 0400) BP: (95-103)/(45-59) 95/45 (07/13 0400) SpO2:  [90 %-100 %] 100 % (07/13 0400) Weight:  [134 lb 4.2 oz (60.9 kg)] 134 lb 4.2 oz (60.9 kg) (07/13 0400)  Intake/Output from previous day: 07/12 0701 - 07/13 0700 In: 720 [P.O.:720] Out: -   General appearance: alert, cooperative and no distress Heart: regular rate and rhythm Lungs: clear to auscultation bilaterally Abdomen: soft, non-tender; bowel sounds normal; no masses,  no organomegaly Extremities: edema trace Wound: clean and dry  Lab Results:  Recent Labs  12/13/16 0410  WBC 5.9  HGB 9.2*  HCT 28.6*  PLT 307   BMET:  Recent Labs  12/14/16 0327 12/15/16 0217  NA 138 133*  K 3.4* 3.7  CL 103 101  CO2 25 24  GLUCOSE 94 99  BUN 10 11  CREATININE 0.57 0.66  CALCIUM 8.1* 8.0*    PT/INR: No results for input(s): LABPROT, INR in the last 72 hours. ABG    Component Value Date/Time   PHART 7.366 12/08/2016 1844   HCO3 21.8 12/08/2016 1844   TCO2 19 12/11/2016 1634   ACIDBASEDEF 3.0 (H) 12/08/2016 1844   O2SAT 56.8 12/12/2016 0530   CBG (last 3)   Recent Labs  12/12/16 1603 12/12/16 2133 12/13/16 0619  GLUCAP 93 86 87    Assessment/Plan: S/P Procedure(s) (LRB): CORONARY ARTERY BYPASS GRAFTING (CABG)x4 using left internal  mammary artery and bilateral greater saphenous veins harvested endoscopically (N/A) TRANSESOPHAGEAL ECHOCARDIOGRAM (TEE) (N/A)  1. CV- Acute Systolic HF, mild tachycardia, BP stable- continue Coreg, Digoxin, Entresto 2. Pulm- no acute issues, dyspnea with anxiety remains stable 3. Renal- creatinine stable, K WNL 4. Deconditioning- patient working with PT, who recommends SNF, she lives alone, several hours from heart surgery program... She does not have help... She is not an appropriate candidate for discharge home, however [employer] has not approved SNF requesting peer to peer review... They were suppose to contact me yesterday and failed to do so.   5. Dispo- patient stable, ready for d/c to SNF, however [employer] failed to contact provider, despite CSW providing my requested contact information.   LOS: 12 days    Lowella Dandy 12/15/2016

## 2016-12-15 NOTE — Progress Notes (Signed)
Physical Therapy Treatment Patient Details Name: Latoya Adams MRN: [id number] DOB: [birth year] Today's Date: 12/15/2016    History of Present Illness Latoya Adams is a 71 y/o woman with h/o HTN and tobacco abuse admitted 7/1 with NSTEMI (peak trop 7.95) and acute HF found to have critical proximal LAD lesion with EF 20-25%., PMH: CHF,hypokalimia, PAD, HTN.     PT Comments    Pt is making steady progress towards her goals. Pt is min guard for transfers while maintaining sternal precautions and for ambulation of 250 with RW. Pt is becoming less anxious with mobility and was able to carry on minimal conversation as she ambulated with appropriate increase in HR for ambulation. Pt requires skilled PT to progress gait training and to improve LE strength and endurance to safely mobilize in her discharge environment.   Follow Up Recommendations  SNF;Supervision/Assistance - 24 hour     Equipment Recommendations  Other (comment) (TBA)       Precautions / Restrictions Precautions Precautions: Fall;Sternal Restrictions Weight Bearing Restrictions: No    Mobility  Bed Mobility               General bed mobility comments: in recliner at entry  Transfers Overall transfer level: Needs assistance   Transfers: Sit to/from Stand Sit to Stand: Min guard         General transfer comment: used hands on knees technique, vc for scooting further out in chair before attempting powerup   Ambulation/Gait Ambulation/Gait assistance: Min guard Ambulation Distance (Feet): 250 Feet Assistive device: Rolling walker (2 wheeled) (Eva walker) Gait Pattern/deviations: Step-through pattern;Decreased stride length Gait velocity: slowed Gait velocity interpretation: Below normal speed for age/gender General Gait Details: slow, steady gait, vc for continued focus on a point in the distance and continuing conversation to shift focus from the anxiety she experiences while she is walking       Balance  Overall balance assessment: Needs assistance Sitting-balance support: No upper extremity supported;Feet supported Sitting balance-Leahy Scale: Good     Standing balance support: Bilateral upper extremity supported;During functional activity Standing balance-Leahy Scale: Fair Standing balance comment: can stand statically without UE support                            Cognition Arousal/Alertness: Awake/alert Behavior During Therapy: WFL for tasks assessed/performed Overall Cognitive Status: Within Functional Limits for tasks assessed                                           General Comments General comments (skin integrity, edema, etc.): Pt experienced mild SoB with ambulation HR 115bpm and SaO2 on RA 96%O2      Pertinent Vitals/Pain Pain Assessment: Faces Faces Pain Scale: Hurts a little bit Pain Location: incisional site with movement Pain Descriptors / Indicators: Grimacing Pain Intervention(s): Monitored during session           PT Goals (current goals can now be found in the care plan section) Acute Rehab PT Goals Patient Stated Goal: to go home after rehab PT Goal Formulation: With patient Time For Goal Achievement: 12/26/16 Potential to Achieve Goals: Good Progress towards PT goals: Progressing toward goals    Frequency    Min 3X/week      PT Plan Current plan remains appropriate       AM-PAC PT "6 Clicks" Daily Activity  Outcome Measure  Difficulty turning over in bed (including adjusting bedclothes, sheets and blankets)?: A Lot Difficulty moving from lying on back to sitting on the side of the bed? : A Lot Difficulty sitting down on and standing up from a chair with arms (e.g., wheelchair, bedside commode, etc,.)?: A Little Help needed moving to and from a bed to chair (including a wheelchair)?: A Little Help needed walking in hospital room?: A Little Help needed climbing 3-5 steps with a railing? : A Lot 6 Click Score:  15    End of Session Equipment Utilized During Treatment: Gait belt Activity Tolerance: Patient limited by fatigue Patient left: with call bell/phone within reach;in bed;with family/visitor present Nurse Communication: Mobility status PT Visit Diagnosis: Unsteadiness on feet (R26.81);Muscle weakness (generalized) (M62.81)     Time: 1300-1316 PT Time Calculation (min) (ACUTE ONLY): 16 min  Charges:  $Gait Training: 8-22 mins                    G Codes:       Latoya Adams Latoya Adams PT, DPT Acute Rehabilitation  [phone] Pager [phone]     Latoya Adams 12/15/2016, 1:49 PM

## 2016-12-15 NOTE — Progress Notes (Signed)
CSW following to facilitate discharge. CSW contacted [facility] case manager for insurance authorization, Marisue Humble, to check on status of authorization request. CSW left voicemail for case manager to return call and give an update.   CSW will update with information when received from case manager.  Blenda Nicely, [place] Clinical Social Worker [phone]

## 2016-12-15 NOTE — Progress Notes (Signed)
Discharge to: [facility] Anticipated discharge date: 12/15/16 Family notified: Yes, by phone Transportation by: [facility]  Report #: [phone], Room 108  CSW signing off.  Blenda Nicely LCSW [phone]

## 2017-01-03 ENCOUNTER — Telehealth (HOSPITAL_COMMUNITY): Payer: Self-pay | Admitting: Pharmacist

## 2017-01-03 ENCOUNTER — Other Ambulatory Visit (HOSPITAL_COMMUNITY): Payer: Self-pay | Admitting: Pharmacist

## 2017-01-03 NOTE — Telephone Encounter (Signed)
Received call from [facility] stating they needed at [facility] for patient's Entresto. Completed PA via CoverMyMeds and received approval. PA Case #: [id number]   Remi Haggard, PharmD, BCPS 01/03/2017 4:11 PM

## 2017-01-05 ENCOUNTER — Telehealth (HOSPITAL_COMMUNITY): Payer: Self-pay | Admitting: Pharmacist

## 2017-01-05 NOTE — Telephone Encounter (Signed)
Entresto 24-26 mg BID PA approved by [facility] Part D through 01/05/19.   Tyler Deis. Bonnye Fava, PharmD, BCPS, CPP Clinical Pharmacist Pager: [phone] Phone: [phone] 01/05/2017 10:43 AM

## 2017-01-08 ENCOUNTER — Encounter (HOSPITAL_COMMUNITY): Payer: Self-pay

## 2017-01-08 ENCOUNTER — Ambulatory Visit (HOSPITAL_COMMUNITY)
Admission: RE | Admit: 2017-01-08 | Discharge: 2017-01-08 | Disposition: A | Discharge status: Home / Self Care | Payer: Medicare PPO | Source: Ambulatory Visit | Attending: Internal Medicine | Admitting: Internal Medicine

## 2017-01-08 VITALS — BP 102/54 | HR 99 | Wt 121.4 lb

## 2017-01-08 DIAGNOSIS — Z7982 Long term (current) use of aspirin: Secondary | ICD-10-CM | POA: Insufficient documentation

## 2017-01-08 DIAGNOSIS — I252 Old myocardial infarction: Secondary | ICD-10-CM | POA: Insufficient documentation

## 2017-01-08 DIAGNOSIS — Z885 Allergy status to narcotic agent status: Secondary | ICD-10-CM | POA: Insufficient documentation

## 2017-01-08 DIAGNOSIS — I214 Non-ST elevation (NSTEMI) myocardial infarction: Secondary | ICD-10-CM | POA: Diagnosis not present

## 2017-01-08 DIAGNOSIS — I2510 Atherosclerotic heart disease of native coronary artery without angina pectoris: Secondary | ICD-10-CM | POA: Insufficient documentation

## 2017-01-08 DIAGNOSIS — I5022 Chronic systolic (congestive) heart failure: Secondary | ICD-10-CM | POA: Insufficient documentation

## 2017-01-08 DIAGNOSIS — E876 Hypokalemia: Secondary | ICD-10-CM | POA: Insufficient documentation

## 2017-01-08 DIAGNOSIS — I110 Hypertensive heart disease with heart failure: Secondary | ICD-10-CM | POA: Diagnosis not present

## 2017-01-08 DIAGNOSIS — I10 Essential (primary) hypertension: Secondary | ICD-10-CM

## 2017-01-08 DIAGNOSIS — J449 Chronic obstructive pulmonary disease, unspecified: Secondary | ICD-10-CM | POA: Diagnosis not present

## 2017-01-08 DIAGNOSIS — Z87891 Personal history of nicotine dependence: Secondary | ICD-10-CM | POA: Insufficient documentation

## 2017-01-08 DIAGNOSIS — Z720 Tobacco use: Secondary | ICD-10-CM | POA: Insufficient documentation

## 2017-01-08 DIAGNOSIS — Z8249 Family history of ischemic heart disease and other diseases of the circulatory system: Secondary | ICD-10-CM | POA: Diagnosis not present

## 2017-01-08 DIAGNOSIS — Z951 Presence of aortocoronary bypass graft: Secondary | ICD-10-CM

## 2017-01-08 DIAGNOSIS — D649 Anemia, unspecified: Secondary | ICD-10-CM | POA: Insufficient documentation

## 2017-01-08 LAB — BASIC METABOLIC PANEL
ANION GAP: 10 (ref 5–15)
BUN: 10 mg/dL (ref 6–20)
CALCIUM: 8.9 mg/dL (ref 8.9–10.3)
CO2: 25 mmol/L (ref 22–32)
CREATININE: 0.77 mg/dL (ref 0.44–1.00)
Chloride: 106 mmol/L (ref 101–111)
Glucose, Bld: 107 mg/dL — ABNORMAL HIGH (ref 65–99)
Potassium: 4.2 mmol/L (ref 3.5–5.1)
Sodium: 141 mmol/L (ref 135–145)

## 2017-01-08 LAB — DIGOXIN LEVEL: Digoxin Level: 0.5 ng/mL — ABNORMAL LOW (ref 0.8–2.0)

## 2017-01-08 MED ORDER — ATORVASTATIN CALCIUM 80 MG PO TABS: 80.0000 mg | ORAL_TABLET | Freq: Every day | ORAL | 0 refills | Status: DC

## 2017-01-08 MED ORDER — DIGOXIN 125 MCG PO TABS: 0.1250 mg | ORAL_TABLET | Freq: Every day | ORAL | 3 refills | Status: DC

## 2017-01-08 MED ORDER — POTASSIUM CHLORIDE CRYS ER 20 MEQ PO TBCR: 60.0000 meq | EXTENDED_RELEASE_TABLET | Freq: Every day | ORAL | 0 refills | Status: DC

## 2017-01-08 MED ORDER — DIGOXIN 125 MCG PO TABS: 0.1250 mg | ORAL_TABLET | Freq: Every day | ORAL | 0 refills | Status: DC

## 2017-01-08 MED ORDER — CARVEDILOL 3.125 MG PO TABS: 3.1250 mg | ORAL_TABLET | Freq: Two times a day (BID) | ORAL | 3 refills | Status: DC

## 2017-01-08 MED ORDER — SPIRONOLACTONE 25 MG PO TABS: 25.0000 mg | ORAL_TABLET | Freq: Every day | ORAL | 3 refills | Status: DC

## 2017-01-08 MED ORDER — LOSARTAN POTASSIUM 25 MG PO TABS: 12.5000 mg | ORAL_TABLET | Freq: Every day | ORAL | 3 refills | Status: DC

## 2017-01-08 MED ORDER — FUROSEMIDE 40 MG PO TABS: 40.0000 mg | ORAL_TABLET | Freq: Every day | ORAL | 0 refills | Status: DC

## 2017-01-08 MED ORDER — SPIRONOLACTONE 25 MG PO TABS: 25.0000 mg | ORAL_TABLET | Freq: Every day | ORAL | 0 refills | Status: DC

## 2017-01-08 MED ORDER — ATORVASTATIN CALCIUM 80 MG PO TABS: 80.0000 mg | ORAL_TABLET | Freq: Every day | ORAL | 3 refills | Status: DC

## 2017-01-08 MED ORDER — POTASSIUM CHLORIDE CRYS ER 20 MEQ PO TBCR: 60.0000 meq | EXTENDED_RELEASE_TABLET | Freq: Every day | ORAL | 3 refills | Status: DC

## 2017-01-08 MED ORDER — CARVEDILOL 3.125 MG PO TABS: 3.1250 mg | ORAL_TABLET | Freq: Two times a day (BID) | ORAL | 0 refills | Status: DC

## 2017-01-08 MED ORDER — LOSARTAN POTASSIUM 25 MG PO TABS: 12.5000 mg | ORAL_TABLET | Freq: Every day | ORAL | 0 refills | Status: DC

## 2017-01-08 MED ORDER — FUROSEMIDE 40 MG PO TABS: 40.0000 mg | ORAL_TABLET | Freq: Every day | ORAL | 3 refills | Status: DC

## 2017-01-08 NOTE — Progress Notes (Signed)
Advanced Heart Failure Clinic Note   Primary Care: Dr. Lois Huxley in [facility] Primary Cardiologist:  HPI:  Latoya Adams is a 71 y.o. female with history of CAD s/ CABG x 4 12/08/2016, chronic systolic CHF cMRI EF 24% with only small apex scar, Tobacco use, COPD, and chronic anemia.   Pt admitted 12/03/2016 with NSTEMI, Troponin peak 8.5. Taken for urgent cath as below which showed severe multivessel disease and depressed EF. Echo with LVEF ~ 25%. cMRI viability study only showed small scar. Taken for CABG on 12/08/16. Pt recovery was relatively unremarkable. She tolerated adjustment of her HF medications and was sent to SNF on discharge. Discharge weight was 134 lbs.   She presents today for post hospital follow up. Has been feeling great overall. Weight at home ~ 120-121 which is her pre-op normal weight. She denies SOB or CP. She is slowly increasing her activity. Would like to try cardiac rehab. SNF sent her without any refills for [facility], so she has been off since 12/29/16. Denies lightheadedness, dizziness, or orthopnea. No PND or bendopnea. Gets around the grocery store OK, sometimes sits on her rollator and has family push her, but tries to walk as much as she can.   Review of systems complete and found to be negative unless listed in HPI.    Past Medical History:  Diagnosis Date  . Alcohol abuse   . Hypertension     Current Outpatient Prescriptions  Medication Sig Dispense Refill  . aspirin EC 325 MG EC tablet Take 1 tablet (325 mg total) by mouth daily. 30 tablet 0  . atorvastatin (LIPITOR) 80 MG tablet Take 1 tablet (80 mg total) by mouth daily at 6 PM.    . buPROPion (WELLBUTRIN XL) 300 MG 24 hr tablet Take 300 mg by mouth daily.    . carvedilol (COREG) 3.125 MG tablet Take 1 tablet (3.125 mg total) by mouth 2 (two) times daily with a meal.    . digoxin (LANOXIN) 0.125 MG tablet Take 1 tablet (0.125 mg total) by mouth daily.    . furosemide (LASIX) 40 MG tablet Take 1 tablet  (40 mg total) by mouth daily. 30 tablet   . mometasone-formoterol (DULERA) 200-5 MCG/ACT AERO Inhale 2 puffs into the lungs 2 (two) times daily.    . potassium chloride SA (K-DUR,KLOR-CON) 20 MEQ tablet Take 3 tablets (60 mEq total) by mouth daily.    . sertraline (ZOLOFT) 100 MG tablet Take 200 mg by mouth daily.    Marland Kitchen spironolactone (ALDACTONE) 25 MG tablet Take 1 tablet (25 mg total) by mouth daily.    . traMADol (ULTRAM) 50 MG tablet Take 1-2 tablets (50-100 mg total) by mouth every 4 (four) hours as needed for moderate pain. 30 tablet 0  . vitamin B-12 (CYANOCOBALAMIN) 100 MCG tablet Take 100 mcg by mouth daily.    . sacubitril-valsartan (ENTRESTO) 24-26 MG Take 1 tablet by mouth 2 (two) times daily. (Patient not taking: Reported on 01/08/2017) 60 tablet    No current facility-administered medications for this encounter.    Allergies  Allergen Reactions  . Codeine Hives    Social History   Social History  . Marital status: Single    Spouse name: N/A  . Number of children: N/A  . Years of education: N/A   Occupational History  . Not on file.   Social History Main Topics  . Smoking status: Former Smoker    Types: Cigarettes    Quit date: 12/03/2016  .  Smokeless tobacco: Never Used  . Alcohol use No  . Drug use: No  . Sexual activity: Not on file   Other Topics Concern  . Not on file   Social History Narrative  . No narrative on file      Family History  Problem Relation Age of Onset  . Heart attack Neg Hx     Vitals:   01/08/17 1350  BP: (!) 102/54  Pulse: 99  SpO2: 100%  Weight: 121 lb 6.4 oz (55.1 kg)   Wt Readings from Last 3 Encounters:  01/08/17 121 lb 6.4 oz (55.1 kg)  12/15/16 134 lb 4.2 oz (60.9 kg)   PHYSICAL EXAM: General:  Well appearing. No respiratory difficulty HEENT: normal Neck: supple. no JVD. Carotids 2+ bilat; no bruits. No lymphadenopathy or thyromegaly appreciated. Cor: PMI nondisplaced. Regular rate & rhythm. No rubs, gallops or  murmurs. Sternotomy well healed Lungs: clear Abdomen: soft, nontender, nondistended. No hepatosplenomegaly. No bruits or masses. Good bowel sounds. Extremities: no cyanosis, clubbing, rash, edema Neuro: alert & oriented x 3, cranial nerves grossly intact. moves all 4 extremities w/o difficulty. Affect pleasant.  ECG: Personally reviewed, NSR 96 bpm  ASSESSMENT & PLAN:  1. Chronic systolic HF - Echo EF 20-25% with normal RV - cMRI EF 24% with only small scar in apex. RHC with normal CO and no PAH - s/p CABG x 4.  - Volume status stable on exam.  - Continue dig 0.125 mg daily. - Continue carvedilol 3.125 mg twice a day.  - Continue lasix 40 mg daily. Discussed sliding scale diuretics today.  - Has been off Entresto for ~ 2 weeks and pressures soft. Will restart on low dose losartan at 12.5 mg qhs and see back in several weeks to attempt re-starting.   - Continue spiro daily.  -Renal function stable  2. NSTEMI/CAD -- Critical pLAD disease with ulcerated plaque at bifurcation with D1 as well as RPDA 90% lesion.  RHC ok. PFTs with severe COPD. Carotid u/s ok.  -- S/P CABG x4 . On ASA and statin.  - Suspect that her EF will improve now that she is s/p CABG.  - Will refer for cardiac rehab.   3. PAD -- Stable.   4. Tobacco use/COPD -- PFTs show severe COPD.  - She remains abstinent from cigarettes.   5. Hypokalemia - BMET today.   Graciella Freer, PA-C 01/08/17   Greater than 50% of the 25 minute visit was spent in counseling/coordination of care regarding disease state education, sliding scale diuretics, salt/fluid restriction, medication reconciliation, and increasing activity.

## 2017-01-08 NOTE — Patient Instructions (Signed)
All refills sent to [facility].  Routine lab work today. Will notify you of abnormal results, otherwise no news is good news!  Will refer you to [facility] in [place].  STOP Entresto.  START Losartan 12.5 mg (1/2 tablet) once daily at bedtime.  Follow up 3 weeks with Elizabeth Palau CHF clinical pharmacist.  Follow up 6-8 weeks with Dr. [name].  Take all medication as prescribed the day of your appointment. Bring all medications with you to your appointment.  Do the following things EVERYDAY: 1) Weigh yourself in the morning before breakfast. Write it down and keep it in a log. 2) Take your medicines as prescribed 3) Eat low salt foods-Limit salt (sodium) to 2000 mg per day.  4) Stay as active as you can everyday 5) Limit all fluids for the day to less than 2 liters

## 2017-01-09 ENCOUNTER — Other Ambulatory Visit: Payer: Self-pay | Admitting: Cardiothoracic Surgery

## 2017-01-09 DIAGNOSIS — Z951 Presence of aortocoronary bypass graft: Secondary | ICD-10-CM

## 2017-01-10 ENCOUNTER — Encounter (HOSPITAL_COMMUNITY): Payer: Self-pay

## 2017-01-10 ENCOUNTER — Other Ambulatory Visit (HOSPITAL_COMMUNITY): Payer: Self-pay | Admitting: Pharmacist

## 2017-01-10 ENCOUNTER — Ambulatory Visit: Payer: Self-pay | Admitting: Cardiothoracic Surgery

## 2017-01-10 MED ORDER — ATORVASTATIN CALCIUM 80 MG PO TABS: 80.0000 mg | ORAL_TABLET | Freq: Every day | ORAL | 1 refills | Status: DC

## 2017-01-10 NOTE — Progress Notes (Signed)
Cardiac rehab referral faxed to [facility] in [place] per patient preference with supporting documentation.  Ave Filter, RN

## 2017-01-11 ENCOUNTER — Other Ambulatory Visit (HOSPITAL_COMMUNITY): Payer: Self-pay | Admitting: Pharmacist

## 2017-01-11 MED ORDER — FUROSEMIDE 40 MG PO TABS: 40.0000 mg | ORAL_TABLET | Freq: Every day | ORAL | 1 refills | Status: DC

## 2017-01-17 ENCOUNTER — Ambulatory Visit: Payer: Self-pay | Admitting: Cardiothoracic Surgery

## 2017-01-23 ENCOUNTER — Ambulatory Visit (INDEPENDENT_AMBULATORY_CARE_PROVIDER_SITE_OTHER): Payer: Self-pay | Admitting: Physician Assistant

## 2017-01-23 ENCOUNTER — Encounter: Payer: Self-pay | Admitting: Physician Assistant

## 2017-01-23 ENCOUNTER — Ambulatory Visit
Admission: RE | Admit: 2017-01-23 | Discharge: 2017-01-23 | Disposition: A | Discharge status: Home / Self Care | Payer: Medicare PPO | Source: Ambulatory Visit | Attending: Cardiothoracic Surgery | Admitting: Cardiothoracic Surgery

## 2017-01-23 ENCOUNTER — Other Ambulatory Visit: Payer: Self-pay | Admitting: *Deleted

## 2017-01-23 VITALS — BP 95/59 | HR 95 | Resp 16 | Ht 68.0 in | Wt 121.6 lb

## 2017-01-23 DIAGNOSIS — I2510 Atherosclerotic heart disease of native coronary artery without angina pectoris: Secondary | ICD-10-CM

## 2017-01-23 DIAGNOSIS — J90 Pleural effusion, not elsewhere classified: Secondary | ICD-10-CM

## 2017-01-23 DIAGNOSIS — Z951 Presence of aortocoronary bypass graft: Secondary | ICD-10-CM

## 2017-01-23 NOTE — Patient Instructions (Signed)
You may return to driving an automobile on Friday 01/26/2017 You are encouraged to enroll and participate in the outpatient cardiac rehab program beginning as soon as practical.as long as you are no longer requiring oral narcotic pain relievers during the daytime. It would be wise to start driving only short distances during the daylight and gradually increase from there as you feel comfortable.  You are encouraged to enroll and participate in the outpatient cardiac rehab program beginning as soon as practical once right thoracentesis has been done.  Continue to avoid any heavy lifting or strenuous use of your arms or shoulders for at least a total of three months from the time of surgery.  After two months you may gradually increase how much you lift or otherwise use your arms or chest as tolerated, with limits based upon whether or not activities lead to the return of significant discomfort.

## 2017-01-23 NOTE — Progress Notes (Signed)
[address]       [place] [address]             [phone]       CARDIAC SURGERY POSTOPERATIVE VISIT  Patient Name: Latoya Adams MRN: [id number] DOB: 08-17-1945  Subjective: Latoya Adams is a 71 y.o. female here for routine follow up s/p CABG x 4 on 12/08/2016 by Dr. Donata Clay. She had a NSTEMI and her LVEF is 20-25%. She has some shortness of breath with activity but denies any chest pain.  Past Medical History:  Diagnosis Date  . Alcohol abuse   . Hypertension    Prior to Admission medications   Medication Sig Start Date End Date Taking? Authorizing Provider  aspirin EC 325 MG EC tablet Take 1 tablet (325 mg total) by mouth daily. 12/15/16  Yes Barrett, Erin R, PA-C  atorvastatin (LIPITOR) 80 MG tablet Take 1 tablet (80 mg total) by mouth daily at 6 PM. 02/02/17  Yes Tillery, Mariam Dollar, PA-C  buPROPion (WELLBUTRIN XL) 300 MG 24 hr tablet Take 300 mg by mouth daily.   Yes [provider]  carvedilol (COREG) 3.125 MG tablet Take 1 tablet (3.125 mg total) by mouth 2 (two) times daily with a meal. 02/02/17  Yes Tillery, Mariam Dollar, PA-C  digoxin (LANOXIN) 0.125 MG tablet Take 1 tablet (0.125 mg total) by mouth daily. 02/02/17  Yes Graciella Freer, PA-C  furosemide (LASIX) 40 MG tablet Take 1 tablet (40 mg total) by mouth daily. 02/02/17  Yes Graciella Freer, PA-C  losartan (COZAAR) 25 MG tablet Take 0.5 tablets (12.5 mg total) by mouth daily. 02/02/17 05/03/17 Yes TilleryMariam Dollar, PA-C  mometasone-formoterol (DULERA) 200-5 MCG/ACT AERO Inhale 2 puffs into the lungs 2 (two) times daily. 12/14/16  Yes Barrett, Erin R, PA-C  potassium chloride SA (K-DUR,KLOR-CON) 20 MEQ tablet Take 3 tablets (60 mEq total) by mouth daily. 02/02/17  Yes Graciella Freer, PA-C  sertraline (ZOLOFT) 100 MG tablet Take 200 mg by mouth daily.   Yes [provider]  spironolactone (ALDACTONE) 25 MG tablet Take 1 tablet (25 mg total) by  mouth daily. 02/02/17  Yes Graciella Freer, PA-C  vitamin B-12 (CYANOCOBALAMIN) 100 MCG tablet Take 100 mcg by mouth daily.   Yes [provider]  traMADol (ULTRAM) 50 MG tablet Take 1-2 tablets (50-100 mg total) by mouth every 4 (four) hours as needed for moderate pain. Patient not taking: Reported on 01/23/2017 12/14/16   Barrett, Rae Roam, PA-C   Physical Exam:  Vitals:   01/23/17 1420  BP: (!) 95/59  Pulse: 95  Resp: 16  SpO2: 99%    GENERAL: Well-nourished, well-developed, in no acute distress CARDIOVASCULAR: Regular rate and rhythm. No murmur. No peripheral edema. RESPIRATORY: Respiratory effort is normal. Lungs clear to auscultation on left and diminished right base WOUNDS: Clean and dry.  Imaging Studies: CXR: CLINICAL DATA:  History CABG  EXAM: CHEST  2 VIEW  COMPARISON:  12/13/2016  FINDINGS: There is a moderate right pleural effusion. There is no focal parenchymal opacity. There is no left pleural effusion. There is no pneumothorax. The heart and mediastinal contours are unremarkable. There is evidence of prior CABG.  The osseous structures are unremarkable.  IMPRESSION: 1. Moderate right pleural effusion.   Electronically Signed   By: Elige Ko   On: 01/23/2017 14:12  Impression/Plan: She has developed a moderate to large right pleural effusion. Our office will arrange a right thoracentesis  in IR at [facility]. She has already seen Otilio Saber PA-C from the heart failure clinic on 01/08/2017. He continued her on Lasix 40 mg daily and started Losartan 12.5 mg daily. Her systolic BP is mostly in the 90's so I will not increase her diuretic at this time. Hopefully, the right pleural effusion does not recur, but if it does, she will need a right Pleur X, which I discussed with the patient. She was instructed that as long as she is not taking a narcotic for pain, which she states she is not, she may begin driving short distances  (i.e. 30 minutes or less during the day, starting Friday 01/26/2017) and gradually increase her frequency and duration as tolerates. She was instructed to continue with sternal precautions (i.e. No lifting more than 10 pounds for at least 2 more weeks) then gradually return to normal activities. Our office will contact cardiac rehab after her next visit so she can begin participation. She will return to see Dr. Donata Clay in 2 weeks with a PA/LAT CXR to re evaluate.   Doree Fudge, PA-C 01/23/2017 2:54 PM

## 2017-01-24 ENCOUNTER — Other Ambulatory Visit (HOSPITAL_COMMUNITY): Payer: Self-pay | Admitting: General Surgery

## 2017-01-24 ENCOUNTER — Encounter (HOSPITAL_COMMUNITY): Payer: Self-pay | Admitting: General Surgery

## 2017-01-24 ENCOUNTER — Other Ambulatory Visit: Payer: Self-pay | Admitting: Cardiothoracic Surgery

## 2017-01-24 ENCOUNTER — Ambulatory Visit (HOSPITAL_COMMUNITY)
Admission: RE | Admit: 2017-01-24 | Discharge: 2017-01-24 | Disposition: A | Discharge status: Home / Self Care | Payer: Medicare PPO | Source: Ambulatory Visit | Attending: General Surgery | Admitting: General Surgery

## 2017-01-24 ENCOUNTER — Ambulatory Visit (HOSPITAL_COMMUNITY)
Admission: RE | Admit: 2017-01-24 | Discharge: 2017-01-24 | Disposition: A | Discharge status: Home / Self Care | Payer: Medicare PPO | Source: Ambulatory Visit | Attending: Cardiothoracic Surgery | Admitting: Cardiothoracic Surgery

## 2017-01-24 DIAGNOSIS — J90 Pleural effusion, not elsewhere classified: Secondary | ICD-10-CM

## 2017-01-24 DIAGNOSIS — Z951 Presence of aortocoronary bypass graft: Secondary | ICD-10-CM | POA: Diagnosis not present

## 2017-01-24 HISTORY — PX: IR THORACENTESIS ASP PLEURAL SPACE W/IMG GUIDE: IMG5380

## 2017-01-24 MED ORDER — LIDOCAINE HCL (PF) 1 % IJ SOLN
INTRAMUSCULAR | Status: AC
  Filled 2017-01-24: qty 30

## 2017-01-24 MED ORDER — LIDOCAINE HCL (PF) 1 % IJ SOLN
INTRAMUSCULAR | Status: DC | PRN
  Administered 2017-01-24: 10 mL

## 2017-01-24 NOTE — Procedures (Signed)
Ultrasound-guided therapeutic right thoracentesis performed yielding 1.4 liters of amber colored fluid. No immediate complications. Follow-up chest x-ray pending.       Meda Dudzinski E 2:11 PM 01/24/2017

## 2017-01-29 ENCOUNTER — Ambulatory Visit (HOSPITAL_COMMUNITY)
Admission: RE | Admit: 2017-01-29 | Discharge: 2017-01-29 | Disposition: A | Discharge status: Home / Self Care | Payer: Medicare PPO | Source: Ambulatory Visit | Attending: Cardiology | Admitting: Cardiology

## 2017-01-29 VITALS — BP 98/56 | HR 85 | Wt 119.2 lb

## 2017-01-29 DIAGNOSIS — J449 Chronic obstructive pulmonary disease, unspecified: Secondary | ICD-10-CM | POA: Insufficient documentation

## 2017-01-29 DIAGNOSIS — Z951 Presence of aortocoronary bypass graft: Secondary | ICD-10-CM | POA: Insufficient documentation

## 2017-01-29 DIAGNOSIS — I5022 Chronic systolic (congestive) heart failure: Secondary | ICD-10-CM | POA: Diagnosis present

## 2017-01-29 DIAGNOSIS — E876 Hypokalemia: Secondary | ICD-10-CM | POA: Insufficient documentation

## 2017-01-29 DIAGNOSIS — Z720 Tobacco use: Secondary | ICD-10-CM | POA: Insufficient documentation

## 2017-01-29 DIAGNOSIS — D539 Nutritional anemia, unspecified: Secondary | ICD-10-CM | POA: Diagnosis not present

## 2017-01-29 DIAGNOSIS — I2510 Atherosclerotic heart disease of native coronary artery without angina pectoris: Secondary | ICD-10-CM | POA: Insufficient documentation

## 2017-01-29 LAB — BASIC METABOLIC PANEL
Anion gap: 8 (ref 5–15)
BUN: 8 mg/dL (ref 6–20)
CHLORIDE: 108 mmol/L (ref 101–111)
CO2: 24 mmol/L (ref 22–32)
Calcium: 8.4 mg/dL — ABNORMAL LOW (ref 8.9–10.3)
Creatinine, Ser: 0.68 mg/dL (ref 0.44–1.00)
GFR calc Af Amer: >60 mL/min (ref >60)
GFR calc non Af Amer: >60 mL/min (ref >60)
GLUCOSE: 109 mg/dL — AB (ref 65–99)
POTASSIUM: 4.1 mmol/L (ref 3.5–5.1)
Sodium: 140 mmol/L (ref 135–145)

## 2017-01-29 MED ORDER — CARVEDILOL 3.125 MG PO TABS: 6.2500 mg | ORAL_TABLET | Freq: Two times a day (BID) | ORAL | 3 refills | Status: DC

## 2017-01-29 NOTE — Progress Notes (Signed)
HF MD: [name]  HPI:  Latoya Adams is a 71 y.o. Caucasian female with history of CAD s/ CABG x 4 12/08/2016, chronic systolic CHF cMRI EF 24% with only small apex scar, Tobacco use, COPD, and chronic anemia.   Pt admitted 12/03/2016 with NSTEMI, Troponin peak 8.5. Taken for urgent cath as below which showed severe multivessel disease and depressed EF. Echo with LVEF ~ 25%. cMRI viability study only showed small scar. Taken for CABG on 12/08/16. Pt recovery was relatively unremarkable. She tolerated adjustment of her HF medications and was sent to SNF on discharge. Discharge weight was 134 lbs.   She presents today for pharmacist-led HF medication titration. At last HF clinic visit on 01/08/17, she was started on losartan 12.5 mg daily as she had been off of Entresto 24-26 mg BID for at least 2 weeks 2/2 not having refills and her BP at that visit was soft. Has been feeling great overall except for a dull headache she gets every so often. Continues to use rollator when she goes out in public and sometimes has to sit and have family push her. On 01/24/17, had thoracentesis yielding 1.4L of fluid. Since then she feels less dyspneic. She is having some "burning" pain at the bottom of her CABG scar which is stable since surgery. Weight at home is now closer to 116-117 lb since thoracentesis. She is slowly increasing her activity. Has been referred for cardiac rehab in [facility] but has not yet started.     . Shortness of breath/dyspnea on exertion? Yes - stable . Orthopnea/PND? no . Edema? no . Lightheadedness/dizziness? no . Daily weights at home? Yes - 116-117 lb  . Blood pressure/heart rate monitoring at home? no . Following low-sodium/fluid-restricted diet? yes  HF Medications: Carvedilol 3.125 mg PO BID Digoxin 0.125 mg PO daily Furosemide 40 mg PO daily Losartan 12.5 mg PO daily KCl 60 mEq PO daily Spironolactone 25 mg PO daily   Has the patient been experiencing any side effects to the  medications prescribed?  no  Does the patient have any problems obtaining medications due to transportation or finances?   No - [employer]  Understanding of regimen: fair Understanding of indications: fair Potential of compliance: good Patient understands to avoid NSAIDs. Patient understands to avoid decongestants.    Pertinent Lab Values: . 01/29/17: Serum creatinine 0.68, BUN 8, Potassium 4.1, Sodium 140 . 01/08/17: Digoxin 0.5  . 12/09/16: Magnesium 2.3  Vital Signs: . Weight: 119.2 lb (dry weight: 120 lb) . Blood pressure: 98/56 mmHg  . Heart rate: 85 bpm   Assessment: 1. Chronicsystolic CHF (EF 75-88%), due to CAD. NYHA class II-IIIsymptoms.  - Volume status stable  - Increase carvedilol to 6.25 mg BID cautiously with soft BP  - Continue digoxin 0.125 mg daily, furosemide 40 mg daily, losartan 12.5 mg daily, KCl 60 meq daily and spironolactone 25 mg daily  - Uptitration of medications will be difficult 2/2 her soft BP - Basic disease state pathophysiology, medication indication, mechanism and side effects reviewed at length with patient and he verbalized understanding 2. NSTEMI/CAD s/p CABG x 4 (12/08/16)  - F/u with Dr. Donata Clay next week and Dr. Excell Seltzer in October   - Continue on ASA, statin, BB, ARB  - Suspect that her EF will improve now that she is s/p CABG  - Referred for cardiac rehab 3. PAD  - Stable  - Continue on ASA, statin 4. Tobacco use/COPD  - PFTs show severe COPD on Dulera   -  She remains abstinent from cigarettes 5. Hypokalemia  - BMET today  Plan: 1) Medication changes: Based on clinical presentation, vital signs and recent labs will increase carvedilol to 6.25 mg BID 2) Labs: BMET today  3) Follow-up: Dr. [name] on 03/01/17   Tyler Deis. Bonnye Fava, PharmD, BCPS, CPP Clinical Pharmacist Pager: [phone] Phone: [phone] 01/29/2017 2:42 PM

## 2017-01-29 NOTE — Patient Instructions (Addendum)
It was great to see you today!  Please INCREASE your carvedilol to 6.25 mg (#2 of your 3.125 mg tablets) TWICE DAILY.  Labs today. We will call you with any abnormalities.  Please keep your appointment with Dr. [name] on 03/01/17.

## 2017-02-06 ENCOUNTER — Other Ambulatory Visit: Payer: Self-pay | Admitting: Cardiothoracic Surgery

## 2017-02-06 DIAGNOSIS — Z951 Presence of aortocoronary bypass graft: Secondary | ICD-10-CM

## 2017-02-07 ENCOUNTER — Ambulatory Visit
Admission: RE | Admit: 2017-02-07 | Discharge: 2017-02-07 | Disposition: A | Discharge status: Home / Self Care | Payer: Medicare PPO | Source: Ambulatory Visit | Attending: Cardiothoracic Surgery | Admitting: Cardiothoracic Surgery

## 2017-02-07 ENCOUNTER — Ambulatory Visit (INDEPENDENT_AMBULATORY_CARE_PROVIDER_SITE_OTHER): Payer: Self-pay | Admitting: Cardiothoracic Surgery

## 2017-02-07 ENCOUNTER — Encounter: Payer: Self-pay | Admitting: Cardiothoracic Surgery

## 2017-02-07 VITALS — BP 111/63 | HR 96 | Resp 16 | Ht 68.0 in | Wt 119.2 lb

## 2017-02-07 DIAGNOSIS — Z951 Presence of aortocoronary bypass graft: Secondary | ICD-10-CM

## 2017-02-07 DIAGNOSIS — I2510 Atherosclerotic heart disease of native coronary artery without angina pectoris: Secondary | ICD-10-CM

## 2017-02-07 NOTE — Progress Notes (Signed)
PCP is System, Pcp Not In Referring Provider is Tonny Bollman, MD  Chief Complaint  Patient presents with  . Routine Post Op    2 wk f/u with CXR s/p ABG 12/18/16    XUX:YBFXOVA turns for follow-up after urgent CABG for ischemic cardiomyopathy with EF 20% after an acute MI The patient is had a recurrent right pleural effusion from right heart failure and elevated CVP 1.4 L was removed by interventional radiologist 2 weeks ago She has had recurrent shortness of breath and chest x-ray today shows a recurrent large right pleural effusion 1.1 L was removed in the office today with right thoracentesis She has mild pedal edema She is followed as advanced heart failure clinic Her blood pressure is soft and  entresto  stopped, diuretics have continued Past Medical History:  Diagnosis Date  . Alcohol abuse   . Hypertension     Past Surgical History:  Procedure Laterality Date  . CORONARY ARTERY BYPASS GRAFT N/A 12/08/2016   Procedure: CORONARY ARTERY BYPASS GRAFTING (CABG)x4 using left internal mammary artery and bilateral greater saphenous veins harvested endoscopically;  Surgeon: Kerin Perna, MD;  Location: [facility] OR;  Service: Open Heart Surgery;  Laterality: N/A;  . dental implants    . IR THORACENTESIS ASP PLEURAL SPACE W/IMG GUIDE  01/24/2017  . LEFT HEART CATH AND CORONARY ANGIOGRAPHY N/A 12/03/2016   Procedure: Left Heart Cath and Coronary Angiography;  Surgeon: Tonny Bollman, MD;  Location: [facility] INVASIVE CV LAB;  Service: Cardiovascular;  Laterality: N/A;  . RIGHT HEART CATH N/A 12/05/2016   Procedure: Right Heart Cath;  Surgeon: Dolores Patty, MD;  Location: [facility] INVASIVE CV LAB;  Service: Cardiovascular;  Laterality: N/A;  . TEE WITHOUT CARDIOVERSION N/A 12/08/2016   Procedure: TRANSESOPHAGEAL ECHOCARDIOGRAM (TEE);  Surgeon: Donata Clay, Theron Arista, MD;  Location: [facility] OR;  Service: Open Heart Surgery;  Laterality: N/A;  . TUBAL LIGATION      Family History  Problem Relation Age of Onset   . Heart attack Neg Hx     Social History Social History  Substance Use Topics  . Smoking status: Former Smoker    Types: Cigarettes    Quit date: 12/03/2016  . Smokeless tobacco: Never Used  . Alcohol use No    Current Outpatient Prescriptions  Medication Sig Dispense Refill  . aspirin EC 325 MG EC tablet Take 1 tablet (325 mg total) by mouth daily. 30 tablet 0  . atorvastatin (LIPITOR) 80 MG tablet Take 1 tablet (80 mg total) by mouth daily at 6 PM. 30 tablet 1  . buPROPion (WELLBUTRIN XL) 300 MG 24 hr tablet Take 300 mg by mouth daily.    . carvedilol (COREG) 3.125 MG tablet Take 2 tablets (6.25 mg total) by mouth 2 (two) times daily with a meal. 360 tablet 3  . digoxin (LANOXIN) 0.125 MG tablet Take 1 tablet (0.125 mg total) by mouth daily. 90 tablet 3  . furosemide (LASIX) 40 MG tablet Take 1 tablet (40 mg total) by mouth daily. 30 tablet 1  . losartan (COZAAR) 25 MG tablet Take 0.5 tablets (12.5 mg total) by mouth daily. 45 tablet 3  . mometasone-formoterol (DULERA) 200-5 MCG/ACT AERO Inhale 2 puffs into the lungs 2 (two) times daily.    . potassium chloride SA (K-DUR,KLOR-CON) 20 MEQ tablet Take 3 tablets (60 mEq total) by mouth daily. 270 tablet 3  . sertraline (ZOLOFT) 100 MG tablet Take 200 mg by mouth daily.    Marland Kitchen spironolactone (ALDACTONE) 25 MG  tablet Take 1 tablet (25 mg total) by mouth daily. 90 tablet 3   No current facility-administered medications for this visit.     Allergies  Allergen Reactions  . Codeine Hives    Review of Systems  Slight improvement in strength and appetite however shortness of breath is limited her activities  BP 111/63 (BP Location: Left Arm, Patient Position: Sitting, Cuff Size: Normal)   Pulse 96   Resp 16   Ht 5\' 8"  (1.727 m)   Wt 119 lb 3.2 oz (54.1 kg)   SpO2 96% Comment: ON RA  BMI 18.12 kg/m  Physical Exam      Exam    General- alert and comfortable   Lungs- diminished breath sounds on the right, left lung clear    Chest-sternal incision clean and dry stable    Cor- regular rate and rhythm, no murmur , gallop   Abdomen- soft, non-tender   Extremities - warm, non-tender, minimal edema   Neuro- oriented, appropriate, no focal weakness   Diagnostic Tests: Chest x-ray large recurrent right pleural effusion  Impression: Ischemic cardiomyopathy postop right heart dysfunction and recurrent right pleural effusion. Thoracentesis removed 1.1 L in office today  Plan:She'll return with x-ray in 3 weeks. If she has recurrent effusion after 2 thoracentesis procedures then right Pleurx catheter will be recommended. I stressed the importance of improved nutrition with protein in her diet, low sodium intake and trying to walk 20 minutes daily   Mikey Bussing, MD [facility] [phone]

## 2017-02-27 ENCOUNTER — Other Ambulatory Visit: Payer: Self-pay | Admitting: Cardiothoracic Surgery

## 2017-02-27 DIAGNOSIS — Z951 Presence of aortocoronary bypass graft: Secondary | ICD-10-CM

## 2017-02-28 ENCOUNTER — Encounter: Payer: Self-pay | Admitting: Cardiothoracic Surgery

## 2017-02-28 ENCOUNTER — Ambulatory Visit (INDEPENDENT_AMBULATORY_CARE_PROVIDER_SITE_OTHER): Payer: Self-pay | Admitting: Cardiothoracic Surgery

## 2017-02-28 ENCOUNTER — Telehealth: Payer: Self-pay | Admitting: Cardiovascular Disease

## 2017-02-28 ENCOUNTER — Ambulatory Visit
Admission: RE | Admit: 2017-02-28 | Discharge: 2017-02-28 | Disposition: A | Discharge status: Home / Self Care | Payer: Medicare PPO | Source: Ambulatory Visit | Attending: Cardiothoracic Surgery | Admitting: Cardiothoracic Surgery

## 2017-02-28 VITALS — BP 113/69 | HR 94 | Resp 16 | Ht 68.0 in | Wt 119.0 lb

## 2017-02-28 DIAGNOSIS — Z951 Presence of aortocoronary bypass graft: Secondary | ICD-10-CM

## 2017-02-28 DIAGNOSIS — J90 Pleural effusion, not elsewhere classified: Secondary | ICD-10-CM

## 2017-02-28 DIAGNOSIS — I2510 Atherosclerotic heart disease of native coronary artery without angina pectoris: Secondary | ICD-10-CM

## 2017-02-28 NOTE — Telephone Encounter (Signed)
New Message     Pt wants to know if she needs to keep the appt on 03/05/17 since she is seeing Dr Donata Clay and Dr Teressa Lower

## 2017-02-28 NOTE — Progress Notes (Signed)
PCP is System, Pcp Not In Referring Provider is Tonny Bollman, MD  Chief Complaint  Patient presents with  . Routine Post Op    3 wk f/u with a CXR...s/p CABG X 4 12/08/16..to reassess right pl effusion after 2 thoracentesis    HPI: Scheduled follow-up 1 month after multivessel CABG after completed infarction. The patient has ischemic cardiomyopathy with EF of 20% and is followed at the heart failure clinic. She has had a recurrent right pleural effusion and has had a right thoracentesis 2. She presents with chest x-ray today for evaluation of the right pleural effusion. Since I removed the last thoracentesis fluid the effusion has partially recurred. It is not as significant as the original effusion but is still mild-moderate. She feels she cannot take a deep breath but she denies shortness of breath or difficulty with normal activities. She's had some mild ankle edema as well. She has normal renal function and her diuretic therapy is helping however I feel she would probably benefit from a right Pleurx catheter to manage her weight gain and fluid retention from ischemic cardiomyopathy. She'll be seeing her heart failure cardiologist in clinic tomorrow . We'll proceed with placement right Pleurx catheter upon recommendation of her cardiologist  unless her diuretic regimen can be  increased. Past Medical History:  Diagnosis Date  . Alcohol abuse   . Hypertension     Past Surgical History:  Procedure Laterality Date  . CORONARY ARTERY BYPASS GRAFT N/A 12/08/2016   Procedure: CORONARY ARTERY BYPASS GRAFTING (CABG)x4 using left internal mammary artery and bilateral greater saphenous veins harvested endoscopically;  Surgeon: Kerin Perna, MD;  Location: [facility] OR;  Service: Open Heart Surgery;  Laterality: N/A;  . dental implants    . IR THORACENTESIS ASP PLEURAL SPACE W/IMG GUIDE  01/24/2017  . LEFT HEART CATH AND CORONARY ANGIOGRAPHY N/A 12/03/2016   Procedure: Left Heart Cath and Coronary  Angiography;  Surgeon: Tonny Bollman, MD;  Location: [facility] INVASIVE CV LAB;  Service: Cardiovascular;  Laterality: N/A;  . RIGHT HEART CATH N/A 12/05/2016   Procedure: Right Heart Cath;  Surgeon: Dolores Patty, MD;  Location: [facility] INVASIVE CV LAB;  Service: Cardiovascular;  Laterality: N/A;  . TEE WITHOUT CARDIOVERSION N/A 12/08/2016   Procedure: TRANSESOPHAGEAL ECHOCARDIOGRAM (TEE);  Surgeon: Donata Clay, Theron Arista, MD;  Location: [facility] OR;  Service: Open Heart Surgery;  Laterality: N/A;  . TUBAL LIGATION      Family History  Problem Relation Age of Onset  . Heart attack Neg Hx     Social History Social History  Substance Use Topics  . Smoking status: Former Smoker    Types: Cigarettes    Quit date: 12/03/2016  . Smokeless tobacco: Never Used  . Alcohol use No    Current Outpatient Prescriptions  Medication Sig Dispense Refill  . atorvastatin (LIPITOR) 80 MG tablet Take 1 tablet (80 mg total) by mouth daily at 6 PM. 30 tablet 1  . buPROPion (WELLBUTRIN XL) 300 MG 24 hr tablet Take 300 mg by mouth daily.    . carvedilol (COREG) 3.125 MG tablet Take 2 tablets (6.25 mg total) by mouth 2 (two) times daily with a meal. 360 tablet 3  . digoxin (LANOXIN) 0.125 MG tablet Take 1 tablet (0.125 mg total) by mouth daily. 90 tablet 3  . furosemide (LASIX) 40 MG tablet Take 1 tablet (40 mg total) by mouth daily. 30 tablet 1  . losartan (COZAAR) 25 MG tablet Take 0.5 tablets (12.5 mg total) by mouth  daily. 45 tablet 3  . mometasone-formoterol (DULERA) 200-5 MCG/ACT AERO Inhale 2 puffs into the lungs 2 (two) times daily.    . potassium chloride SA (K-DUR,KLOR-CON) 20 MEQ tablet Take 3 tablets (60 mEq total) by mouth daily. 270 tablet 3  . sertraline (ZOLOFT) 100 MG tablet Take 200 mg by mouth daily.    Marland Kitchen spironolactone (ALDACTONE) 25 MG tablet Take 1 tablet (25 mg total) by mouth daily. 90 tablet 3  . aspirin EC 325 MG EC tablet Take 1 tablet (325 mg total) by mouth daily. 30 tablet 0   No current  facility-administered medications for this visit.     Allergies  Allergen Reactions  . Codeine Hives    Review of Systems  Concern about weight gain Recurrent right pleural effusion on chest x-ray mild-moderate  BP 113/69 (BP Location: Left Arm, Patient Position: Sitting, Cuff Size: Large)   Pulse 94   Resp 16   Ht  (1.727 m)   Wt 119 lb (54 kg)   SpO2 97% Comment: ON RA  BMI 18.09 kg/m  Physical Exam Alert and comfortable Diminished breath sounds right base Appears to be in sinus rhythm without murmur Minimal ankle edema  Diagnostic Tests: Chest x-ray images personally reviewed and discussed with patient  Impression: Postop recurrent right pleural effusion status post thoracentesis 2 We'll proceed with Pleurx catheter if felt to be indicated by her heart failure cardiologist.  Plan: Return for follow-up in 3 weeks with chest x-ray  Mikey Bussing, MD [facility] [phone]

## 2017-03-01 ENCOUNTER — Ambulatory Visit (HOSPITAL_COMMUNITY)
Admission: RE | Admit: 2017-03-01 | Discharge: 2017-03-01 | Disposition: A | Discharge status: Home / Self Care | Payer: Medicare PPO | Source: Ambulatory Visit | Attending: Internal Medicine | Admitting: Internal Medicine

## 2017-03-01 ENCOUNTER — Encounter (HOSPITAL_COMMUNITY): Payer: Self-pay | Admitting: Internal Medicine

## 2017-03-01 VITALS — BP 110/68 | HR 81 | Wt 122.8 lb

## 2017-03-01 DIAGNOSIS — I214 Non-ST elevation (NSTEMI) myocardial infarction: Secondary | ICD-10-CM | POA: Insufficient documentation

## 2017-03-01 DIAGNOSIS — D649 Anemia, unspecified: Secondary | ICD-10-CM | POA: Insufficient documentation

## 2017-03-01 DIAGNOSIS — Z951 Presence of aortocoronary bypass graft: Secondary | ICD-10-CM | POA: Diagnosis not present

## 2017-03-01 DIAGNOSIS — I2510 Atherosclerotic heart disease of native coronary artery without angina pectoris: Secondary | ICD-10-CM | POA: Insufficient documentation

## 2017-03-01 DIAGNOSIS — J90 Pleural effusion, not elsewhere classified: Secondary | ICD-10-CM

## 2017-03-01 DIAGNOSIS — J449 Chronic obstructive pulmonary disease, unspecified: Secondary | ICD-10-CM | POA: Insufficient documentation

## 2017-03-01 DIAGNOSIS — I739 Peripheral vascular disease, unspecified: Secondary | ICD-10-CM | POA: Insufficient documentation

## 2017-03-01 DIAGNOSIS — I5022 Chronic systolic (congestive) heart failure: Secondary | ICD-10-CM | POA: Insufficient documentation

## 2017-03-01 DIAGNOSIS — I252 Old myocardial infarction: Secondary | ICD-10-CM | POA: Insufficient documentation

## 2017-03-01 DIAGNOSIS — I110 Hypertensive heart disease with heart failure: Secondary | ICD-10-CM | POA: Insufficient documentation

## 2017-03-01 DIAGNOSIS — Z720 Tobacco use: Secondary | ICD-10-CM | POA: Diagnosis not present

## 2017-03-01 DIAGNOSIS — Z7982 Long term (current) use of aspirin: Secondary | ICD-10-CM | POA: Diagnosis not present

## 2017-03-01 DIAGNOSIS — F1010 Alcohol abuse, uncomplicated: Secondary | ICD-10-CM | POA: Insufficient documentation

## 2017-03-01 LAB — COMPREHENSIVE METABOLIC PANEL
ALBUMIN: 3.7 g/dL (ref 3.5–5.0)
ALK PHOS: 89 U/L (ref 38–126)
ALT: 16 U/L (ref 14–54)
ANION GAP: 9 (ref 5–15)
AST: 20 U/L (ref 15–41)
BUN: 9 mg/dL (ref 6–20)
CHLORIDE: 103 mmol/L (ref 101–111)
CO2: 25 mmol/L (ref 22–32)
Calcium: 8.7 mg/dL — ABNORMAL LOW (ref 8.9–10.3)
Creatinine, Ser: 0.68 mg/dL (ref 0.44–1.00)
GFR calc non Af Amer: >60 mL/min (ref >60)
Glucose, Bld: 88 mg/dL (ref 65–99)
POTASSIUM: 4.2 mmol/L (ref 3.5–5.1)
Sodium: 137 mmol/L (ref 135–145)
Total Bilirubin: 0.5 mg/dL (ref 0.3–1.2)
Total Protein: 6.6 g/dL (ref 6.5–8.1)

## 2017-03-01 LAB — CBC
HEMATOCRIT: 33.9 % — AB (ref 36.0–46.0)
Hemoglobin: 10.9 g/dL — ABNORMAL LOW (ref 12.0–15.0)
MCH: 27.5 pg (ref 26.0–34.0)
MCHC: 32.2 g/dL (ref 30.0–36.0)
MCV: 85.4 fL (ref 78.0–100.0)
Platelets: 268 10*3/uL (ref 150–400)
RBC: 3.97 MIL/uL (ref 3.87–5.11)
RDW: 17.7 % — ABNORMAL HIGH (ref 11.5–15.5)
WBC: 5.5 10*3/uL (ref 4.0–10.5)

## 2017-03-01 LAB — DIGOXIN LEVEL: Digoxin Level: 0.3 ng/mL — ABNORMAL LOW (ref 0.8–2.0)

## 2017-03-01 NOTE — Progress Notes (Signed)
Advanced Heart Failure Clinic Note   Primary Care: Dr. Lois Huxley in [facility] Primary Cardiologist:  HPI:  Latoya Adams is a 71 y.o. female with history of CAD s/ CABG x 4 12/08/2016, chronic systolic CHF cMRI EF 24% with only small apex scar, Tobacco use, COPD, and chronic anemia.   Pt admitted 12/03/2016 with NSTEMI, Troponin peak 8.5. Taken for urgent cath as below which showed severe multivessel disease and depressed EF. Echo with LVEF ~ 25%. cMRI viability study only showed small scar. Taken for CABG on 12/08/16. Pt recovery was relatively unremarkable. She tolerated adjustment of her HF medications and was sent to SNF on discharge. Discharge weight was 134 lbs.   Has struggled with R pleural effusion. Has been drained 2x.    Overall feeling ok. Able to do all ADLs and go to grocery store without too much problem but does have to use rolling walker at times. No orthopnea or PND. No edema. Weight stable 118-122. SBP 90-low 100s.   Saw Dr. Donata Clay yesterday and found to have recurrent moderate R pleural effusion. He has suggested Pleurex cath    Review of systems complete and found to be negative unless listed in HPI.    Past Medical History:  Diagnosis Date  . Alcohol abuse   . Hypertension     Current Outpatient Prescriptions  Medication Sig Dispense Refill  . aspirin EC 325 MG EC tablet Take 1 tablet (325 mg total) by mouth daily. 30 tablet 0  . atorvastatin (LIPITOR) 80 MG tablet Take 1 tablet (80 mg total) by mouth daily at 6 PM. 30 tablet 1  . buPROPion (WELLBUTRIN XL) 300 MG 24 hr tablet Take 300 mg by mouth daily.    . carvedilol (COREG) 3.125 MG tablet Take 2 tablets (6.25 mg total) by mouth 2 (two) times daily with a meal. 360 tablet 3  . digoxin (LANOXIN) 0.125 MG tablet Take 1 tablet (0.125 mg total) by mouth daily. 90 tablet 3  . furosemide (LASIX) 40 MG tablet Take 1 tablet (40 mg total) by mouth daily. 30 tablet 1  . losartan (COZAAR) 25 MG tablet Take 0.5  tablets (12.5 mg total) by mouth daily. 45 tablet 3  . mometasone-formoterol (DULERA) 200-5 MCG/ACT AERO Inhale 2 puffs into the lungs 2 (two) times daily.    . potassium chloride SA (K-DUR,KLOR-CON) 20 MEQ tablet Take 3 tablets (60 mEq total) by mouth daily. 270 tablet 3  . sertraline (ZOLOFT) 100 MG tablet Take 200 mg by mouth daily.    Marland Kitchen spironolactone (ALDACTONE) 25 MG tablet Take 1 tablet (25 mg total) by mouth daily. 90 tablet 3   No current facility-administered medications for this encounter.    Allergies  Allergen Reactions  . Codeine Hives    Social History   Social History  . Marital status: Single    Spouse name: N/A  . Number of children: N/A  . Years of education: N/A   Occupational History  . Not on file.   Social History Main Topics  . Smoking status: Former Smoker    Types: Cigarettes    Quit date: 12/03/2016  . Smokeless tobacco: Never Used  . Alcohol use No  . Drug use: No  . Sexual activity: Not on file   Other Topics Concern  . Not on file   Social History Narrative  . No narrative on file      Family History  Problem Relation Age of Onset  . Heart attack Neg  Hx     Vitals:   03/01/17 1442  BP: 110/68  Pulse: 81  SpO2: 95%  Weight: 122 lb 12.8 oz (55.7 kg)   Wt Readings from Last 3 Encounters:  03/01/17 122 lb 12.8 oz (55.7 kg)  02/28/17 119 lb (54 kg)  02/07/17 119 lb 3.2 oz (54.1 kg)   PHYSICAL EXAM: General:  Thin. No resp difficulty HEENT: normal Neck: supple. no JVD. Carotids 2+ bilat; no bruits. No lymphadenopathy or thryomegaly appreciated. Cor: PMI nondisplaced. Regular rate & rhythm. No rubs, gallops or murmurs. Lungs: clear decreased BS 1/3 up on right Abdomen: soft, nontender, nondistended. No hepatosplenomegaly. No bruits or masses. Good bowel sounds. Extremities: no cyanosis, clubbing, rash, edema Neuro: alert & orientedx3, cranial nerves grossly intact. moves all 4 extremities w/o difficulty. Affect  pleasant  ASSESSMENT & PLAN:  1. Chronic systolic HF - Pre-op echo (7/18) EF 20-25% with normal RV - Pre-op cMRI (7/18) EF 24% with only small scar in apex. RHC with normal CO and no PAH - s/p CABG x 4. 12/08/16 - Improving slowly NYHA II-III - Overall volume status stable on exam.  - Continue dig 0.125 mg daily. - Continue carvedilol 3.125 mg twice a day.  - Continue lasix 40 mg daily. Can take extra as needed - Continue losartan 12.5 qhs (failed Entresto due to low BP) - Continue spiro daily.  - Check labs today  2. NSTEMI/CAD -- Critical pLAD disease with ulcerated plaque at bifurcation with D1 as well as RPDA 90% lesion.  RHC ok. PFTs with severe COPD. Carotid u/s ok.  -- S/P CABG x4 on 12/08/16 . On ASA and statin.  --Suspect that her EF will improve now that she is s/p CABG.  --No s/s ischemia --Refer to cardiac rehab at [facility]  3. Recurrent R pleural effusion --CXR reviewed with her and her son personally.  --Agree with Pleurex catheter. Doubt we can improve with increasing diuretics as she doesn't have fluid anywhere else currently.  --D/w Dr. Donata Clay  4. PAD -- Stable.   5. Tobacco use/COPD -- PFTs show severe COPD.  - She remains abstinent from cigarettes.    Arvilla Meres, MD 03/01/17

## 2017-03-01 NOTE — Patient Instructions (Signed)
Labs drawn today (if we do not call you, then your lab work was stable)   Your physician has requested that you have an echocardiogram. Echocardiography is a painless test that uses sound waves to create images of your heart. It provides your doctor with information about the size and shape of your heart and how well your heart's chambers and valves are working. This procedure takes approximately one hour. There are no restrictions for this procedure.  Your physician recommends that you schedule a follow-up appointment in: 4-6 weeks with and echocardiogram

## 2017-03-02 ENCOUNTER — Other Ambulatory Visit: Payer: Self-pay | Admitting: *Deleted

## 2017-03-02 DIAGNOSIS — J90 Pleural effusion, not elsewhere classified: Secondary | ICD-10-CM

## 2017-03-02 NOTE — Progress Notes (Signed)
Pt referel to cardiac rehab was sent to [facility] cardiac rehab with Bensimhon, MD last note and EKG. It was faxed on 03/02/17 at 0922 am.

## 2017-03-02 NOTE — Addendum Note (Signed)
Encounter addended by: Teresa Coombs, RN on: 03/02/2017 11:38 AM<BR>    Actions taken: Sign clinical note

## 2017-03-02 NOTE — Progress Notes (Signed)
Pt referrel

## 2017-03-02 NOTE — Telephone Encounter (Signed)
Left message to call back  

## 2017-03-02 NOTE — Addendum Note (Signed)
Encounter addended by: Teresa Coombs, RN on: 03/02/2017 11:41 AM<BR>    Actions taken: Sign clinical note

## 2017-03-05 ENCOUNTER — Ambulatory Visit: Payer: Medicare PPO | Admitting: Cardiovascular Disease

## 2017-03-05 ENCOUNTER — Encounter (HOSPITAL_COMMUNITY): Payer: Self-pay | Admitting: *Deleted

## 2017-03-05 NOTE — Progress Notes (Signed)
Anesthesia Chart Review:  Pt is a same day work up.   Pt is a 71 year old female scheduled for insertion of R pleural drainage catheter on 03/06/2017 with Kerin Perna, MD  - PCP is Lois Huxley, MD - Cardiologist is Arvilla Meres, MD  PMH includes:  CAD (s/p CABG x4 12/08/16), chronic systolic HF, HTN, alcohol abuse. Former smoker (quit 12/03/16). BMI 19  Medications include: ASA 325 mg, Lipitor, carvedilol, digitoxin, Lasix, losartan, dulera, potassium, spironolactone  Labs will be obtained DOS  CXR 02/28/17:  - Interval decrease in the volume of pleural fluid on the right. There is no pulmonary vascular congestion or pulmonary edema. Previous CABG. - Thoracic aortic atherosclerosis.  EKG 01/08/17: NSR. Possible LA enlargement. LVH with repolarization abnormality  TEE 12/08/16:   Left ventricle: Cavity is moderately dilated. Thin-walled ventricle. LV systolic function is <20%. Wall motion is abnormal. Anteroseptal wall motion is dyskinetic.  Septum: No Patent Foramen Ovale present.  Left atrium: Patent foramen ovale not present.  Aortic valve: Mild regurgitation.  Mitral valve: Non-specific thickening, leaflets appear myxomatous and dilated mitral annulus. Moderate leaflet thickening is present. Moderate regurgitation. There is mild prolapse of the anterior mitral leaflet.  Tricuspid valve: Trace regurgitation.  Pericardium: Pericardial thickening present.  Carotid duplex 12/07/16: No significant extracranial carotid artery stenosis demonstrated, 1-39%.   Right cardiac cath 12/05/16:  - Findings: RA = 1 RV = 30/2 PA = 33/4 (21) PCW = 11 Fick cardiac output/index = 4.4/2.6 PVR = 2.6 WU Ao sat = 90% PA sat = 53%, 55% - Assessment: 1. Normal hemodynamics 2. Mild resting hypoxemia 3. Stop lasix  No Left cardiac cath since 12/04/16 (done prior to CABG).  If labs acceptable DOS, I anticipate pt can proceed as scheduled.   Rica Mast, FNP-BC [facility] Short Stay Surgical  Center/Anesthesiology Phone: [phone] 03/05/2017 12:06 PM

## 2017-03-06 ENCOUNTER — Ambulatory Visit (HOSPITAL_COMMUNITY): Payer: Medicare PPO

## 2017-03-06 ENCOUNTER — Encounter (HOSPITAL_COMMUNITY)
Admission: RE | Disposition: A | Discharge status: Home / Self Care | Payer: Self-pay | Source: Ambulatory Visit | Attending: Cardiothoracic Surgery

## 2017-03-06 ENCOUNTER — Ambulatory Visit (HOSPITAL_COMMUNITY): Payer: Medicare PPO | Admitting: Emergency Medicine

## 2017-03-06 ENCOUNTER — Ambulatory Visit (HOSPITAL_COMMUNITY)
Admission: RE | Admit: 2017-03-06 | Discharge: 2017-03-06 | Disposition: A | Discharge status: Home / Self Care | Payer: Medicare PPO | Source: Ambulatory Visit | Attending: Cardiothoracic Surgery | Admitting: Cardiothoracic Surgery

## 2017-03-06 DIAGNOSIS — I255 Ischemic cardiomyopathy: Secondary | ICD-10-CM | POA: Diagnosis not present

## 2017-03-06 DIAGNOSIS — Z87891 Personal history of nicotine dependence: Secondary | ICD-10-CM | POA: Insufficient documentation

## 2017-03-06 DIAGNOSIS — Z888 Allergy status to other drugs, medicaments and biological substances status: Secondary | ICD-10-CM | POA: Insufficient documentation

## 2017-03-06 DIAGNOSIS — Z951 Presence of aortocoronary bypass graft: Secondary | ICD-10-CM | POA: Diagnosis not present

## 2017-03-06 DIAGNOSIS — J90 Pleural effusion, not elsewhere classified: Secondary | ICD-10-CM | POA: Diagnosis not present

## 2017-03-06 DIAGNOSIS — Z79899 Other long term (current) drug therapy: Secondary | ICD-10-CM | POA: Insufficient documentation

## 2017-03-06 DIAGNOSIS — Z7982 Long term (current) use of aspirin: Secondary | ICD-10-CM | POA: Insufficient documentation

## 2017-03-06 DIAGNOSIS — F1010 Alcohol abuse, uncomplicated: Secondary | ICD-10-CM | POA: Insufficient documentation

## 2017-03-06 DIAGNOSIS — I10 Essential (primary) hypertension: Secondary | ICD-10-CM | POA: Insufficient documentation

## 2017-03-06 DIAGNOSIS — Z01818 Encounter for other preprocedural examination: Secondary | ICD-10-CM

## 2017-03-06 DIAGNOSIS — Z419 Encounter for procedure for purposes other than remedying health state, unspecified: Secondary | ICD-10-CM

## 2017-03-06 HISTORY — DX: Personal history of other medical treatment: Z92.89

## 2017-03-06 HISTORY — DX: Anxiety disorder, unspecified: F41.9

## 2017-03-06 HISTORY — PX: CHEST TUBE INSERTION: SHX231

## 2017-03-06 HISTORY — DX: Major depressive disorder, single episode, unspecified: F32.9

## 2017-03-06 HISTORY — DX: Dyspnea, unspecified: R06.00

## 2017-03-06 HISTORY — DX: Depression, unspecified: F32.A

## 2017-03-06 HISTORY — DX: Acute myocardial infarction, unspecified: I21.9

## 2017-03-06 LAB — CBC
HCT: 33.5 % — ABNORMAL LOW (ref 36.0–46.0)
Hemoglobin: 10.5 g/dL — ABNORMAL LOW (ref 12.0–15.0)
MCH: 26.9 pg (ref 26.0–34.0)
MCHC: 31.3 g/dL (ref 30.0–36.0)
MCV: 85.7 fL (ref 78.0–100.0)
Platelets: 247 10*3/uL (ref 150–400)
RBC: 3.91 MIL/uL (ref 3.87–5.11)
RDW: 17.4 % — ABNORMAL HIGH (ref 11.5–15.5)
WBC: 5.4 10*3/uL (ref 4.0–10.5)

## 2017-03-06 LAB — COMPREHENSIVE METABOLIC PANEL
ALT: 16 U/L (ref 14–54)
AST: 19 U/L (ref 15–41)
Albumin: 3.6 g/dL (ref 3.5–5.0)
Alkaline Phosphatase: 96 U/L (ref 38–126)
Anion gap: 9 (ref 5–15)
BUN: 8 mg/dL (ref 6–20)
CO2: 24 mmol/L (ref 22–32)
Calcium: 8.6 mg/dL — ABNORMAL LOW (ref 8.9–10.3)
Chloride: 106 mmol/L (ref 101–111)
Creatinine, Ser: 0.69 mg/dL (ref 0.44–1.00)
GFR calc Af Amer: >60 mL/min (ref >60)
GFR calc non Af Amer: >60 mL/min (ref >60)
Glucose, Bld: 82 mg/dL (ref 65–99)
Potassium: 3.9 mmol/L (ref 3.5–5.1)
Sodium: 139 mmol/L (ref 135–145)
Total Bilirubin: 0.5 mg/dL (ref 0.3–1.2)
Total Protein: 6 g/dL — ABNORMAL LOW (ref 6.5–8.1)

## 2017-03-06 LAB — PROTIME-INR
INR: 1.1
Prothrombin Time: 14.2 seconds (ref 11.4–15.2)

## 2017-03-06 LAB — APTT: aPTT: 27 seconds (ref 24–36)

## 2017-03-06 SURGERY — INSERTION, PLEURAL DRAINAGE CATHETER
Anesthesia: Monitor Anesthesia Care | Site: Chest | Laterality: Right

## 2017-03-06 MED ORDER — LIDOCAINE HCL 1 % IJ SOLN
INTRAMUSCULAR | Status: DC | PRN
  Administered 2017-03-06: 10 mL

## 2017-03-06 MED ORDER — FENTANYL CITRATE (PF) 100 MCG/2ML IJ SOLN: 25.0000 ug | INTRAMUSCULAR | Status: DC | PRN

## 2017-03-06 MED ORDER — ACETAMINOPHEN 160 MG/5ML PO SOLN: 325.0000 mg | ORAL | Status: DC | PRN

## 2017-03-06 MED ORDER — CARVEDILOL 3.125 MG PO TABS
ORAL_TABLET | ORAL | Status: AC
  Administered 2017-03-06: 6.25 mg
  Filled 2017-03-06: qty 1

## 2017-03-06 MED ORDER — MIDAZOLAM HCL 2 MG/2ML IJ SOLN
INTRAMUSCULAR | Status: AC
  Filled 2017-03-06: qty 2

## 2017-03-06 MED ORDER — FENTANYL CITRATE (PF) 250 MCG/5ML IJ SOLN
INTRAMUSCULAR | Status: AC
Start: 2017-03-06 — End: ?
  Filled 2017-03-06: qty 5

## 2017-03-06 MED ORDER — ACETAMINOPHEN 325 MG PO TABS: 325.0000 mg | ORAL_TABLET | ORAL | Status: DC | PRN

## 2017-03-06 MED ORDER — MIDAZOLAM HCL 5 MG/5ML IJ SOLN
INTRAMUSCULAR | Status: DC | PRN
  Administered 2017-03-06: 2 mg via INTRAVENOUS

## 2017-03-06 MED ORDER — PROPOFOL 10 MG/ML IV BOLUS
INTRAVENOUS | Status: AC
  Filled 2017-03-06: qty 20

## 2017-03-06 MED ORDER — SODIUM CHLORIDE 0.9% FLUSH: 3.0000 mL | INTRAVENOUS | Status: DC | PRN

## 2017-03-06 MED ORDER — ACETAMINOPHEN 650 MG RE SUPP: 650.0000 mg | RECTAL | Status: DC | PRN

## 2017-03-06 MED ORDER — DEXTROSE 5 % IV SOLN
INTRAVENOUS | Status: AC
  Filled 2017-03-06: qty 1.5

## 2017-03-06 MED ORDER — PROPOFOL 1000 MG/100ML IV EMUL
INTRAVENOUS | Status: AC
  Filled 2017-03-06: qty 100

## 2017-03-06 MED ORDER — FENTANYL CITRATE (PF) 250 MCG/5ML IJ SOLN
INTRAMUSCULAR | Status: DC | PRN
  Administered 2017-03-06: 50 ug via INTRAVENOUS

## 2017-03-06 MED ORDER — OXYCODONE HCL 5 MG PO TABS: 5.0000 mg | ORAL_TABLET | Freq: Once | ORAL | Status: DC | PRN

## 2017-03-06 MED ORDER — 0.9 % SODIUM CHLORIDE (POUR BTL) OPTIME
TOPICAL | Status: DC | PRN
  Administered 2017-03-06: 1000 mL

## 2017-03-06 MED ORDER — DEXTROSE 5 % IV SOLN
1.5000 g | INTRAVENOUS | Status: AC
  Administered 2017-03-06: 1.5 g via INTRAVENOUS

## 2017-03-06 MED ORDER — DIPHENHYDRAMINE HCL 50 MG/ML IJ SOLN
INTRAMUSCULAR | Status: DC | PRN
  Administered 2017-03-06: 12.5 mg via INTRAVENOUS

## 2017-03-06 MED ORDER — SODIUM CHLORIDE 0.9 % IV SOLN: 250.0000 mL | INTRAVENOUS | Status: DC | PRN

## 2017-03-06 MED ORDER — OXYCODONE HCL 5 MG/5ML PO SOLN: 5.0000 mg | Freq: Once | ORAL | Status: DC | PRN

## 2017-03-06 MED ORDER — LACTATED RINGERS IV SOLN
INTRAVENOUS | Status: DC
  Administered 2017-03-06: 50 mL/h via INTRAVENOUS

## 2017-03-06 MED ORDER — LIDOCAINE HCL (CARDIAC) 20 MG/ML IV SOLN
INTRAVENOUS | Status: DC | PRN
  Administered 2017-03-06: 50 mg via INTRATRACHEAL

## 2017-03-06 MED ORDER — ACETAMINOPHEN 325 MG PO TABS: 650.0000 mg | ORAL_TABLET | ORAL | Status: DC | PRN

## 2017-03-06 MED ORDER — CARVEDILOL 3.125 MG PO TABS
ORAL_TABLET | ORAL | Status: AC
  Filled 2017-03-06: qty 1

## 2017-03-06 MED ORDER — PROPOFOL 500 MG/50ML IV EMUL
INTRAVENOUS | Status: DC | PRN
  Administered 2017-03-06: 75 ug/kg/min via INTRAVENOUS

## 2017-03-06 MED ORDER — SODIUM CHLORIDE 0.9% FLUSH: 3.0000 mL | Freq: Two times a day (BID) | INTRAVENOUS | Status: DC

## 2017-03-06 SURGICAL SUPPLY — 31 items
CANISTER SUCT 3000ML PPV (MISCELLANEOUS) ×3 IMPLANT
COVER SURGICAL LIGHT HANDLE (MISCELLANEOUS) ×3 IMPLANT
DERMABOND ADVANCED (GAUZE/BANDAGES/DRESSINGS) ×2
DERMABOND ADVANCED .7 DNX12 (GAUZE/BANDAGES/DRESSINGS) ×1 IMPLANT
DRAPE C-ARM 42X72 X-RAY (DRAPES) ×3 IMPLANT
DRAPE LAPAROSCOPIC ABDOMINAL (DRAPES) ×3 IMPLANT
GLOVE BIO SURGEON STRL SZ 6.5 (GLOVE) ×2 IMPLANT
GLOVE BIO SURGEON STRL SZ7.5 (GLOVE) ×6 IMPLANT
GLOVE BIO SURGEONS STRL SZ 6.5 (GLOVE) ×1
GLOVE BIOGEL PI IND STRL 6.5 (GLOVE) ×2 IMPLANT
GLOVE BIOGEL PI INDICATOR 6.5 (GLOVE) ×4
GLOVE ECLIPSE 6.5 STRL STRAW (GLOVE) ×3 IMPLANT
GOWN STRL REUS W/ TWL LRG LVL3 (GOWN DISPOSABLE) ×3 IMPLANT
GOWN STRL REUS W/TWL LRG LVL3 (GOWN DISPOSABLE) ×6
KIT BASIN OR (CUSTOM PROCEDURE TRAY) ×3 IMPLANT
KIT PLEURX DRAIN CATH 1000ML (MISCELLANEOUS) ×3 IMPLANT
KIT PLEURX DRAIN CATH 15.5FR (DRAIN) ×3 IMPLANT
KIT ROOM TURNOVER OR (KITS) ×3 IMPLANT
NEEDLE HYPO 25GX1X1/2 BEV (NEEDLE) ×3 IMPLANT
NS IRRIG 1000ML POUR BTL (IV SOLUTION) ×3 IMPLANT
PACK GENERAL/GYN (CUSTOM PROCEDURE TRAY) ×3 IMPLANT
PAD ARMBOARD 7.5X6 YLW CONV (MISCELLANEOUS) ×6 IMPLANT
SET DRAINAGE LINE (MISCELLANEOUS) IMPLANT
SUT ETHILON 3 0 PS 1 (SUTURE) ×3 IMPLANT
SUT SILK 2 0 SH (SUTURE) ×3 IMPLANT
SUT VIC AB 3-0 SH 8-18 (SUTURE) ×3 IMPLANT
SYR CONTROL 10ML LL (SYRINGE) ×3 IMPLANT
TOWEL OR 17X24 6PK STRL BLUE (TOWEL DISPOSABLE) ×3 IMPLANT
TOWEL OR 17X26 10 PK STRL BLUE (TOWEL DISPOSABLE) ×3 IMPLANT
VALVE REPLACEMENT CAP (MISCELLANEOUS) IMPLANT
WATER STERILE IRR 1000ML POUR (IV SOLUTION) ×3 IMPLANT

## 2017-03-06 NOTE — Progress Notes (Signed)
Pre Procedure note for inpatients:   Latoya Adams has been scheduled for Procedure(s): INSERTION PLEURAL DRAINAGE CATHETER (Right) today. The various methods of treatment have been discussed with the patient. After consideration of the risks, benefits and treatment options the patient has consented to the planned procedure.   The patient has been seen and labs reviewed. There are no changes in the patient's condition to prevent proceeding with the planned procedure today.  Recent labs:  Lab Results  Component Value Date   WBC 5.4 03/06/2017   HGB 10.5 (L) 03/06/2017   HCT 33.5 (L) 03/06/2017   PLT 247 03/06/2017   GLUCOSE 82 03/06/2017   CHOL 144 12/06/2016   TRIG 117 12/06/2016   HDL 32 (L) 12/06/2016   LDLCALC 89 12/06/2016   ALT 16 03/06/2017   AST 19 03/06/2017   NA 139 03/06/2017   K 3.9 03/06/2017   CL 106 03/06/2017   CREATININE 0.69 03/06/2017   BUN 8 03/06/2017   CO2 24 03/06/2017   TSH 4.875 (H) 12/06/2016   INR 1.10 03/06/2017   HGBA1C 5.4 12/06/2016    Mikey Bussing, MD 03/06/2017 2:41 PM

## 2017-03-06 NOTE — Care Management Note (Signed)
Case Management Note  Patient Details  Name: WARD AMMIRATI MRN: [id number] Date of Birth: Feb 25, 1946  Subjective/Objective:    71 year old female scheduled for insertion of R pleural drainage catheter on 03/06/2017 with Kerin Perna, MD                Action/Plan: CM consulted for HHS.  Spoke with pt who stated she has used [facility] in the past and would like to use them again.  Spoke with [facility] who states they will see the pt on Thurs.  No further CM needs noted at this time.  Expected Discharge Date:  03/06/17               Expected Discharge Plan:  Home w Home Health Services  Discharge planning Services  CM Consult  Post Acute Care Choice:  Home Health Choice offered to:  Patient  HH Arranged:  RN [facility] Agency:  [facility]  Status of Service:  Completed, signed off  Rica Koyanagi, RN 03/06/2017, 4:19 PM

## 2017-03-06 NOTE — Anesthesia Preprocedure Evaluation (Signed)
Anesthesia Evaluation  Patient identified by MRN, date of birth, ID band Patient awake    Reviewed: Allergy & Precautions, NPO status , Patient's Chart, lab work & pertinent test results, reviewed documented beta blocker date and time   History of Anesthesia Complications Negative for: history of anesthetic complications  Airway Mallampati: I  TM Distance: >3 FB Neck ROM: Full    Dental  (+) Teeth Intact   Pulmonary shortness of breath, COPD, Current Smoker, former smoker,    Pulmonary exam normal breath sounds clear to auscultation       Cardiovascular hypertension, Pt. on medications and Pt. on home beta blockers + CAD, + Past MI, + CABG and +CHF  Normal cardiovascular exam+ Valvular Problems/Murmurs MR and AI  Rhythm:Regular     Neuro/Psych PSYCHIATRIC DISORDERS Anxiety Depression    GI/Hepatic negative GI ROS, Neg liver ROS,   Endo/Other  negative endocrine ROS  Renal/GU negative Renal ROS     Musculoskeletal   Abdominal   Peds  Hematology  (+) anemia ,   Anesthesia Other Findings   Reproductive/Obstetrics                             Anesthesia Physical Anesthesia Plan  ASA: III  Anesthesia Plan: MAC   Post-op Pain Management:    Induction: Intravenous  PONV Risk Score and Plan: 2 and Ondansetron and Treatment may vary due to age or medical condition  Airway Management Planned: Nasal Cannula  Additional Equipment: None  Intra-op Plan:   Post-operative Plan:   Informed Consent: I have reviewed the patients History and Physical, chart, labs and discussed the procedure including the risks, benefits and alternatives for the proposed anesthesia with the patient or authorized representative who has indicated his/her understanding and acceptance.   Dental advisory given  Plan Discussed with: CRNA and Surgeon  Anesthesia Plan Comments:         Anesthesia Quick  Evaluation

## 2017-03-06 NOTE — Brief Op Note (Signed)
03/06/2017  3:48 PM  PATIENT:  Latoya Adams  71 y.o. female  PRE-OPERATIVE DIAGNOSIS:  RECURRENT RIGHT PLEURAL EFFUSION  POST-OPERATIVE DIAGNOSIS:  RECURRENT RIGHT PLEURAL EFFUSION  PROCEDURE:  Procedure(s): INSERTION PLEURAL DRAINAGE CATHETER (Right)Drainage Right Pleural Effusion 1.0 Liter SURGEON:  Surgeon(s) and Role:    Kerin Perna, MD - Primary  PHYSICIAN ASSISTANT:   ASSISTANTS: none  ANESTHESIA:   local and IV sedation  EBL:  Total I/O In: 800 [I.V.:800] Out: 5 [Blood:5]  BLOOD ADMINISTERED:none  DRAINS: none   LOCAL MEDICATIONS USED:  LIDOCAINE  and Amount: 8 ml  SPECIMEN:  No Specimen  DISPOSITION OF SPECIMEN:  N/A  COUNTS:  YES  TOURNIQUET:  * No tourniquets in log *  DICTATION: .Dragon Dictation  PLAN OF CARE: Discharge to home after PACU  PATIENT DISPOSITION:  PACU - hemodynamically stable.   Delay start of Pharmacological VTE agent (>24hrs) due to surgical blood loss or risk of bleeding: yes

## 2017-03-06 NOTE — H&P (Signed)
Cardiothoracic Surgery  Progress Notes Encounter Date: 02/28/2017    Related encounter: Office Visit from 02/28/2017 in [facility] copied text Hover for attribution information PCP is System, Pcp Not In Referring Provider is Tonny Bollman, MD       Chief Complaint  Patient presents with  . Routine Post Op      3 wk f/u with a CXR...s/p CABG X 4 12/08/16..to reassess right pl effusion after 2 thoracentesis      HPI: Scheduled follow-up 1 month after multivessel CABG after completed infarction. The patient has ischemic cardiomyopathy with EF of 20% and is followed at the heart failure clinic. She has had a recurrent right pleural effusion and has had a right thoracentesis 2. She presents with chest x-ray today for evaluation of the right pleural effusion. Since I removed the last thoracentesis fluid the effusion has partially recurred. It is not as significant as the original effusion but is still mild-moderate. She feels she cannot take a deep breath but she denies shortness of breath or difficulty with normal activities. She's had some mild ankle edema as well. She has normal renal function and her diuretic therapy is helping however I feel she would probably benefit from a right Pleurx catheter to manage her weight gain and fluid retention from ischemic cardiomyopathy. She'll be seeing her heart failure cardiologist in clinic tomorrow . We'll proceed with placement right Pleurx catheter upon recommendation of her cardiologist  unless her diuretic regimen can be  increased.     Past Medical History:  Diagnosis Date  . Alcohol abuse    . Hypertension             Past Surgical History:  Procedure Laterality Date  . CORONARY ARTERY BYPASS GRAFT N/A 12/08/2016    Procedure: CORONARY ARTERY BYPASS GRAFTING (CABG)x4 using left internal mammary artery and bilateral greater saphenous veins harvested endoscopically;  Surgeon: Kerin Perna,  MD;  Location: [facility] OR;  Service: Open Heart Surgery;  Laterality: N/A;  . dental implants      . IR THORACENTESIS ASP PLEURAL SPACE W/IMG GUIDE   01/24/2017  . LEFT HEART CATH AND CORONARY ANGIOGRAPHY N/A 12/03/2016    Procedure: Left Heart Cath and Coronary Angiography;  Surgeon: Tonny Bollman, MD;  Location[facility] INVASIVE CV LAB;  Service: Cardiovascular;  Laterality: N/A;  . RIGHT HEART CATH N/A 12/05/2016    Procedure: Right Heart Cath;  Surgeon: Dolores Patty, MD;  Location: [facility] INVASIVE CV LAB;  Service: Cardiovascular;  Laterality: N/A;  . TEE WITHOUT CARDIOVERSION N/A 12/08/2016    Procedure: TRANSESOPHAGEAL ECHOCARDIOGRAM (TEE);  Surgeon: Donata Clay, Theron Arista, MD;  Location: [facility] OR;  Service: Open Heart Surgery;  Laterality: N/A;  . TUBAL LIGATION               Family History  Problem Relation Age of Onset  . Heart attack Neg Hx        Social History      Social History  Substance Use Topics  . Smoking status: Former Smoker      Types: Cigarettes      Quit date: 12/03/2016  . Smokeless tobacco: Never Used  . Alcohol use No            Current Outpatient Prescriptions  Medication Sig Dispense Refill  . atorvastatin (LIPITOR) 80 MG tablet Take 1 tablet (80 mg total) by mouth daily at 6 PM. 30 tablet 1  .  buPROPion (WELLBUTRIN XL) 300 MG 24 hr tablet Take 300 mg by mouth daily.      . carvedilol (COREG) 3.125 MG tablet Take 2 tablets (6.25 mg total) by mouth 2 (two) times daily with a meal. 360 tablet 3  . digoxin (LANOXIN) 0.125 MG tablet Take 1 tablet (0.125 mg total) by mouth daily. 90 tablet 3  . furosemide (LASIX) 40 MG tablet Take 1 tablet (40 mg total) by mouth daily. 30 tablet 1  . losartan (COZAAR) 25 MG tablet Take 0.5 tablets (12.5 mg total) by mouth daily. 45 tablet 3  . mometasone-formoterol (DULERA) 200-5 MCG/ACT AERO Inhale 2 puffs into the lungs 2 (two) times daily.      . potassium chloride SA (K-DUR,KLOR-CON) 20 MEQ tablet Take 3 tablets (60 mEq total) by mouth  daily. 270 tablet 3  . sertraline (ZOLOFT) 100 MG tablet Take 200 mg by mouth daily.      Marland Kitchen spironolactone (ALDACTONE) 25 MG tablet Take 1 tablet (25 mg total) by mouth daily. 90 tablet 3  . aspirin EC 325 MG EC tablet Take 1 tablet (325 mg total) by mouth daily. 30 tablet 0    No current facility-administered medications for this visit.           Allergies  Allergen Reactions  . Codeine Hives      Review of Systems  Concern about weight gain Recurrent right pleural effusion on chest x-ray mild-moderate   BP 113/69 (BP Location: Left Arm, Patient Position: Sitting, Cuff Size: Large)   Pulse 94   Resp 16   Ht 5\' 8"  (1.727 m)   Wt 119 lb (54 kg)   SpO2 97% Comment: ON RA  BMI 18.09 kg/m  Physical Exam Alert and comfortable Diminished breath sounds right base Appears to be in sinus rhythm without murmur Minimal ankle edema   Diagnostic Tests: Chest x-ray images personally reviewed and discussed with patient   Impression: Postop recurrent right pleural effusion status post thoracentesis 2 We'll proceed with Pleurx catheter if felt to be indicated by her heart failure cardiologist.   Plan: Return for follow-up in 3 weeks with chest x-ray   Mikey Bussing, MD [facility] [phone]

## 2017-03-06 NOTE — Transfer of Care (Signed)
Immediate Anesthesia Transfer of Care Note  Patient: Latoya Adams  Procedure(s) Performed: INSERTION PLEURAL DRAINAGE CATHETER (Right Chest)  Patient Location: PACU  Anesthesia Type:MAC  Level of Consciousness: awake, alert  and oriented  Airway & Oxygen Therapy: Patient Spontanous Breathing and Patient connected to nasal cannula oxygen  Post-op Assessment: Report given to RN, Post -op Vital signs reviewed and stable and Patient moving all extremities X 4  Post vital signs: Reviewed and stable  Last Vitals:  Vitals:   03/06/17 1214 03/06/17 1545  BP: 128/63 118/64  Pulse: 82 77  Resp: 16 16  Temp: 36.6 C   SpO2: 93% 99%    Last Pain:  Vitals:   03/06/17 1214  TempSrc: Oral      Patients Stated Pain Goal: 3 (54/36/06 7703)  Complications: No apparent anesthesia complications

## 2017-03-07 ENCOUNTER — Encounter (HOSPITAL_COMMUNITY): Payer: Self-pay | Admitting: Cardiothoracic Surgery

## 2017-03-07 NOTE — Anesthesia Postprocedure Evaluation (Signed)
Anesthesia Post Note  Patient: Latoya Adams  Procedure(s) Performed: INSERTION PLEURAL DRAINAGE CATHETER (Right Chest)     Patient location during evaluation: PACU Anesthesia Type: MAC Level of consciousness: awake and alert Pain management: pain level controlled Vital Signs Assessment: post-procedure vital signs reviewed and stable Respiratory status: spontaneous breathing, nonlabored ventilation, respiratory function stable and patient connected to nasal cannula oxygen Cardiovascular status: stable and blood pressure returned to baseline Postop Assessment: no apparent nausea or vomiting Anesthetic complications: no    Last Vitals:  Vitals:   03/06/17 1600 03/06/17 1614  BP: 124/63 122/61  Pulse: 76 73  Resp: 18 17  Temp:  36.5 C  SpO2: 100% 96%    Last Pain:  Vitals:   03/06/17 1615  TempSrc:   PainSc: 0-No pain                 Blas Riches

## 2017-03-07 NOTE — Op Note (Signed)
NAME:  Latoya Adams, Latoya Adams                     ACCOUNT NO.:  MEDICAL RECORD NO.:  [id number]  LOCATION:                                 FACILITY:  PHYSICIAN:  Kerin Perna, M.D.  DATE OF BIRTH:  [birth year]  DATE OF PROCEDURE:  03/06/2017 DATE OF DISCHARGE:                              OPERATIVE REPORT   OPERATION:  Placement of right PleurX catheter.  SURGEON:  Kerin Perna, MD.  PREOPERATIVE DIAGNOSES:  Ischemic cardiomyopathy, history of coronary artery bypass grafting after completed, myocardial infarction with recurrent right pleural effusion secondary to systolic heart failure.  ANESTHESIA:  IV conscious sedation monitored by Anesthesia and local with 1% lidocaine.  DESCRIPTION OF PROCEDURE:  The patient was brought from the preop holding after informed consent had been obtained and proper site was marked and the procedure was discussed and reviewed with the patient and her family.  The patient was placed supine on the operating table with the right arm tucked and the right side under a small soft rolled sheet. The right chest was prepped and draped as a sterile field.  A proper time-out was performed.  1% lidocaine was infiltrated in the anterior axillary line at the fifth interspace and in the midclavicular line at the right costal margin.  Two small incisions were made in each area. Through the upper incision, the pleural effusion was identified with a 22-gauge needle followed by a larger needle with a sheath which was placed into the inferior location of the pleural effusion.  Through the sheath, a guidewire was passed and this was documented to be intrathoracic in the pleural space by C-arm fluoroscopy.  Next, the PleurX catheter was tunneled from the lower incision to the upper incision.  Next, over the dilator, then a dilator sheath was placed in the pleural space.  Next, through the tear-away sheath, the PleurX catheter was advanced after being trimmed to the  appropriate length.  Next, the PleurX catheter was visualized on C-arm fluoroscopy to be in the right pleural space posteriorly.  Next, the PleurX catheter was connected to the vacuum bottle and 1 L of fluid was drained.  The catheter was then capped.  The incisions were closed, and the sterile PleurX dressing with a sterile transparent cover was placed, and the patient returned to the recovery room in stable condition.  A chest x-ray taken in the operating room showed good position of the catheter with good drainage of the right pleural effusion.     Kerin Perna, M.D.     PV/MEDQ  D:  03/06/2017  T:  03/07/2017  Job:  [id number]  cc:   Bevelyn Buckles. Bensimhon, MD

## 2017-03-12 NOTE — Telephone Encounter (Signed)
Patient cancelled OV.

## 2017-03-21 ENCOUNTER — Other Ambulatory Visit: Payer: Self-pay | Admitting: Cardiothoracic Surgery

## 2017-03-21 ENCOUNTER — Ambulatory Visit
Admission: RE | Admit: 2017-03-21 | Discharge: 2017-03-21 | Disposition: A | Discharge status: Home / Self Care | Payer: Medicare PPO | Source: Ambulatory Visit | Attending: Cardiothoracic Surgery | Admitting: Cardiothoracic Surgery

## 2017-03-21 ENCOUNTER — Encounter: Payer: Self-pay | Admitting: Cardiothoracic Surgery

## 2017-03-21 ENCOUNTER — Ambulatory Visit (INDEPENDENT_AMBULATORY_CARE_PROVIDER_SITE_OTHER): Payer: Medicare PPO | Admitting: Cardiothoracic Surgery

## 2017-03-21 VITALS — BP 101/57 | HR 87 | Ht 68.0 in | Wt 120.0 lb

## 2017-03-21 DIAGNOSIS — Z951 Presence of aortocoronary bypass graft: Secondary | ICD-10-CM | POA: Diagnosis not present

## 2017-03-21 DIAGNOSIS — I509 Heart failure, unspecified: Secondary | ICD-10-CM | POA: Diagnosis not present

## 2017-03-21 DIAGNOSIS — I5022 Chronic systolic (congestive) heart failure: Secondary | ICD-10-CM

## 2017-03-21 NOTE — Progress Notes (Addendum)
PCP is Carmin Richmondavis, James W, MD Referring Provider is Tonny Bollmanooper, Michael, MD  Chief Complaint  Patient presents with  . Coronary Artery Disease    ZOX:WRUEHPI:very nice 71 year old patient had multivessel CABG after a completed MI  and now has ischemic cardiomyopathy. Because of a recurrent right pleural effusion she had a right Pleurx catheter placed 2 weeks ago. This is being drained Monday Wednesday Friday-400 cc per session generally clear yellow fluid. Home health nurses are assisting. The catheter site is clean and healing. Her skin sutures were removed in the office today and she was drained of 350 cc of clear yellow fluid.  Chest x-ray shows minimal right pleural effusion with catheter in good position in the subpulmonic space on the right side The patient'sankle edema appears to be improving with oral Lasix.  Past Medical History:  Diagnosis Date  . Alcohol abuse   . Anxiety   . Depression   . Dyspnea    due to Pulmonary Effusion  . History of blood transfusion 2018   during CABG  . Hypertension   . Myocardial infarction [facility](HCC) 12/2016    Past Surgical History:  Procedure Laterality Date  . CHEST TUBE INSERTION Right 03/06/2017   Procedure: INSERTION PLEURAL DRAINAGE CATHETER;  Surgeon: Kerin PernaVan Trigt, Jezel Basto, MD;  [facility];  Service: Thoracic;  Laterality: Right;  . COLONOSCOPY    . CORONARY ARTERY BYPASS GRAFT N/A 12/08/2016   Procedure: CORONARY ARTERY BYPASS GRAFTING (CABG)x4 using left internal mammary artery and bilateral greater saphenous veins harvested endoscopically;  Surgeon: Kerin PernaVan Trigt, Kari Montero, MD;  [facility];  Service: Open Heart Surgery;  Laterality: N/A;  . dental implants    . IR THORACENTESIS ASP PLEURAL SPACE W/IMG GUIDE  01/24/2017  . LEFT HEART CATH AND CORONARY ANGIOGRAPHY N/A 12/03/2016   Procedure: Left Heart Cath and Coronary Angiography;  Surgeon: Tonny Bollmanooper, Michael, MD;  [facility];  Service: Cardiovascular;  Laterality: N/A;  . RIGHT HEART CATH  N/A 12/05/2016   Procedure: Right Heart Cath;  Surgeon: Dolores PattyBensimhon, Daniel R, MD;  [facility];  Service: Cardiovascular;  Laterality: N/A;  . TEE WITHOUT CARDIOVERSION N/A 12/08/2016   Procedure: TRANSESOPHAGEAL ECHOCARDIOGRAM (TEE);  Surgeon: Donata ClayVan Trigt, Theron AristaPeter, MD;  [facility];  Service: Open Heart Surgery;  Laterality: N/A;  . TUBAL LIGATION      Family History  Problem Relation Age of Onset  . Heart attack Neg Hx     Social History Social History  Substance Use Topics  . Smoking status: Former Smoker    Years: 50.00    Types: Cigarettes    Quit date: 12/03/2016  . Smokeless tobacco: Never Used  . Alcohol use No     Comment: Recovering  Alocholic    Current Outpatient Prescriptions  Medication Sig Dispense Refill  . aspirin EC 325 MG EC tablet Take 1 tablet (325 mg total) by mouth daily. 30 tablet 0  . atorvastatin (LIPITOR) 80 MG tablet Take 1 tablet (80 mg total) by mouth daily at 6 PM. 30 tablet 1  . buPROPion (WELLBUTRIN XL) 300 MG 24 hr tablet Take 300 mg by mouth daily.    . carvedilol (COREG) 3.125 MG tablet Take 2 tablets (6.25 mg total) by mouth 2 (two) times daily with a meal. 360 tablet 3  . digoxin (LANOXIN) 0.125 MG tablet Take 1 tablet (0.125 mg total) by mouth daily. 90 tablet 3  . furosemide (LASIX) 40 MG tablet Take 1 tablet (40 mg total)  by mouth daily. 30 tablet 1  . losartan (COZAAR) 25 MG tablet Take 0.5 tablets (12.5 mg total) by mouth daily. 45 tablet 3  . mometasone-formoterol (DULERA) 200-5 MCG/ACT AERO Inhale 2 puffs into the lungs 2 (two) times daily.    . potassium chloride SA (K-DUR,KLOR-CON) 20 MEQ tablet Take 3 tablets (60 mEq total) by mouth daily. (Patient taking differently: Take 20-40 mEq by mouth See admin instructions. Take 2 in the morning, and 1 tablet in night) 270 tablet 3  . sertraline (ZOLOFT) 100 MG tablet Take 200 mg by mouth daily.    Marland Kitchen spironolactone (ALDACTONE) 25 MG tablet Take 1 tablet (25 mg total) by mouth daily.  90 tablet 3   No current facility-administered medications for this visit.     Allergies  Allergen Reactions  . Codeine Hives    Review of Systems  Improved exercise tolerance Wants to start cardiac rehabilitation which was encouraged  BP (!) 101/57   Pulse 87   Ht 5\' 8"  (1.727 m)   Wt 120 lb (54.4 kg)   SpO2 97%   BMI 18.25 kg/m  Physical Exam      Exam    General- alert and comfortable   Lungs- clear without rales, wheezes   Cor- regular rate and rhythm, no murmur , gallop   Abdomen- soft, non-tender   Extremities - warm, non-tender, minimal edema   Neuro- oriented, appropriate, no focal weakness   Diagnostic Tests: Chest x-ray clear with pleurx catheter in good position  Impression: Functioning Pleurx catheter with resolution of persistent right pleural effusion. Continue drainage schedule Monday Wednesday Friday.  when catheter drainage volume drops less than 300 cc then cut drainage schedule to Mondays and Thursdays  Plan: Return for review of progress with Pleurx catheter in for-6 weeks Patient recommended to increase her protein intake for loss in pleural fluid removed each drainage session Ready to start outpatient cardiac rehabilitation  Mikey Bussing, MD [facility] [phone]

## 2017-03-23 ENCOUNTER — Other Ambulatory Visit (HOSPITAL_COMMUNITY): Payer: Self-pay | Admitting: Pharmacist

## 2017-03-23 MED ORDER — CARVEDILOL 6.25 MG PO TABS: 6.2500 mg | ORAL_TABLET | Freq: Two times a day (BID) | ORAL | 3 refills | Status: DC

## 2017-03-26 ENCOUNTER — Other Ambulatory Visit (HOSPITAL_COMMUNITY): Payer: Self-pay | Admitting: Pharmacist

## 2017-03-26 MED ORDER — CARVEDILOL 6.25 MG PO TABS: 6.2500 mg | ORAL_TABLET | Freq: Two times a day (BID) | ORAL | 3 refills | Status: DC

## 2017-04-10 ENCOUNTER — Ambulatory Visit (HOSPITAL_COMMUNITY)
Admission: RE | Admit: 2017-04-10 | Discharge: 2017-04-10 | Disposition: A | Discharge status: Home / Self Care | Payer: Medicare PPO | Source: Ambulatory Visit | Attending: Family Medicine | Admitting: Family Medicine

## 2017-04-10 ENCOUNTER — Encounter (HOSPITAL_COMMUNITY): Payer: Self-pay | Admitting: Internal Medicine

## 2017-04-10 ENCOUNTER — Ambulatory Visit (HOSPITAL_BASED_OUTPATIENT_CLINIC_OR_DEPARTMENT_OTHER)
Admission: RE | Admit: 2017-04-10 | Discharge: 2017-04-10 | Disposition: A | Discharge status: Home / Self Care | Payer: Medicare PPO | Source: Ambulatory Visit | Attending: Internal Medicine | Admitting: Internal Medicine

## 2017-04-10 VITALS — BP 118/75 | HR 65 | Wt 121.0 lb

## 2017-04-10 DIAGNOSIS — J90 Pleural effusion, not elsewhere classified: Secondary | ICD-10-CM

## 2017-04-10 DIAGNOSIS — I5022 Chronic systolic (congestive) heart failure: Secondary | ICD-10-CM | POA: Diagnosis not present

## 2017-04-10 DIAGNOSIS — I2510 Atherosclerotic heart disease of native coronary artery without angina pectoris: Secondary | ICD-10-CM

## 2017-04-10 DIAGNOSIS — I214 Non-ST elevation (NSTEMI) myocardial infarction: Secondary | ICD-10-CM | POA: Insufficient documentation

## 2017-04-10 DIAGNOSIS — I110 Hypertensive heart disease with heart failure: Secondary | ICD-10-CM | POA: Insufficient documentation

## 2017-04-10 DIAGNOSIS — I080 Rheumatic disorders of both mitral and aortic valves: Secondary | ICD-10-CM | POA: Insufficient documentation

## 2017-04-10 DIAGNOSIS — I252 Old myocardial infarction: Secondary | ICD-10-CM | POA: Diagnosis not present

## 2017-04-10 DIAGNOSIS — Z79899 Other long term (current) drug therapy: Secondary | ICD-10-CM | POA: Diagnosis not present

## 2017-04-10 DIAGNOSIS — J449 Chronic obstructive pulmonary disease, unspecified: Secondary | ICD-10-CM | POA: Diagnosis not present

## 2017-04-10 DIAGNOSIS — D649 Anemia, unspecified: Secondary | ICD-10-CM | POA: Insufficient documentation

## 2017-04-10 DIAGNOSIS — I739 Peripheral vascular disease, unspecified: Secondary | ICD-10-CM | POA: Insufficient documentation

## 2017-04-10 DIAGNOSIS — F329 Major depressive disorder, single episode, unspecified: Secondary | ICD-10-CM | POA: Insufficient documentation

## 2017-04-10 DIAGNOSIS — Z951 Presence of aortocoronary bypass graft: Secondary | ICD-10-CM | POA: Insufficient documentation

## 2017-04-10 DIAGNOSIS — Z7982 Long term (current) use of aspirin: Secondary | ICD-10-CM | POA: Diagnosis not present

## 2017-04-10 DIAGNOSIS — F419 Anxiety disorder, unspecified: Secondary | ICD-10-CM | POA: Diagnosis not present

## 2017-04-10 DIAGNOSIS — Z720 Tobacco use: Secondary | ICD-10-CM | POA: Insufficient documentation

## 2017-04-10 LAB — COMPREHENSIVE METABOLIC PANEL
ALK PHOS: 101 U/L (ref 38–126)
ALT: 14 U/L (ref 14–54)
AST: 17 U/L (ref 15–41)
Albumin: 3.3 g/dL — ABNORMAL LOW (ref 3.5–5.0)
Anion gap: 7 (ref 5–15)
BILIRUBIN TOTAL: 0.6 mg/dL (ref 0.3–1.2)
BUN: 12 mg/dL (ref 6–20)
CALCIUM: 8.4 mg/dL — AB (ref 8.9–10.3)
CO2: 26 mmol/L (ref 22–32)
CREATININE: 0.7 mg/dL (ref 0.44–1.00)
Chloride: 105 mmol/L (ref 101–111)
GFR calc non Af Amer: >60 mL/min (ref >60)
Glucose, Bld: 94 mg/dL (ref 65–99)
Potassium: 4 mmol/L (ref 3.5–5.1)
Sodium: 138 mmol/L (ref 135–145)
TOTAL PROTEIN: 6.2 g/dL — AB (ref 6.5–8.1)

## 2017-04-10 LAB — CBC
HCT: 32.2 % — ABNORMAL LOW (ref 36.0–46.0)
HEMOGLOBIN: 10.4 g/dL — AB (ref 12.0–15.0)
MCH: 27.7 pg (ref 26.0–34.0)
MCHC: 32.3 g/dL (ref 30.0–36.0)
MCV: 85.6 fL (ref 78.0–100.0)
PLATELETS: 251 10*3/uL (ref 150–400)
RBC: 3.76 MIL/uL — AB (ref 3.87–5.11)
RDW: 17.3 % — ABNORMAL HIGH (ref 11.5–15.5)
WBC: 5.2 10*3/uL (ref 4.0–10.5)

## 2017-04-10 LAB — BRAIN NATRIURETIC PEPTIDE: B NATRIURETIC PEPTIDE 5: 643.7 pg/mL — AB (ref 0.0–100.0)

## 2017-04-10 LAB — DIGOXIN LEVEL: DIGOXIN LVL: 0.4 ng/mL — AB (ref 0.8–2.0)

## 2017-04-10 MED ORDER — LOSARTAN POTASSIUM 25 MG PO TABS: 25.0000 mg | ORAL_TABLET | Freq: Every day | ORAL | 3 refills | Status: DC

## 2017-04-10 MED ORDER — ASPIRIN EC 81 MG PO TBEC: 81.0000 mg | DELAYED_RELEASE_TABLET | Freq: Every day | ORAL | Status: DC

## 2017-04-10 MED ORDER — FUROSEMIDE 40 MG PO TABS: ORAL_TABLET | ORAL | 3 refills | Status: DC

## 2017-04-10 NOTE — Patient Instructions (Signed)
Increase Losartan to 25 mg (1 tab) daily  Increase Furosemide to 40 mg in AM and 20 mg (1/2 tab) in PM  Decrease Aspirin to 81 mg daily  Labs today  Your physician recommends that you schedule a follow-up appointment in: 3-4 weeks with Cicero Duck, Pharm D  We will contact you in 3 months to schedule your next appointment.

## 2017-04-10 NOTE — Progress Notes (Signed)
Advanced Heart Failure Clinic Note   Primary Care: Dr. Lois Huxley in [facility] Primary Cardiologist:  HPI:  Latoya Adams is a 71 y.o. female with history of CAD s/ CABG x 4 12/08/2016, chronic systolic CHF cMRI EF 24% with only small apex scar, Tobacco use, COPD, and chronic anemia.   Pt admitted 12/03/2016 with NSTEMI, Troponin peak 8.5. Taken for urgent cath as below which showed severe multivessel disease and depressed EF. Echo with LVEF ~ 25%. cMRI viability study only showed small apical scar. Taken for CABG on 12/08/16. Pt recovery was relatively unremarkable. She tolerated adjustment of her HF medications and was sent to SNF on discharge. Discharge weight was 134 lbs.   Underwent Pleurex catheter for recurrent R pleural effusion on 03/06/17   Overall feeling ok. Feeling better. Able to do all ADLs without too much problem but has to go slowly. If goes to [employer] will use electric cart. Pleurex still draining several times per week. No CP. Still with some pedal edema.  Echo EF 20-25% RV normal moderate MR    Review of systems complete and found to be negative unless listed in HPI.    Past Medical History:  Diagnosis Date  . Alcohol abuse   . Anxiety   . Depression   . Dyspnea    due to Pulmonary Effusion  . History of blood transfusion 2018   during CABG  . Hypertension   . Myocardial infarction [facility]) 12/2016    Current Outpatient Medications  Medication Sig Dispense Refill  . aspirin EC 325 MG EC tablet Take 1 tablet (325 mg total) by mouth daily. 30 tablet 0  . atorvastatin (LIPITOR) 80 MG tablet Take 1 tablet (80 mg total) by mouth daily at 6 PM. 30 tablet 1  . buPROPion (WELLBUTRIN XL) 300 MG 24 hr tablet Take 300 mg by mouth daily.    . carvedilol (COREG) 6.25 MG tablet Take 1 tablet (6.25 mg total) by mouth 2 (two) times daily with a meal. 60 tablet 3  . digoxin (LANOXIN) 0.125 MG tablet Take 1 tablet (0.125 mg total) by mouth daily. 90 tablet 3  . furosemide  (LASIX) 40 MG tablet Take 1 tablet (40 mg total) by mouth daily. 30 tablet 1  . losartan (COZAAR) 25 MG tablet Take 0.5 tablets (12.5 mg total) by mouth daily. 45 tablet 3  . mometasone-formoterol (DULERA) 200-5 MCG/ACT AERO Inhale 2 puffs into the lungs 2 (two) times daily.    . potassium chloride SA (K-DUR,KLOR-CON) 20 MEQ tablet Take 3 tablets (60 mEq total) by mouth daily. (Patient taking differently: Take 20-40 mEq by mouth See admin instructions. Take 2 in the morning, and 1 tablet in night) 270 tablet 3  . sertraline (ZOLOFT) 100 MG tablet Take 200 mg by mouth daily.    Marland Kitchen spironolactone (ALDACTONE) 25 MG tablet Take 1 tablet (25 mg total) by mouth daily. 90 tablet 3   No current facility-administered medications for this encounter.    Allergies  Allergen Reactions  . Codeine Hives    Social History   Socioeconomic History  . Marital status: Single    Spouse name: Not on file  . Number of children: Not on file  . Years of education: Not on file  . Highest education level: Not on file  Social Needs  . Financial resource strain: Not on file  . Food insecurity - worry: Not on file  . Food insecurity - inability: Not on file  . Transportation needs -  medical: Not on file  . Transportation needs - non-medical: Not on file  Occupational History  . Not on file  Tobacco Use  . Smoking status: Former Smoker    Years: 50.00    Types: Cigarettes    Last attempt to quit: 12/03/2016    Years since quitting: 0.3  . Smokeless tobacco: Never Used  Substance and Sexual Activity  . Alcohol use: No    Comment: Recovering  Alocholic  . Drug use: No  . Sexual activity: Not on file  Other Topics Concern  . Not on file  Social History Narrative  . Not on file      Family History  Problem Relation Age of Onset  . Heart attack Neg Hx     Vitals:   04/10/17 1511  BP: 118/75  Pulse: 65  SpO2: 95%  Weight: 121 lb (54.9 kg)   Wt Readings from Last 3 Encounters:  04/10/17 121 lb  (54.9 kg)  03/21/17 120 lb (54.4 kg)  03/06/17 122 lb 12.8 oz (55.7 kg)   PHYSICAL EXAM: General:  Well appearing. No resp difficulty HEENT: normal Neck: supple. JVP 9  Carotids 2+ bilat; no bruits. No lymphadenopathy or thryomegaly appreciated. Cor: PMI laterally displaced. Regular rate & rhythm. No rubs, gallops or murmurs. Lungs: clear. R pleurex cath with dressing Abdomen: soft, nontender, nondistended. No hepatosplenomegaly. No bruits or masses. Good bowel sounds. Extremities: no cyanosis, clubbing, rash, 1+ ankle edema Neuro: alert & orientedx3, cranial nerves grossly intact. moves all 4 extremities w/o difficulty. Affect pleasant  ASSESSMENT & PLAN:  1. Chronic systolic HF - Pre-op echo (7/18) EF 20-25% with normal RV - Pre-op cMRI (7/18) EF 24% with only small scar in apex. RHC with normal CO and no PAH - s/p CABG x 4. 12/08/16 - Continues to improve slowly NYHA II-III - Volume status mildly elevated - Continue dig 0.125 mg daily. - Continue carvedilol 3.125 mg twice a day.  - Increase lasix to 40/20 - Increase losartan 25 daily (failed Entresto due to low BP) - Continue spiro daily.  - Check labs today - Echo today viewed personally EF 20-25%. I am still hopeful that EF will improve so will not refer for ICD yet until meds better titrqted - Refer back to pharmD  2. NSTEMI/CAD -- Critical pLAD disease with ulcerated plaque at bifurcation with D1 as well as RPDA 90% lesion.  RHC ok. PFTs with severe COPD. Carotid u/s ok.  -- S/P CABG x4 on 12/08/16 . On ASA and statin.  --No s/s ischemia --[name] - Decrease ASA to 81 mg daily  3. Recurrent R pleural effusion - s/p Pleurex. Continues to drain - Will give tramadol for pain as needed - Follows with Dr. Donata ClayVan Trigt  4. PAD -- Stable.   5. Tobacco use/COPD -- PFTs show severe COPD.  - She remains abstinent from cigarettes.      Arvilla MeresBensimhon, Briceida Rasberry, MD 04/10/17

## 2017-04-10 NOTE — Progress Notes (Signed)
  Echocardiogram 2D Echocardiogram has been performed.  Delcie Roch 04/10/2017, 3:07 PM

## 2017-05-02 ENCOUNTER — Other Ambulatory Visit: Payer: Self-pay

## 2017-05-02 ENCOUNTER — Ambulatory Visit: Payer: Medicare PPO | Admitting: Cardiothoracic Surgery

## 2017-05-02 ENCOUNTER — Encounter: Payer: Self-pay | Admitting: Cardiothoracic Surgery

## 2017-05-02 ENCOUNTER — Ambulatory Visit (HOSPITAL_COMMUNITY): Payer: Medicare PPO

## 2017-05-02 ENCOUNTER — Ambulatory Visit
Admission: RE | Admit: 2017-05-02 | Discharge: 2017-05-02 | Disposition: A | Discharge status: Home / Self Care | Payer: Medicare PPO | Source: Ambulatory Visit | Attending: Cardiothoracic Surgery | Admitting: Cardiothoracic Surgery

## 2017-05-02 VITALS — BP 107/65 | HR 81 | Resp 16 | Ht 68.0 in | Wt 123.8 lb

## 2017-05-02 DIAGNOSIS — Z951 Presence of aortocoronary bypass graft: Secondary | ICD-10-CM

## 2017-05-02 DIAGNOSIS — I509 Heart failure, unspecified: Secondary | ICD-10-CM

## 2017-05-02 DIAGNOSIS — Z9689 Presence of other specified functional implants: Secondary | ICD-10-CM

## 2017-05-02 NOTE — Progress Notes (Signed)
PCP is Carmin Richmondavis, James W, MD Referring Provider is Tonny Bollmanooper, Michael, MD  Chief Complaint  Patient presents with  . Routine Post Op    5 wk f/u with a CXR to reassess R pl eff/pleurX.Marland Kitchen.Marland Kitchen.DRAINING M/TH    HPI: Patient returns for right Pleurx catheter check. Recurrent right pleural effusion from heart failure. Patient had large completed acute MI followed by CABG. Her ejection fraction is 20%. She is followed at the advanced heart failure clinic. Her symptoms of heart failure have significantly improved over the past 3-4 weeks with a p.m. dose of Lasix. Pleurx drainage has dropped from 300 cc twice a week down to 1-200 cc twice a week. We will continue the Pleurx catheter drainage schedule Monday and Thursday until the drainage is significantly less than 100 cc per session. At that time the schedule be reduced to 1 drainage per week. Chest x-ray today shows completely clear lung fields no pleural effusion. Evidence of COPD from prior smoking.    Past Medical History:  Diagnosis Date  . Alcohol abuse   . Anxiety   . Depression   . Dyspnea    due to Pulmonary Effusion  . History of blood transfusion 2018   during CABG  . Hypertension   . Myocardial infarction [facility](HCC) 12/2016    Past Surgical History:  Procedure Laterality Date  . CHEST TUBE INSERTION Right 03/06/2017   Procedure: INSERTION PLEURAL DRAINAGE CATHETER;  Surgeon: Kerin PernaVan Trigt, Ragen Laver, MD;  [facility];  Service: Thoracic;  Laterality: Right;  . COLONOSCOPY    . CORONARY ARTERY BYPASS GRAFT N/A 12/08/2016   Procedure: CORONARY ARTERY BYPASS GRAFTING (CABG)x4 using left internal mammary artery and bilateral greater saphenous veins harvested endoscopically;  Surgeon: Kerin PernaVan Trigt, Jerman Tinnon, MD;  [facility];  Service: Open Heart Surgery;  Laterality: N/A;  . dental implants    . IR THORACENTESIS ASP PLEURAL SPACE W/IMG GUIDE  01/24/2017  . LEFT HEART CATH AND CORONARY ANGIOGRAPHY N/A 12/03/2016   Procedure: Left Heart Cath and Coronary  Angiography;  Surgeon: Tonny Bollmanooper, Michael, MD;  [facility];  Service: Cardiovascular;  Laterality: N/A;  . RIGHT HEART CATH N/A 12/05/2016   Procedure: Right Heart Cath;  Surgeon: Dolores PattyBensimhon, Daniel R, MD;  [facility];  Service: Cardiovascular;  Laterality: N/A;  . TEE WITHOUT CARDIOVERSION N/A 12/08/2016   Procedure: TRANSESOPHAGEAL ECHOCARDIOGRAM (TEE);  Surgeon: Donata ClayVan Trigt, Theron AristaPeter, MD;  [facility];  Service: Open Heart Surgery;  Laterality: N/A;  . TUBAL LIGATION      Family History  Problem Relation Age of Onset  . Heart attack Neg Hx     Social History Social History   Tobacco Use  . Smoking status: Former Smoker    Years: 50.00    Types: Cigarettes    Last attempt to quit: 12/03/2016    Years since quitting: 0.4  . Smokeless tobacco: Never Used  Substance Use Topics  . Alcohol use: No    Comment: Recovering  Alocholic  . Drug use: No    Current Outpatient Medications  Medication Sig Dispense Refill  . aspirin 81 MG tablet Take 1 tablet (81 mg total) daily by mouth.    Marland Kitchen. atorvastatin (LIPITOR) 80 MG tablet Take 1 tablet (80 mg total) by mouth daily at 6 PM. 30 tablet 1  . buPROPion (WELLBUTRIN XL) 300 MG 24 hr tablet Take 300 mg by mouth daily.    . carvedilol (COREG) 6.25 MG tablet Take 1 tablet (6.25 mg  total) by mouth 2 (two) times daily with a meal. 60 tablet 3  . digoxin (LANOXIN) 0.125 MG tablet Take 1 tablet (0.125 mg total) by mouth daily. 90 tablet 3  . furosemide (LASIX) 40 MG tablet Take 1 tab in AM and 1/2 tab in PM 45 tablet 3  . losartan (COZAAR) 25 MG tablet Take 1 tablet (25 mg total) daily by mouth. 90 tablet 3  . mometasone-formoterol (DULERA) 200-5 MCG/ACT AERO Inhale 2 puffs into the lungs 2 (two) times daily.    . potassium chloride SA (K-DUR,KLOR-CON) 20 MEQ tablet Take 3 tablets (60 mEq total) by mouth daily. (Patient taking differently: Take 20-40 mEq by mouth See admin instructions. Take 2 in the morning, and 1 tablet  in night) 270 tablet 3  . sertraline (ZOLOFT) 100 MG tablet Take 200 mg by mouth daily.    Marland Kitchen spironolactone (ALDACTONE) 25 MG tablet Take 1 tablet (25 mg total) by mouth daily. 90 tablet 3   No current facility-administered medications for this visit.     Allergies  Allergen Reactions  . Codeine Hives    Review of Systems  Patient recently moved to a new home. She remains very active  BP 107/65 (BP Location: Left Arm, Patient Position: Sitting, Cuff Size: Normal)   Pulse 81   Resp 16   Ht 5\' 8"  (1.727 m)   Wt 123 lb 12.8 oz (56.2 kg)   SpO2 98% Comment: ON RA  BMI 18.82 kg/m  Physical Exam      Exam    General- alert and comfortable   Lungs- clear without rales, wheezes   Cor- regular rate and rhythm, no murmur , gallop   Abdomen- soft, non-tender   Extremities - warm, non-tender, minimal edema   Neuro- oriented, appropriate, no focal weakness   Diagnostic Tests: Chest x-ray pressure reviewed and is clear  Impression: Excellent resolution of right pleural effusion with Pleurx catheter and increased diuretic dose  Plan:Continue Pleurx  catheter twice a week for now. She is approaching the time when the catheter hopefully can be removed Return in 3 weeks chest x-ray to review progress  Mikey Bussing, MD [facility] [phone]

## 2017-05-07 ENCOUNTER — Ambulatory Visit (HOSPITAL_COMMUNITY)
Admission: RE | Admit: 2017-05-07 | Discharge: 2017-05-07 | Disposition: A | Discharge status: Home / Self Care | Payer: Medicare PPO | Source: Ambulatory Visit | Attending: Cardiology | Admitting: Cardiology

## 2017-05-07 VITALS — BP 118/60 | HR 75 | Wt 124.0 lb

## 2017-05-07 DIAGNOSIS — D509 Iron deficiency anemia, unspecified: Secondary | ICD-10-CM | POA: Diagnosis not present

## 2017-05-07 DIAGNOSIS — J449 Chronic obstructive pulmonary disease, unspecified: Secondary | ICD-10-CM | POA: Insufficient documentation

## 2017-05-07 DIAGNOSIS — Z951 Presence of aortocoronary bypass graft: Secondary | ICD-10-CM | POA: Diagnosis not present

## 2017-05-07 DIAGNOSIS — I5022 Chronic systolic (congestive) heart failure: Secondary | ICD-10-CM | POA: Diagnosis present

## 2017-05-07 DIAGNOSIS — I252 Old myocardial infarction: Secondary | ICD-10-CM | POA: Diagnosis not present

## 2017-05-07 DIAGNOSIS — Z79899 Other long term (current) drug therapy: Secondary | ICD-10-CM | POA: Insufficient documentation

## 2017-05-07 DIAGNOSIS — I739 Peripheral vascular disease, unspecified: Secondary | ICD-10-CM | POA: Insufficient documentation

## 2017-05-07 DIAGNOSIS — Z720 Tobacco use: Secondary | ICD-10-CM | POA: Insufficient documentation

## 2017-05-07 DIAGNOSIS — D649 Anemia, unspecified: Secondary | ICD-10-CM

## 2017-05-07 DIAGNOSIS — I214 Non-ST elevation (NSTEMI) myocardial infarction: Secondary | ICD-10-CM | POA: Insufficient documentation

## 2017-05-07 DIAGNOSIS — I2510 Atherosclerotic heart disease of native coronary artery without angina pectoris: Secondary | ICD-10-CM | POA: Insufficient documentation

## 2017-05-07 LAB — IRON AND TIBC
Iron: 86 ug/dL (ref 28–170)
SATURATION RATIOS: 23 % (ref 10.4–31.8)
TIBC: 367 ug/dL (ref 250–450)
UIBC: 281 ug/dL

## 2017-05-07 LAB — BASIC METABOLIC PANEL
Anion gap: 8 (ref 5–15)
BUN: 13 mg/dL (ref 6–20)
CHLORIDE: 103 mmol/L (ref 101–111)
CO2: 27 mmol/L (ref 22–32)
CREATININE: 0.71 mg/dL (ref 0.44–1.00)
Calcium: 8.6 mg/dL — ABNORMAL LOW (ref 8.9–10.3)
GFR calc Af Amer: >60 mL/min (ref >60)
GFR calc non Af Amer: >60 mL/min (ref >60)
Glucose, Bld: 85 mg/dL (ref 65–99)
POTASSIUM: 4.4 mmol/L (ref 3.5–5.1)
Sodium: 138 mmol/L (ref 135–145)

## 2017-05-07 LAB — FERRITIN: FERRITIN: 64 ng/mL (ref 11–307)

## 2017-05-07 LAB — BRAIN NATRIURETIC PEPTIDE: B Natriuretic Peptide: 384.9 pg/mL — ABNORMAL HIGH (ref 0.0–100.0)

## 2017-05-07 MED ORDER — CARVEDILOL 12.5 MG PO TABS: 12.5000 mg | ORAL_TABLET | Freq: Two times a day (BID) | ORAL | 3 refills | Status: DC

## 2017-05-07 MED ORDER — TRAMADOL HCL 50 MG PO TABS: 50.0000 mg | ORAL_TABLET | ORAL | 0 refills | Status: DC | PRN

## 2017-05-07 NOTE — Patient Instructions (Addendum)
It was great to see you today!  Please INCREASE carvedilol to 12.5 mg (1 tablet) TWICE DAILY. You can take your current carvedilol 6.25 mg tablets but take #2 tablets TWICE DAILY until you fill the higher strength.   Blood work today. We will call you with any abnormalities.   You are scheduled to see Cicero Duck, pharmacist on 06/11/17 at 1:15 PM. Garage code: [id number]   Please keep your appointment with our NP/PA on 07/11/17. Garage code: [phone]

## 2017-05-07 NOTE — Progress Notes (Signed)
HF MD: Latoya Adams  HPI:  Latoya Adams a 71 y.o.Caucasian femalewith history of CAD s/ CABG x 4 12/08/2016, chronic systolic CHF cMRI EF 24% with only small apex scar, Tobacco use, COPD, and chronic anemia.   Pt admitted 12/03/2016 with NSTEMI, Troponin peak 8.5. Taken for urgent cath as below which showed severe multivessel disease and depressed EF. Echo with LVEF ~ 25%. cMRI viability study only showed small scar. Taken for CABG on 12/08/16. Pt recovery was relatively unremarkable. She tolerated adjustment of her HF medications and was sent to SNF on discharge. Discharge weight was 134 lbs.   She presents today for pharmacist-led HF medication titration. At last HF clinic visit on 04/10/17, her losartan was increased to 25 mg daily, her furosemide was increased to 40 qam/20 qpm and her aspirin was reduced to 81 mg daily. Has been feeling great overall. No further dizziness or lightheadedness. She is in the process of moving so feels more fatigued lately. Underwent Pleurex catheter for recurrent R pleural effusion on 03/06/17 and has been documenting twice weekly drainage output which has been trending down. Recently only had 50 cc of drainage. She is still having some itching/burning pain at the bottom of her CABG scar which is stable since surgery.      . Shortness of breath/dyspnea on exertion? Yes - stable  . Orthopnea/PND? no . Edema? no . Lightheadedness/dizziness? no . Daily weights at home? No - in the process of moving so hasn't been able to find scale yet . Blood pressure/heart rate monitoring at home? no . Following low-sodium/fluid-restricted diet? yes  HF Medications: Carvedilol 6.25 mg PO BID Digoxin 0.125 mg PO daily Furosemide 40 mg PO daily Losartan 25 mg PO daily KCl 60 mEq PO daily Spironolactone 25 mg PO daily   Has the patient been experiencing any side effects to the medications prescribed?  no  Does the patient have any problems obtaining medications due to  transportation or finances?   No- [employer]  Understanding of regimen: good Understanding of indications: good Potential of compliance: excellent Patient understands to avoid NSAIDs. Patient understands to avoid decongestants.    Pertinent Lab Values: . 05/07/17: Serum creatinine 0.71, BUN 13, Potassium 4.4, Sodium 138, BNP 384 . 05/07/17 Iron Panel: ferritin 64, tsat 23% . 04/10/17: Digoxin 0.4   Vital Signs: . Weight: 124 lb (dry weight: 120 lb) . Blood pressure: 118/60 mmHg  . Heart rate: 75 bpm    Assessment: 1. Chronicsystolic CHF (EF 53-00% on 04/10/17), due to CAD. NYHA class II-IIIsymptoms.  - Volume status stable today although weight slowly trending up, Pleurx with decreasing output  - Increase carvedilol to 12.5 mg BID  - Continue digoxin 0.125 mg daily, furosemide 40 mg qam/20 mg qpm, losartan 25 mg daily, KCl 60 meq daily and spironolactone 25 mg daily  - Never actually started Entresto 2/2 cost and then concern for her soft BP. If BP ok at next visit, could reconsider switch to Entresto 24-26 mg BID - Basic disease state pathophysiology, medication indication, mechanism and side effects reviewed at length with patient and she verbalized understanding 2. NSTEMI/CAD s/p CABG x 4 (12/08/16)  - Continue on ASA, statin, BB, ARB  - Suspect that her EF will improve now that she is s/p CABG   - Referred for cardiac rehab at [facility] but unable to attend due to need for [facility] for Pluerex 3. PAD  - Stable  - Continue on ASA, statin as above 4. Tobacco use/COPD  -  PFTs show severe COPD on Dulera   - She remains abstinent from cigarettes 5. Recurrent R pleural effusion - s/p Pleurex. Continues to drain and output is trending down (last Thursday only drained 50 cc) - F/u with Dr. Donata ClayVan Trigt on 12/26 for repeat chest xray and possible removal - Tramadol for pain as needed 6. Iron deficiency anemia - Meets definition of IDA with ferritin <100 ng/mL and hgb 10.4 g/dL  and symptomatic with fatigue and low energy - Will forward results to Dr. Gala RomneyBensimhon to see if he would like her to receive IV Feraheme      Plan: 1) Medication changes: Based on clinical presentation, vital signs and recent labs will increase carvedilol to 12.5 mg BID 2) Labs: BMET/BNP today  3) Follow-up: PA/NP on 07/11/17   Cicero DuckErika K. Bonnye FavaNicolsen, PharmD, BCPS, CPP Clinical Pharmacist Pager: [phone] Phone: [phone] 05/07/2017 1:14 PM  Agree with above.   Latoya Adams Briar Witherspoon, MD  4:44 PM

## 2017-05-18 ENCOUNTER — Telehealth: Payer: Self-pay

## 2017-05-18 NOTE — Telephone Encounter (Signed)
Olegario Messier from [employer] called to let us know that they only drained the patients tube once because of the snow.  Patient declined second visit.  Last drained 05/10/2017 84ml, 05/16/2017 .  Will drain again Monday.

## 2017-05-23 ENCOUNTER — Encounter: Payer: Medicare PPO | Admitting: Cardiothoracic Surgery

## 2017-05-24 ENCOUNTER — Other Ambulatory Visit: Payer: Self-pay | Admitting: Cardiothoracic Surgery

## 2017-05-24 DIAGNOSIS — Z951 Presence of aortocoronary bypass graft: Secondary | ICD-10-CM

## 2017-05-30 ENCOUNTER — Encounter: Payer: Medicare PPO | Admitting: Cardiothoracic Surgery

## 2017-06-01 ENCOUNTER — Ambulatory Visit
Admission: RE | Admit: 2017-06-01 | Discharge: 2017-06-01 | Disposition: A | Discharge status: Home / Self Care | Payer: Medicare PPO | Source: Ambulatory Visit | Attending: Cardiothoracic Surgery | Admitting: Cardiothoracic Surgery

## 2017-06-01 ENCOUNTER — Ambulatory Visit (INDEPENDENT_AMBULATORY_CARE_PROVIDER_SITE_OTHER): Payer: Medicare PPO | Admitting: Cardiothoracic Surgery

## 2017-06-01 ENCOUNTER — Other Ambulatory Visit: Payer: Self-pay

## 2017-06-01 ENCOUNTER — Encounter: Payer: Self-pay | Admitting: Cardiothoracic Surgery

## 2017-06-01 VITALS — BP 116/64 | HR 89 | Resp 18 | Ht 68.0 in | Wt 125.6 lb

## 2017-06-01 DIAGNOSIS — Z951 Presence of aortocoronary bypass graft: Secondary | ICD-10-CM

## 2017-06-01 DIAGNOSIS — J90 Pleural effusion, not elsewhere classified: Secondary | ICD-10-CM | POA: Diagnosis not present

## 2017-06-02 ENCOUNTER — Encounter: Payer: Self-pay | Admitting: Cardiothoracic Surgery

## 2017-06-02 NOTE — Progress Notes (Signed)
PCP is Carmin Richmond, MD Referring Provider is Tonny Bollman, MD  Chief Complaint  Patient presents with  . Follow-up    pleurx catheter, s/p CABG x4    HPI: Pleurx catheter check. 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 With optimize medical therapy of her ischemic cardiomyopathy her drainage from Pleurx catheter has been scant for the past 2 weeks. She will be scheduled have the Pleurx catheter removed in early January. Chest x-ray today shows no significant right pleural effusion. She has minimal edema on exam.  Past Medical History:  Diagnosis Date  . Alcohol abuse   . Anxiety   . Depression   . Dyspnea    due to Pulmonary Effusion  . History of blood transfusion 2018   during CABG  . Hypertension   . Myocardial infarction [facility]) 12/2016    Past Surgical History:  Procedure Laterality Date  . CHEST TUBE INSERTION Right 03/06/2017   Procedure: INSERTION PLEURAL DRAINAGE CATHETER;  Surgeon: Kerin Perna, MD;  Location: [facility] OR;  Service: Thoracic;  Laterality: Right;  . COLONOSCOPY    . CORONARY ARTERY BYPASS GRAFT N/A 12/08/2016   Procedure: CORONARY ARTERY BYPASS GRAFTING (CABG)x4 using left internal mammary artery and bilateral greater saphenous veins harvested endoscopically;  Surgeon: Kerin Perna, MD;  Location: [facility] OR;  Service: Open Heart Surgery;  Laterality: N/A;  . dental implants    . IR THORACENTESIS ASP PLEURAL SPACE W/IMG GUIDE  01/24/2017  . LEFT HEART CATH AND CORONARY ANGIOGRAPHY N/A 12/03/2016   Procedure: Left Heart Cath and Coronary Angiography;  Surgeon: Tonny Bollman, MD;  Location: [facility] INVASIVE CV LAB;  Service: Cardiovascular;  Laterality: N/A;  . RIGHT HEART CATH N/A 12/05/2016   Procedure: Right Heart Cath;  Surgeon: Dolores Patty, MD;  Location: [facility] INVASIVE CV LAB;  Service: Cardiovascular;  Laterality: N/A;  . TEE WITHOUT CARDIOVERSION N/A 12/08/2016   Procedure: TRANSESOPHAGEAL ECHOCARDIOGRAM (TEE);  Surgeon: Donata Clay, Theron Arista, MD;  Location: [facility] OR;  Service: Open Heart Surgery;  Laterality: N/A;  . TUBAL LIGATION      Family History  Problem Relation Age of Onset  . Heart attack Neg Hx     Social History Social History   Tobacco Use  . Smoking status: Former Smoker    Years: 50.00    Types: Cigarettes    Last attempt to quit: 12/03/2016    Years since quitting: 0.4  . Smokeless tobacco: Never Used  Substance Use Topics  . Alcohol use: No    Comment: Recovering  Alocholic  . Drug use: No    Current Outpatient Medications  Medication Sig Dispense Refill  . aspirin 81 MG tablet Take 1 tablet (81 mg total) daily by mouth.    Marland Kitchen atorvastatin (LIPITOR) 80 MG tablet Take 1 tablet (80 mg total) by mouth daily at 6 PM. 30 tablet 1  . buPROPion (WELLBUTRIN XL) 300 MG 24 hr tablet Take 300 mg by mouth daily.    . carvedilol (COREG) 12.5 MG tablet Take 1 tablet (12.5 mg total) by mouth 2 (two) times daily. 180 tablet 3  . digoxin (LANOXIN) 0.125 MG tablet Take 1 tablet (0.125 mg total) by mouth daily. 90 tablet 3  . furosemide (LASIX) 40 MG tablet Take 1 tab in AM and 1/2 tab in PM 45 tablet 3  . losartan (COZAAR) 25 MG tablet Take 1 tablet (25 mg total) daily by mouth. 90 tablet 3  .  potassium chloride SA (K-DUR,KLOR-CON) 20 MEQ tablet Take 40 meq (2 tablets) in the morning and 20 meq (1 tablet) in the evening.    . sertraline (ZOLOFT) 100 MG tablet Take 200 mg by mouth daily.    Marland Kitchen. spironolactone (ALDACTONE) 25 MG tablet Take 1 tablet (25 mg total) by mouth daily. 90 tablet 3  . traMADol (ULTRAM) 50 MG tablet Take 1-2 tablets (50-100 mg total) by mouth every 4 (four) hours as needed for moderate pain. 30 tablet 0   No current facility-administered medications for this visit.     Allergies  Allergen Reactions  . Codeine Hives    Review of Systems  Overall feels great Minimal symptoms of heart failure Pleurx catheter site clean  and dry  BP 116/64 (BP Location: Left Arm, Patient Position: Sitting, Cuff Size: Normal)   Pulse 89   Resp 18   Ht 5\' 8"  (1.727 m)   Wt 125 lb 9.6 oz (57 kg)   SpO2 98% Comment: on RA  BMI 19.10 kg/m  Physical Exam      Exam    General- alert and comfortable   Lungs- clear without rales, wheezes   Cor- regular rate and rhythm, no murmur , gallop   Abdomen- soft, non-tender   Extremities - warm, non-tender, minimal edema   Neuro- oriented, appropriate, no focal weakness  Diagnostic Tests: Chest x-ray clear Pleurx catheter in good position Impression: Resolution of recurrent right pleural effusion secondary to heart failure  Plan: Pleurx catheter to be removed at short stay treatment room in early January.  Mikey BussingPeter Van Trigt III, MD [facility] [phone]

## 2017-06-04 ENCOUNTER — Other Ambulatory Visit: Payer: Self-pay | Admitting: *Deleted

## 2017-06-11 ENCOUNTER — Ambulatory Visit (HOSPITAL_COMMUNITY)
Admission: RE | Admit: 2017-06-11 | Discharge: 2017-06-11 | Disposition: A | Discharge status: Home / Self Care | Payer: Medicare PPO | Source: Ambulatory Visit | Attending: Cardiology | Admitting: Cardiology

## 2017-06-11 DIAGNOSIS — J449 Chronic obstructive pulmonary disease, unspecified: Secondary | ICD-10-CM | POA: Insufficient documentation

## 2017-06-11 DIAGNOSIS — I739 Peripheral vascular disease, unspecified: Secondary | ICD-10-CM | POA: Insufficient documentation

## 2017-06-11 DIAGNOSIS — J984 Other disorders of lung: Secondary | ICD-10-CM | POA: Diagnosis not present

## 2017-06-11 DIAGNOSIS — I252 Old myocardial infarction: Secondary | ICD-10-CM | POA: Diagnosis not present

## 2017-06-11 DIAGNOSIS — D509 Iron deficiency anemia, unspecified: Secondary | ICD-10-CM | POA: Diagnosis not present

## 2017-06-11 DIAGNOSIS — I5022 Chronic systolic (congestive) heart failure: Secondary | ICD-10-CM | POA: Diagnosis not present

## 2017-06-11 DIAGNOSIS — I2510 Atherosclerotic heart disease of native coronary artery without angina pectoris: Secondary | ICD-10-CM | POA: Insufficient documentation

## 2017-06-11 DIAGNOSIS — Z951 Presence of aortocoronary bypass graft: Secondary | ICD-10-CM | POA: Insufficient documentation

## 2017-06-11 DIAGNOSIS — Z720 Tobacco use: Secondary | ICD-10-CM | POA: Insufficient documentation

## 2017-06-11 MED ORDER — FUROSEMIDE 40 MG PO TABS: 20.0000 mg | ORAL_TABLET | ORAL | 3 refills | Status: DC

## 2017-06-11 MED ORDER — SACUBITRIL-VALSARTAN 24-26 MG PO TABS: 1.0000 | ORAL_TABLET | Freq: Two times a day (BID) | ORAL | 3 refills | Status: DC

## 2017-06-11 NOTE — Patient Instructions (Addendum)
It was great to see you!  Stop taking your losartan and begin taking Entresto 24-26mg  twice daily.  Continue taking your carvedilol 12.5mg  twice daily, digoxin 0.125mg  daily, spironolactone 25mg  daily, and Lasix 40mg  in the morning and 20mg  in the afternoon.  Return to clinic for follow-up 07/11/17.

## 2017-06-11 NOTE — Progress Notes (Signed)
HF MD: [name]  HPI:   Latoya Adams a 72 y.o.Caucasianfemalewith history of CAD s/p CABG x4 12/08/2016, chronic systolic CHF cMRI EF 24% with only small apex scar, tobacco use, COPD, and chronic anemia.   Pt admitted 12/03/2016 with NSTEMI, Troponin peak 8.5. Taken for urgent cath as below which showed severe multivessel disease and depressed EF. Echo with LVEF ~ 25%. cMRI viability study only showed small scar. Taken for CABG on 12/08/16. Pt recovery was relatively unremarkable. She tolerated adjustment of her HF medications and was sent to SNF on discharge. Discharge weight was 134 lbs.   She presents todayfor pharmacist-led HF medication titration.At last HF clinic visit on 05/07/17, her carvedilol was increased to 12.5mg  BID and pt endorses compliance with this. Pt denies dizziness, falls, fatigue since carvedilol dose increase. Pt to have Pleurex catheter removed tomorrow. She reports slight weight increase over holidays and ankles demonstrate 1+ edema. Pt denies any symptoms of SOB or orthopnea.   Shortness of breath/dyspnea on exertion? no   Orthopnea/PND? no  Edema? Yes - 1+ edema on ankles  Lightheadedness/dizziness? no  Daily weights at home? yes  Blood pressure/heart rate monitoring at home? no  Following low-sodium/fluid-restricted diet? yes  HF Medications: Carvedilol12.5mg  PO BID Digoxin 0.125 mg PO daily Furosemide 40 mg qAM and 20mg  qPM -- takes additional 20 mg in PM if weight gain or ankle edema in the AM Losartan25mg  PO daily KCl 60 mEq PO daily Spironolactone 25 mg PO daily  Has the patient been experiencing any side effects to the medications prescribed?  no  Does the patient have any problems obtaining medications due to transportation or finances?   Previously had trouble affording Entresto, but this has been approved through [facility] through 01/2019 -- PANF also open now so can enroll her if needed   Understanding of regimen:  good Understanding of indications: good Potential of compliance: excellent Patient understands to avoid NSAIDs. Patient understands to avoid decongestants.   Pertinent Lab Values:  05/07/17: serum creatinine 0.71, CO2 27, Potassium 4.4, Sodium 138, BNP 384.9  05/07/17 Iron Panel:ferritin 64, tsat 23%  04/10/17: Digoxin 0.4 ng/dl   Vital Signs:  Weight: 125 lbs (dry weight: 120 lbs)  Blood pressure: 112/58 mmHg   Heart rate: 85 bpm   Assessment: 1. Chronicsystolic CHF (EF20-25%on 04/10/17), due toCAD. NYHA classII-IIIsymptoms. - Volume status stabletoday although weight slowly trending up and mild bilateral LEE noted, Pleurex to be removed tomorrow 06/12/17 - Stop losartan and begin Entresto 24-26mg  BID. BP should tolerate now and neprilysin inhibition will help augment diuresis with upcoming Pleurex removal  - Continue carvedilol 12.5mg  BID, digoxin 0.125 mg daily, furosemide 40 mgqam/20 mg qpm (with additional 20 mg QPM PRN weight gain/LE edema), KCl 60 meq daily and spironolactone 25 mg daily  - Consider uptitration of Entresto or carvedilol at next visit as BP will tolerate. - Basic disease state pathophysiology, medication indication, mechanism and side effects reviewed at length with patient andshe verbalized understanding 2. NSTEMI/CADs/p CABG x 4 (12/08/16) - Continue on ASA, statin, BB - Suspect that her EF will improve now that she is s/p CABG  - [facility] but unable to [facility] due to need for [facility] for Pluerex. Pt hopeful to begin soon with upcoming Pleurex removal 3. PAD - Stable - continue on ASA, statinas above 4. Tobacco use/COPD -PFTs show severe COPD -on Dulera - She remains abstinent from cigarettes 5. Recurrent R pleural effusion -s/p Pleurex with minimal drainage - Pleurex removal  planned for tomorrow 06/12/17 per Dr. Donata Clay -Tramadol for pain as needed 6. Iron deficiency anemia - Meets  definition of IDA with ferritin <100 ng/mL and hgb 10.4 g/dL and symptomatic with fatigue and low energy - Will forward results to Dr. [name] to see if he would like her to receive IV Feraheme  Plan: 1) Medication changes: Based on clinical presentation, vital signs and recent labs will stop losartan and initiate Entresto 24-26mg  BID. 2) Labs: BMET at F/U 07/11/17 3) Follow-up: PA/NP on 07/11/17  Fredonia Highland, PharmD, BCPS PGY-2 Cardiology Pharmacy Resident Pager: [phone] 06/11/2017  Patient seen with PGY-2 Resident.  Tyler Deis. Bonnye Fava, PharmD, BCPS, CPP Clinical Pharmacist Phone: [phone] 06/11/2017 2:25 PM  Agree with addition of Entresto and treatment with feraheme. Pleurex to be removed tomorrow.   Arvilla Meres, MD  10:46 PM

## 2017-06-12 ENCOUNTER — Encounter (HOSPITAL_COMMUNITY)
Admission: RE | Disposition: A | Discharge status: Home / Self Care | Payer: Self-pay | Source: Ambulatory Visit | Attending: Cardiothoracic Surgery

## 2017-06-12 ENCOUNTER — Ambulatory Visit (HOSPITAL_COMMUNITY)
Admission: RE | Admit: 2017-06-12 | Discharge: 2017-06-12 | Disposition: A | Discharge status: Home / Self Care | Payer: Medicare PPO | Source: Ambulatory Visit | Attending: Cardiothoracic Surgery | Admitting: Cardiothoracic Surgery

## 2017-06-12 DIAGNOSIS — Z4803 Encounter for change or removal of drains: Secondary | ICD-10-CM | POA: Diagnosis present

## 2017-06-12 DIAGNOSIS — J90 Pleural effusion, not elsewhere classified: Secondary | ICD-10-CM | POA: Diagnosis not present

## 2017-06-12 HISTORY — PX: REMOVAL OF PLEURAL DRAINAGE CATHETER: SHX5080

## 2017-06-12 SURGERY — REMOVAL, CLOSED DRAINAGE CATHETER SYSTEM, PLEURAL
Anesthesia: Monitor Anesthesia Care | Laterality: Right

## 2017-06-12 MED ORDER — LIDOCAINE HCL 2 % IJ SOLN
INTRAMUSCULAR | Status: AC
  Filled 2017-06-12: qty 20

## 2017-06-12 MED ORDER — LIDOCAINE HCL (PF) 1 % IJ SOLN
INTRAMUSCULAR | Status: AC
  Filled 2017-06-12: qty 30

## 2017-06-12 NOTE — Progress Notes (Signed)
Voices understanding of follow up appointment and discharge instructions. No c/o pain voiced at this time.

## 2017-06-12 NOTE — Progress Notes (Signed)
Patient presents today to have right Pleur X catheter removed. After proper consent was obtained, right Pleur X catheter was drained using a bottle patient brought from home. Approximately 75 cc of light yellow fluid was removed. Patient is unsure when it was last drained but only got 25 cc at that time. Right anterior chest was prepped with Betadine. Xylocaine was used to achieve local anesthetic. The cuff of the Pleur X was carefully dissected out. Right Pleur X catheter was removed and patient tolerated the procedure well. There was minor bloody ooze after ward and pressure was held. 2 Nylon sutures were placed followed by 2x2s with tape. Patient was instructed she may removed the dressing and shower in the am. Also, if she develops shortness of breath or increasing right sided pleural pain, she is to go to ER asap. She has a follow up appointment on 06/20/2017.

## 2017-06-13 ENCOUNTER — Encounter (HOSPITAL_COMMUNITY): Payer: Self-pay | Admitting: Cardiothoracic Surgery

## 2017-06-13 ENCOUNTER — Other Ambulatory Visit (HOSPITAL_COMMUNITY): Payer: Self-pay | Admitting: Pharmacist

## 2017-06-13 MED ORDER — SACUBITRIL-VALSARTAN 24-26 MG PO TABS: 1.0000 | ORAL_TABLET | Freq: Two times a day (BID) | ORAL | 3 refills | Status: DC

## 2017-06-13 NOTE — Telephone Encounter (Signed)
[employer] copay for 90 day supply of Sherryll Burger is $131 so will enroll Ms. Goren in PANF so that she will have $1000 to use toward her Entresto copays through 06/12/18. Relayed info to [employer] in [place].   Member ID: [id number] Group ID: [id number] RxBin ID: [id number] PCN: [id number] Eligibility Start Date: 03/15/2017 Eligibility End Date: 06/12/2018 Assistance Amount: $1,000.00   Cicero Duck K. Bonnye Fava, PharmD, BCPS, CPP Clinical Pharmacist Phone: [phone] 06/13/2017 9:20 AM

## 2017-06-15 ENCOUNTER — Other Ambulatory Visit (HOSPITAL_COMMUNITY): Payer: Self-pay | Admitting: Pharmacist

## 2017-06-15 DIAGNOSIS — D509 Iron deficiency anemia, unspecified: Secondary | ICD-10-CM

## 2017-06-15 MED ORDER — SODIUM CHLORIDE 0.9 % IV SOLN
510.0000 mg | Freq: Once | INTRAVENOUS | Status: DC
  Filled 2017-06-15: qty 17

## 2017-06-18 ENCOUNTER — Telehealth (HOSPITAL_COMMUNITY): Payer: Self-pay

## 2017-06-18 NOTE — Telephone Encounter (Signed)
  Advanced Heart Failure Triage Encounter  Patient Name: Latoya Adams  Date of Call: 06/18/17  Problem:  Pt wants to have Nitro ordered just in case.   Plan:    Teresa Coombs, RN

## 2017-06-19 NOTE — Telephone Encounter (Signed)
Please order sublingual nitroglycerin.   Kathrynn Backstrom NP-C  1:46 PM

## 2017-06-20 ENCOUNTER — Other Ambulatory Visit: Payer: Self-pay

## 2017-06-20 ENCOUNTER — Ambulatory Visit (INDEPENDENT_AMBULATORY_CARE_PROVIDER_SITE_OTHER): Payer: Self-pay

## 2017-06-20 DIAGNOSIS — Z4802 Encounter for removal of sutures: Secondary | ICD-10-CM

## 2017-06-20 NOTE — Progress Notes (Signed)
Patient arrived for nurse visit to remove 2 suture post- procedure.  Sutures removed from pleurX drainage incision site and cleaned with no signs/ symptoms of infection noted.  Patient tolerated procedure well.  Patient instructed to keep the incision sites clean and dry.  Patient acknowledged instructions given.

## 2017-06-21 ENCOUNTER — Other Ambulatory Visit (HOSPITAL_COMMUNITY): Payer: Self-pay

## 2017-06-21 MED ORDER — NITROGLYCERIN 0.4 MG SL SUBL: 0.4000 mg | SUBLINGUAL_TABLET | SUBLINGUAL | 3 refills | Status: DC | PRN

## 2017-06-22 ENCOUNTER — Ambulatory Visit (HOSPITAL_COMMUNITY)
Admission: RE | Admit: 2017-06-22 | Discharge: 2017-06-22 | Disposition: A | Discharge status: Home / Self Care | Payer: Medicare PPO | Source: Ambulatory Visit | Attending: Internal Medicine | Admitting: Internal Medicine

## 2017-06-22 DIAGNOSIS — D509 Iron deficiency anemia, unspecified: Secondary | ICD-10-CM | POA: Insufficient documentation

## 2017-06-22 MED ORDER — SODIUM CHLORIDE 0.9 % IV SOLN
510.0000 mg | Freq: Once | INTRAVENOUS | Status: AC
  Administered 2017-06-22: 510 mg via INTRAVENOUS
  Filled 2017-06-22: qty 17

## 2017-06-22 NOTE — Discharge Instructions (Signed)

## 2017-07-11 ENCOUNTER — Ambulatory Visit (HOSPITAL_COMMUNITY)
Admission: RE | Admit: 2017-07-11 | Discharge: 2017-07-11 | Disposition: A | Discharge status: Home / Self Care | Payer: Medicare PPO | Source: Ambulatory Visit | Attending: Student | Admitting: Student

## 2017-07-11 ENCOUNTER — Encounter (HOSPITAL_COMMUNITY): Payer: Self-pay

## 2017-07-11 ENCOUNTER — Ambulatory Visit (HOSPITAL_COMMUNITY)
Admission: RE | Admit: 2017-07-11 | Discharge: 2017-07-11 | Disposition: A | Discharge status: Home / Self Care | Payer: Medicare PPO | Source: Ambulatory Visit | Attending: Internal Medicine | Admitting: Internal Medicine

## 2017-07-11 VITALS — BP 98/80 | HR 80 | Wt 128.0 lb

## 2017-07-11 DIAGNOSIS — I110 Hypertensive heart disease with heart failure: Secondary | ICD-10-CM | POA: Diagnosis present

## 2017-07-11 DIAGNOSIS — J90 Pleural effusion, not elsewhere classified: Secondary | ICD-10-CM | POA: Insufficient documentation

## 2017-07-11 DIAGNOSIS — I252 Old myocardial infarction: Secondary | ICD-10-CM | POA: Diagnosis not present

## 2017-07-11 DIAGNOSIS — Z951 Presence of aortocoronary bypass graft: Secondary | ICD-10-CM | POA: Diagnosis not present

## 2017-07-11 DIAGNOSIS — Z87891 Personal history of nicotine dependence: Secondary | ICD-10-CM | POA: Diagnosis not present

## 2017-07-11 DIAGNOSIS — I214 Non-ST elevation (NSTEMI) myocardial infarction: Secondary | ICD-10-CM | POA: Insufficient documentation

## 2017-07-11 DIAGNOSIS — Z79899 Other long term (current) drug therapy: Secondary | ICD-10-CM | POA: Diagnosis not present

## 2017-07-11 DIAGNOSIS — J449 Chronic obstructive pulmonary disease, unspecified: Secondary | ICD-10-CM | POA: Insufficient documentation

## 2017-07-11 DIAGNOSIS — I5022 Chronic systolic (congestive) heart failure: Secondary | ICD-10-CM | POA: Diagnosis not present

## 2017-07-11 DIAGNOSIS — I2510 Atherosclerotic heart disease of native coronary artery without angina pectoris: Secondary | ICD-10-CM | POA: Diagnosis not present

## 2017-07-11 DIAGNOSIS — I739 Peripheral vascular disease, unspecified: Secondary | ICD-10-CM | POA: Insufficient documentation

## 2017-07-11 DIAGNOSIS — I10 Essential (primary) hypertension: Secondary | ICD-10-CM

## 2017-07-11 LAB — BASIC METABOLIC PANEL
ANION GAP: 11 (ref 5–15)
BUN: 12 mg/dL (ref 6–20)
CO2: 25 mmol/L (ref 22–32)
Calcium: 8.8 mg/dL — ABNORMAL LOW (ref 8.9–10.3)
Chloride: 101 mmol/L (ref 101–111)
Creatinine, Ser: 0.79 mg/dL (ref 0.44–1.00)
GFR calc Af Amer: >60 mL/min (ref >60)
GFR calc non Af Amer: >60 mL/min (ref >60)
GLUCOSE: 108 mg/dL — AB (ref 65–99)
POTASSIUM: 4.3 mmol/L (ref 3.5–5.1)
Sodium: 137 mmol/L (ref 135–145)

## 2017-07-11 MED ORDER — FUROSEMIDE 40 MG PO TABS: 40.0000 mg | ORAL_TABLET | Freq: Every day | ORAL | 3 refills | Status: DC

## 2017-07-11 MED ORDER — CARVEDILOL 12.5 MG PO TABS: 12.5000 mg | ORAL_TABLET | Freq: Two times a day (BID) | ORAL | 3 refills | Status: DC

## 2017-07-11 NOTE — Patient Instructions (Signed)
Routine lab work today. Will notify you of abnormal results, otherwise no news is good news!  HOLD Lasix for one day, then reduce daily dose to 40 mg (1 tab) once every morning.  Chest xray today. We will call you with the results within 24 hours.  Follow up 2 weeks with Otilio Saber PA-C.  _______________________________________________________________ [place] Code: 9001  Take all medication as prescribed the day of your appointment. Bring all medications with you to your appointment.  Do the following things EVERYDAY: 1) Weigh yourself in the morning before breakfast. Write it down and keep it in a log. 2) Take your medicines as prescribed 3) Eat low salt foods-Limit salt (sodium) to 2000 mg per day.  4) Stay as active as you can everyday 5) Limit all fluids for the day to less than 2 liters

## 2017-07-11 NOTE — Progress Notes (Signed)
Advanced Heart Failure Clinic Note   Primary Care: Dr. Lois Huxley in [facility] PCP-Cardiologist: Tonny Bollman, MD  HF: Dr. [name]   HPI:  YAHAIRA Adams is a 72 y.o. female with history of CAD s/ CABG x 4 12/08/2016, chronic systolic CHF cMRI EF 24% with only small apex scar, Tobacco use, COPD, and chronic anemia.   Pt admitted 12/03/2016 with NSTEMI, Troponin peak 8.5. Taken for urgent cath as below which showed severe multivessel disease and depressed EF. Echo with LVEF ~ 25%. cMRI viability study only showed small apical scar. Taken for CABG on 12/08/16. Pt recovery was relatively unremarkable. She tolerated adjustment of her HF medications and was sent to SNF on discharge. Discharge weight was 134 lbs.   Underwent Pleurex catheter for recurrent R pleural effusion on 03/06/17. Removed 06/12/2017  She presents today for regular follow up. At last visit losartan switched to [facility]. She has overall felt OK, but feels lightheadedness with rapid standing. + Orthostatics on arrival to clinic today. She has otherwise been doing well, with no SOB in cardiac rehab. She does have mild leg fatigue with exercise.  Walking on treadmill without difficult.  Still having occasional pedal edema but not since Entresto.   Echo 04/2017 EF 20-25% RV normal moderate MR  Review of systems complete and found to be negative unless listed in HPI.    Past Medical History:  Diagnosis Date  . Alcohol abuse   . Anxiety   . Depression   . Dyspnea    due to Pulmonary Effusion  . History of blood transfusion 2018   during CABG  . Hypertension   . Myocardial infarction [facility] 12/2016    Current Outpatient Medications  Medication Sig Dispense Refill  . aspirin 81 MG tablet Take 1 tablet (81 mg total) daily by mouth.    Marland Kitchen atorvastatin (LIPITOR) 80 MG tablet Take 1 tablet (80 mg total) by mouth daily at 6 PM. 30 tablet 1  . buPROPion (WELLBUTRIN XL) 300 MG 24 hr tablet Take 300 mg by mouth daily.    .  carvedilol (COREG) 12.5 MG tablet Take 1 tablet (12.5 mg total) by mouth 2 (two) times daily. 180 tablet 3  . digoxin (LANOXIN) 0.125 MG tablet Take 1 tablet (0.125 mg total) by mouth daily. 90 tablet 3  . furosemide (LASIX) 40 MG tablet Take 0.5-1 tablets (20-40 mg total) by mouth See admin instructions. Take 40 mg by mouth in the morning and take 20 mg by mouth in the evening 135 tablet 3  . nitroGLYCERIN (NITROSTAT) 0.4 MG SL tablet Place 1 tablet (0.4 mg total) under the tongue every 5 (five) minutes as needed for chest pain. 15 tablet 3  . potassium chloride SA (K-DUR,KLOR-CON) 20 MEQ tablet Take 20-40 mEq by mouth See admin instructions. Take 40 meq (2 tablets) in the morning and 20 meq (1 tablet) in the evening.     . sacubitril-valsartan (ENTRESTO) 24-26 MG Take 1 tablet by mouth 2 (two) times daily. 180 tablet 3  . sertraline (ZOLOFT) 100 MG tablet Take 200 mg by mouth daily.    Marland Kitchen spironolactone (ALDACTONE) 25 MG tablet Take 1 tablet (25 mg total) by mouth daily. 90 tablet 3  . traMADol (ULTRAM) 50 MG tablet Take 1-2 tablets (50-100 mg total) by mouth every 4 (four) hours as needed for moderate pain. 30 tablet 0   No current facility-administered medications for this encounter.    Allergies  Allergen Reactions  . Codeine Hives  Social History   Socioeconomic History  . Marital status: Single    Spouse name: Not on file  . Number of children: Not on file  . Years of education: Not on file  . Highest education level: Not on file  Social Needs  . Financial resource strain: Not on file  . Food insecurity - worry: Not on file  . Food insecurity - inability: Not on file  . Transportation needs - medical: Not on file  . Transportation needs - non-medical: Not on file  Occupational History  . Not on file  Tobacco Use  . Smoking status: Former Smoker    Years: 50.00    Types: Cigarettes    Last attempt to quit: 12/03/2016    Years since quitting: 0.6  . Smokeless tobacco: Never  Used  Substance and Sexual Activity  . Alcohol use: No    Comment: Recovering  Alocholic  . Drug use: No  . Sexual activity: Not on file  Other Topics Concern  . Not on file  Social History Narrative  . Not on file   Family History  Problem Relation Age of Onset  . Heart attack Neg Hx    There were no vitals filed for this visit.   Wt Readings from Last 3 Encounters:  06/22/17 124 lb 6 oz (56.4 kg)  06/12/17 125 lb 3.2 oz (56.8 kg)  06/11/17 125 lb 3.2 oz (56.8 kg)   PHYSICAL EXAM: General: Well appearing. No resp difficulty. HEENT: Normal Neck: Supple. JVP 5-6. Carotids 2+ bilat; no bruits. No thyromegaly or nodule noted. Cor: PMI nondisplaced. RRR, No M/G/R noted Lungs: CTAB, normal effort. No obvious dull sounds or effusion Abdomen: Soft, non-tender, non-distended, no HSM. No bruits or masses. +BS  Extremities: No cyanosis, clubbing, or rash. R and LLE no edema.  Neuro: Alert & orientedx3, cranial nerves grossly intact. moves all 4 extremities w/o difficulty. Affect pleasant   ASSESSMENT & PLAN:  1. Chronic systolic HF - Pre-op echo (7/18) EF 20-25% with normal RV - Pre-op cMRI (7/18) EF 24% with only small scar in apex. RHC with normal CO and no PAH - s/p CABG x 4. 12/08/16 - Echo 04/2017 EF 20-25%. We are hopeful that EF will improve so will not refer for ICD yet until meds better titrqted - NYHA II symptoms - Volume status stable to dry. ReDs vest 34%, but orthostatic on arrival to clinic.  - Continue dig 0.125 mg daily. - Continue carvedilol 3.125 mg twice a day.  - Hold lasix x 1 day and then decrease to 40 mg daily.  - Continue Entresto 24/26 mg BID. BMET today.  - Continue spiro 25 mg daily.  - Reinforced fluid restriction to < 2 L daily, sodium restriction to less than 2000 mg daily, and the importance of daily weights.    2. NSTEMI/CAD -- Critical pLAD disease with ulcerated plaque at bifurcation with D1 as well as RPDA 90% lesion.  RHC ok. PFTs with  severe COPD. Carotid u/s ok.  -- S/P CABG x4 on 12/08/16 . On ASA and statin.  - No s/s of ischemia.    - Continue cardiac rehab.  - Continue ASA 81 mg daily  3. Recurrent R pleural effusion - s/p Pleurex removal. Doing well. Clear on exam.  - Follows with Dr. Donata Clay - will repeat CXR.   4. PAD - Pt has known "severe aorto-iliac disease", per notes, though having trouble finding imaging in chart.  - Having leg fatigue with  treadmill. If continues will check ABIs  5. Tobacco use/COPD - PFTs show severe COPD.  - She remains abstinent from cigarettes. Congratulated.   Close 2 week follow up. Pt to call if remains dizzy. CXR today. Discussed all above with Dr. Mort Sawyers, PA-C 07/11/17   Greater than 50% of the 25 minute visit was spent in counseling/coordination of care regarding disease state education, salt/fluid restriction, sliding scale diuretics, and medication compliance.

## 2017-07-25 ENCOUNTER — Ambulatory Visit (HOSPITAL_COMMUNITY)
Admission: RE | Admit: 2017-07-25 | Discharge: 2017-07-25 | Disposition: A | Discharge status: Home / Self Care | Payer: Medicare PPO | Source: Ambulatory Visit | Attending: Internal Medicine | Admitting: Internal Medicine

## 2017-07-25 ENCOUNTER — Encounter (HOSPITAL_COMMUNITY): Payer: Self-pay

## 2017-07-25 VITALS — BP 120/58 | HR 79 | Wt 130.2 lb

## 2017-07-25 DIAGNOSIS — I2510 Atherosclerotic heart disease of native coronary artery without angina pectoris: Secondary | ICD-10-CM | POA: Diagnosis not present

## 2017-07-25 DIAGNOSIS — D649 Anemia, unspecified: Secondary | ICD-10-CM | POA: Diagnosis not present

## 2017-07-25 DIAGNOSIS — Z79891 Long term (current) use of opiate analgesic: Secondary | ICD-10-CM | POA: Diagnosis not present

## 2017-07-25 DIAGNOSIS — R42 Dizziness and giddiness: Secondary | ICD-10-CM | POA: Diagnosis not present

## 2017-07-25 DIAGNOSIS — I5022 Chronic systolic (congestive) heart failure: Secondary | ICD-10-CM | POA: Diagnosis not present

## 2017-07-25 DIAGNOSIS — Z885 Allergy status to narcotic agent status: Secondary | ICD-10-CM | POA: Diagnosis not present

## 2017-07-25 DIAGNOSIS — I110 Hypertensive heart disease with heart failure: Secondary | ICD-10-CM | POA: Insufficient documentation

## 2017-07-25 DIAGNOSIS — J449 Chronic obstructive pulmonary disease, unspecified: Secondary | ICD-10-CM | POA: Insufficient documentation

## 2017-07-25 DIAGNOSIS — I214 Non-ST elevation (NSTEMI) myocardial infarction: Secondary | ICD-10-CM | POA: Diagnosis not present

## 2017-07-25 DIAGNOSIS — I10 Essential (primary) hypertension: Secondary | ICD-10-CM

## 2017-07-25 DIAGNOSIS — J90 Pleural effusion, not elsewhere classified: Secondary | ICD-10-CM | POA: Insufficient documentation

## 2017-07-25 DIAGNOSIS — Z951 Presence of aortocoronary bypass graft: Secondary | ICD-10-CM | POA: Insufficient documentation

## 2017-07-25 DIAGNOSIS — Z7982 Long term (current) use of aspirin: Secondary | ICD-10-CM | POA: Insufficient documentation

## 2017-07-25 DIAGNOSIS — F419 Anxiety disorder, unspecified: Secondary | ICD-10-CM | POA: Diagnosis not present

## 2017-07-25 DIAGNOSIS — F329 Major depressive disorder, single episode, unspecified: Secondary | ICD-10-CM | POA: Diagnosis not present

## 2017-07-25 DIAGNOSIS — I252 Old myocardial infarction: Secondary | ICD-10-CM | POA: Diagnosis not present

## 2017-07-25 DIAGNOSIS — Z79899 Other long term (current) drug therapy: Secondary | ICD-10-CM | POA: Insufficient documentation

## 2017-07-25 DIAGNOSIS — Z720 Tobacco use: Secondary | ICD-10-CM | POA: Diagnosis not present

## 2017-07-25 LAB — BASIC METABOLIC PANEL
Anion gap: 9 (ref 5–15)
BUN: 11 mg/dL (ref 6–20)
CHLORIDE: 104 mmol/L (ref 101–111)
CO2: 26 mmol/L (ref 22–32)
CREATININE: 0.73 mg/dL (ref 0.44–1.00)
Calcium: 8.8 mg/dL — ABNORMAL LOW (ref 8.9–10.3)
GFR calc Af Amer: >60 mL/min (ref >60)
GFR calc non Af Amer: >60 mL/min (ref >60)
Glucose, Bld: 94 mg/dL (ref 65–99)
POTASSIUM: 4.6 mmol/L (ref 3.5–5.1)
Sodium: 139 mmol/L (ref 135–145)

## 2017-07-25 NOTE — Progress Notes (Signed)
Advanced Heart Failure Clinic Note   Primary Care: Dr. Lois Huxley in [facility] PCP-Cardiologist: Arvilla Meres, MD   HPI:  Latoya Adams is a 72 y.o. female with history of CAD s/ CABG x 4 12/08/2016, chronic systolic CHF cMRI EF 24% with only small apex scar, Tobacco use, COPD, and chronic anemia.   Pt admitted 12/03/2016 with NSTEMI, Troponin peak 8.5. Taken for urgent cath as below which showed severe multivessel disease and depressed EF. Echo with LVEF ~ 25%. cMRI viability study only showed small apical scar. Taken for CABG on 12/08/16. Pt recovery was relatively unremarkable. She tolerated adjustment of her HF medications and was sent to SNF on discharge. Discharge weight was 134 lbs.   Underwent Pleurex catheter for recurrent R pleural effusion on 03/06/17. Removed 06/12/2017  She presents today for re-evaluation. Seen two weeks ago and was dizzy with orthostatics, though ReDs vest ready 34% (Think she may be too small for accurate measurement). Lasix cut back. CXR with no change in hyper aeration and somewhat blunted R costophrenic angle. She denies further lightheadedness. BP remains soft in CR, 90s low 100s. Not further edema. Walking on TM without difficult. Taking all medications as directed.   Echo 04/2017 EF 20-25% RV normal moderate MR  Review of systems complete and found to be negative unless listed in HPI.    Past Medical History:  Diagnosis Date  . Alcohol abuse   . Anxiety   . Depression   . Dyspnea    due to Pulmonary Effusion  . History of blood transfusion 2018   during CABG  . Hypertension   . Myocardial infarction [facility]) 12/2016    Current Outpatient Medications  Medication Sig Dispense Refill  . aspirin 81 MG tablet Take 1 tablet (81 mg total) daily by mouth.    Marland Kitchen atorvastatin (LIPITOR) 80 MG tablet Take 1 tablet (80 mg total) by mouth daily at 6 PM. 30 tablet 1  . buPROPion (WELLBUTRIN XL) 300 MG 24 hr tablet Take 300 mg by mouth daily.    . carvedilol  (COREG) 12.5 MG tablet Take 1 tablet (12.5 mg total) by mouth 2 (two) times daily. 180 tablet 3  . digoxin (LANOXIN) 0.125 MG tablet Take 1 tablet (0.125 mg total) by mouth daily. 90 tablet 3  . furosemide (LASIX) 40 MG tablet Take 1 tablet (40 mg total) by mouth daily. 90 tablet 3  . nitroGLYCERIN (NITROSTAT) 0.4 MG SL tablet Place 1 tablet (0.4 mg total) under the tongue every 5 (five) minutes as needed for chest pain. 15 tablet 3  . potassium chloride SA (K-DUR,KLOR-CON) 20 MEQ tablet Take 20-40 mEq by mouth See admin instructions. Take 40 meq (2 tablets) in the morning and 20 meq (1 tablet) in the evening.     . sacubitril-valsartan (ENTRESTO) 24-26 MG Take 1 tablet by mouth 2 (two) times daily. 180 tablet 3  . sertraline (ZOLOFT) 100 MG tablet Take 200 mg by mouth daily.    Marland Kitchen spironolactone (ALDACTONE) 25 MG tablet Take 1 tablet (25 mg total) by mouth daily. 90 tablet 3  . traMADol (ULTRAM) 50 MG tablet Take 1-2 tablets (50-100 mg total) by mouth every 4 (four) hours as needed for moderate pain. 30 tablet 0   No current facility-administered medications for this encounter.    Allergies  Allergen Reactions  . Codeine Hives    Social History   Socioeconomic History  . Marital status: Single    Spouse name: Not on file  .  Number of children: Not on file  . Years of education: Not on file  . Highest education level: Not on file  Social Needs  . Financial resource strain: Not on file  . Food insecurity - worry: Not on file  . Food insecurity - inability: Not on file  . Transportation needs - medical: Not on file  . Transportation needs - non-medical: Not on file  Occupational History  . Not on file  Tobacco Use  . Smoking status: Former Smoker    Years: 50.00    Types: Cigarettes    Last attempt to quit: 12/03/2016    Years since quitting: 0.6  . Smokeless tobacco: Never Used  Substance and Sexual Activity  . Alcohol use: No    Comment: Recovering  Alocholic  . Drug use: No    . Sexual activity: Not on file  Other Topics Concern  . Not on file  Social History Narrative  . Not on file   Family History  Problem Relation Age of Onset  . Heart attack Neg Hx    Vitals:   07/25/17 1038  BP: (!) 120/58  Pulse: 79  SpO2: 99%  Weight: 130 lb 3.2 oz (59.1 kg)     Wt Readings from Last 3 Encounters:  07/25/17 130 lb 3.2 oz (59.1 kg)  07/11/17 128 lb (58.1 kg)  06/22/17 124 lb 6 oz (56.4 kg)   PHYSICAL EXAM: General: Well appearing. No resp difficulty. HEENT: Normal Neck: Supple. JVP 5-6. Carotids 2+ bilat; no bruits. No thyromegaly or nodule noted. Cor: PMI nondisplaced. RRR, No M/G/R noted Lungs: CTAB, normal effort. Abdomen: Soft, non-tender, non-distended, no HSM. No bruits or masses. +BS  Extremities: No cyanosis, clubbing, or rash. R and LLE no edema.  Neuro: Alert & orientedx3, cranial nerves grossly intact. moves all 4 extremities w/o difficulty. Affect pleasant   ASSESSMENT & PLAN:  1. Chronic systolic HF - Pre-op echo (7/18) EF 20-25% with normal RV - Pre-op cMRI (7/18) EF 24% with only small scar in apex. RHC with normal CO and no PAH - s/p CABG x 4. 12/08/16 - Echo 04/2017 EF 20-25%. Will recheck Echo.  - NYHA II symptoms.  - Volume status stable. - Continue lasix 40 mg daily. Can hold as needed.  - Continue dig 0.125 mg daily. - Continue carvedilol 12.5 mg twice a day.  - Continue Entresto 24/26 mg BID. BMET today.  - Continue spiro 25 mg daily.  - Reinforced fluid restriction to < 2 L daily, sodium restriction to less than 2000 mg daily, and the importance of daily weights.   2. NSTEMI/CAD -- Critical pLAD disease with ulcerated plaque at bifurcation with D1 as well as RPDA 90% lesion.  RHC ok. PFTs with severe COPD. Carotid u/s ok.  - S/P CABG x4 on 12/08/16 . On ASA and statin.  - No s/s of ischemia.    - Continue cardiac rehab.  - Continue ASA 81 mg daily  3. Recurrent R pleural effusion - s/p Pleurex removal. Doing well. CXR  last visit with mild costophrenic angle blunting but no significant effusion.  - Follows with Dr. Donata Clay  4. PAD - Pt has known "severe aorto-iliac disease" noted on CTA.  - Having leg fatigue with treadmill. If continues will check ABIs. Stable.   5. Tobacco use/COPD - PFTs show severe COPD.  - She remains abstinent from cigarettes. Congratulated.  Doing well. Having leg fatigue but no outright pain, if worsens or continues need to consider  ABIs. RTC 6 weeks with Echo for ICD consideration.   Graciella Freer, PA-C 07/25/17   Greater than 50% of the 25 minute visit was spent in counseling/coordination of care regarding disease state education, salt/fluid restriction, sliding scale diuretics, review of test results, and medication compliance.

## 2017-07-25 NOTE — Patient Instructions (Signed)
Routine lab work today. Will notify you of abnormal results, otherwise no news is good news!  Follow up 6 weeks with echocardiogram and appointment with Dr. [name].  ________________________________________________________________ [place] Code:  Take all medication as prescribed the day of your appointment. Bring all medications with you to your appointment.  Do the following things EVERYDAY: 1) Weigh yourself in the morning before breakfast. Write it down and keep it in a log. 2) Take your medicines as prescribed 3) Eat low salt foods-Limit salt (sodium) to 2000 mg per day.  4) Stay as active as you can everyday 5) Limit all fluids for the day to less than 2 liters

## 2017-09-03 ENCOUNTER — Ambulatory Visit (HOSPITAL_BASED_OUTPATIENT_CLINIC_OR_DEPARTMENT_OTHER)
Admission: RE | Admit: 2017-09-03 | Discharge: 2017-09-03 | Disposition: A | Discharge status: Home / Self Care | Payer: Medicare PPO | Source: Ambulatory Visit | Attending: Internal Medicine | Admitting: Internal Medicine

## 2017-09-03 ENCOUNTER — Encounter (HOSPITAL_COMMUNITY): Payer: Self-pay | Admitting: Internal Medicine

## 2017-09-03 ENCOUNTER — Ambulatory Visit (HOSPITAL_COMMUNITY)
Admission: RE | Admit: 2017-09-03 | Discharge: 2017-09-03 | Disposition: A | Discharge status: Home / Self Care | Payer: Medicare PPO | Source: Ambulatory Visit | Attending: Family Medicine | Admitting: Family Medicine

## 2017-09-03 VITALS — BP 102/58 | HR 67 | Wt 130.8 lb

## 2017-09-03 DIAGNOSIS — Z951 Presence of aortocoronary bypass graft: Secondary | ICD-10-CM

## 2017-09-03 DIAGNOSIS — I5022 Chronic systolic (congestive) heart failure: Secondary | ICD-10-CM

## 2017-09-03 DIAGNOSIS — Z7982 Long term (current) use of aspirin: Secondary | ICD-10-CM | POA: Diagnosis not present

## 2017-09-03 DIAGNOSIS — Z720 Tobacco use: Secondary | ICD-10-CM | POA: Insufficient documentation

## 2017-09-03 DIAGNOSIS — I739 Peripheral vascular disease, unspecified: Secondary | ICD-10-CM | POA: Diagnosis not present

## 2017-09-03 DIAGNOSIS — F419 Anxiety disorder, unspecified: Secondary | ICD-10-CM | POA: Diagnosis not present

## 2017-09-03 DIAGNOSIS — D649 Anemia, unspecified: Secondary | ICD-10-CM | POA: Diagnosis not present

## 2017-09-03 DIAGNOSIS — Z79899 Other long term (current) drug therapy: Secondary | ICD-10-CM | POA: Diagnosis not present

## 2017-09-03 DIAGNOSIS — F329 Major depressive disorder, single episode, unspecified: Secondary | ICD-10-CM | POA: Diagnosis not present

## 2017-09-03 DIAGNOSIS — I110 Hypertensive heart disease with heart failure: Secondary | ICD-10-CM | POA: Diagnosis present

## 2017-09-03 DIAGNOSIS — I252 Old myocardial infarction: Secondary | ICD-10-CM | POA: Diagnosis not present

## 2017-09-03 DIAGNOSIS — J449 Chronic obstructive pulmonary disease, unspecified: Secondary | ICD-10-CM | POA: Insufficient documentation

## 2017-09-03 DIAGNOSIS — I2510 Atherosclerotic heart disease of native coronary artery without angina pectoris: Secondary | ICD-10-CM

## 2017-09-03 DIAGNOSIS — Z885 Allergy status to narcotic agent status: Secondary | ICD-10-CM | POA: Insufficient documentation

## 2017-09-03 DIAGNOSIS — J90 Pleural effusion, not elsewhere classified: Secondary | ICD-10-CM | POA: Insufficient documentation

## 2017-09-03 MED ORDER — POTASSIUM CHLORIDE CRYS ER 20 MEQ PO TBCR: 20.0000 meq | EXTENDED_RELEASE_TABLET | ORAL | Status: DC

## 2017-09-03 MED ORDER — FUROSEMIDE 40 MG PO TABS: 40.0000 mg | ORAL_TABLET | ORAL | 3 refills | Status: DC

## 2017-09-03 NOTE — Patient Instructions (Signed)
Decrease Furosemide to 40 mg every Monday, Wednesday and Friday.  Can take an extra tab as needed  Decrease Potassium to every Monday, Wednesday and Friday, if you take extra Furosemide then take an extra Potassium   Your physician has requested that you have a lower or upper extremity arterial duplex. This test is an ultrasound of the arteries in the legs or arms. It looks at arterial blood flow in the legs and arms. Allow one hour for Lower and Upper Arterial scans. There are no restrictions or special instructions  You have been referred to EP to discuss a defibrillator   Your physician recommends that you schedule a follow-up appointment in: 3-4 months

## 2017-09-03 NOTE — Progress Notes (Signed)
Advanced Heart Failure Clinic Note   Primary Care: Dr. Lois Huxley in [facility] PCP-Cardiologist: Arvilla Meres, MD   HPI:  Latoya Adams is a 72 y.o. female with history of CAD s/ CABG x 4 12/08/2016, chronic systolic CHF cMRI EF 24% with only small apex scar, Tobacco use, COPD, and chronic anemia.   Pt admitted 12/03/2016 with NSTEMI, Troponin peak 8.5. Taken for urgent cath as below which showed severe multivessel disease and depressed EF. Echo with LVEF ~ 25%. cMRI viability study only showed small apical scar. Taken for CABG on 12/08/16. Pt recovery was relatively unremarkable. She tolerated adjustment of her HF medications and was sent to SNF on discharge. Discharge weight was 134 lbs.   Underwent Pleurex catheter for recurrent R pleural effusion on 03/06/17. Removed 06/12/2017  She presents today for follow up. She had an echo today to evaluate for consideration of ICD. EF remains low at 20%. Overall, doing okay. She is going to cardiac rehab 3x/week. She had an episode last week with SBP 70s at CR, so she was told to hold lasix x 2 days and then resume. She felt badly, but can't describe symptoms. Orthostatic dizziness is improving, but not totally resolved. No orthopnea, PND, or edema. No syncope. She has right lower leg pain on the treadmill that resolves with rest. Weights at home: 130 lbs. Compliant with medications.   Echo 04/2017 EF 20-25% RV normal moderate MR Echo 09/03/2017: EF 20%, RV normal, mild MR, Mild AI. Reviewed by Dr [name].   Review of systems complete and found to be negative unless listed in HPI.    Past Medical History:  Diagnosis Date  . Alcohol abuse   . Anxiety   . Depression   . Dyspnea    due to Pulmonary Effusion  . History of blood transfusion 2018   during CABG  . Hypertension   . Myocardial infarction [facility]) 12/2016    Current Outpatient Medications  Medication Sig Dispense Refill  . aspirin 81 MG tablet Take 1 tablet (81 mg total) daily by  mouth.    Marland Kitchen atorvastatin (LIPITOR) 80 MG tablet Take 1 tablet (80 mg total) by mouth daily at 6 PM. 30 tablet 1  . buPROPion (WELLBUTRIN XL) 300 MG 24 hr tablet Take 300 mg by mouth daily.    . carvedilol (COREG) 12.5 MG tablet Take 1 tablet (12.5 mg total) by mouth 2 (two) times daily. 180 tablet 3  . digoxin (LANOXIN) 0.125 MG tablet Take 1 tablet (0.125 mg total) by mouth daily. 90 tablet 3  . furosemide (LASIX) 40 MG tablet Take 1 tablet (40 mg total) by mouth daily. 90 tablet 3  . potassium chloride SA (K-DUR,KLOR-CON) 20 MEQ tablet Take 20-40 mEq by mouth See admin instructions. Take 40 meq (2 tablets) in the morning and 20 meq (1 tablet) in the evening.     . sacubitril-valsartan (ENTRESTO) 24-26 MG Take 1 tablet by mouth 2 (two) times daily. 180 tablet 3  . sertraline (ZOLOFT) 100 MG tablet Take 200 mg by mouth daily.    Marland Kitchen spironolactone (ALDACTONE) 25 MG tablet Take 1 tablet (25 mg total) by mouth daily. 90 tablet 3  . traMADol (ULTRAM) 50 MG tablet Take 1-2 tablets (50-100 mg total) by mouth every 4 (four) hours as needed for moderate pain. 30 tablet 0  . nitroGLYCERIN (NITROSTAT) 0.4 MG SL tablet Place 1 tablet (0.4 mg total) under the tongue every 5 (five) minutes as needed for chest pain. (Patient  not taking: Reported on 09/03/2017) 15 tablet 3   No current facility-administered medications for this encounter.    Allergies  Allergen Reactions  . Codeine Hives    Social History   Socioeconomic History  . Marital status: Single    Spouse name: Not on file  . Number of children: Not on file  . Years of education: Not on file  . Highest education level: Not on file  Occupational History  . Not on file  Social Needs  . Financial resource strain: Not on file  . Food insecurity:    Worry: Not on file    Inability: Not on file  . Transportation needs:    Medical: Not on file    Non-medical: Not on file  Tobacco Use  . Smoking status: Former Smoker    Years: 50.00    Types:  Cigarettes    Last attempt to quit: 12/03/2016    Years since quitting: 0.7  . Smokeless tobacco: Never Used  Substance and Sexual Activity  . Alcohol use: No    Comment: Recovering  Alocholic  . Drug use: No  . Sexual activity: Not on file  Lifestyle  . Physical activity:    Days per week: Not on file    Minutes per session: Not on file  . Stress: Not on file  Relationships  . Social connections:    Talks on phone: Not on file    Gets together: Not on file    Attends religious service: Not on file    Active member of club or organization: Not on file    Attends meetings of clubs or organizations: Not on file    Relationship status: Not on file  . Intimate partner violence:    Fear of current or ex partner: Not on file    Emotionally abused: Not on file    Physically abused: Not on file    Forced sexual activity: Not on file  Other Topics Concern  . Not on file  Social History Narrative  . Not on file   Family History  Problem Relation Age of Onset  . Heart attack Neg Hx    Vitals:   09/03/17 1009  BP: (!) 102/58  Pulse: 67  SpO2: 94%  Weight: 130 lb 12.8 oz (59.3 kg)    Orthostatics: 111/68 Sitting 92/58 Standing  Wt Readings from Last 3 Encounters:  09/03/17 130 lb 12.8 oz (59.3 kg)  07/25/17 130 lb 3.2 oz (59.1 kg)  07/11/17 128 lb (58.1 kg)   PHYSICAL EXAM: General: Well appearing. No resp difficulty. HEENT: Normal. Anicteric Neck: Supple. JVP flat. Carotids 2+ bilat; no bruits. No thyromegaly or nodule noted. Cor: PMI nondisplaced. RRR, No M/G/R noted Lungs: CTAB, normal effort. No wheeze Abdomen: Soft, non-tender, non-distended, no HSM. No bruits or masses. +BS  Extremities: No cyanosis, clubbing, or rash. R and LLE no edema. R DP 1+  Neuro: Alert & orientedx3, cranial nerves grossly intact. moves all 4 extremities w/o difficulty. Affect pleasant   ASSESSMENT & PLAN:  1. Chronic systolic HF - Pre-op echo (7/18) EF 20-25% with normal RV - Pre-op  cMRI (7/18) EF 24% with only small scar in apex. RHC with normal CO and no PAH - s/p CABG x 4. 12/08/16 - Echo 04/2017 EF 20-25%. Echo 09/2017 EF 20%, RV normal. Refer to EP for ICD.  - NYHA II symptoms.  - Volume status stable to dry.  - Continue lasix 40 mg daily. Takes 40/20 potassium supp -  Continue dig 0.125 mg daily. - Continue carvedilol 12.5 mg twice a day.  - Continue Entresto 24/26 mg BID. BMET today.  - Continue spiro 25 mg daily.  - Reinforced fluid restriction to < 2 L daily, sodium restriction to less than 2000 mg daily, and the importance of daily weights.   2. NSTEMI/CAD - Critical pLAD disease with ulcerated plaque at bifurcation with D1 as well as RPDA 90% lesion.  RHC ok. PFTs with severe COPD. Carotid u/s ok.  - S/P CABG x4 on 12/08/16 . On ASA and statin.  - No s/s ischemia.     - Continue cardiac rehab.  - Continue ASA 81 mg daily - She is now carrying nitro SL, but has not had to use.   3. Recurrent R pleural effusion - s/p Pleurex removal. Doing well. CXR last visit with mild costophrenic angle blunting but no significant effusion.  - Follows with Dr. Donata Clay. No change.   4. PAD/claudication  - Pt has known "severe aorto-iliac disease" noted on CTA.  - Having leg fatigue with treadmill that resolves with rest. Only on right side now - ABIs?  5. Tobacco use/COPD - PFTs show severe COPD.  - She remains abstinent from cigarettes. Congratulated.  6. Orthostasis - SBP 111 > 92  Refer to EP for ICD consideration ABIs? Decrease lasix? and potassium BMET  Alford Highland, NP 09/03/17   Patient seen and examined with the above-signed Advanced Practice Provider and/or Housestaff. I personally reviewed laboratory data, imaging studies and relevant notes. I independently examined the patient and formulated the important aspects of the plan. I have edited the note to reflect any of my changes or salient points. I have personally discussed the plan with the patient  and/or family.  Overall doing very well. Echo reviewed today and EF still 20% despite revascularization. RV normal. On decent meds. Remains orthostatic. Will cut back lasix to M/W/F and can take extra as needed. pleurex tube now out and doing well.   Will refer for ICD. Also will check ABIs with RLE claudication. Low threshold for CPX if symptoms worsen.   Arvilla Meres, MD  11:15 AM

## 2017-09-03 NOTE — Addendum Note (Signed)
Encounter addended by: Noralee Space, RN on: 09/03/2017 11:28 AM  Actions taken: Visit diagnoses modified, Diagnosis association updated, Order list changed, Sign clinical note

## 2017-09-06 ENCOUNTER — Other Ambulatory Visit (HOSPITAL_COMMUNITY): Payer: Self-pay | Admitting: Internal Medicine

## 2017-09-06 DIAGNOSIS — I739 Peripheral vascular disease, unspecified: Secondary | ICD-10-CM

## 2017-09-07 ENCOUNTER — Ambulatory Visit (HOSPITAL_COMMUNITY)
Admission: RE | Admit: 2017-09-07 | Discharge: 2017-09-07 | Disposition: A | Discharge status: Home / Self Care | Payer: Medicare PPO | Source: Ambulatory Visit | Attending: Cardiovascular Disease | Admitting: Cardiovascular Disease

## 2017-09-07 DIAGNOSIS — I739 Peripheral vascular disease, unspecified: Secondary | ICD-10-CM | POA: Diagnosis not present

## 2017-09-09 NOTE — Progress Notes (Signed)
Electrophysiology Office Note   Date:  09/10/2017   ID:  Delanda, Bulluck 1946/02/15, MRN [id number]  PCP:  Carmin Richmond, MD  Cardiologist: Bensimhon Primary Electrophysiologist:  Kevis Qu Jorja Loa, MD    Chief Complaint  Patient presents with  . Advice Only    Chronic systolic HF/Discuss ICD     History of Present Illness: Latoya Adams is a 72 y.o. female who is being seen today for the evaluation of ischemic cardiomyopathy at the request of Arvilla Meres. Presenting today for electrophysiology evaluation.  She has a history of coronary disease status post CABG x4 in 2018, chronic systolic heart failure with an EF of 24% by cardiac MRI, tobacco abuse, COPD, and anemia.  She was admitted on 12/03/16 with non-STEMI and a troponin of 8.5.  Catheterization showed severe multivessel disease and a depressed ejection fraction.  Cardiac MRI showed viability and a small apical scar.  She continues to do cardiac rehab 3 days a week.    Today, she denies symptoms of palpitations, chest pain, shortness of breath, orthopnea, PND, lower extremity edema, claudication, dizziness, presyncope, syncope, bleeding, or neurologic sequela. The patient is tolerating medications without difficulties.  Overall, she is feeling well.  She does say that since her cardiac surgery she has had less energy and fatigues easier.   Past Medical History:  Diagnosis Date  . Alcohol abuse   . Anxiety   . Depression   . Dyspnea    due to Pulmonary Effusion  . History of blood transfusion 2018   during CABG  . Hypertension   . Myocardial infarction [facility]) 12/2016   Past Surgical History:  Procedure Laterality Date  . CHEST TUBE INSERTION Right 03/06/2017   Procedure: INSERTION PLEURAL DRAINAGE CATHETER;  Surgeon: Kerin Perna, MD;  Location: [facility] OR;  Service: Thoracic;  Laterality: Right;  . COLONOSCOPY    . CORONARY ARTERY BYPASS GRAFT N/A 12/08/2016   Procedure: CORONARY ARTERY BYPASS GRAFTING  (CABG)x4 using left internal mammary artery and bilateral greater saphenous veins harvested endoscopically;  Surgeon: Kerin Perna, MD;  Location: [facility] OR;  Service: Open Heart Surgery;  Laterality: N/A;  . dental implants    . IR THORACENTESIS ASP PLEURAL SPACE W/IMG GUIDE  01/24/2017  . LEFT HEART CATH AND CORONARY ANGIOGRAPHY N/A 12/03/2016   Procedure: Left Heart Cath and Coronary Angiography;  Surgeon: Tonny Bollman, MD;  Location: [name] INVASIVE CV LAB;  Service: Cardiovascular;  Laterality: N/A;  . REMOVAL OF PLEURAL DRAINAGE CATHETER Right 06/12/2017   Procedure: REMOVAL OF PLEURAL DRAINAGE CATHETER;  Surgeon: Kerin Perna, MD;  Location: [facility] OR;  Service: Thoracic;  Laterality: Right;  . RIGHT HEART CATH N/A 12/05/2016   Procedure: Right Heart Cath;  Surgeon: Dolores Patty, MD;  Location: [facility] INVASIVE CV LAB;  Service: Cardiovascular;  Laterality: N/A;  . TEE WITHOUT CARDIOVERSION N/A 12/08/2016   Procedure: TRANSESOPHAGEAL ECHOCARDIOGRAM (TEE);  Surgeon: Donata Clay, Theron Arista, MD;  Location: [facility] OR;  Service: Open Heart Surgery;  Laterality: N/A;  . TUBAL LIGATION       Current Outpatient Medications  Medication Sig Dispense Refill  . aspirin 81 MG tablet Take 1 tablet (81 mg total) daily by mouth.    Marland Kitchen atorvastatin (LIPITOR) 80 MG tablet Take 1 tablet (80 mg total) by mouth daily at 6 PM. 30 tablet 1  . buPROPion (WELLBUTRIN XL) 300 MG 24 hr tablet Take 300 mg by mouth daily.    . carvedilol (COREG) 12.5 MG  tablet Take 1 tablet (12.5 mg total) by mouth 2 (two) times daily. 180 tablet 3  . digoxin (LANOXIN) 0.125 MG tablet Take 1 tablet (0.125 mg total) by mouth daily. 90 tablet 3  . furosemide (LASIX) 40 MG tablet Take 1 tablet (40 mg total) by mouth 3 (three) times a week. Every Mon, Wed and Friday.  Can take extra tab as needed 90 tablet 3  . nitroGLYCERIN (NITROSTAT) 0.4 MG SL tablet Place 1 tablet (0.4 mg total) under the tongue every 5 (five) minutes as needed for chest pain. 15  tablet 3  . potassium chloride SA (K-DUR,KLOR-CON) 20 MEQ tablet Take 1 tablet (20 mEq total) by mouth 3 (three) times a week. Every Mon, Wed and Friday.  Take extra tab when you take extra Furosemide    . sacubitril-valsartan (ENTRESTO) 24-26 MG Take 1 tablet by mouth 2 (two) times daily. 180 tablet 3  . sertraline (ZOLOFT) 100 MG tablet Take 200 mg by mouth daily.    Marland Kitchen spironolactone (ALDACTONE) 25 MG tablet Take 1 tablet (25 mg total) by mouth daily. 90 tablet 3  . traMADol (ULTRAM) 50 MG tablet Take 1-2 tablets (50-100 mg total) by mouth every 4 (four) hours as needed for moderate pain. 30 tablet 0   No current facility-administered medications for this visit.     Allergies:   Codeine   Social History:  The patient  reports that she quit smoking about 9 months ago. Her smoking use included cigarettes. She quit after 50.00 years of use. She has never used smokeless tobacco. She reports that she does not drink alcohol or use drugs.   Family History:  [name] family history includes CAD in her sister; Heart block in her father, mother, and sister.    ROS:  Please see the history of present illness.   Otherwise, review of systems is positive for none.   All other systems are reviewed and negative.    PHYSICAL EXAM: VS:  BP (!) 100/58   Pulse 77   Ht 5\' 7"  (1.702 m)   Wt 130 lb (59 kg)   SpO2 96%   BMI 20.36 kg/m  , BMI Body mass index is 20.36 kg/m. GEN: Well nourished, well developed, in no acute distress  HEENT: normal  Neck: no JVD, carotid bruits, or masses Cardiac: RRR; no murmurs, rubs, or gallops,no edema  Respiratory:  clear to auscultation bilaterally, normal work of breathing GI: soft, nontender, nondistended, + BS MS: no deformity or atrophy  Skin: warm and dry Neuro:  Strength and sensation are intact Psych: euthymic mood, full affect  EKG:  EKG is ordered today. Personal review of the ekg ordered shows sinus rhythm, LVH with repo abnormalities, rate  77  Recent Labs: 12/06/2016: TSH 4.875 12/09/2016: Magnesium 2.3 04/10/2017: ALT 14; Hemoglobin 10.4; Platelets 251 05/07/2017: B Natriuretic Peptide 384.9 07/25/2017: BUN 11; Creatinine, Ser 0.73; Potassium 4.6; Sodium 139    Lipid Panel     Component Value Date/Time   CHOL 144 12/06/2016 0002   TRIG 117 12/06/2016 0002   HDL 32 (L) 12/06/2016 0002   CHOLHDL 4.5 12/06/2016 0002   VLDL 23 12/06/2016 0002   LDLCALC 89 12/06/2016 0002     Wt Readings from Last 3 Encounters:  09/10/17 130 lb (59 kg)  09/03/17 130 lb 12.8 oz (59.3 kg)  07/25/17 130 lb 3.2 oz (59.1 kg)      Other studies Reviewed: Additional studies/ records that were reviewed today include: TTE 09/03/17  Review of the above records today demonstrates:  - Left ventricle: LVEF is severely depressed at approximately 25%   with akinesis of the proximal septal wall; hypokinesis of the   inferior, mid /distal septal, anteror and anterolateral walls   COmpared to images from 2018 no significant change. The cavity   size was mildly dilated. Wall thickness was normal. Doppler   parameters are consistent with abnormal left ventricular   relaxation (grade 1 diastolic dysfunction). - Aortic valve: There was mild regurgitation. - Mitral valve: There was mild regurgitation.   ASSESSMENT AND PLAN:  1.  Chronic systolic heart failure: Early on optimal medical therapy with carvedilol, Entresto, and Aldactone.  She also takes Lasix for volume issues.  Her ejection fraction has been low for greater than 3 months and thus she would likely benefit from ICD therapy.  Risks and benefits were discussed.  Risks include bleeding, tamponade, infection, pneumothorax among others.  The patient understands these risks and is agreed to the procedure.  2.  Coronary artery disease: Status post CABG times 47/6/18.  Currently on aspirin.  Undergoing cardiac rehab.  3.  Tobacco abuse: Continues to not smoke.  Severe COPD by PFTs.    Current  medicines are reviewed at length with the patient today.   The patient does not have concerns regarding her medicines.  The following changes were made today:  none  Labs/ tests ordered today include:  Orders Placed This Encounter  Procedures  . Basic Metabolic Panel (BMET)  . CBC w/Diff  . EKG 12-Lead     Disposition:   FU with Tremar Wickens 3 months  Signed, Madaleine Simmon Jorja Loa, MD  09/10/2017 9:33 AM     [facility] [address] [place] [address] [phone] (office) [phone] (fax)

## 2017-09-10 ENCOUNTER — Encounter: Payer: Self-pay | Admitting: Cardiology

## 2017-09-10 ENCOUNTER — Ambulatory Visit: Payer: Medicare PPO | Admitting: Cardiology

## 2017-09-10 ENCOUNTER — Other Ambulatory Visit: Payer: Self-pay | Admitting: Cardiology

## 2017-09-10 VITALS — BP 100/58 | HR 77 | Ht 67.0 in | Wt 130.0 lb

## 2017-09-10 DIAGNOSIS — I255 Ischemic cardiomyopathy: Secondary | ICD-10-CM | POA: Diagnosis not present

## 2017-09-10 DIAGNOSIS — I25810 Atherosclerosis of coronary artery bypass graft(s) without angina pectoris: Secondary | ICD-10-CM | POA: Diagnosis not present

## 2017-09-10 DIAGNOSIS — Z01812 Encounter for preprocedural laboratory examination: Secondary | ICD-10-CM | POA: Diagnosis not present

## 2017-09-10 LAB — CBC WITH DIFFERENTIAL/PLATELET
BASOS ABS: 0 10*3/uL (ref 0.0–0.2)
Basos: 0 %
EOS (ABSOLUTE): 0.2 10*3/uL (ref 0.0–0.4)
Eos: 3 %
HEMOGLOBIN: 11.2 g/dL (ref 11.1–15.9)
Hematocrit: 32.8 % — ABNORMAL LOW (ref 34.0–46.6)
IMMATURE GRANS (ABS): 0 10*3/uL (ref 0.0–0.1)
IMMATURE GRANULOCYTES: 0 %
LYMPHS: 16 %
Lymphocytes Absolute: 1.1 10*3/uL (ref 0.7–3.1)
MCH: 29.8 pg (ref 26.6–33.0)
MCHC: 34.1 g/dL (ref 31.5–35.7)
MCV: 87 fL (ref 79–97)
Monocytes Absolute: 0.4 10*3/uL (ref 0.1–0.9)
Monocytes: 7 %
NEUTROS PCT: 74 %
Neutrophils Absolute: 5.1 10*3/uL (ref 1.4–7.0)
Platelets: 314 10*3/uL (ref 150–379)
RBC: 3.76 x10E6/uL — AB (ref 3.77–5.28)
RDW: 13.9 % (ref 12.3–15.4)
WBC: 6.8 10*3/uL (ref 3.4–10.8)

## 2017-09-10 LAB — BASIC METABOLIC PANEL
BUN/Creatinine Ratio: 12 (ref 12–28)
BUN: 10 mg/dL (ref 8–27)
CALCIUM: 9.2 mg/dL (ref 8.7–10.3)
CHLORIDE: 103 mmol/L (ref 96–106)
CO2: 24 mmol/L (ref 20–29)
CREATININE: 0.81 mg/dL (ref 0.57–1.00)
GFR calc Af Amer: 86 mL/min/{1.73_m2} (ref >59)
GFR calc non Af Amer: 74 mL/min/{1.73_m2} (ref >59)
GLUCOSE: 113 mg/dL — AB (ref 65–99)
Potassium: 4.6 mmol/L (ref 3.5–5.2)
Sodium: 143 mmol/L (ref 134–144)

## 2017-09-10 NOTE — Patient Instructions (Signed)
Medication Instructions:  Your physician recommends that you continue on your current medications as directed. Please refer to the Current Medication list given to you today.     * If you need a refill on your cardiac medications before your next appointment, please call your pharmacy. *   Labwork: Pre procedure lab work today: BMET & CBC w/ diff  * Will notify you of abnormal results, otherwise continue current treatment plan.*   Testing/Procedures: Your physician has recommended that you have a defibrillator inserted. An implantable cardioverter defibrillator (ICD) is a small device that is placed in your chest or, in rare cases, your abdomen. This device uses electrical pulses or shocks to help control life-threatening, irregular heartbeats that could lead the heart to suddenly stop beating (sudden cardiac arrest). Leads are attached to the ICD that goes into your heart. This is done in the hospital and usually requires an overnight stay. Please follow the instructions below, located under the special instructions section.   Follow-Up: Your physician recommends that you schedule a wound check appointment 10-14 days, after your procedure on 09/20/2017, with the device clinic.  Your physician recommends that you schedule a follow up appointment in 91 days, after your procedure on 09/20/2017, with Dr. Elberta Fortis.  * Please note that any paperwork needing to be filled out by the provider will need to be addressed at the front desk prior to seeing the provider.  Please note that any FMLA, disability or other documents regarding health condition is subject to a $25.00 charge that must be received prior to completion of paperwork in the form of a money order or check. *  Thank you for choosing [facility]!!   Dory Horn, RN [phone]   Any Other Special Instructions Will Be Listed Below (If Applicable).     Implantable Device Instructions  You are scheduled for:    _____ Implantable Cardioverter Defibrillator  on  09/20/2017  with Dr. Elberta Fortis.  1.   Please arrive at the [facility], Entrance "A"  at [facility] at  12:00 p.m. on the day of your procedure. (The address is [address])  2. Do not eat or drink after midnight the night before your procedure.  3.   Complete pre procedure  lab work on 09/10/2017.  The lab at [facility] is open from 8:00 AM to 4:30 PM.  You do not have to be fasting.  4.   A) - Hold the following medications the morning of your procedure:    1. Lasix   2. Spironolactone           - All of your remaining medications may be taken with a small amount of water the  morning of your procedure.        B)  Hold all of your morning medications the morning of your procedure.  5.  Plan for an overnight stay.  Bring your insurance cards and a list of you medications.  6.  Wash your chest and neck with surgical scrub the evening before and the morning of      your procedure.  Rinse well. Please review the surgical scrub instruction sheet given       to you.  7. Your chest will need to be shaved prior to this procedure (if needed). We ask that you do this yourself at home 1 to 2 days before or if uncomfortable/unable to do yourself, then it will be performed by the hospital staff  the day of.                                                                                                                * If you have ANY questions after you get home, please call Dory Horn, RN @ [phone].  * Every attempt is made to prevent procedures from being rescheduled.  Due to the nature of  Electrophysiology, rescheduling can happen.  The physician is always aware and directs the staff when this occurs.        Cardioverter Defibrillator Implantation An implantable cardioverter defibrillator (ICD) is a small, lightweight, battery-powered device that is placed (implanted) under the skin in the chest or  abdomen. Your caregiver may prescribe an ICD if:  You have had an irregular heart rhythm (arrhythmia) that originated in the lower chambers of the heart (ventricles).  Your heart has been damaged by a disease (such as coronary artery disease) or heart condition (such as a heart attack). An ICD consists of a battery that lasts several years, a small computer called a pulse generator, and wires called leads that go into the heart. It is used to detect and correct two dangerous arrhythmias: a rapid heart rhythm (tachycardia) and an arrhythmia in which the ventricles contract in an uncoordinated way (fibrillation). When an ICD detects tachycardia, it sends an electrical signal to the heart that restores the heartbeat to normal (cardioversion). This signal is usually painless. If cardioversion does not work or if the ICD detects fibrillation, it delivers a small electrical shock to the heart (defibrillation) to restart the heart. The shock may feel like a strong jolt in the chest.ICDs may be programmed to correct other problems. Sometimes, ICDs are programmed to act as another type of implantable device called a pacemaker. Pacemakers are used to treat a slow heartbeat (bradycardia). LET YOUR CAREGIVER KNOW ABOUT:  Any allergies you have.  All medicines you are taking, including vitamins, herbs, eyedrops, and over-the-counter medicines and creams.  Previous problems you or members of your family have had with the use of anesthetics.  Any blood disorders you have had.  Other health problems you have. RISKS AND COMPLICATIONS Generally, the procedure to implant an ICD is safe. However, as with any surgical procedure, complications can occur. Possible complications associated with implanting an ICD include:  Swelling, bleeding, or bruising at the site where the ICD was implanted.  Infection at the site where the ICD was implanted.  A reaction to medicine used during the procedure.  Nerve, heart, or  blood vessel damage.  Blood clots. BEFORE THE PROCEDURE  You may need to have blood tests, heart tests, or a chest X-ray done before the day of the procedure.  Ask your caregiver about changing or stopping your regular medicines.  Make plans to have someone drive you home. You may need to stay in the hospital overnight after the procedure.  Stop smoking at least 24 hours before the procedure.  Take a bath or shower the night before the procedure. You  may need to scrub your chest or abdomen with a special type of soap.  Do not eat or drink before your procedure for as long as directed by your caregiver. Ask if it is okay to take any needed medicine with a small sip of water. PROCEDURE  The procedure to implant an ICD in your chest or abdomen is usually done at a hospital in a room that has a large X-ray machine called a fluoroscope. The machine will be above you during the procedure. It will help your caregiver see your heart during the procedure. Implanting an ICD usually takes 1-3 hours. Before the procedure:   Small monitors will be put on your body. They will be used to check your heart, blood pressure, and oxygen level.  A needle will be put into a vein in your hand or arm. This is called an intravenous (IV) access tube. Fluids and medicine will flow directly into your body through the IV tube.  Your chest or abdomen will be cleaned with a germ-killing (antiseptic) solution. The area may be shaved.  You may be given medicine to help you relax (sedative).  You will be given a medicine called a local anesthetic. This medicine will make the surgical site numb while the ICD is implanted. You will be sleepy but awake during the procedure. After you are numb the procedure will begin. The caregiver will:  Make a small cut (incision). This will make a pocket deep under your skin that will hold the pulse generator.  Guide the leads through a large blood vessel into your heart and attach  them to the heart muscles. Depending on the ICD, the leads may go into one ventricle or they may go to both ventricles and into an upper chamber of the heart (atrium).  Test the ICD.  Close the incision with stitches, glue, or staples. AFTER THE PROCEDURE  You may feel pain. Some pain is normal. It may last a few days.  You may stay in a recovery area until the local anesthetic has worn off. Your blood pressure and pulse will be checked often. You will be taken to a room where your heart will be monitored.  A chest X-ray will be taken. This is done to check that the cardioverter defibrillator is in the right place.  You may stay in the hospital overnight.  A slight bump may be seen over the skin where the ICD was placed. Sometimes, it is possible to feel the ICD under the skin. This is normal.  In the months and years afterward, your caregiver will check the device, the leads, and the battery every few months. Eventually, when the battery is low, the ICD will be replaced.   This information is not intended to replace advice given to you by your health care provider. Make sure you discuss any questions you have with your health care provider.   Document Released: 02/11/2002 Document Revised: 03/12/2013 Document Reviewed: 06/10/2012 Elsevier Interactive Patient Education 2016 [employer]    Cardioverter Defibrillator Implantation, Care After This sheet gives you information about how to care for yourself after your procedure. Your health care provider may also give you more specific instructions. If you have problems or questions, contact your health care provider. What can I expect after the procedure? After the procedure, it is common to have:  Some pain. It may last a few days.  A slight bump over the skin where the device was placed. Sometimes, it is possible to feel  the device under the skin. This is normal.  During the months and years after your procedure, your health care  provider will check the device, the leads, and the battery every few months. Eventually, when the battery is low, the device will be replaced. Follow these instructions at home: Medicines  Take over-the-counter and prescription medicines only as told by your health care provider.  If you were prescribed an antibiotic medicine, take it as told by your health care provider. Do not stop taking the antibiotic even if you start to feel better. Incision care   Follow instructions from your health care provider about how to take care of your incision area. Make sure you: ? Wash your hands with soap and water before you change your bandage (dressing). If soap and water are not available, use hand sanitizer. ? Change your dressing as told by your health care provider. ? Leave stitches (sutures), skin glue, or adhesive strips in place. These skin closures may need to stay in place for 2 weeks or longer. If adhesive strip edges start to loosen and curl up, you may trim the loose edges. Do not remove adhesive strips completely unless your health care provider tells you to do that.  Check your incision area every day for signs of infection. Check for: ? More redness, swelling, or pain. ? More fluid or blood. ? Warmth. ? Pus or a bad smell.  Do not use lotions or ointments near the incision area unless told by your health care provider.  Keep the incision area clean and dry for 2-3 days after the procedure or for as long as told by your health care provider. It takes several weeks for the incision site to heal completely.  Do not take baths, swim, or use a hot tub until your health care provider approves. Activity  Try to walk a little every day. Exercising is important after this procedure. Also, use your shoulder on the side of the defibrillator in daily tasks that do not require a lot of motion.  For at least 6 weeks: ? Do not lift your upper arm above your shoulders. This means no tennis, golf,  or swimming for this period of time. If you tend to sleep with your arm above your head, use a restraint to prevent this during sleep. ? Avoid sudden jerking, pulling, or chopping movements that pull your upper arm far away from your body.  Ask your health care provider when you may go back to work.  Check with your health care provider before you start to drive or play sports. Electric and magnetic fields  Tell all health care providers that you have a defibrillator. This may prevent them from giving you an MRI scan because strong magnets are used for that test.  If you must pass through a metal detector, quickly walk through it. Do not stop under the detector, and do not stand near it.  Avoid places or objects that have a strong electric or magnetic field, including: ? [employer]. At the airport, let officials know that you have a defibrillator. Your defibrillator ID card will let you be checked in a way that is safe for you and will not damage your defibrillator. Also, do not let a security person wave a magnetic wand near your defibrillator. That can make it stop working. ? Power plants. ? Large electrical generators. ? Anti-theft systems or electronic article surveillance (EAS). ? Radiofrequency transmission towers, such as cell phone and radio towers.  Do not use amateur (ham) radio equipment or electric (arc) welding torches. Some devices are safe to use if held at least 12 inches (30 cm) from your defibrillator. These include power tools, lawn mowers, and speakers. If you are unsure if something is safe to use, ask your health care provider.  Do not use MP3 player headphones. They have magnets.  You may safely use electric blankets, heating pads, computers, and microwave ovens.  When using your cell phone, hold it to the ear that is on the opposite side from the defibrillator. Do not leave your cell phone in a pocket over the defibrillator. General instructions  Follow  diet instructions from your health care provider, if this applies.  Always keep your defibrillator ID card with you. The card should list the implant date, device model, and manufacturer. Consider wearing a medical alert bracelet or necklace.  Have your defibrillator checked every 3-6 months or as often as told by your health care provider. Most defibrillators last for 4-8 years.  Keep all follow-up visits as told by your health care provider. This is important for your health care provider to make sure your chest is healing the way it should. Ask your health care provider when you should come back to have your stitches or staples taken out. Contact a health care provider if:  You feel one shock in your chest.  You gain weight suddenly.  Your legs or feet swell more than they have before.  It feels like your heart is fluttering or skipping beats (heart palpitations).  You have more redness, swelling, or pain around your incision.  You have more fluid or blood coming from your incision.  Your incision feels warm to the touch.  You have pus or a bad smell coming from your incision.  You have a fever. Get help right away if:  You have chest pain.  You feel more than one shock.  You feel more short of breath than you have felt before.  You feel more light-headed than you have felt before.  Your incision starts to open up. This information is not intended to replace advice given to you by your health care provider. Make sure you discuss any questions you have with your health care provider. Document Released: 12/09/2004 Document Revised: 12/10/2015 Document Reviewed: 10/27/2015 Elsevier Interactive Patient Education  2018 [employer].     Supplemental Discharge Instructions for  Pacemaker/Defibrillator Patients  Activity No heavy lifting or vigorous activity with your left/right arm for 6 to 8 weeks.  Do not raise your left/right arm above your head for one week.   Gradually raise your affected arm as drawn below.           __  NO DRIVING for     ; you may begin driving on     .  WOUND CARE - Keep the wound area clean and dry.  Do not get this area wet for one week. No showers for one week; you may shower on     . - The tape/steri-strips on your wound will fall off; do not pull them off.  No bandage is needed on the site.  DO  NOT apply any creams, oils, or ointments to the wound area. - If you notice any drainage or discharge from the wound, any swelling or bruising at the site, or you develop a fever > 101? F after you are discharged home, call the office at once.  Special Instructions - You are still  able to use cellular telephones; use the ear opposite the side where you have your pacemaker/defibrillator.  Avoid carrying your cellular phone near your device. - When traveling through airports, show security personnel your identification card to avoid being screened in the metal detectors.  Ask the security personnel to use the hand wand. - Avoid arc welding equipment, MRI testing (magnetic resonance imaging), TENS units (transcutaneous nerve stimulators).  Call the office for questions about other devices. - Avoid electrical appliances that are in poor condition or are not properly grounded. - Microwave ovens are safe to be near or to operate.  Additional information for defibrillator patients should your device go off: - If your device goes off ONCE and you feel fine afterward, notify the device clinic nurses. - If your device goes off ONCE and you do not feel well afterward, call 911. - If your device goes off TWICE, call 911. - If your device goes off THREE times in one day, call 911.  DO NOT DRIVE YOURSELF OR A FAMILY MEMBER WITH A DEFIBRILLATOR TO THE HOSPITAL-CALL 911.

## 2017-09-14 ENCOUNTER — Telehealth: Payer: Self-pay | Admitting: *Deleted

## 2017-09-14 DIAGNOSIS — I739 Peripheral vascular disease, unspecified: Secondary | ICD-10-CM

## 2017-09-14 NOTE — Telephone Encounter (Signed)
Aorta/Iliac duplex has been ordered, per Dr. Kirke Corin. Follow up appointment to be made after the duplex has been scheduled.

## 2017-09-19 ENCOUNTER — Telehealth: Payer: Self-pay | Admitting: *Deleted

## 2017-09-19 NOTE — Telephone Encounter (Signed)
Patient called to schedule an appointment following her aortoiliac duplex on 5/23. An appointment has been made for 11/06/17.

## 2017-09-20 ENCOUNTER — Encounter (HOSPITAL_COMMUNITY)
Admission: RE | Disposition: A | Discharge status: Home / Self Care | Payer: Self-pay | Source: Ambulatory Visit | Attending: Cardiology

## 2017-09-20 ENCOUNTER — Encounter (HOSPITAL_COMMUNITY): Payer: Self-pay | Admitting: General Practice

## 2017-09-20 ENCOUNTER — Other Ambulatory Visit: Payer: Self-pay

## 2017-09-20 ENCOUNTER — Ambulatory Visit (HOSPITAL_COMMUNITY)
Admission: RE | Admit: 2017-09-20 | Discharge: 2017-09-21 | Disposition: A | Discharge status: Home / Self Care | Payer: Medicare PPO | Source: Ambulatory Visit | Attending: Cardiology | Admitting: Cardiology

## 2017-09-20 DIAGNOSIS — Z885 Allergy status to narcotic agent status: Secondary | ICD-10-CM | POA: Diagnosis not present

## 2017-09-20 DIAGNOSIS — Z95818 Presence of other cardiac implants and grafts: Secondary | ICD-10-CM

## 2017-09-20 DIAGNOSIS — I255 Ischemic cardiomyopathy: Secondary | ICD-10-CM | POA: Diagnosis not present

## 2017-09-20 DIAGNOSIS — I5022 Chronic systolic (congestive) heart failure: Secondary | ICD-10-CM | POA: Diagnosis not present

## 2017-09-20 DIAGNOSIS — J449 Chronic obstructive pulmonary disease, unspecified: Secondary | ICD-10-CM | POA: Insufficient documentation

## 2017-09-20 DIAGNOSIS — I2510 Atherosclerotic heart disease of native coronary artery without angina pectoris: Secondary | ICD-10-CM | POA: Insufficient documentation

## 2017-09-20 DIAGNOSIS — I252 Old myocardial infarction: Secondary | ICD-10-CM | POA: Insufficient documentation

## 2017-09-20 DIAGNOSIS — Z7982 Long term (current) use of aspirin: Secondary | ICD-10-CM | POA: Diagnosis not present

## 2017-09-20 DIAGNOSIS — I110 Hypertensive heart disease with heart failure: Secondary | ICD-10-CM | POA: Diagnosis not present

## 2017-09-20 DIAGNOSIS — Z95810 Presence of automatic (implantable) cardiac defibrillator: Secondary | ICD-10-CM

## 2017-09-20 HISTORY — PX: ICD IMPLANT: EP1208

## 2017-09-20 HISTORY — DX: Atherosclerotic heart disease of native coronary artery without angina pectoris: I25.10

## 2017-09-20 HISTORY — DX: Presence of automatic (implantable) cardiac defibrillator: Z95.810

## 2017-09-20 HISTORY — DX: Heart failure, unspecified: I50.9

## 2017-09-20 LAB — SURGICAL PCR SCREEN
MRSA, PCR: NEGATIVE
STAPHYLOCOCCUS AUREUS: NEGATIVE

## 2017-09-20 SURGERY — ICD IMPLANT
Anesthesia: LOCAL

## 2017-09-20 MED ORDER — SODIUM CHLORIDE 0.9 % IV SOLN
INTRAVENOUS | Status: DC
  Administered 2017-09-20: 14:00:00 via INTRAVENOUS

## 2017-09-20 MED ORDER — LIDOCAINE HCL 1 % IJ SOLN
INTRAMUSCULAR | Status: AC
  Filled 2017-09-20: qty 20

## 2017-09-20 MED ORDER — ATORVASTATIN CALCIUM 80 MG PO TABS: 80.0000 mg | ORAL_TABLET | Freq: Every day | ORAL | Status: DC

## 2017-09-20 MED ORDER — SERTRALINE HCL 100 MG PO TABS
200.0000 mg | ORAL_TABLET | Freq: Every day | ORAL | Status: DC
  Administered 2017-09-21: 200 mg via ORAL
  Filled 2017-09-20: qty 2

## 2017-09-20 MED ORDER — CEFAZOLIN SODIUM-DEXTROSE 2-4 GM/100ML-% IV SOLN
INTRAVENOUS | Status: AC
  Filled 2017-09-20: qty 100

## 2017-09-20 MED ORDER — SPIRONOLACTONE 25 MG PO TABS
25.0000 mg | ORAL_TABLET | Freq: Every day | ORAL | Status: DC
  Administered 2017-09-20 – 2017-09-21 (×2): 25 mg via ORAL
  Filled 2017-09-20 (×2): qty 1

## 2017-09-20 MED ORDER — FENTANYL CITRATE (PF) 100 MCG/2ML IJ SOLN
INTRAMUSCULAR | Status: AC
  Filled 2017-09-20: qty 2

## 2017-09-20 MED ORDER — SODIUM CHLORIDE 0.9 % IV SOLN
80.0000 mg | INTRAVENOUS | Status: AC
  Administered 2017-09-20: 80 mg

## 2017-09-20 MED ORDER — NITROGLYCERIN 0.4 MG SL SUBL: 0.4000 mg | SUBLINGUAL_TABLET | SUBLINGUAL | Status: DC | PRN

## 2017-09-20 MED ORDER — POTASSIUM CHLORIDE CRYS ER 20 MEQ PO TBCR
20.0000 meq | EXTENDED_RELEASE_TABLET | ORAL | Status: DC
  Administered 2017-09-21: 20 meq via ORAL
  Filled 2017-09-20: qty 1

## 2017-09-20 MED ORDER — HEPARIN (PORCINE) IN NACL 1000-0.9 UT/500ML-% IV SOLN
INTRAVENOUS | Status: AC
  Filled 2017-09-20: qty 500

## 2017-09-20 MED ORDER — TRAMADOL HCL 50 MG PO TABS: 50.0000 mg | ORAL_TABLET | ORAL | Status: DC | PRN

## 2017-09-20 MED ORDER — FUROSEMIDE 40 MG PO TABS
40.0000 mg | ORAL_TABLET | ORAL | Status: DC
  Administered 2017-09-21: 40 mg via ORAL
  Filled 2017-09-20: qty 1

## 2017-09-20 MED ORDER — ACETAMINOPHEN 325 MG PO TABS
325.0000 mg | ORAL_TABLET | ORAL | Status: DC | PRN
  Administered 2017-09-20 – 2017-09-21 (×3): 650 mg via ORAL
  Filled 2017-09-20 (×3): qty 2

## 2017-09-20 MED ORDER — HEPARIN (PORCINE) IN NACL 2-0.9 UNITS/ML
INTRAMUSCULAR | Status: AC | PRN
  Administered 2017-09-20: 500 mL

## 2017-09-20 MED ORDER — MIDAZOLAM HCL 5 MG/5ML IJ SOLN
INTRAMUSCULAR | Status: AC
  Filled 2017-09-20: qty 5

## 2017-09-20 MED ORDER — BUPROPION HCL ER (XL) 300 MG PO TB24
300.0000 mg | ORAL_TABLET | Freq: Every day | ORAL | Status: DC
  Administered 2017-09-21: 300 mg via ORAL
  Filled 2017-09-20: qty 1

## 2017-09-20 MED ORDER — ASPIRIN EC 81 MG PO TBEC
81.0000 mg | DELAYED_RELEASE_TABLET | Freq: Every day | ORAL | Status: DC
  Administered 2017-09-21: 81 mg via ORAL
  Filled 2017-09-20: qty 1

## 2017-09-20 MED ORDER — FENTANYL CITRATE (PF) 100 MCG/2ML IJ SOLN
INTRAMUSCULAR | Status: DC | PRN
  Administered 2017-09-20 (×2): 25 ug via INTRAVENOUS

## 2017-09-20 MED ORDER — ONDANSETRON HCL 4 MG/2ML IJ SOLN: 4.0000 mg | Freq: Four times a day (QID) | INTRAMUSCULAR | Status: DC | PRN

## 2017-09-20 MED ORDER — CEFAZOLIN SODIUM-DEXTROSE 2-4 GM/100ML-% IV SOLN
2.0000 g | INTRAVENOUS | Status: AC
  Administered 2017-09-20: 2 g via INTRAVENOUS

## 2017-09-20 MED ORDER — DIGOXIN 125 MCG PO TABS
0.1250 mg | ORAL_TABLET | Freq: Every day | ORAL | Status: DC
  Administered 2017-09-21: 0.125 mg via ORAL
  Filled 2017-09-20: qty 1

## 2017-09-20 MED ORDER — LIDOCAINE HCL (PF) 1 % IJ SOLN
INTRAMUSCULAR | Status: DC | PRN
  Administered 2017-09-20: 60 mL

## 2017-09-20 MED ORDER — MUPIROCIN 2 % EX OINT
TOPICAL_OINTMENT | CUTANEOUS | Status: AC
  Administered 2017-09-20: 14:00:00
  Filled 2017-09-20: qty 22

## 2017-09-20 MED ORDER — SACUBITRIL-VALSARTAN 24-26 MG PO TABS
1.0000 | ORAL_TABLET | Freq: Two times a day (BID) | ORAL | Status: DC
  Administered 2017-09-21 (×2): 1 via ORAL
  Filled 2017-09-20 (×3): qty 1

## 2017-09-20 MED ORDER — MIDAZOLAM HCL 5 MG/5ML IJ SOLN
INTRAMUSCULAR | Status: DC | PRN
  Administered 2017-09-20 (×2): 1 mg via INTRAVENOUS

## 2017-09-20 MED ORDER — CARVEDILOL 12.5 MG PO TABS
12.5000 mg | ORAL_TABLET | Freq: Two times a day (BID) | ORAL | Status: DC
  Administered 2017-09-20 – 2017-09-21 (×2): 12.5 mg via ORAL
  Filled 2017-09-20 (×2): qty 1

## 2017-09-20 MED ORDER — SODIUM CHLORIDE 0.9 % IV SOLN
INTRAVENOUS | Status: AC
  Filled 2017-09-20: qty 2

## 2017-09-20 MED ORDER — CEFAZOLIN SODIUM-DEXTROSE 1-4 GM/50ML-% IV SOLN
1.0000 g | Freq: Four times a day (QID) | INTRAVENOUS | Status: AC
  Administered 2017-09-20 – 2017-09-21 (×3): 1 g via INTRAVENOUS
  Filled 2017-09-20 (×4): qty 50

## 2017-09-20 SURGICAL SUPPLY — 7 items
CABLE SURGICAL S-101-97-12 (CABLE) ×2 IMPLANT
ICD VISIA MRI VR DVFB1D4 (ICD Generator) ×1 IMPLANT
LEAD SPRINT QUAT SEC 6935M-62 (Lead) ×2 IMPLANT
PAD DEFIB LIFELINK (PAD) ×2 IMPLANT
SHEATH CLASSIC 9F (SHEATH) ×2 IMPLANT
TRAY PACEMAKER INSERTION (PACKS) ×2 IMPLANT
VISIA MRI VR DVFB1D4 (ICD Generator) ×2 IMPLANT

## 2017-09-20 NOTE — Discharge Instructions (Signed)
° ° °  Supplemental Discharge Instructions for  °Pacemaker/Defibrillator Patients ° °Activity °No heavy lifting or vigorous activity with your left/right arm for 6 to 8 weeks.  Do not raise your left/right arm above your head for one week.  Gradually raise your affected arm as drawn below. ° °        °   09/24/17                    09/25/17                   09/26/17                 09/27/17 °__ ° °NO DRIVING for   1 week  ; you may begin driving on  09/27/17   . ° °WOUND CARE °- Keep the wound area clean and dry.  Do not get this area wet, no showers until cleared to at your wound check visit     . °- The tape/steri-strips on your wound will fall off; do not pull them off.  No bandage is needed on the site.  DO  NOT apply any creams, oils, or ointments to the wound area. °- If you notice any drainage or discharge from the wound, any swelling or bruising at the site, or you develop a fever > 101? F after you are discharged home, call the office at once. ° °Special Instructions °- You are still able to use cellular telephones; use the ear opposite the side where you have your pacemaker/defibrillator.  Avoid carrying your cellular phone near your device. °- When traveling through airports, show security personnel your identification card to avoid being screened in the metal detectors.  Ask the security personnel to use the hand wand. °- Avoid arc welding equipment, MRI testing (magnetic resonance imaging), TENS units (transcutaneous nerve stimulators).  Call the office for questions about other devices. °- Avoid electrical appliances that are in poor condition or are not properly grounded. °- Microwave ovens are safe to be near or to operate. ° °Additional information for defibrillator patients should your device go off: °- If your device goes off ONCE and you feel fine afterward, notify the device clinic nurses. °- If your device goes off ONCE and you do not feel well afterward, call 911. °- If your device goes off TWICE,  call 911. °- If your device goes off THREE times in one day, call 911. ° °DO NOT DRIVE YOURSELF OR A FAMILY MEMBER °WITH A DEFIBRILLATOR TO THE HOSPITAL--CALL 911. ° ° ° ° °

## 2017-09-20 NOTE — H&P (Signed)
Latoya Adams has presented today for surgery, with the diagnosis of ischemic cardiomyopathy.  The various methods of treatment have been discussed with the patient and family. After consideration of risks, benefits and other options for treatment, the patient has consented to  Procedure(s): ICD as a surgical intervention .  Risks include but not limited to bleeding, tamponade, heart block, stroke, damage to surrounding organs, among others. [name] history has been reviewed, patient examined, no change in status, stable for surgery.  I have reviewed the patient's chart and labs.  Questions were answered to the patient's satisfaction.    Latoya Mozer Elberta Fortis, MD 09/20/2017 12:46 PM

## 2017-09-20 NOTE — Discharge Summary (Addendum)
ELECTROPHYSIOLOGY PROCEDURE DISCHARGE SUMMARY    Patient ID: Latoya Adams,  MRN: [id number], DOB/AGE: 12-06-1945 72 y.o.  Admit date: 09/20/2017 Discharge date: 09/21/17  Primary Care Physician: Carmin Richmond, MD Primary Cardiologist: Dr. [name] Electrophysiologist: Dr. Elberta Fortis  Primary Discharge Diagnosis:  1. ICM  Secondary Discharge Diagnosis:  1. CAD 2. Chronic CHF (systolic) 3. COPD 4. HTN  Allergies  Allergen Reactions  . Codeine Hives     Procedures This Admission:  1.  Implantation of a MDT single chamber ICD on 09/20/17 by Dr Elberta Fortis.  The patient received a Medtronic Visia AF MRI SureScan (serial  Number [id number]) ICD, Medtronic model [id number] (serial number [id number]) right ventricular defibrillator lead  DFT's were deferred at time of implant.   There were no immediate post procedure complications. 2.  CXR on 09/21/17 demonstrated no pneumothorax status post device implantation.   Brief HPI: Latoya Adams is a 72 y.o. female was referred to electrophysiology in the outpatient setting for consideration of ICD implantation.  Past medical history ins noted above.  The patient has persistent LV dysfunction despite guideline directed therapy.  Risks, benefits, and alternatives to ICD implantation were reviewed with the patient who wished to proceed.   Hospital Course:  The patient was admitted and underwent implantation of an ICD with details as outlined above. She was monitored on telemetry overnight which demonstrated SR, infrequent PVCs.  Left chest was without hematoma or ecchymosis.  The device was interrogated and found to be functioning normally.  CXR was obtained and demonstrated no pneumothorax status post device implantation.  Wound care, arm mobility, and restrictions were reviewed with the patient.  The patient feels well, no CP or SOB, she was examined by Dr. Elberta Fortis and considered stable for discharge to home.   [name] discharge  medications include an ARB (Entresto) and beta blocker (carvedilol).   Physical Exam: Vitals:   09/20/17 1900 09/20/17 2338 09/21/17 0603 09/21/17 0850  BP: 120/70 120/60 (!) 144/73 101/60  Pulse: 76 79 84 74  Resp: 18 18 18    Temp:  97.7 F (36.5 C) 98.1 F (36.7 C)   TempSrc:  Oral Oral   SpO2: 96% 94% 98% 94%  Weight:   129 lb (58.5 kg)   Height:        GEN- The patient is well appearing, alert and oriented x 3 today.   HEENT: normocephalic, atraumatic; sclera clear, conjunctiva pink; hearing intact; oropharynx clear Lungs- CTA b/l, normal work of breathing.  No wheezes, rales, rhonchi Heart- RRR, no murmurs, rubs or gallops, PMI not laterally displaced GI- soft, non-tender, non-distended Extremities- no clubbing, cyanosis, or edema MS- no significant deformity or atrophy Skin- warm and dry, no rash or lesion, left chest without hematoma/ecchymosis Psych- euthymic mood, full affect Neuro- no gross defecits  Labs:   Lab Results  Component Value Date   WBC 6.8 09/10/2017   HGB 11.2 09/10/2017   HCT 32.8 (L) 09/10/2017   MCV 87 09/10/2017   PLT 314 09/10/2017   No results for input(s): NA, K, CL, CO2, BUN, CREATININE, CALCIUM, PROT, BILITOT, ALKPHOS, ALT, AST, GLUCOSE in the last 168 hours.  Invalid input(s): LABALBU  Discharge Medications:  Allergies as of 09/21/2017      Reactions   Codeine Hives      Medication List    TAKE these medications   aspirin EC 81 MG tablet Take 1 tablet (81 mg total) daily by mouth.   atorvastatin 80 MG tablet  Commonly known as:  LIPITOR Take 1 tablet (80 mg total) by mouth daily at 6 PM.   buPROPion 300 MG 24 hr tablet Commonly known as:  WELLBUTRIN XL Take 300 mg by mouth daily.   carvedilol 12.5 MG tablet Commonly known as:  COREG Take 1 tablet (12.5 mg total) by mouth 2 (two) times daily.   digoxin 0.125 MG tablet Commonly known as:  LANOXIN Take 1 tablet (0.125 mg total) by mouth daily.   furosemide 40 MG  tablet Commonly known as:  LASIX Take 1 tablet (40 mg total) by mouth 3 (three) times a week. Every Mon, Wed and Friday.  Can take extra tab as needed What changed:    when to take this  additional instructions   nitroGLYCERIN 0.4 MG SL tablet Commonly known as:  NITROSTAT Place 1 tablet (0.4 mg total) under the tongue every 5 (five) minutes as needed for chest pain.   potassium chloride SA 20 MEQ tablet Commonly known as:  K-DUR,KLOR-CON Take 1 tablet (20 mEq total) by mouth 3 (three) times a week. Every Mon, Wed and Friday.  Take extra tab when you take extra Furosemide What changed:    when to take this  additional instructions   sacubitril-valsartan 24-26 MG Commonly known as:  ENTRESTO Take 1 tablet by mouth 2 (two) times daily.   sertraline 100 MG tablet Commonly known as:  ZOLOFT Take 200 mg by mouth daily.   spironolactone 25 MG tablet Commonly known as:  ALDACTONE Take 1 tablet (25 mg total) by mouth daily.   traMADol 50 MG tablet Commonly known as:  ULTRAM Take 1-2 tablets (50-100 mg total) by mouth every 4 (four) hours as needed for moderate pain.       Disposition:  Home  Discharge Instructions    Diet - low sodium heart healthy   Complete by:  As directed    Increase activity slowly   Complete by:  As directed      Follow-up Information    [facility] Office Follow up on 10/03/2017.   Specialty:  Cardiology Why:  10:00AM, wound check visit Contact information: [address] [place] [address] [phone]       Regan Lemming, MD Follow up on 12/24/2017.   Specialty:  Cardiology Why:  10:30AM Contact information: [address] [place] [address] [phone]           Duration of Discharge Encounter: Greater than 30 minutes including physician time.  Signed, Francis Dowse, PA-C 09/21/2017 10:35 AM   I have seen and examined this patient with Francis Dowse.  Agree with  above, note added to reflect my findings.  On exam, RRR, no murmurs, lungs clear. Medtronic ICD implanted for ischemic cardiomyopathy. CXR and interrogation without issue. Plan for discharge today with follow up in clinic.    Kyrell Ruacho M. Yenesis Even MD 09/24/2017 7:30 AM

## 2017-09-20 NOTE — Progress Notes (Signed)
ICD Criteria Current LVEF:25%. Within 12 months prior to implant: Yes  Heart failure history: Yes, Class II Cardiomyopathy history: Yes, Ischemic Cardiomyopathy - Prior MI. Atrial Fibrillation/Atrial Flutter: No. Ventricular tachycardia history: No. Cardiac arrest history: No. History of syndromes with risk of sudden death: No. Previous ICD: No. Current ICD indication: Primary PPM indication: No. Class I or II Bradycardia indication present: No Beta Blocker therapy for 3 or more months: Yes, prescribed.  Ace Inhibitor/ARB therapy for 3 or more months: Yes, prescribed.  

## 2017-09-21 ENCOUNTER — Ambulatory Visit (HOSPITAL_COMMUNITY): Payer: Medicare PPO

## 2017-09-21 ENCOUNTER — Encounter (HOSPITAL_COMMUNITY): Payer: Self-pay | Admitting: Cardiology

## 2017-09-21 DIAGNOSIS — I110 Hypertensive heart disease with heart failure: Secondary | ICD-10-CM | POA: Diagnosis not present

## 2017-09-21 DIAGNOSIS — I2510 Atherosclerotic heart disease of native coronary artery without angina pectoris: Secondary | ICD-10-CM | POA: Diagnosis not present

## 2017-09-21 DIAGNOSIS — I255 Ischemic cardiomyopathy: Secondary | ICD-10-CM | POA: Diagnosis not present

## 2017-09-21 DIAGNOSIS — I5022 Chronic systolic (congestive) heart failure: Secondary | ICD-10-CM | POA: Diagnosis not present

## 2017-09-21 MED FILL — Lidocaine HCl Local Inj 1%: INTRAMUSCULAR | Qty: 60 | Status: AC

## 2017-09-21 NOTE — Progress Notes (Signed)
Discharge patient home with daughter.  Discussed discharge instructions, follow up appointments and medications.  Sent instructions home with patient.  Included hand out in ICD after care with discharge paperwork

## 2017-09-21 NOTE — Plan of Care (Signed)
  Problem: Activity: Goal: Risk for activity intolerance will decrease Outcome: Completed/Met   Problem: Nutrition: Goal: Adequate nutrition will be maintained Outcome: Completed/Met   Problem: Coping: Goal: Level of anxiety will decrease Outcome: Completed/Met   Problem: Pain Managment: Goal: General experience of comfort will improve Outcome: Completed/Met

## 2017-10-03 ENCOUNTER — Ambulatory Visit (INDEPENDENT_AMBULATORY_CARE_PROVIDER_SITE_OTHER): Payer: Medicare PPO | Admitting: *Deleted

## 2017-10-03 DIAGNOSIS — I255 Ischemic cardiomyopathy: Secondary | ICD-10-CM

## 2017-10-03 LAB — CUP PACEART INCLINIC DEVICE CHECK
Battery Remaining Longevity: 132 mo
HighPow Impedance: 64 Ohm
Implantable Lead Implant Date: 20190418
Implantable Pulse Generator Implant Date: 20190418
Lead Channel Pacing Threshold Amplitude: 1 V
Lead Channel Pacing Threshold Pulse Width: 0.4 ms
Lead Channel Sensing Intrinsic Amplitude: 25.75 mV
Lead Channel Sensing Intrinsic Amplitude: 26 mV
Lead Channel Setting Pacing Pulse Width: 0.4 ms
Lead Channel Setting Sensing Sensitivity: 0.3 mV
MDC IDC LEAD LOCATION: 753860
MDC IDC MSMT BATTERY VOLTAGE: 3.04 V
MDC IDC MSMT LEADCHNL RV IMPEDANCE VALUE: 361 Ohm
MDC IDC MSMT LEADCHNL RV IMPEDANCE VALUE: 475 Ohm
MDC IDC SESS DTM: 20190501104922
MDC IDC SET LEADCHNL RV PACING AMPLITUDE: 3.5 V
MDC IDC STAT BRADY RV PERCENT PACED: 0 %

## 2017-10-03 NOTE — Progress Notes (Signed)
Wound check appointment. Steri-strips removed. Wound without redness or edema. Incision edges approximated, wound well healed. Normal device function. Thresholds, sensing, and impedances consistent with implant measurements. Device programmed at 3.5V for extra safety margin until 3 month visit. Histogram distribution appropriate for patient and level of activity. No mode switches or ventricular arrhythmias noted. Patient educated about wound care, arm mobility, lifting restrictions, shock plan. ROV 12/24/17 w/ WC  .

## 2017-10-23 ENCOUNTER — Telehealth: Payer: Self-pay | Admitting: Cardiology

## 2017-10-23 NOTE — Telephone Encounter (Signed)
Spoke with patient who states that on Friday night she was at a graduation party with a large group of people and heard her alert tone. She was unable to describe to me the alert tone or distinguish between a one long tone vs and oscillating alert. She states that she has not heard the tone since. Upon review of her remote transmission there are no alert conditions or indications of alert delivery. I explained this to patient. Patient verbalized understanding.

## 2017-10-23 NOTE — Telephone Encounter (Signed)
Patient called and stated that on Friday 10-19-17 she heard an alarm from her device. Pt has not heard the alert tone since then. Instructed pt to send a remote transmission w/ her home monitor.

## 2017-10-24 ENCOUNTER — Other Ambulatory Visit: Payer: Self-pay | Admitting: Cardiovascular Disease

## 2017-10-24 DIAGNOSIS — I739 Peripheral vascular disease, unspecified: Secondary | ICD-10-CM

## 2017-10-25 ENCOUNTER — Ambulatory Visit (HOSPITAL_COMMUNITY)
Admission: RE | Admit: 2017-10-25 | Discharge: 2017-10-25 | Disposition: A | Discharge status: Home / Self Care | Payer: Medicare PPO | Source: Ambulatory Visit | Attending: Internal Medicine | Admitting: Internal Medicine

## 2017-10-25 DIAGNOSIS — I739 Peripheral vascular disease, unspecified: Secondary | ICD-10-CM | POA: Diagnosis not present

## 2017-11-06 ENCOUNTER — Ambulatory Visit (INDEPENDENT_AMBULATORY_CARE_PROVIDER_SITE_OTHER): Payer: Medicare PPO | Admitting: Cardiovascular Disease

## 2017-11-06 ENCOUNTER — Encounter: Payer: Self-pay | Admitting: Cardiovascular Disease

## 2017-11-06 VITALS — BP 103/59 | HR 76 | Ht 66.5 in | Wt 132.8 lb

## 2017-11-06 DIAGNOSIS — I739 Peripheral vascular disease, unspecified: Secondary | ICD-10-CM

## 2017-11-06 DIAGNOSIS — E785 Hyperlipidemia, unspecified: Secondary | ICD-10-CM

## 2017-11-06 DIAGNOSIS — I5022 Chronic systolic (congestive) heart failure: Secondary | ICD-10-CM

## 2017-11-06 DIAGNOSIS — I2510 Atherosclerotic heart disease of native coronary artery without angina pectoris: Secondary | ICD-10-CM | POA: Diagnosis not present

## 2017-11-06 NOTE — Progress Notes (Signed)
Cardiology Office Note   Date:  11/06/2017   ID:  Patrick, Sohm 01/04/1946, MRN [id number]  PCP:  Carmin Richmond, MD  Cardiologist: Dr. [name]  No chief complaint on file.     History of Present Illness: Latoya Adams is a 72 y.o. female who was referred by Dr. [name] for evaluation and management of peripheral arterial disease. She has history of prolonged tobacco use and was hospitalized in July 2018 with non-ST elevation myocardial infarction.  Cardiac catheterization showed severe three-vessel coronary artery disease with an EF of 25%.  She underwent CABG .  She had recurrent large right pleural effusion that required Pleurx catheter placement.  She quit smoking after her myocardial infarction and surgery.  Ejection fraction remained low at 25% and she underwent an ICD placement in April. During cardiac rehab, she noticed limitations related to bilateral leg pain with exercise which was worse on the right side.  Ultimately, her symptoms gradually improved.  She finished cardiac rehab and currently denies significant claudication. She is known to have significant aortoiliac calcifications on previous testing. She underwent noninvasive vascular evaluation which showed normal ABI with mildly decreased toe pressure.  Aortoiliac duplex showed significant left common and external iliac artery disease.  Past Medical History:  Diagnosis Date  . AICD (automatic cardioverter/defibrillator) present 09/20/2017  . Alcohol abuse   . Anxiety   . CHF (congestive heart failure) (HCC)   . Coronary artery disease   . Depression   . Dyspnea    due to Pulmonary Effusion  . History of blood transfusion 2018   during CABG  . Hypertension   . Myocardial infarction [facility]) 12/2016    Past Surgical History:  Procedure Laterality Date  . CHEST TUBE INSERTION Right 03/06/2017   Procedure: INSERTION PLEURAL DRAINAGE CATHETER;  Surgeon: Kerin Perna, MD;  Location: [facility] OR;  Service:  Thoracic;  Laterality: Right;  . COLONOSCOPY    . CORONARY ARTERY BYPASS GRAFT N/A 12/08/2016   Procedure: CORONARY ARTERY BYPASS GRAFTING (CABG)x4 using left internal mammary artery and bilateral greater saphenous veins harvested endoscopically;  Surgeon: Kerin Perna, MD;  Location: [facility] OR;  Service: Open Heart Surgery;  Laterality: N/A;  . dental implants    . ICD IMPLANT  09/20/2017  . ICD IMPLANT N/A 09/20/2017   Procedure: ICD IMPLANT;  Surgeon: Regan Lemming, MD;  Location: [facility] INVASIVE CV LAB;  Service: Cardiovascular;  Laterality: N/A;  . IR THORACENTESIS ASP PLEURAL SPACE W/IMG GUIDE  01/24/2017  . LEFT HEART CATH AND CORONARY ANGIOGRAPHY N/A 12/03/2016   Procedure: Left Heart Cath and Coronary Angiography;  Surgeon: Tonny Bollman, MD;  Location: [facility] INVASIVE CV LAB;  Service: Cardiovascular;  Laterality: N/A;  . REMOVAL OF PLEURAL DRAINAGE CATHETER Right 06/12/2017   Procedure: REMOVAL OF PLEURAL DRAINAGE CATHETER;  Surgeon: Kerin Perna, MD;  Location: [facility] OR;  Service: Thoracic;  Laterality: Right;  . RIGHT HEART CATH N/A 12/05/2016   Procedure: Right Heart Cath;  Surgeon: Dolores Patty, MD;  Location: [facility] INVASIVE CV LAB;  Service: Cardiovascular;  Laterality: N/A;  . TEE WITHOUT CARDIOVERSION N/A 12/08/2016   Procedure: TRANSESOPHAGEAL ECHOCARDIOGRAM (TEE);  Surgeon: Donata Clay, Theron Arista, MD;  Location: [facility] OR;  Service: Open Heart Surgery;  Laterality: N/A;  . TUBAL LIGATION       Current Outpatient Medications  Medication Sig Dispense Refill  . aspirin 81 MG tablet Take 1 tablet (81 mg total) daily by mouth.    Marland Kitchen  atorvastatin (LIPITOR) 80 MG tablet Take 1 tablet (80 mg total) by mouth daily at 6 PM. 30 tablet 1  . buPROPion (WELLBUTRIN XL) 300 MG 24 hr tablet Take 300 mg by mouth daily.    . digoxin (LANOXIN) 0.125 MG tablet Take 1 tablet (0.125 mg total) by mouth daily. 90 tablet 3  . furosemide (LASIX) 40 MG tablet Take 1 tablet (40 mg total) by mouth 3 (three) times a  week. Every Mon, Wed and Friday.  Can take extra tab as needed (Patient taking differently: Take 40 mg by mouth every Monday, Wednesday, and Friday. Can take extra 40 mg if needed for fluid) 90 tablet 3  . potassium chloride SA (K-DUR,KLOR-CON) 20 MEQ tablet Take 1 tablet (20 mEq total) by mouth 3 (three) times a week. Every Mon, Wed and Friday.  Take extra tab when you take extra Furosemide (Patient taking differently: Take 20 mEq by mouth every Monday, Wednesday, and Friday. Take extra 20 MEQ when you take extra Furosemide)    . sacubitril-valsartan (ENTRESTO) 24-26 MG Take 1 tablet by mouth 2 (two) times daily. 180 tablet 3  . sertraline (ZOLOFT) 100 MG tablet Take 200 mg by mouth daily.    Marland Kitchen spironolactone (ALDACTONE) 25 MG tablet Take 1 tablet (25 mg total) by mouth daily. 90 tablet 3  . traMADol (ULTRAM) 50 MG tablet Take 1-2 tablets (50-100 mg total) by mouth every 4 (four) hours as needed for moderate pain. 30 tablet 0  . carvedilol (COREG) 12.5 MG tablet Take 1 tablet (12.5 mg total) by mouth 2 (two) times daily. 180 tablet 3  . nitroGLYCERIN (NITROSTAT) 0.4 MG SL tablet Place 1 tablet (0.4 mg total) under the tongue every 5 (five) minutes as needed for chest pain. 15 tablet 3   No current facility-administered medications for this visit.     Allergies:   Codeine    Social History:  The patient  reports that she quit smoking about 11 months ago. Her smoking use included cigarettes. She quit after 50.00 years of use. She has never used smokeless tobacco. She reports that she does not drink alcohol or use drugs.   Family History:  [name] family history includes CAD in her sister; Heart block in her father, mother, and sister.    ROS:  Please see the history of present illness.   Otherwise, review of systems are positive for none.   All other systems are reviewed and negative.    PHYSICAL EXAM: VS:  BP (!) 103/59   Pulse 76   Ht 5' 6.5" (1.689 m)   Wt 132 lb 12.8 oz (60.2 kg)    BMI 21.11 kg/m  , BMI Body mass index is 21.11 kg/m. GEN: Well nourished, well developed, in no acute distress  HEENT: normal  Neck: no JVD, carotid bruits, or masses Cardiac: RRR; no murmurs, rubs, or gallops,no edema  Respiratory:  clear to auscultation bilaterally, normal work of breathing GI: soft, nontender, nondistended, + BS MS: no deformity or atrophy  Skin: warm and dry, no rash Neuro:  Strength and sensation are intact Psych: euthymic mood, full affect Vascular: Femoral pulses  +2 on the right and +1 on the left.  Distal pulses are not palpable.   EKG:  EKG is not ordered today.   Recent Labs: 12/06/2016: TSH 4.875 12/09/2016: Magnesium 2.3 04/10/2017: ALT 14 05/07/2017: B Natriuretic Peptide 384.9 09/10/2017: BUN 10; Creatinine, Ser 0.81; Hemoglobin 11.2; Platelets 314; Potassium 4.6; Sodium 143    Lipid  Panel    Component Value Date/Time   CHOL 144 12/06/2016 0002   TRIG 117 12/06/2016 0002   HDL 32 (L) 12/06/2016 0002   CHOLHDL 4.5 12/06/2016 0002   VLDL 23 12/06/2016 0002   LDLCALC 89 12/06/2016 0002      Wt Readings from Last 3 Encounters:  11/06/17 132 lb 12.8 oz (60.2 kg)  09/21/17 129 lb (58.5 kg)  09/10/17 130 lb (59 kg)       PAD Screen 09/10/2017  Previous PAD dx? No  Previous surgical procedure? No  Pain with walking? No  Feet/toe relief with dangling? No  Painful, non-healing ulcers? No  Extremities discolored? No      ASSESSMENT AND PLAN:  1.  Peripheral arterial disease with bilateral leg claudication due to iliac disease: Surprisingly, she reports that her symptoms  were worse on the right side although by physical exam and testing it seems that her iliac disease is worse on the left side. Nonetheless, she reports gradual improvement in symptoms and currently she does not feel significantly limited by her leg pain.  Thus, I recommend continuing medical therapy for now and considering revascularization of her symptoms worsen.  She quit  smoking last year.  2.  Coronary artery disease involving native coronary arteries without angina: She is doing well overall.  Continue medical therapy  3.  Chronic systolic heart failure: Currently on furosemide every other day.  She is on optimal medical therapy.  4.  Hyperlipidemia: Continue high-dose atorvastatin with a target LDL of less than 70.    Disposition:   FU with me in 6 months  Signed,  Latoya Bears, MD  11/06/2017 11:45 AM    [facility]

## 2017-11-06 NOTE — Patient Instructions (Signed)
Medication Instructions: Your physician recommends that you continue on your current medications as directed. Please refer to the Current Medication list given to you today.  If you need a refill on your cardiac medications before your next appointment, please call your pharmacy.   Labwork: None  Procedures/Testing: None  Follow-Up: Your physician wants you to follow-up in 6 months with Dr. Arida. You will receive a reminder letter in the mail two months in advance. If you don't receive a letter, please call our office at [phone] to schedule this follow-up appointment.   Thank you for choosing [facility]!!      

## 2017-11-12 ENCOUNTER — Other Ambulatory Visit (HOSPITAL_COMMUNITY): Payer: Self-pay | Admitting: Student

## 2017-12-24 ENCOUNTER — Ambulatory Visit: Payer: Medicare PPO | Admitting: Cardiology

## 2017-12-24 ENCOUNTER — Encounter: Payer: Self-pay | Admitting: Cardiology

## 2017-12-24 VITALS — BP 112/62 | HR 64 | Ht 66.0 in | Wt 134.8 lb

## 2017-12-24 DIAGNOSIS — I25810 Atherosclerosis of coronary artery bypass graft(s) without angina pectoris: Secondary | ICD-10-CM

## 2017-12-24 DIAGNOSIS — I255 Ischemic cardiomyopathy: Secondary | ICD-10-CM | POA: Diagnosis not present

## 2017-12-24 DIAGNOSIS — I5022 Chronic systolic (congestive) heart failure: Secondary | ICD-10-CM

## 2017-12-24 LAB — CUP PACEART INCLINIC DEVICE CHECK
Battery Remaining Longevity: 130 mo
Battery Voltage: 3.04 V
Brady Statistic RV Percent Paced: 0.01 %
Date Time Interrogation Session: 20190722143330
HighPow Impedance: 71 Ohm
Implantable Lead Implant Date: 20190418
Implantable Lead Location: 753860
Implantable Pulse Generator Implant Date: 20190418
Lead Channel Impedance Value: 361 Ohm
Lead Channel Impedance Value: 475 Ohm
Lead Channel Pacing Threshold Amplitude: 1 V
Lead Channel Pacing Threshold Pulse Width: 0.4 ms
Lead Channel Sensing Intrinsic Amplitude: 30.875 mV
Lead Channel Setting Pacing Amplitude: 2.75 V
Lead Channel Setting Pacing Pulse Width: 0.4 ms
Lead Channel Setting Sensing Sensitivity: 0.3 mV

## 2017-12-24 NOTE — Patient Instructions (Addendum)
Medication Instructions:  Your physician recommends that you continue on your current medications as directed. Please refer to the Current Medication list given to you today.  *If you need a refill on your cardiac medications before your next appointment, please call your pharmacy*  Labwork: None ordered  Testing/Procedures: None ordered  Follow-Up: Remote monitoring is used to monitor your Pacemaker or ICD from home. This monitoring reduces the number of office visits required to check your device to one time per year. It allows us to keep an eye on the functioning of your device to ensure it is working properly. You are scheduled for a device check from home on 03/25/2018. You may send your transmission at any time that day. If you have a wireless device, the transmission will be sent automatically. After your physician reviews your transmission, you will receive a postcard with your next transmission date.  Your physician wants you to follow-up in: 9 months with Dr. Camnitz.  You will receive a reminder letter in the mail two months in advance. If you don't receive a letter, please call our office to schedule the follow-up appointment.  Thank you for choosing [facility]!!   Antawan Mchugh, RN [phone]     

## 2017-12-24 NOTE — Progress Notes (Signed)
Electrophysiology Office Note   Date:  12/24/2017   ID:  Latoya Adams, Latoya Adams September 14, 1945, MRN [id number]  PCP:  Carmin Richmond, MD  Cardiologist: Bensimhon Primary Electrophysiologist:  Hazley Dezeeuw Jorja Loa, MD    Chief Complaint  Patient presents with  . Defib Check    Nonischemic cardiomyopathy/Chronic systolic HF     History of Present Illness: Latoya Adams is a 72 y.o. female who is being seen today for the evaluation of ischemic cardiomyopathy at the request of Arvilla Meres. Presenting today for electrophysiology evaluation.  She has a history of coronary disease status post CABG x4 in 2018, chronic systolic heart failure with an EF of 24% by cardiac MRI, tobacco abuse, COPD, and anemia.  She was admitted on 12/03/16 with non-STEMI and a troponin of 8.5.  Catheterization showed severe multivessel disease and a depressed ejection fraction.  Cardiac MRI showed viability and a small apical scar.  She continues to do cardiac rehab 3 days a week.  She had a Medtronic ICD implanted 09/20/2017.  Today, denies symptoms of palpitations, chest pain, shortness of breath, orthopnea, PND, lower extremity edema, claudication, dizziness, presyncope, syncope, bleeding, or neurologic sequela. The patient is tolerating medications without difficulties.  All she is feeling well.  Her main complaint today is of fatigue and weakness.  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  She otherwise has no major complaint.   Past Medical History:  Diagnosis Date  . AICD (automatic cardioverter/defibrillator) present 09/20/2017  . Alcohol abuse   . Anxiety   . CHF (congestive heart failure) (HCC)   . Coronary artery disease   . Depression   . Dyspnea    due to Pulmonary Effusion  . History of blood transfusion 2018   during CABG  . Hypertension   . Myocardial infarction [facility]) 12/2016   Past Surgical History:  Procedure Laterality Date  . CHEST TUBE INSERTION Right 03/06/2017   Procedure: INSERTION  PLEURAL DRAINAGE CATHETER;  Surgeon: Kerin Perna, MD;  Location: [facility] OR;  Service: Thoracic;  Laterality: Right;  . COLONOSCOPY    . CORONARY ARTERY BYPASS GRAFT N/A 12/08/2016   Procedure: CORONARY ARTERY BYPASS GRAFTING (CABG)x4 using left internal mammary artery and bilateral greater saphenous veins harvested endoscopically;  Surgeon: Kerin Perna, MD;  Location: [facility] OR;  Service: Open Heart Surgery;  Laterality: N/A;  . dental implants    . ICD IMPLANT  09/20/2017  . ICD IMPLANT N/A 09/20/2017   Procedure: ICD IMPLANT;  Surgeon: Regan Lemming, MD;  Location: [facility] INVASIVE CV LAB;  Service: Cardiovascular;  Laterality: N/A;  . IR THORACENTESIS ASP PLEURAL SPACE W/IMG GUIDE  01/24/2017  . LEFT HEART CATH AND CORONARY ANGIOGRAPHY N/A 12/03/2016   Procedure: Left Heart Cath and Coronary Angiography;  Surgeon: Tonny Bollman, MD;  Location: [facility] INVASIVE CV LAB;  Service: Cardiovascular;  Laterality: N/A;  . REMOVAL OF PLEURAL DRAINAGE CATHETER Right 06/12/2017   Procedure: REMOVAL OF PLEURAL DRAINAGE CATHETER;  Surgeon: Kerin Perna, MD;  Location: [facility] OR;  Service: Thoracic;  Laterality: Right;  . RIGHT HEART CATH N/A 12/05/2016   Procedure: Right Heart Cath;  Surgeon: Dolores Patty, MD;  Location: [facility] INVASIVE CV LAB;  Service: Cardiovascular;  Laterality: N/A;  . TEE WITHOUT CARDIOVERSION N/A 12/08/2016   Procedure: TRANSESOPHAGEAL ECHOCARDIOGRAM (TEE);  Surgeon: Donata Clay, Theron Arista, MD;  Location: [facility] OR;  Service: Open Heart Surgery;  Laterality: N/A;  . TUBAL LIGATION  Current Outpatient Medications  Medication Sig Dispense Refill  . aspirin 81 MG tablet Take 1 tablet (81 mg total) daily by mouth.    Marland Kitchen atorvastatin (LIPITOR) 80 MG tablet Take 1 tablet (80 mg total) by mouth daily at 6 PM. 30 tablet 1  . buPROPion (WELLBUTRIN XL) 300 MG 24 hr tablet Take 300 mg by mouth daily.    . carvedilol (COREG) 12.5 MG tablet Take 1 tablet (12.5 mg total) by mouth 2 (two) times daily. 180  tablet 3  . digoxin (LANOXIN) 0.125 MG tablet TAKE 1 TABLET EVERY DAY 90 tablet 3  . furosemide (LASIX) 40 MG tablet Take 1 tablet (40 mg total) by mouth 3 (three) times a week. Every Mon, Wed and Friday.  Can take extra tab as needed (Patient taking differently: Take 40 mg by mouth every Monday, Wednesday, and Friday. Can take extra 40 mg if needed for fluid) 90 tablet 3  . nitroGLYCERIN (NITROSTAT) 0.4 MG SL tablet Place 1 tablet (0.4 mg total) under the tongue every 5 (five) minutes as needed for chest pain. 15 tablet 3  . potassium chloride SA (K-DUR,KLOR-CON) 20 MEQ tablet Take 1 tablet (20 mEq total) by mouth 3 (three) times a week. Every Mon, Wed and Friday.  Take extra tab when you take extra Furosemide (Patient taking differently: Take 20 mEq by mouth every Monday, Wednesday, and Friday. Take extra 20 MEQ when you take extra Furosemide)    . sacubitril-valsartan (ENTRESTO) 24-26 MG Take 1 tablet by mouth 2 (two) times daily. 180 tablet 3  . sertraline (ZOLOFT) 100 MG tablet Take 200 mg by mouth daily.    Marland Kitchen spironolactone (ALDACTONE) 25 MG tablet TAKE 1 TABLET EVERY DAY 90 tablet 3  . traMADol (ULTRAM) 50 MG tablet Take 1-2 tablets (50-100 mg total) by mouth every 4 (four) hours as needed for moderate pain. 30 tablet 0   No current facility-administered medications for this visit.     Allergies:   Codeine   Social History:  The patient  reports that she quit smoking about 12 months ago. Her smoking use included cigarettes. She quit after 50.00 years of use. She has never used smokeless tobacco. She reports that she does not drink alcohol or use drugs.   Family History:  [name] family history includes CAD in her sister; Heart block in her father, mother, and sister.    ROS:  Please see the history of present illness.   Otherwise, review of systems is positive for none.   All other systems are reviewed and negative.   PHYSICAL EXAM: VS:  BP 112/62   Pulse 64   Ht 5\' 6"  (1.676 m)    Wt 134 lb 12.8 oz (61.1 kg)   SpO2 97%   BMI 21.76 kg/m  , BMI Body mass index is 21.76 kg/m. GEN: Well nourished, well developed, in no acute distress  HEENT: normal  Neck: no JVD, carotid bruits, or masses Cardiac: RRR; no murmurs, rubs, or gallops,no edema  Respiratory:  clear to auscultation bilaterally, normal work of breathing GI: soft, nontender, nondistended, + BS MS: no deformity or atrophy  Skin: warm and dry, device site well healed Neuro:  Strength and sensation are intact Psych: euthymic mood, full affect  EKG:  EKG is ordered today. Personal review of the ekg ordered shows SR, rate 64  Personal review of the device interrogation today. Results in Paceart   Recent Labs: 04/10/2017: ALT 14 05/07/2017: B Natriuretic Peptide 384.9 09/10/2017: BUN  10; Creatinine, Ser 0.81; Hemoglobin 11.2; Platelets 314; Potassium 4.6; Sodium 143    Lipid Panel     Component Value Date/Time   CHOL 144 12/06/2016 0002   TRIG 117 12/06/2016 0002   HDL 32 (L) 12/06/2016 0002   CHOLHDL 4.5 12/06/2016 0002   VLDL 23 12/06/2016 0002   LDLCALC 89 12/06/2016 0002     Wt Readings from Last 3 Encounters:  12/24/17 134 lb 12.8 oz (61.1 kg)  11/06/17 132 lb 12.8 oz (60.2 kg)  09/21/17 129 lb (58.5 kg)      Other studies Reviewed: Additional studies/ records that were reviewed today include: TTE 09/03/17  Review of the above records today demonstrates:  - Left ventricle: LVEF is severely depressed at approximately 25%   with akinesis of the proximal septal wall; hypokinesis of the   inferior, mid /distal septal, anteror and anterolateral walls   COmpared to images from 2018 no significant change. The cavity   size was mildly dilated. Wall thickness was normal. Doppler   parameters are consistent with abnormal left ventricular   relaxation (grade 1 diastolic dysfunction). - Aortic valve: There was mild regurgitation. - Mitral valve: There was mild regurgitation.   ASSESSMENT AND  PLAN:  1.  Chronic systolic heart failure: On optimal medical therapy with Coreg, Entresto, and Aldactone.  Had a Medtronic ICD implanted 09/20/2017.  Vice functioning appropriately.  No changes.  2.  Coronary artery disease: This post CABG 09/08/2016.  Currently on aspirin.  3.  Tobacco abuse: Severe COPD by PFTs.  Is currently not smoking.  Current medicines are reviewed at length with the patient today.   The patient does not have concerns regarding her medicines.  The following changes were made today:  none  Labs/ tests ordered today include:  No orders of the defined types were placed in this encounter.    Disposition:   FU with Sharniece Gibbon 9 months  Signed, Kenda Kloehn Jorja Loa, MD  12/24/2017 11:20 AM     [facility] [address] [place] [address] [phone] (office) [phone] (fax)

## 2018-02-05 ENCOUNTER — Other Ambulatory Visit (HOSPITAL_COMMUNITY): Payer: Self-pay | Admitting: Student

## 2018-02-19 ENCOUNTER — Ambulatory Visit (HOSPITAL_COMMUNITY)
Admission: RE | Admit: 2018-02-19 | Discharge: 2018-02-19 | Disposition: A | Discharge status: Home / Self Care | Payer: Medicare PPO | Source: Ambulatory Visit | Attending: Internal Medicine | Admitting: Internal Medicine

## 2018-02-19 ENCOUNTER — Encounter (HOSPITAL_COMMUNITY): Payer: Self-pay | Admitting: Internal Medicine

## 2018-02-19 VITALS — BP 122/68 | HR 73 | Wt 135.6 lb

## 2018-02-19 DIAGNOSIS — Z951 Presence of aortocoronary bypass graft: Secondary | ICD-10-CM | POA: Insufficient documentation

## 2018-02-19 DIAGNOSIS — I5022 Chronic systolic (congestive) heart failure: Secondary | ICD-10-CM | POA: Insufficient documentation

## 2018-02-19 DIAGNOSIS — J449 Chronic obstructive pulmonary disease, unspecified: Secondary | ICD-10-CM | POA: Diagnosis not present

## 2018-02-19 DIAGNOSIS — Z95810 Presence of automatic (implantable) cardiac defibrillator: Secondary | ICD-10-CM | POA: Insufficient documentation

## 2018-02-19 DIAGNOSIS — I2510 Atherosclerotic heart disease of native coronary artery without angina pectoris: Secondary | ICD-10-CM

## 2018-02-19 DIAGNOSIS — F329 Major depressive disorder, single episode, unspecified: Secondary | ICD-10-CM | POA: Diagnosis not present

## 2018-02-19 DIAGNOSIS — Z885 Allergy status to narcotic agent status: Secondary | ICD-10-CM | POA: Diagnosis not present

## 2018-02-19 DIAGNOSIS — Z79899 Other long term (current) drug therapy: Secondary | ICD-10-CM | POA: Insufficient documentation

## 2018-02-19 DIAGNOSIS — I110 Hypertensive heart disease with heart failure: Secondary | ICD-10-CM | POA: Insufficient documentation

## 2018-02-19 DIAGNOSIS — Z7982 Long term (current) use of aspirin: Secondary | ICD-10-CM | POA: Insufficient documentation

## 2018-02-19 DIAGNOSIS — F419 Anxiety disorder, unspecified: Secondary | ICD-10-CM | POA: Insufficient documentation

## 2018-02-19 DIAGNOSIS — I739 Peripheral vascular disease, unspecified: Secondary | ICD-10-CM | POA: Diagnosis not present

## 2018-02-19 DIAGNOSIS — I252 Old myocardial infarction: Secondary | ICD-10-CM | POA: Diagnosis not present

## 2018-02-19 DIAGNOSIS — I214 Non-ST elevation (NSTEMI) myocardial infarction: Secondary | ICD-10-CM | POA: Insufficient documentation

## 2018-02-19 DIAGNOSIS — Z87891 Personal history of nicotine dependence: Secondary | ICD-10-CM | POA: Diagnosis not present

## 2018-02-19 LAB — BASIC METABOLIC PANEL
Anion gap: 11 (ref 5–15)
BUN: 13 mg/dL (ref 8–23)
CHLORIDE: 101 mmol/L (ref 98–111)
CO2: 26 mmol/L (ref 22–32)
Calcium: 8.9 mg/dL (ref 8.9–10.3)
Creatinine, Ser: 0.76 mg/dL (ref 0.44–1.00)
GFR calc non Af Amer: >60 mL/min (ref >60)
Glucose, Bld: 97 mg/dL (ref 70–99)
POTASSIUM: 4.1 mmol/L (ref 3.5–5.1)
Sodium: 138 mmol/L (ref 135–145)

## 2018-02-19 LAB — BRAIN NATRIURETIC PEPTIDE: B NATRIURETIC PEPTIDE 5: 220.1 pg/mL — AB (ref 0.0–100.0)

## 2018-02-19 NOTE — Progress Notes (Signed)
Advanced Heart Failure Clinic Note   Primary Care: Dr. Lois Huxley in [facility] PCP-Cardiologist: Arvilla Meres, MD   HPI:  Latoya Adams is a 72 y.o. female with history of CAD s/ CABG x 4 12/08/2016, chronic systolic CHF cMRI EF 24% with only small apex scar, s/p MDT ICD, tobacco use, COPD, and chronic anemia.   Pt admitted 12/03/2016 with NSTEMI, Troponin peak 8.5. Taken for urgent cath as below which showed severe multivessel disease and depressed EF. Echo with LVEF ~ 25%. cMRI viability study only showed small apical scar. Taken for CABG on 12/08/16. Pt recovery was relatively unremarkable. She tolerated adjustment of her HF medications and was sent to SNF on discharge. Discharge weight was 134 lbs.   Underwent Pleurex catheter for recurrent R pleural effusion on 03/06/17. Removed 06/12/2017  S/p MDT ICD with Dr Elberta Fortis 09/20/17.  She presents today for regular follow up. Last visit lasix decreased to MWF.Will take extra as needed.  Says she has good days and bad days. Main complaint is fatigue. Can do all ADLs as long as she takes her time but gets SOB if she is walking around the store. Can walk up 1 flight of steps slowly. No CP, orthopnea, PND or edema. SBP usually around 100. Gets dizzy if she stands up too quickly.   Echo 04/2017 EF 20-25% RV normal moderate MR Echo 09/03/2017: EF 20%, RV normal, mild MR, Mild AI.  Review of systems complete and found to be negative unless listed in HPI.    Past Medical History:  Diagnosis Date  . AICD (automatic cardioverter/defibrillator) present 09/20/2017  . Alcohol abuse   . Anxiety   . CHF (congestive heart failure) (HCC)   . Coronary artery disease   . Depression   . Dyspnea    due to Pulmonary Effusion  . History of blood transfusion 2018   during CABG  . Hypertension   . Myocardial infarction [facility]) 12/2016    Current Outpatient Medications  Medication Sig Dispense Refill  . aspirin 81 MG tablet Take 1 tablet (81 mg total)  daily by mouth.    Marland Kitchen atorvastatin (LIPITOR) 80 MG tablet TAKE 1 TABLET DAILY AT 6 PM 90 tablet 1  . buPROPion (WELLBUTRIN XL) 300 MG 24 hr tablet Take 300 mg by mouth daily.    . carvedilol (COREG) 12.5 MG tablet Take 1 tablet (12.5 mg total) by mouth 2 (two) times daily. 180 tablet 3  . digoxin (LANOXIN) 0.125 MG tablet TAKE 1 TABLET EVERY DAY 90 tablet 3  . furosemide (LASIX) 40 MG tablet Take 1 tablet (40 mg total) by mouth 3 (three) times a week. Every Mon, Wed and Friday.  Can take extra tab as needed (Patient taking differently: Take 40 mg by mouth every Monday, Wednesday, and Friday. Can take extra 40 mg if needed for fluid) 90 tablet 3  . nitroGLYCERIN (NITROSTAT) 0.4 MG SL tablet Place 1 tablet (0.4 mg total) under the tongue every 5 (five) minutes as needed for chest pain. 15 tablet 3  . potassium chloride SA (K-DUR,KLOR-CON) 20 MEQ tablet Take 1 tablet (20 mEq total) by mouth 3 (three) times a week. Every Mon, Wed and Friday.  Take extra tab when you take extra Furosemide (Patient taking differently: Take 20 mEq by mouth every Monday, Wednesday, and Friday. Take extra 20 MEQ when you take extra Furosemide)    . sacubitril-valsartan (ENTRESTO) 24-26 MG Take 1 tablet by mouth 2 (two) times daily. 180 tablet 3  .  sertraline (ZOLOFT) 100 MG tablet Take 200 mg by mouth daily.    Marland Kitchen spironolactone (ALDACTONE) 25 MG tablet TAKE 1 TABLET EVERY DAY 90 tablet 3  . traMADol (ULTRAM) 50 MG tablet Take 1-2 tablets (50-100 mg total) by mouth every 4 (four) hours as needed for moderate pain. 30 tablet 0   No current facility-administered medications for this visit.    Allergies  Allergen Reactions  . Codeine Hives    Social History   Socioeconomic History  . Marital status: Single    Spouse name: Not on file  . Number of children: Not on file  . Years of education: Not on file  . Highest education level: Not on file  Occupational History  . Not on file  Social Needs  . Financial resource  strain: Not on file  . Food insecurity:    Worry: Not on file    Inability: Not on file  . Transportation needs:    Medical: Not on file    Non-medical: Not on file  Tobacco Use  . Smoking status: Former Smoker    Years: 50.00    Types: Cigarettes    Last attempt to quit: 12/03/2016    Years since quitting: 1.2  . Smokeless tobacco: Never Used  Substance and Sexual Activity  . Alcohol use: No    Comment: Recovering  Alocholic  . Drug use: No  . Sexual activity: Not on file  Lifestyle  . Physical activity:    Days per week: Not on file    Minutes per session: Not on file  . Stress: Not on file  Relationships  . Social connections:    Talks on phone: Not on file    Gets together: Not on file    Attends religious service: Not on file    Active member of club or organization: Not on file    Attends meetings of clubs or organizations: Not on file    Relationship status: Not on file  . Intimate partner violence:    Fear of current or ex partner: Not on file    Emotionally abused: Not on file    Physically abused: Not on file    Forced sexual activity: Not on file  Other Topics Concern  . Not on file  Social History Narrative  . Not on file   Family History  Problem Relation Age of Onset  . Heart block Mother   . Heart block Father   . CAD Sister   . Heart block Sister   . Heart attack Neg Hx    Vitals:   02/19/18 1441  BP: 122/68  Pulse: 73  SpO2: 97%  Weight: 61.5 kg (135 lb 9.6 oz)      Wt Readings from Last 3 Encounters:  12/24/17 61.1 kg (134 lb 12.8 oz)  11/06/17 60.2 kg (132 lb 12.8 oz)  09/21/17 58.5 kg (129 lb)   PHYSICAL EXAM: General:  Well appearing. No resp difficulty HEENT: normal Neck: supple. no JVD. Carotids 2+ bilat; no bruits. No lymphadenopathy or thryomegaly appreciated. Cor: PMI nondisplaced. Regular rate & rhythm. No rubs, gallops or murmurs. Lungs: clear with decreased BS throughout Abdomen: soft, nontender, nondistended. No  hepatosplenomegaly. No bruits or masses. Good bowel sounds. Extremities: no cyanosis, clubbing, rash, edema Neuro: alert & orientedx3, cranial nerves grossly intact. moves all 4 extremities w/o difficulty. Affect pleasant   ASSESSMENT & PLAN:  1. Chronic systolic HF - Pre-op echo (7/18) EF 20-25% with normal RV - Pre-op cMRI (  7/18) EF 24% with only small scar in apex. RHC with normal CO and no PAH - s/p CABG x 4. 12/08/16 - Echo 04/2017 EF 20-25%. Echo 09/2017 EF 20%, RV normal. Now s/p MDT ICD. - Chronic NYHA III symptoms.  - Volume status looks good - Continue lasix 40 mg MWF and PRN  - Continue dig 0.125 mg daily. - Continue carvedilol 12.5 mg twice a day.  - Continue Entresto 24/26 mg BID. Home BP too soft to titrate.  - Continue spiro 25 mg daily. - Low threshold for CPX if symptoms do not improve.   - Reinforced fluid restriction to < 2 L daily, sodium restriction to less than 2000 mg daily, and the importance of daily weights.  - ICD interrogated personally. No VT/AF. Volume status minimally elevated but not above threshold. Activity level ~1hour per day. Encouraged more activity  2. NSTEMI/CAD - Critical pLAD disease with ulcerated plaque at bifurcation with D1 as well as RPDA 90% lesion.  RHC ok. PFTs with severe COPD. Carotid u/s ok.  - S/P CABG x4 on 12/08/16 . On ASA and statin.  - No s/s ischemia - Has finished cardiac rehab.  - Continue ASA 81 mg daily  3. Recurrent R pleural effusion - s/p Pleurex removal. Doing well. CXR 4/19 with mild costophrenic angle blunting but no significant effusion.  - Lungs clear today.   4. PAD/claudication  - No claudication currently - ABIs 09/2017: bilateral TBI abnormal.  - Aortoiliac 10/2017: significant left common and external iliac artery disease. VVS consult recommended. She saw Dr Kirke Corin 11/06/17 and wanted to continue medical therapy for now.  5. Tobacco use/COPD - PFTs show severe COPD.  - She remains abstinent from  cigarettes.   Arvilla Meres, MD  2:57 PM

## 2018-02-19 NOTE — Patient Instructions (Signed)
Labs today  We will contact you in 6 months to schedule your next appointment.  

## 2018-02-19 NOTE — Addendum Note (Signed)
Encounter addended by: Noralee Space, RN on: 02/19/2018 3:13 PM  Actions taken: Visit diagnoses modified, Order list changed, Diagnosis association updated, Sign clinical note

## 2018-03-07 ENCOUNTER — Other Ambulatory Visit: Payer: Self-pay | Admitting: Internal Medicine

## 2018-03-25 ENCOUNTER — Telehealth: Payer: Self-pay

## 2018-03-25 ENCOUNTER — Ambulatory Visit (INDEPENDENT_AMBULATORY_CARE_PROVIDER_SITE_OTHER): Payer: Medicare PPO | Admitting: *Deleted

## 2018-03-25 DIAGNOSIS — I255 Ischemic cardiomyopathy: Secondary | ICD-10-CM

## 2018-03-25 NOTE — Telephone Encounter (Signed)
LMOVM reminding pt to send remote transmission.   

## 2018-03-26 ENCOUNTER — Encounter: Payer: Self-pay | Admitting: Cardiology

## 2018-03-26 NOTE — Progress Notes (Signed)
Remote ICD transmission.   

## 2018-04-16 LAB — CUP PACEART REMOTE DEVICE CHECK
Battery Voltage: 3.02 V
Brady Statistic RV Percent Paced: 0 %
Date Time Interrogation Session: 20191021172319
HIGH POWER IMPEDANCE MEASURED VALUE: 71 Ohm
Lead Channel Impedance Value: 361 Ohm
Lead Channel Sensing Intrinsic Amplitude: 21.75 mV
Lead Channel Sensing Intrinsic Amplitude: 21.75 mV
Lead Channel Setting Pacing Amplitude: 2.5 V
Lead Channel Setting Pacing Pulse Width: 0.4 ms
MDC IDC LEAD IMPLANT DT: 20190418
MDC IDC LEAD LOCATION: 753860
MDC IDC MSMT BATTERY REMAINING LONGEVITY: 129 mo
MDC IDC MSMT LEADCHNL RV IMPEDANCE VALUE: 456 Ohm
MDC IDC MSMT LEADCHNL RV PACING THRESHOLD AMPLITUDE: 0.875 V
MDC IDC MSMT LEADCHNL RV PACING THRESHOLD PULSEWIDTH: 0.4 ms
MDC IDC PG IMPLANT DT: 20190418
MDC IDC SET LEADCHNL RV SENSING SENSITIVITY: 0.3 mV

## 2018-04-29 IMAGING — DX DG CHEST 2V
2 series · 2 of 2 positions shown · non-contrast
Comparison: Single-view of the chest 03/06/2017.

CLINICAL DATA: Status post CABG 12/08/2016. Pleural drainage
catheter was placed 03/06/2017.

EXAM:
CHEST  2 VIEW

[dg chest 2 view (1 of 2)]
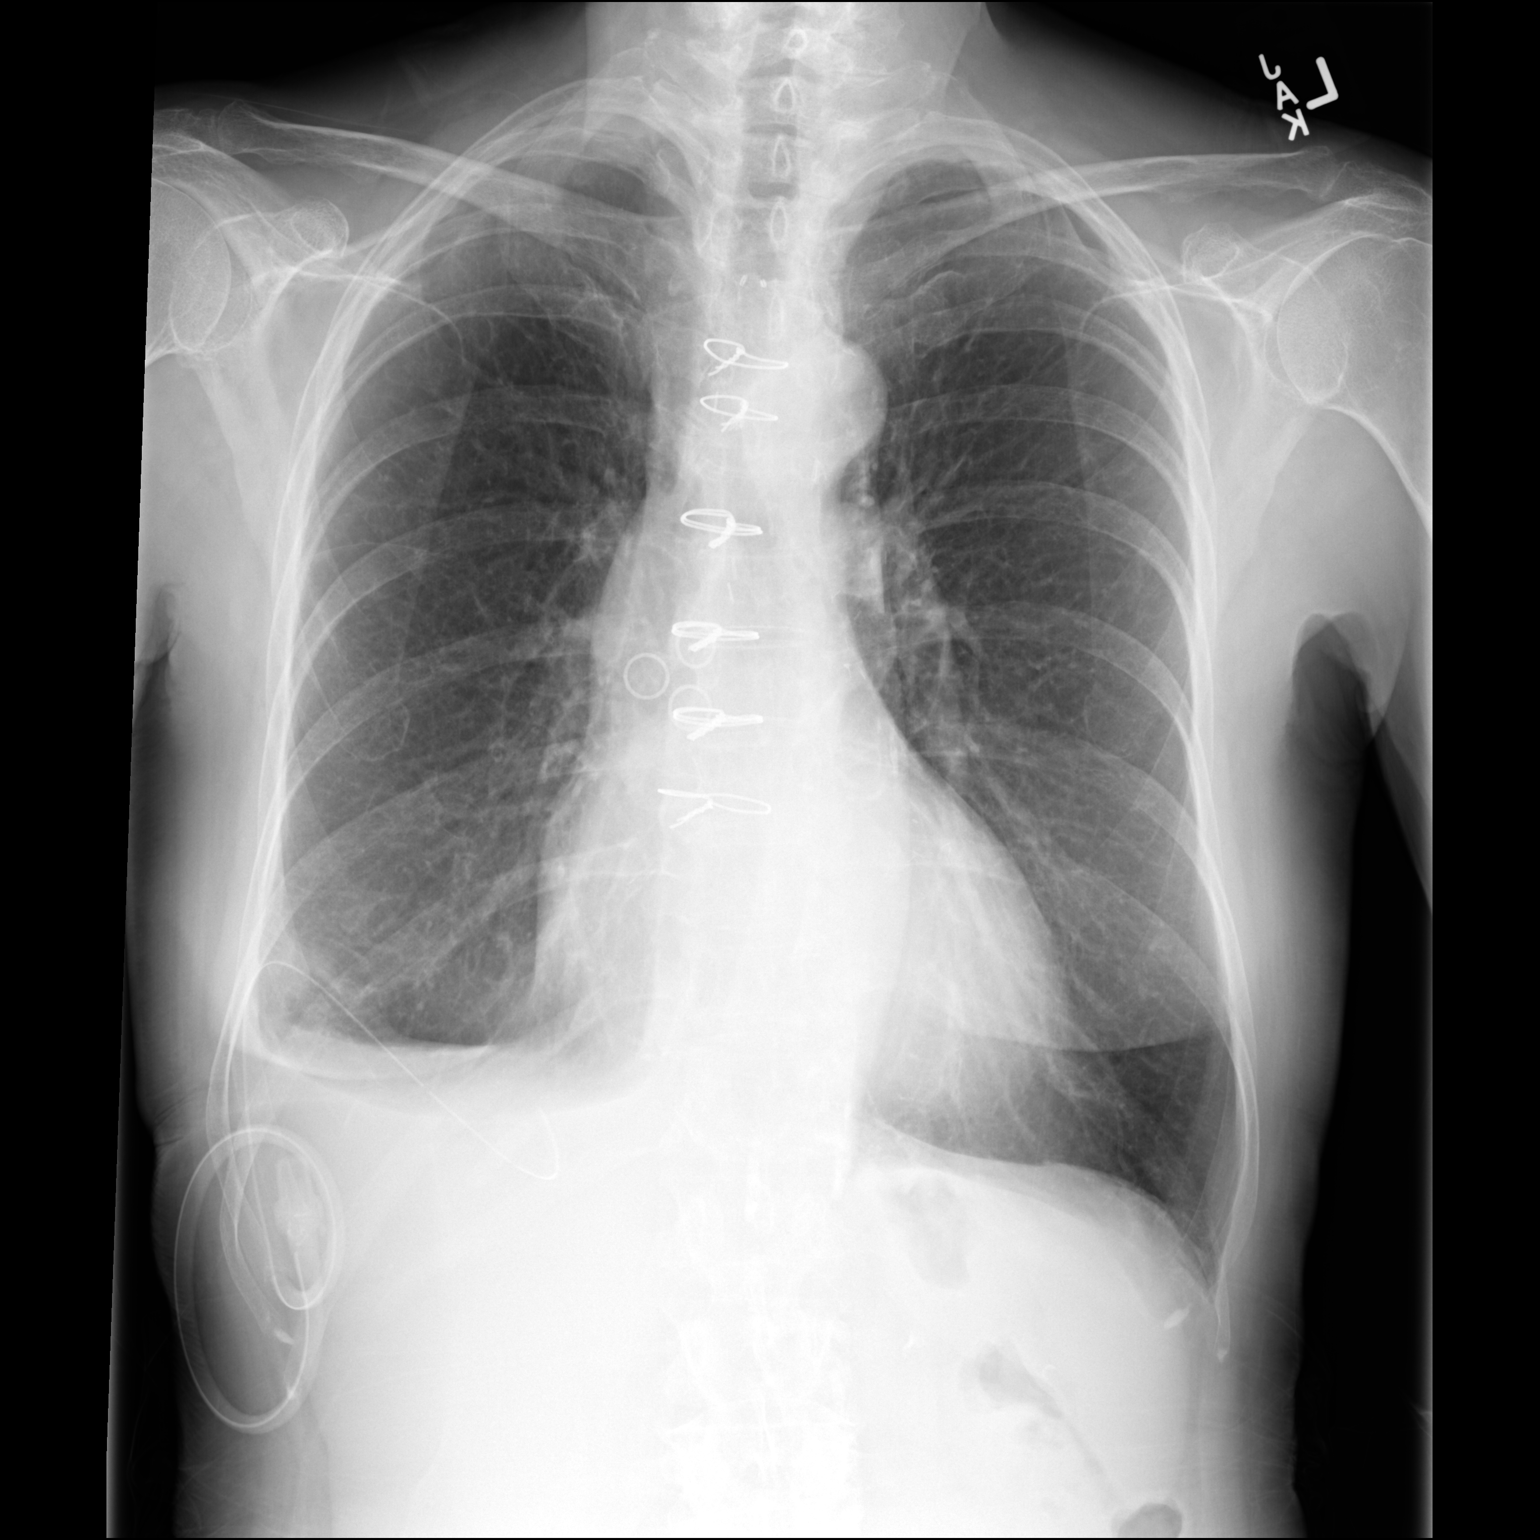

[dg chest 2 view (2 of 2)]
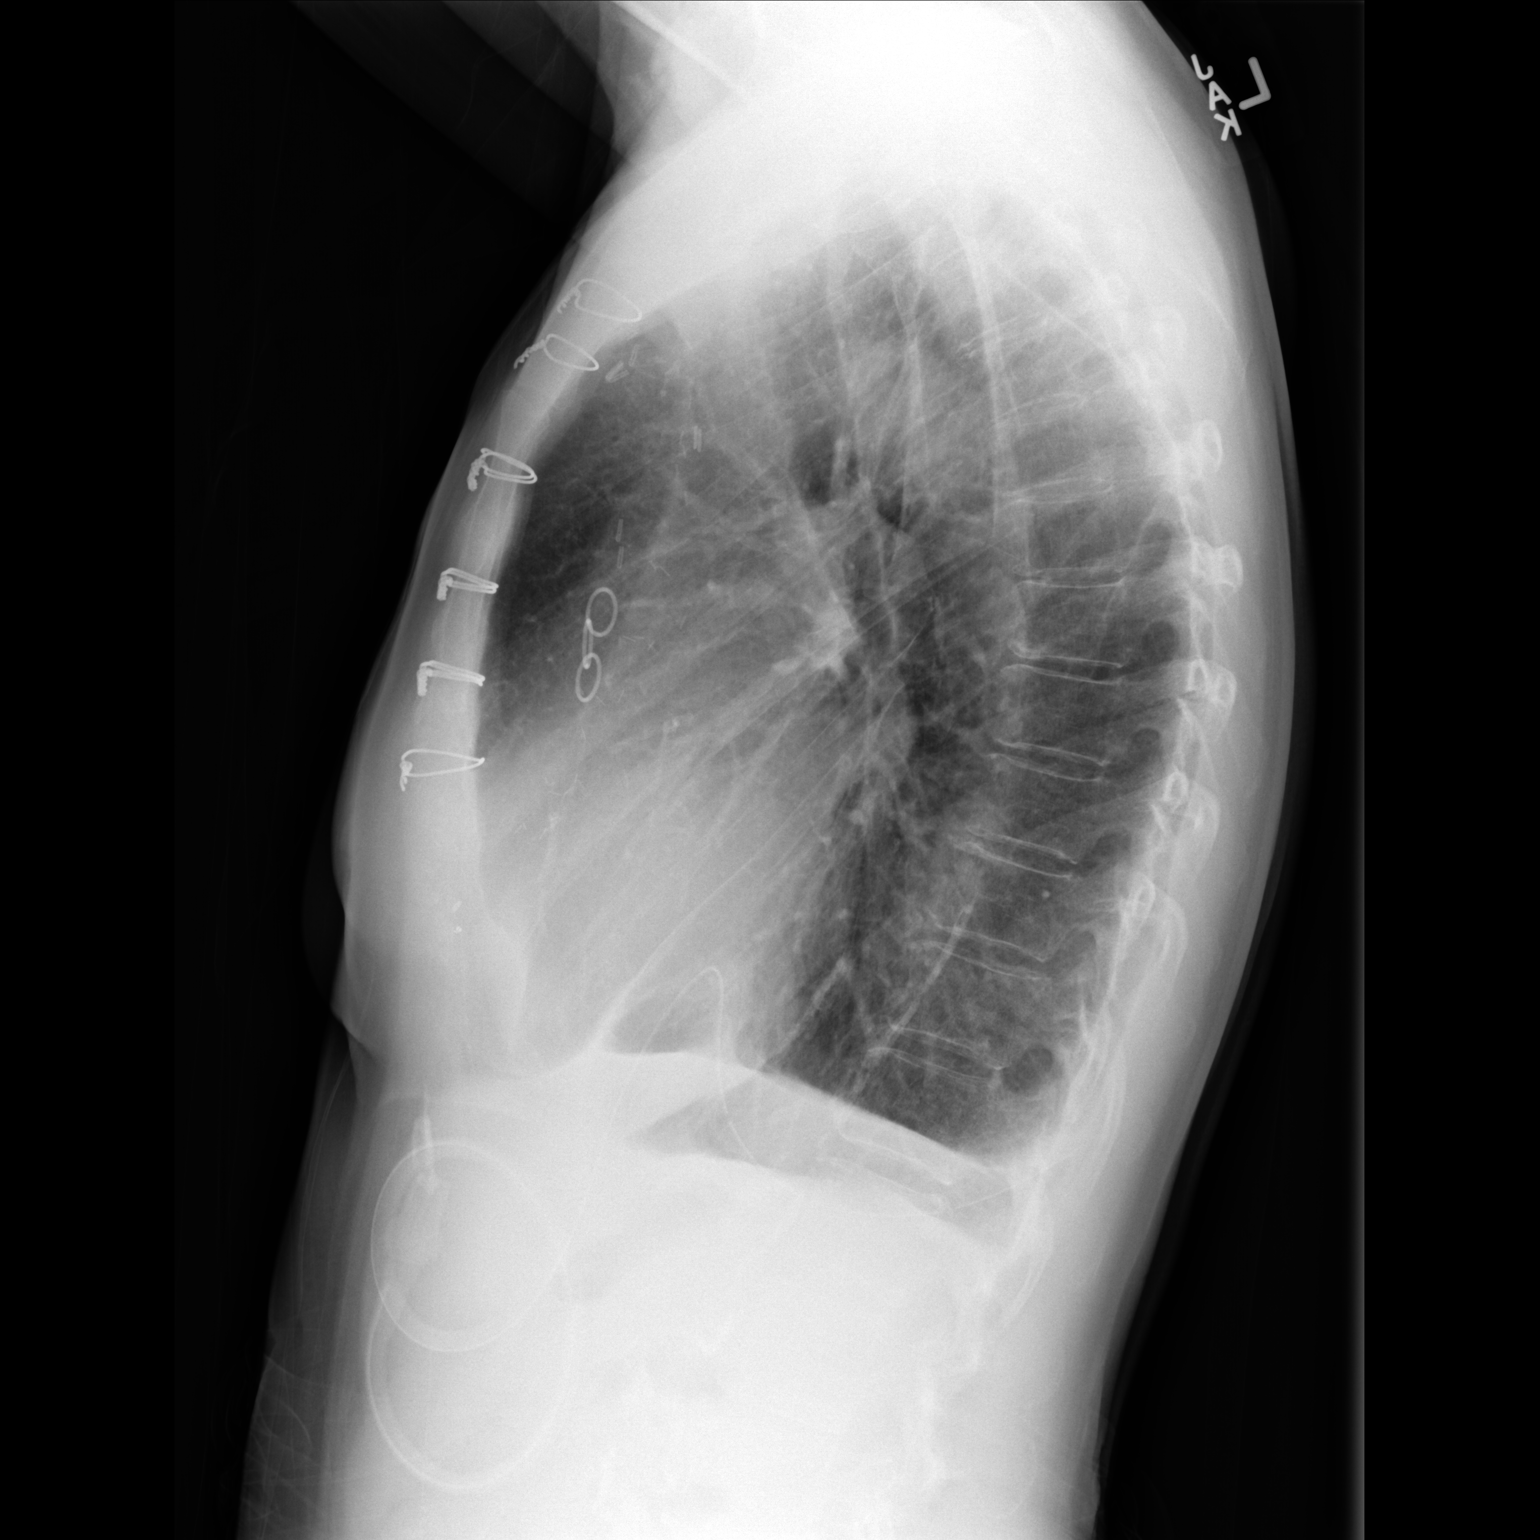

[2 of 2 positions shown; findings below may reference images not displayed]

FINDINGS: Pleural drainage catheter in lower right chest is seen. Tip of the
catheter was positioned in the mid chest on the comparison exam but
is now in the right base. There is a small right pleural effusion.
No pneumothorax is identified. The left lung is expanded and clear.
No left effusion. Heart size is normal. Six intact median sternotomy
wires are unchanged.
IMPRESSION: Small right pleural effusion with a drainage catheter in place. Tip
of the catheter is now in the right lung base as opposed to the
right mid chest on the prior exam. Negative for pneumothorax.

## 2018-05-09 ENCOUNTER — Other Ambulatory Visit (HOSPITAL_COMMUNITY): Payer: Self-pay | Admitting: Pharmacist

## 2018-05-09 MED ORDER — SACUBITRIL-VALSARTAN 24-26 MG PO TABS: 1.0000 | ORAL_TABLET | Freq: Two times a day (BID) | ORAL | 3 refills | Status: DC

## 2018-05-20 ENCOUNTER — Other Ambulatory Visit (HOSPITAL_COMMUNITY): Payer: Self-pay | Admitting: Student

## 2018-05-21 ENCOUNTER — Other Ambulatory Visit (HOSPITAL_COMMUNITY): Payer: Self-pay | Admitting: Student

## 2018-05-21 ENCOUNTER — Ambulatory Visit: Payer: Medicare PPO | Admitting: Cardiovascular Disease

## 2018-06-24 ENCOUNTER — Ambulatory Visit (INDEPENDENT_AMBULATORY_CARE_PROVIDER_SITE_OTHER): Payer: Medicare PPO

## 2018-06-24 DIAGNOSIS — I5022 Chronic systolic (congestive) heart failure: Secondary | ICD-10-CM

## 2018-06-24 DIAGNOSIS — I255 Ischemic cardiomyopathy: Secondary | ICD-10-CM

## 2018-06-25 LAB — CUP PACEART REMOTE DEVICE CHECK
Battery Remaining Longevity: 127 mo
Battery Voltage: 3.02 V
Brady Statistic RV Percent Paced: 0.01 %
HighPow Impedance: 72 Ohm
Implantable Lead Implant Date: 20190418
Implantable Lead Location: 753860
Implantable Pulse Generator Implant Date: 20190418
Lead Channel Impedance Value: 342 Ohm
Lead Channel Impedance Value: 456 Ohm
Lead Channel Pacing Threshold Amplitude: 1 V
Lead Channel Pacing Threshold Pulse Width: 0.4 ms
Lead Channel Sensing Intrinsic Amplitude: 21.375 mV
Lead Channel Sensing Intrinsic Amplitude: 21.375 mV
Lead Channel Setting Pacing Pulse Width: 0.4 ms
Lead Channel Setting Sensing Sensitivity: 0.3 mV
MDC IDC SESS DTM: 20200120051804
MDC IDC SET LEADCHNL RV PACING AMPLITUDE: 2.5 V

## 2018-06-25 NOTE — Progress Notes (Signed)
Remote ICD transmission.   

## 2018-07-10 IMAGING — CR DG CHEST 2V
2 series · 2 of 2 positions shown · non-contrast
Comparison: 05/02/2017 and earlier.

CLINICAL DATA: 71-year-old female status post CABG in [REDACTED],
thoracentesis in [REDACTED], and right chest tube placement in [REDACTED].

EXAM:
CHEST  2 VIEW

[w chest pa]
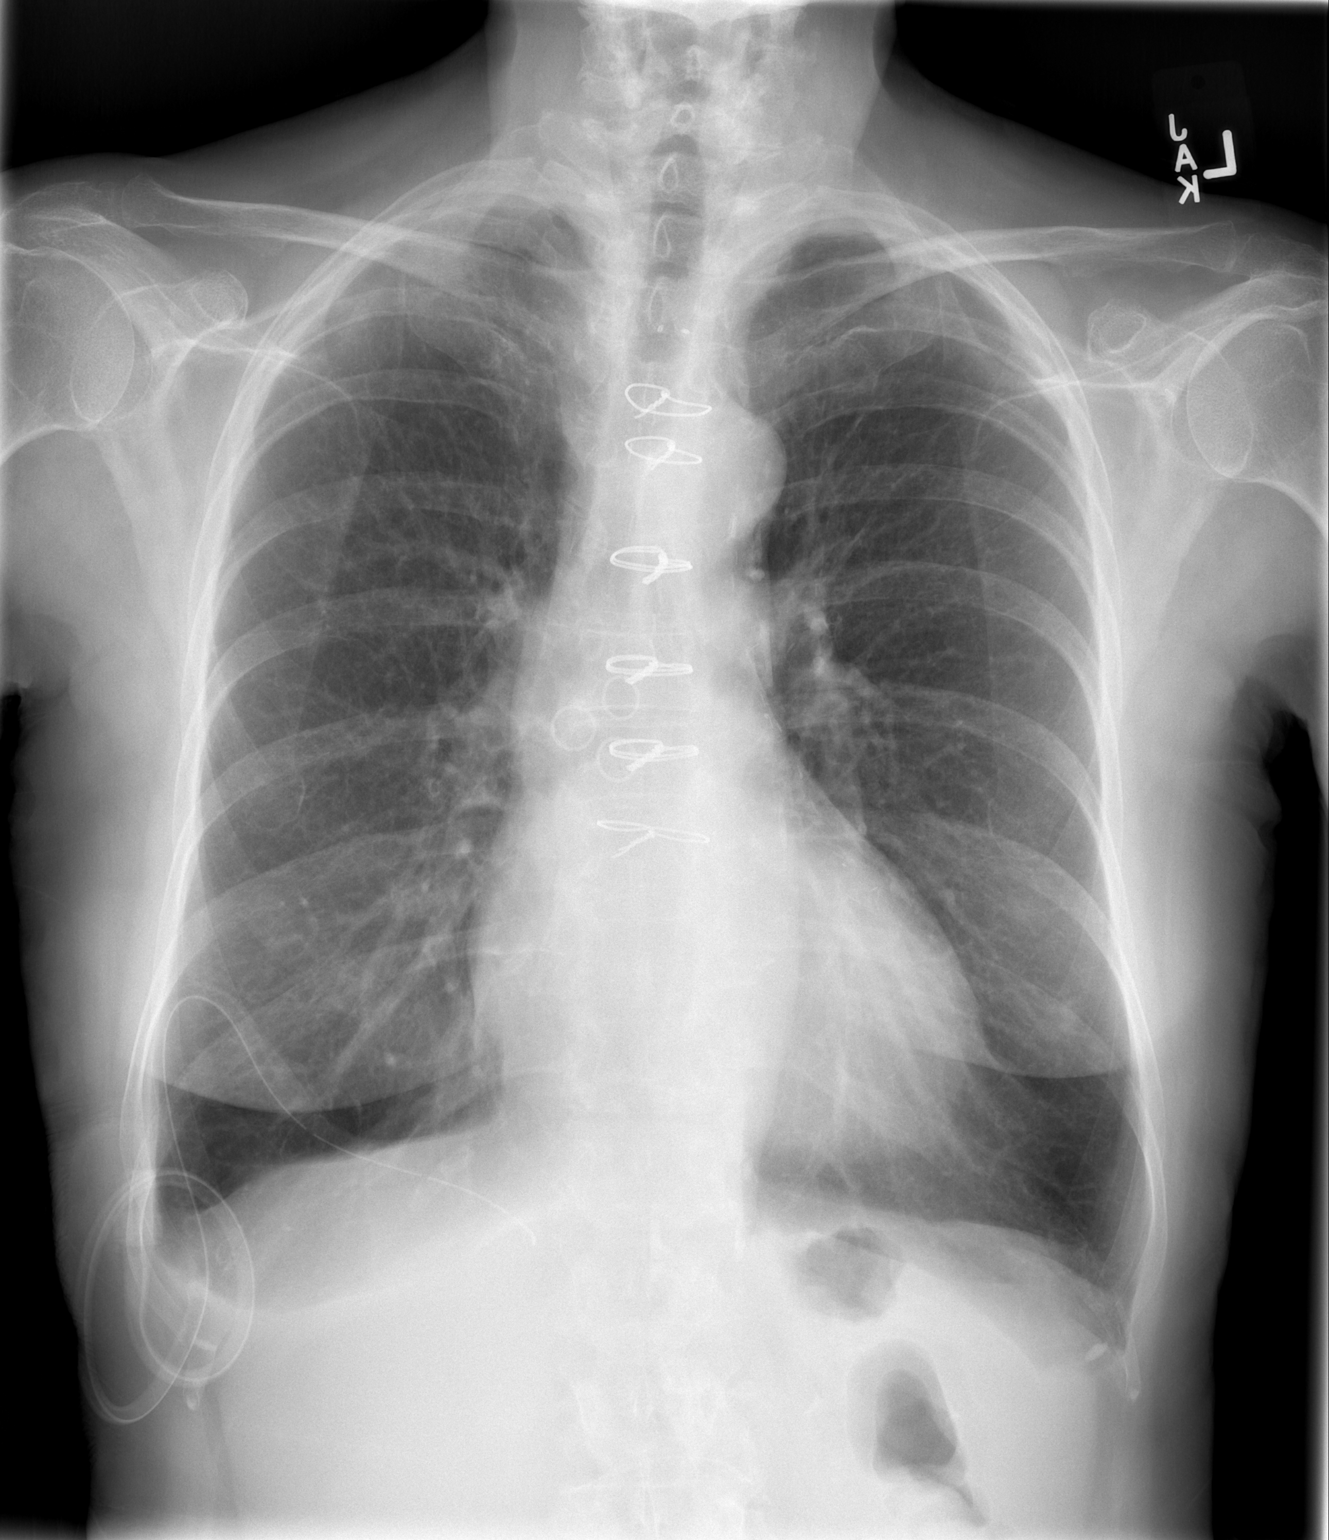

[w chest lat]
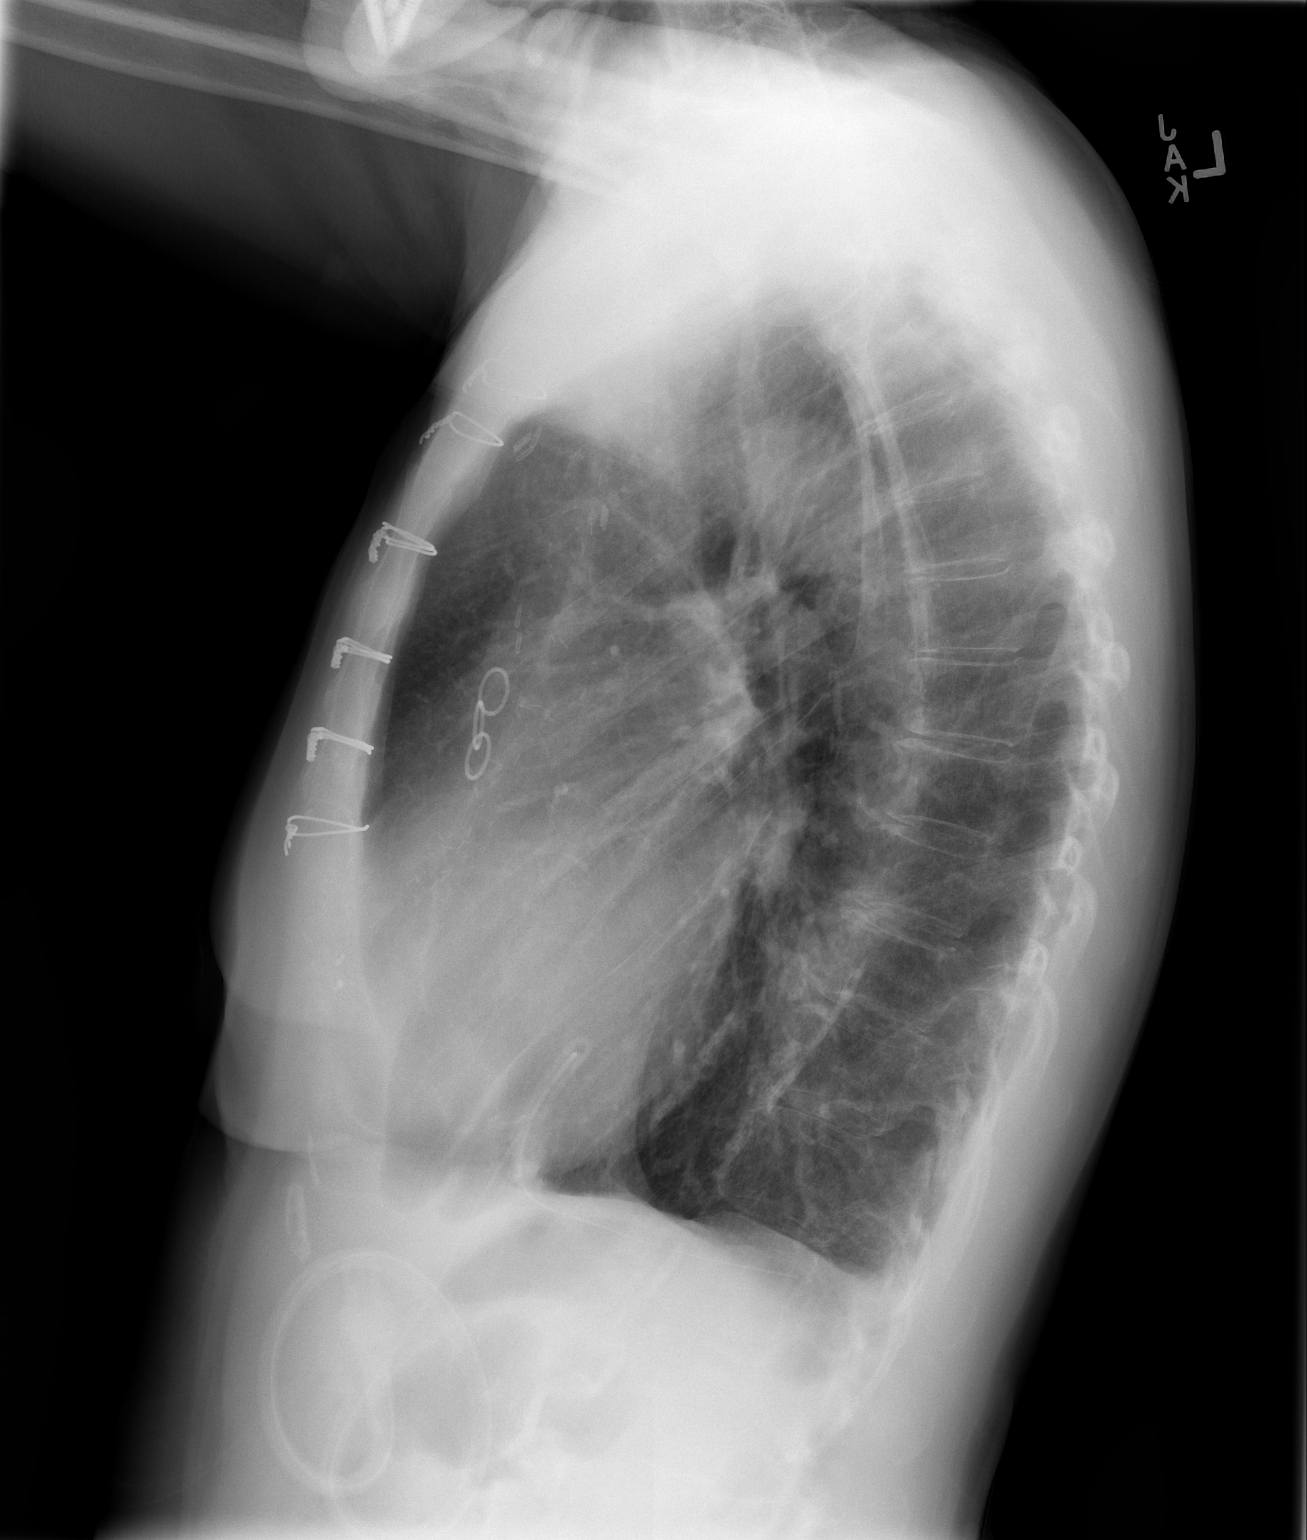

[2 of 2 positions shown; findings below may reference images not displayed]

FINDINGS: Stable configuration of the right lower chest pleural catheter.
Trace residual right pleural effusion appears stable since [REDACTED].
No pneumothorax.

Underlying large lung volumes. No pulmonary edema or new pulmonary
opacity. Stable cardiac size and mediastinal contours. Extensive
Calcified aortic atherosclerosis. Prior CABG. Visualized tracheal
air column is within normal limits. No acute osseous abnormality
identified. Negative visible bowel gas pattern.
IMPRESSION: Stable radiographic appearance of the chest since [REDACTED]. Stable
right pleural catheter with trace residual effusion.

## 2018-08-02 ENCOUNTER — Other Ambulatory Visit (HOSPITAL_COMMUNITY): Payer: Self-pay | Admitting: Student

## 2018-08-06 ENCOUNTER — Telehealth (HOSPITAL_COMMUNITY): Payer: Self-pay | Admitting: Licensed Clinical Social Worker

## 2018-08-06 NOTE — Telephone Encounter (Signed)
Pt returned CSW call regarding Entresto assistance through [employer]- able to renew pt grant:  Member ID: [id number] Group ID: [id number] RxBin ID: [id number] PCN: [id number] Eligibility Start Date: 06/13/2018 Eligibility End Date: 06/13/2019 Assistance Amount: $1,000.00  CSW will continue to follow and assist as needed  Burna Sis, LCSW Clinical Social Worker [facility] [phone]

## 2018-08-07 ENCOUNTER — Telehealth (HOSPITAL_COMMUNITY): Payer: Self-pay

## 2018-08-07 NOTE — Telephone Encounter (Signed)
Pt recently went to pcp and was told that digoxin level was too low at <0.4ng/ml. pcp suggested that she follow up with her cardiologist. Per andy pa-c this is the level that is desired for her. Made pt aware. Pt verbalized understanding. Reminded pt of 3/18 appointment.

## 2018-08-09 ENCOUNTER — Other Ambulatory Visit (HOSPITAL_COMMUNITY): Payer: Self-pay | Admitting: Internal Medicine

## 2018-08-09 ENCOUNTER — Other Ambulatory Visit (HOSPITAL_COMMUNITY): Payer: Self-pay | Admitting: Student

## 2018-08-12 ENCOUNTER — Encounter (HOSPITAL_COMMUNITY): Payer: Medicare PPO | Admitting: Internal Medicine

## 2018-08-21 ENCOUNTER — Encounter (HOSPITAL_COMMUNITY): Payer: Medicare PPO | Admitting: Internal Medicine

## 2018-08-26 NOTE — Progress Notes (Addendum)
Heart Failure TeleHealth Note  Due to national recommendations of social distancing due to COVID 19, telehealth visit is felt to be most appropriate for this patient at this time.  I discussed the limitations, risks, security and privacy concerns of performing an evaluation and management service by telephone and the availability of in person appointments. I also discussed with the patient that there may be a patient responsible charge related to this service. The patient expressed understanding and agreed to proceed.  ID:  Latoya Adams, DOB 1945-12-12, MRN [id number]  Location: Home  Provider location: [address] [place] Type of Visit: Established patient  PCP:  Carmin Richmond, MD  Cardiologist:  Arvilla Meres, MD Primary HF: Dr [name]  Chief Complaint: chronic systolic heart failure, CAD   History of Present Illness: Latoya Adams is a 73 y.o. female with history of CAD s/ CABG x 4 12/08/2016, chronic systolic CHF (EF 40-98% 09/2017) cMRI EF 24% with only small apex scar, s/p MDT ICD, tobacco use, COPD, hx of R pleural effusion with previous pleurex catheter, and chronic anemia.   Latoya Adams is a 73 y.o. female who presents via video conferencing for a telehealth visit today. She has been doing fine. She has limited mobility due to hip. She is getting steroids shots 4x/year to left hip. She plans to get hip surgery in the future and has been followed by [facility] ortho. She is not SOB getting around the house. She has rare dizziness with standing. This has been chronic and occurs about 3-4 times/month. Does not correlate with lasix days. No edema, orthopnea, or PND. She elevates her legs when sitting. No CP or bleeding. No claudication. No cough, fever, chills, or sweats. No sick contacts or contacts with people who have recently traveled, but has been around her grandchildren. She plans to limit contact with them for now. Taking all medications.  Weights stable 137-138  lbs. SBP 110s. Limits fluid and salt intake.    Vitals as taken on her home BP cuff, pulse ox, and scale:  BP: 111/64 Oxygen: 96% HR: 67 Weight: 139.2  Past Medical History:  Diagnosis Date  . AICD (automatic cardioverter/defibrillator) present 09/20/2017  . Alcohol abuse   . Anxiety   . CHF (congestive heart failure) (HCC)   . Coronary artery disease   . Depression   . Dyspnea    due to Pulmonary Effusion  . History of blood transfusion 2018   during CABG  . Hypertension   . Myocardial infarction [facility]) 12/2016   Past Surgical History:  Procedure Laterality Date  . CHEST TUBE INSERTION Right 03/06/2017   Procedure: INSERTION PLEURAL DRAINAGE CATHETER;  Surgeon: Kerin Perna, MD;  Location: [facility] OR;  Service: Thoracic;  Laterality: Right;  . COLONOSCOPY    . CORONARY ARTERY BYPASS GRAFT N/A 12/08/2016   Procedure: CORONARY ARTERY BYPASS GRAFTING (CABG)x4 using left internal mammary artery and bilateral greater saphenous veins harvested endoscopically;  Surgeon: Kerin Perna, MD;  Location[facility] OR;  Service: Open Heart Surgery;  Laterality: N/A;  . dental implants    . ICD IMPLANT  09/20/2017  . ICD IMPLANT N/A 09/20/2017   Procedure: ICD IMPLANT;  Surgeon: Regan Lemming, MD;  Location: [facility] INVASIVE CV LAB;  Service: Cardiovascular;  Laterality: N/A;  . IR THORACENTESIS ASP PLEURAL SPACE W/IMG GUIDE  01/24/2017  . LEFT HEART CATH AND CORONARY ANGIOGRAPHY N/A 12/03/2016   Procedure: Left Heart Cath and Coronary Angiography;  Surgeon: Tonny Bollman, MD;  Location: [facility] INVASIVE CV LAB;  Service: Cardiovascular;  Laterality: N/A;  . REMOVAL OF PLEURAL DRAINAGE CATHETER Right 06/12/2017   Procedure: REMOVAL OF PLEURAL DRAINAGE CATHETER;  Surgeon: Kerin Perna, MD;  Location: [facility] OR;  Service: Thoracic;  Laterality: Right;  . RIGHT HEART CATH N/A 12/05/2016   Procedure: Right Heart Cath;  Surgeon: Dolores Patty, MD;  Location: [facility] INVASIVE CV LAB;  Service: Cardiovascular;   Laterality: N/A;  . TEE WITHOUT CARDIOVERSION N/A 12/08/2016   Procedure: TRANSESOPHAGEAL ECHOCARDIOGRAM (TEE);  Surgeon: Donata Clay, Theron Arista, MD;  Location: [facility] OR;  Service: Open Heart Surgery;  Laterality: N/A;  . TUBAL LIGATION       Current Outpatient Medications  Medication Sig Dispense Refill  . alendronate (FOSAMAX) 70 MG tablet Take 70 mg by mouth once a week. Take with a full glass of water on an empty stomach.    Marland Kitchen aspirin 81 MG tablet Take 1 tablet (81 mg total) daily by mouth.    Marland Kitchen atorvastatin (LIPITOR) 80 MG tablet TAKE 1 TABLET DAILY AT 6 PM 90 tablet 0  . buPROPion (WELLBUTRIN XL) 300 MG 24 hr tablet Take 300 mg by mouth daily.    . carvedilol (COREG) 12.5 MG tablet TAKE 1 TABLET TWICE DAILY 180 tablet 3  . digoxin (LANOXIN) 0.125 MG tablet TAKE 1 TABLET EVERY DAY 90 tablet 3  . furosemide (LASIX) 40 MG tablet Take 1 tablet (40 mg total) by mouth every Monday, Wednesday, and Friday. Can take extra 40 mg if needed for fluid 15 tablet 0  . potassium chloride SA (K-DUR,KLOR-CON) 20 MEQ tablet Take 1 tablet (20 mEq total) by mouth 3 (three) times a week. Every Mon, Wed and Friday.  Take extra tab when you take extra Furosemide (Patient taking differently: Take 20 mEq by mouth every Monday, Wednesday, and Friday. Take extra 20 MEQ when you take extra Furosemide)    . sacubitril-valsartan (ENTRESTO) 24-26 MG Take 1 tablet by mouth 2 (two) times daily. 180 tablet 3  . sertraline (ZOLOFT) 100 MG tablet Take 200 mg by mouth daily.    Marland Kitchen spironolactone (ALDACTONE) 25 MG tablet TAKE 1 TABLET EVERY DAY 90 tablet 3  . nitroGLYCERIN (NITROSTAT) 0.4 MG SL tablet Place 1 tablet (0.4 mg total) under the tongue every 5 (five) minutes as needed for chest pain. (Patient not taking: Reported on 02/19/2018) 15 tablet 3  . traMADol (ULTRAM) 50 MG tablet Take 1-2 tablets (50-100 mg total) by mouth every 4 (four) hours as needed for moderate pain. 30 tablet 0   No current facility-administered medications for  this encounter.     Allergies:   Codeine   Social History:  The patient  reports that she quit smoking about 20 months ago. Her smoking use included cigarettes. She quit after 50.00 years of use. She has never used smokeless tobacco. She reports that she does not drink alcohol or use drugs.   Family History:  [name] family history includes CAD in her sister; Heart block in her father, mother, and sister.   ROS:  Please see the history of present illness.   All other systems are personally reviewed and negative.   Exam:  [facility] Call) Lungs: Normal respiratory effort with conversation.  Neuro: Alert & oriented x 3.   Recent Labs: 09/10/2017: Hemoglobin 11.2; Platelets 314 02/19/2018: B Natriuretic Peptide 220.1; BUN 13; Creatinine, Ser 0.76; Potassium 4.1; Sodium 138  Personally reviewed   Wt Readings from  Last 3 Encounters:  02/19/18 61.5 kg (135 lb 9.6 oz)  12/24/17 61.1 kg (134 lb 12.8 oz)  11/06/17 60.2 kg (132 lb 12.8 oz)     ASSESSMENT AND PLAN:  1. Chronic systolic HF due to ICM - Pre-op echo (7/18) EF 20-25% with normal RV - Pre-op cMRI (7/18) EF 24% with only small scar in apex. RHC with normal CO and no PAH - s/p CABG x 4. 12/08/16 - Echo 04/2017 EF 20-25%. Echo 09/2017 EF 20%, RV normal. S/p MDT ICD 09/2017. - Echo 09/2017 EF 25%, grade 1 DD, mild AI, mild MR - Chronic NYHA III symptoms, confounded by limited activity due to hip pain.  - Volume status sounds stable. - Continue lasix 40 mg MWF and PRN. Reviewed labs 07/05/18 (care everywhere): creatinine 0.60, K 4.3. Discussed that if she ends up needing to eat more processed foods, she will likely need to take more lasix.  - Continue dig 0.125 mg daily. Dig level stable <0.4 07/05/18.  - Continue carvedilol 12.5 mg twice a day.   - Continue Entresto 24/26 mg BID.   - Continue spiro 25 mg daily.   - Low threshold for CPX if symptoms do not improve.   - Reinforced fluid restriction to < 2 L daily, sodium  restriction to less than 2000 mg daily, and the importance of daily weights.  - Unable to interrogate [employer] device remotely.   2. NSTEMI/CAD - Critical pLAD disease with ulcerated plaque at bifurcation with D1 as well as RPDA 90% lesion. - S/P CABG x4 on 12/08/16 - No s/s ischemia. - Continue ASA 81 mg daily + statin.   3. Recurrent R pleural effusion - S/p Pleurex removal 06/2017. CXR 4/19 with mild costophrenic angle blunting but no significant effusion. No change.   4. PAD/claudication  - No claudication symptoms.  - ABIs 09/2017: bilateral TBI abnormal.  - Aortoiliac 10/2017: significant left common and external iliac artery disease. VVS consult recommended. She saw Dr Kirke Corin 11/06/17 and wanted to continue medical therapy for now.  5. Tobacco use/COPD - PFTs show severe COPD.  - No longer smoking.   6. Left Hip OA - Getting steroid shots 4x/year at [facility] ortho with improvement in mobility and pain - On fosamax now.  - Planning for potential hip surgery later this year. We discussed that she will need repeat echo for cardiac clearance. She would like Dr [name] to give her suggestions of orthopedic surgeons in the area, but does not plan on scheduling this at least until after her next appointment. Advised her to call us if this changes.   COVID screen  The patient does not have any symptoms that suggest any further testing/screening at this time.  Social distancing reinforced today.    Relevant cardiac medications were reviewed at length with the patient today.  The patient does not have concerns regarding their medications at this time.   Recommended follow-up: Keep appointment in May with Dr [name] for now. Add on echo to that appointment.  The following changes were made today: No medications changes. Provided 90 day refills on digoxin and lasix. She gets filled through [employer] order.   Labs/ tests ordered today include: None. Reviewed labs from 07/05/18 (care  everywhere)  Patient Risk: After full review of this patients clinical status, I feel that they are at moderate risk for cardiac decompensation at this time.  Today, I have spent 24 minutes in counseling/coordination of care regarding disease state education, salt/fluid restriction, sliding  scale diuretics, and medication compliance. Advised her to call us if she has any concerns or questions.   Signed, Alford Highland, NP  08/27/2018 9:16 AM  [facility] [address] Heart and Vascular [place] [address] [phone] (office) [phone] (fax)

## 2018-08-27 ENCOUNTER — Ambulatory Visit (HOSPITAL_COMMUNITY)
Admission: RE | Admit: 2018-08-27 | Discharge: 2018-08-27 | Disposition: A | Discharge status: Home / Self Care | Payer: Medicare PPO | Source: Ambulatory Visit | Attending: Cardiology | Admitting: Cardiology

## 2018-08-27 ENCOUNTER — Other Ambulatory Visit: Payer: Self-pay

## 2018-08-27 ENCOUNTER — Other Ambulatory Visit (HOSPITAL_COMMUNITY): Payer: Self-pay

## 2018-08-27 VITALS — BP 111/64 | HR 67 | Wt 139.0 lb

## 2018-08-27 DIAGNOSIS — I2510 Atherosclerotic heart disease of native coronary artery without angina pectoris: Secondary | ICD-10-CM

## 2018-08-27 DIAGNOSIS — I739 Peripheral vascular disease, unspecified: Secondary | ICD-10-CM | POA: Diagnosis not present

## 2018-08-27 DIAGNOSIS — I5022 Chronic systolic (congestive) heart failure: Secondary | ICD-10-CM

## 2018-08-27 DIAGNOSIS — Z951 Presence of aortocoronary bypass graft: Secondary | ICD-10-CM | POA: Diagnosis not present

## 2018-08-27 DIAGNOSIS — I255 Ischemic cardiomyopathy: Secondary | ICD-10-CM | POA: Diagnosis not present

## 2018-08-27 MED ORDER — DIGOXIN 125 MCG PO TABS: 125.0000 ug | ORAL_TABLET | Freq: Every day | ORAL | 3 refills | Status: DC

## 2018-08-27 MED ORDER — FUROSEMIDE 40 MG PO TABS: 40.0000 mg | ORAL_TABLET | ORAL | 0 refills | Status: DC

## 2018-08-27 NOTE — Addendum Note (Signed)
Encounter addended by: Alford Highland, NP on: 08/27/2018 9:57 AM  Actions taken: Clinical Note Signed

## 2018-09-23 ENCOUNTER — Ambulatory Visit (INDEPENDENT_AMBULATORY_CARE_PROVIDER_SITE_OTHER): Payer: Medicare PPO | Admitting: *Deleted

## 2018-09-23 ENCOUNTER — Other Ambulatory Visit: Payer: Self-pay

## 2018-09-23 DIAGNOSIS — I255 Ischemic cardiomyopathy: Secondary | ICD-10-CM

## 2018-09-23 LAB — CUP PACEART REMOTE DEVICE CHECK
Battery Remaining Longevity: 125 mo
Battery Voltage: 3.01 V
Brady Statistic RV Percent Paced: 0 %
Date Time Interrogation Session: 20200420103426
HighPow Impedance: 70 Ohm
Implantable Lead Implant Date: 20190418
Implantable Lead Location: 753860
Implantable Pulse Generator Implant Date: 20190418
Lead Channel Impedance Value: 342 Ohm
Lead Channel Impedance Value: 399 Ohm
Lead Channel Pacing Threshold Amplitude: 0.875 V
Lead Channel Pacing Threshold Pulse Width: 0.4 ms
Lead Channel Sensing Intrinsic Amplitude: 21.375 mV
Lead Channel Sensing Intrinsic Amplitude: 21.375 mV
Lead Channel Setting Pacing Amplitude: 2.5 V
Lead Channel Setting Pacing Pulse Width: 0.4 ms
Lead Channel Setting Sensing Sensitivity: 0.3 mV

## 2018-10-01 ENCOUNTER — Encounter: Payer: Self-pay | Admitting: Cardiology

## 2018-10-01 NOTE — Progress Notes (Signed)
Remote ICD transmission.   

## 2018-10-17 ENCOUNTER — Other Ambulatory Visit (HOSPITAL_COMMUNITY): Payer: Medicare PPO

## 2018-10-17 ENCOUNTER — Encounter (HOSPITAL_COMMUNITY): Payer: Medicare PPO | Admitting: Internal Medicine

## 2018-10-29 ENCOUNTER — Other Ambulatory Visit (HOSPITAL_COMMUNITY): Payer: Self-pay

## 2018-10-29 MED ORDER — FUROSEMIDE 40 MG PO TABS: 40.0000 mg | ORAL_TABLET | ORAL | 2 refills | Status: DC

## 2018-10-30 IMAGING — DX DG CHEST 2V
2 series · 2 of 2 positions shown · non-contrast
Comparison: 07/11/2017.

CLINICAL DATA: AICD placement.

EXAM:
CHEST - 2 VIEW

[chest pa]
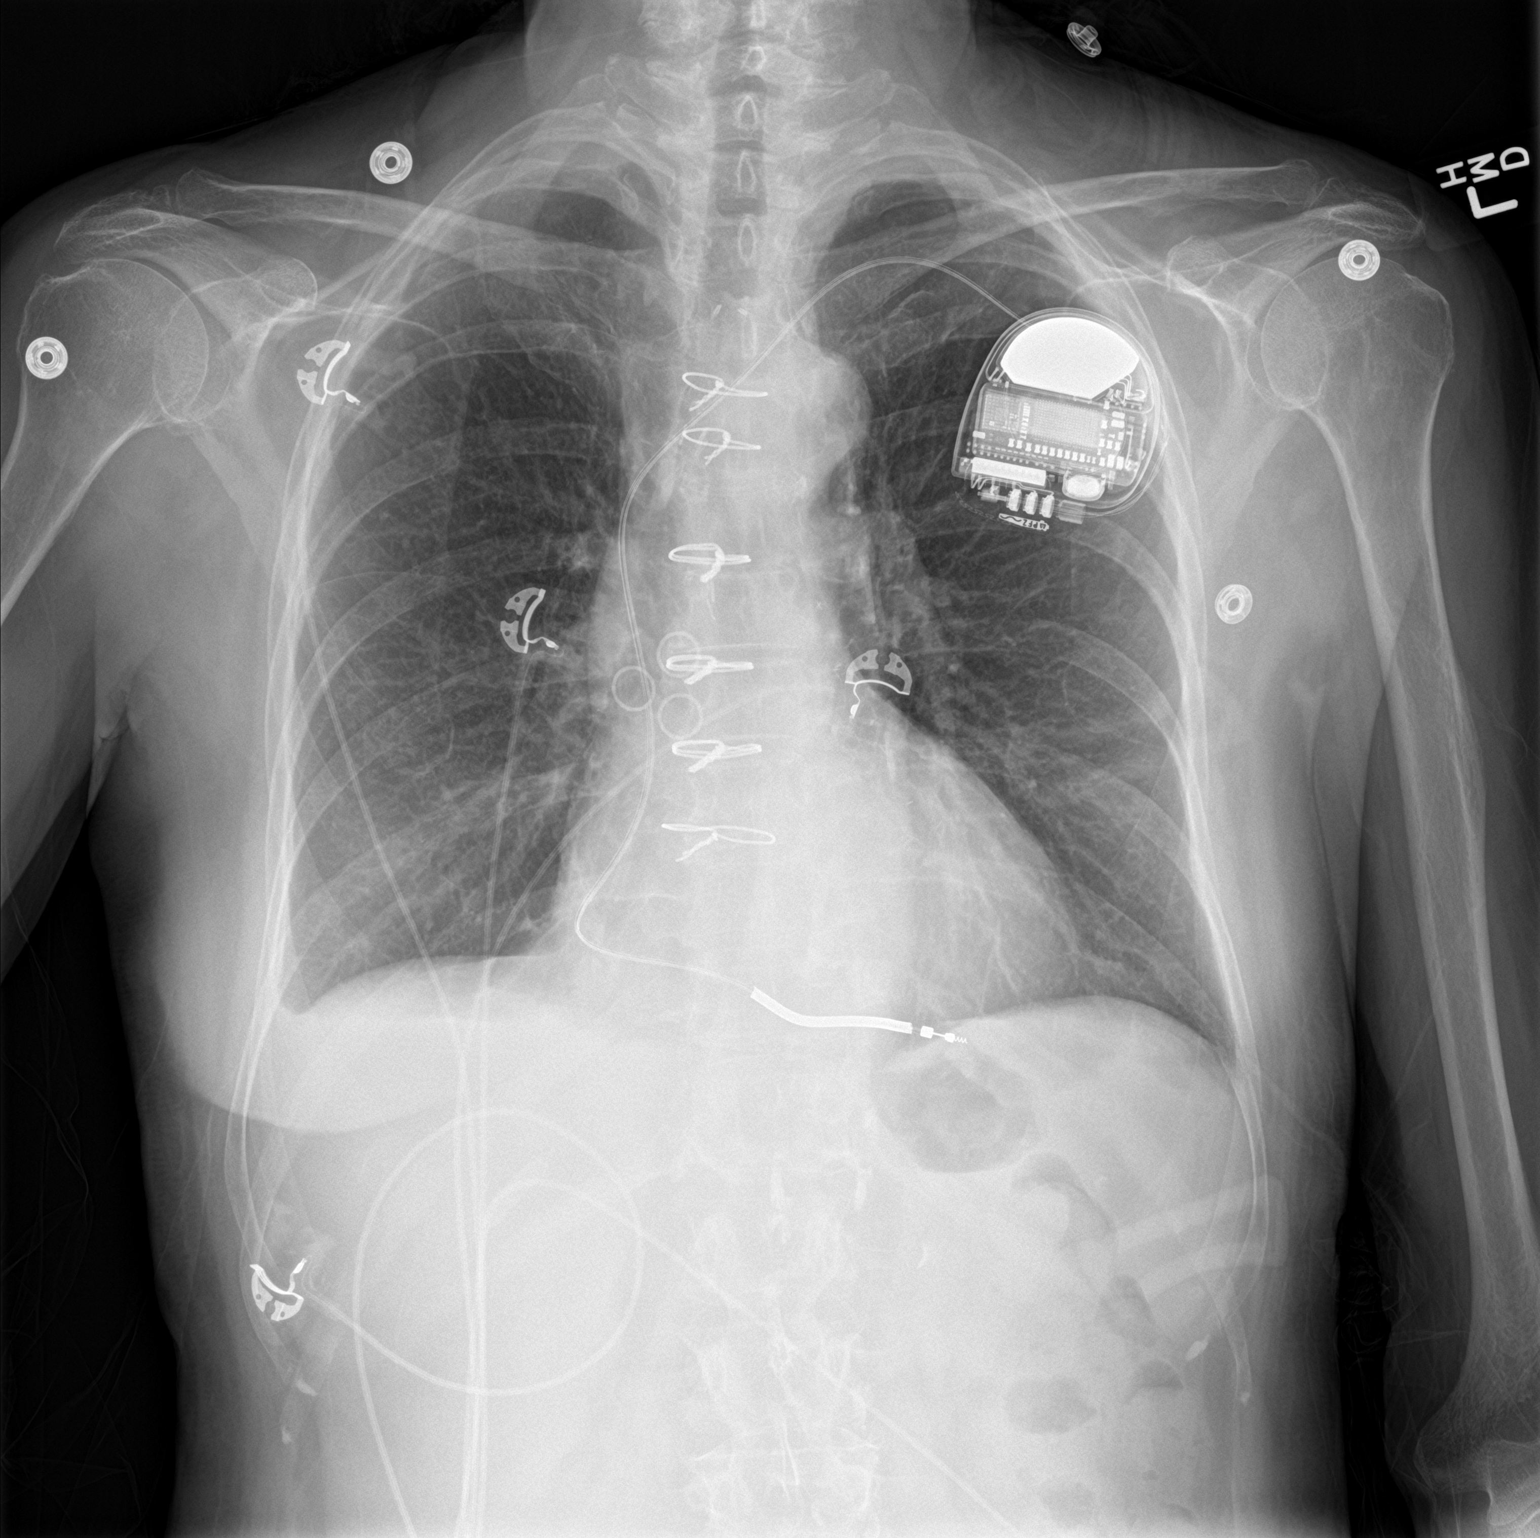

[chest lat]
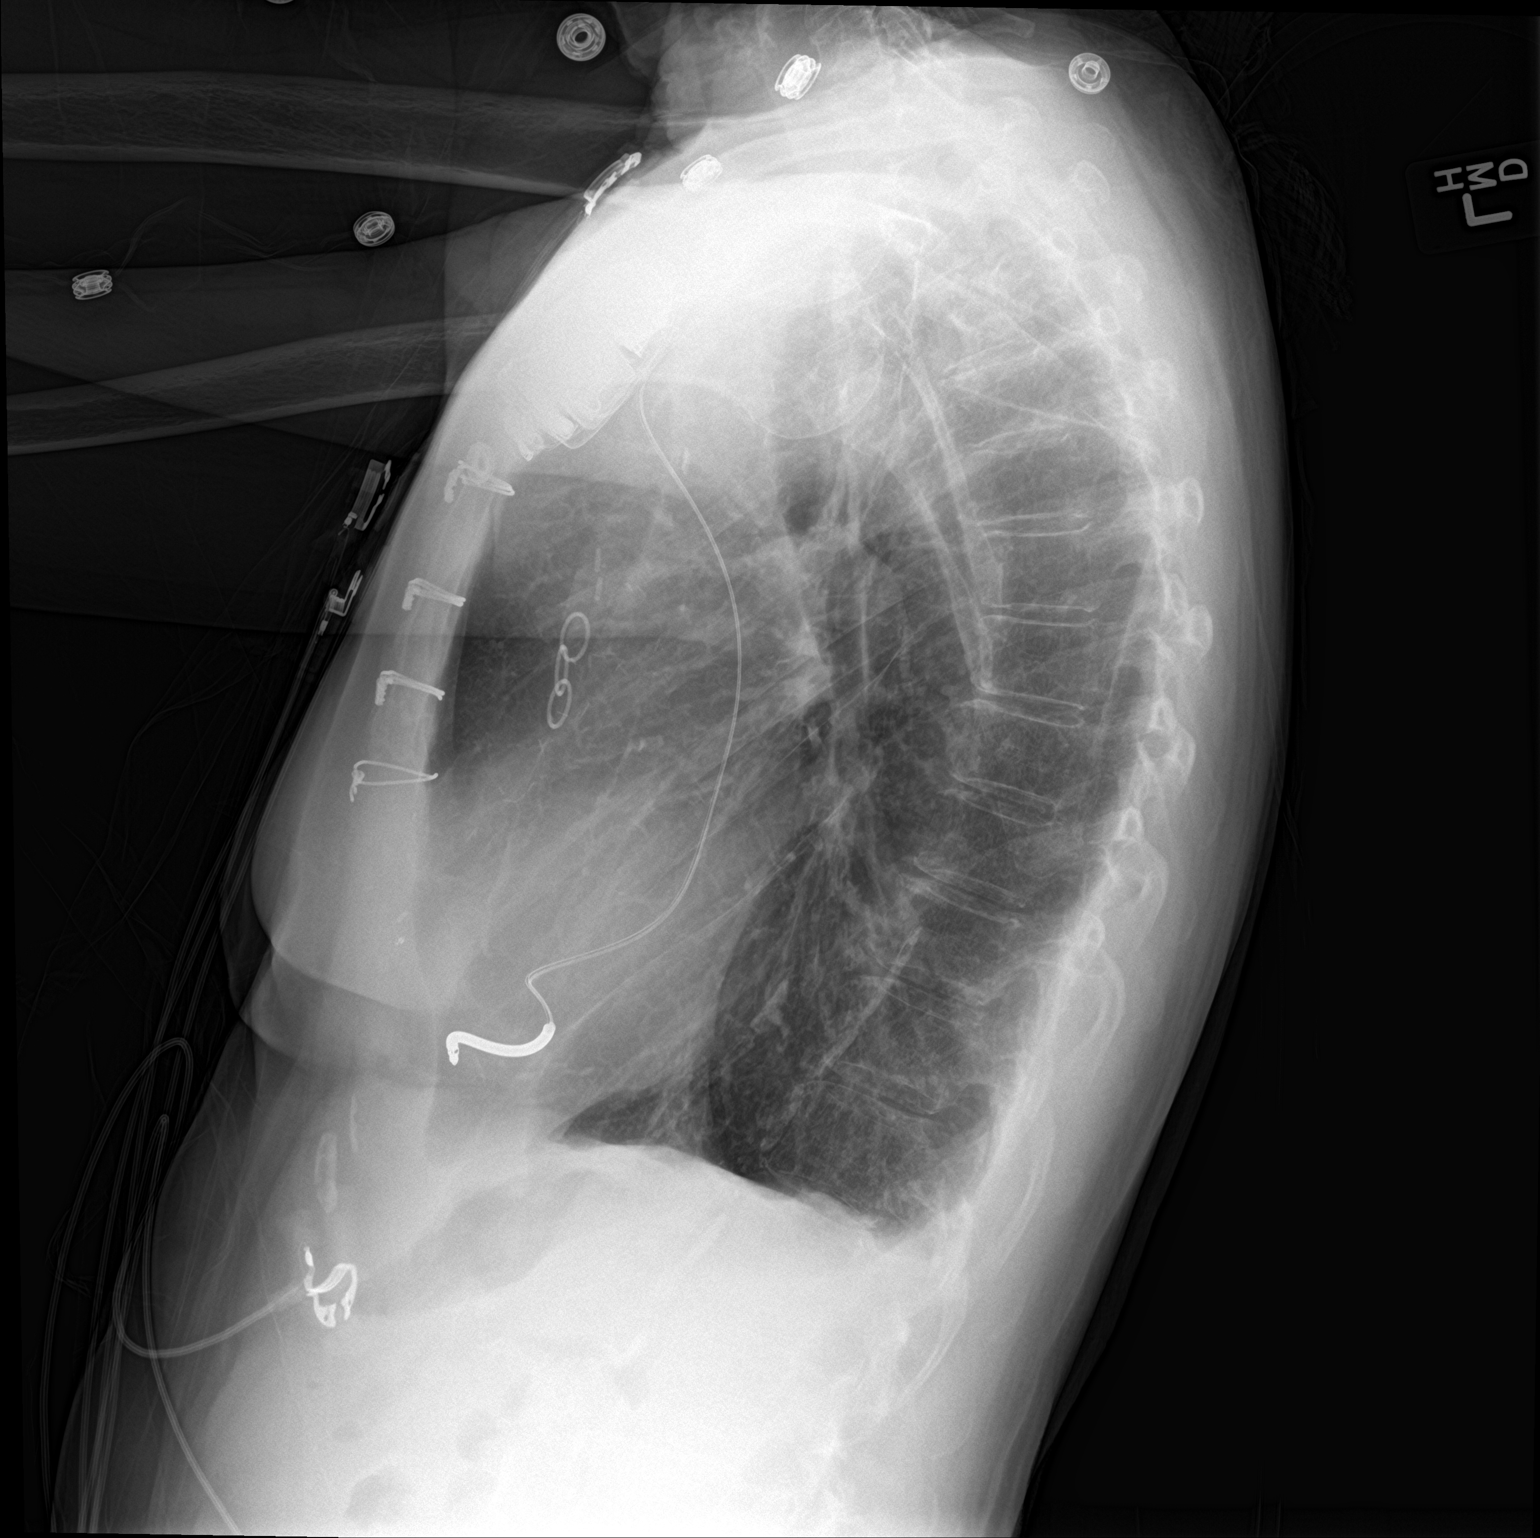

[2 of 2 positions shown; findings below may reference images not displayed]

FINDINGS: AICD noted with tip over right ventricle. No complicating features.
No pneumothorax. Prior CABG. Heart size normal. No focal infiltrate.
Small right pleural effusion versus pleural scarring.
IMPRESSION: Status post AICD placement, lead tip over the right ventricle. No
complicating features. No pneumothorax.

## 2018-12-12 ENCOUNTER — Other Ambulatory Visit: Payer: Self-pay | Admitting: Cardiology

## 2018-12-23 ENCOUNTER — Ambulatory Visit (INDEPENDENT_AMBULATORY_CARE_PROVIDER_SITE_OTHER): Payer: Medicare PPO | Admitting: *Deleted

## 2018-12-23 DIAGNOSIS — I255 Ischemic cardiomyopathy: Secondary | ICD-10-CM

## 2018-12-23 LAB — CUP PACEART REMOTE DEVICE CHECK
Battery Remaining Longevity: 123 mo
Battery Voltage: 3.01 V
Brady Statistic RV Percent Paced: 0.01 %
Date Time Interrogation Session: 20200720102206
HighPow Impedance: 77 Ohm
Implantable Lead Implant Date: 20190418
Implantable Lead Location: 753860
Implantable Pulse Generator Implant Date: 20190418
Lead Channel Impedance Value: 361 Ohm
Lead Channel Impedance Value: 418 Ohm
Lead Channel Pacing Threshold Amplitude: 0.875 V
Lead Channel Pacing Threshold Pulse Width: 0.4 ms
Lead Channel Sensing Intrinsic Amplitude: 20.875 mV
Lead Channel Sensing Intrinsic Amplitude: 20.875 mV
Lead Channel Setting Pacing Amplitude: 2.5 V
Lead Channel Setting Pacing Pulse Width: 0.4 ms
Lead Channel Setting Sensing Sensitivity: 0.3 mV

## 2018-12-26 ENCOUNTER — Ambulatory Visit (HOSPITAL_BASED_OUTPATIENT_CLINIC_OR_DEPARTMENT_OTHER)
Admission: RE | Admit: 2018-12-26 | Discharge: 2018-12-26 | Disposition: A | Discharge status: Home / Self Care | Payer: Medicare PPO | Source: Ambulatory Visit | Attending: Internal Medicine | Admitting: Internal Medicine

## 2018-12-26 ENCOUNTER — Encounter (HOSPITAL_COMMUNITY): Payer: Self-pay | Admitting: Internal Medicine

## 2018-12-26 ENCOUNTER — Other Ambulatory Visit: Payer: Self-pay

## 2018-12-26 ENCOUNTER — Ambulatory Visit (HOSPITAL_COMMUNITY)
Admission: RE | Admit: 2018-12-26 | Discharge: 2018-12-26 | Disposition: A | Discharge status: Home / Self Care | Payer: Medicare PPO | Source: Ambulatory Visit | Attending: Cardiology | Admitting: Cardiology

## 2018-12-26 VITALS — BP 109/57 | HR 68 | Wt 137.2 lb

## 2018-12-26 DIAGNOSIS — Z951 Presence of aortocoronary bypass graft: Secondary | ICD-10-CM | POA: Diagnosis not present

## 2018-12-26 DIAGNOSIS — Z87891 Personal history of nicotine dependence: Secondary | ICD-10-CM | POA: Insufficient documentation

## 2018-12-26 DIAGNOSIS — I5022 Chronic systolic (congestive) heart failure: Secondary | ICD-10-CM

## 2018-12-26 DIAGNOSIS — J90 Pleural effusion, not elsewhere classified: Secondary | ICD-10-CM | POA: Insufficient documentation

## 2018-12-26 DIAGNOSIS — I351 Nonrheumatic aortic (valve) insufficiency: Secondary | ICD-10-CM | POA: Diagnosis not present

## 2018-12-26 DIAGNOSIS — J449 Chronic obstructive pulmonary disease, unspecified: Secondary | ICD-10-CM | POA: Diagnosis not present

## 2018-12-26 DIAGNOSIS — I252 Old myocardial infarction: Secondary | ICD-10-CM | POA: Diagnosis not present

## 2018-12-26 DIAGNOSIS — I2510 Atherosclerotic heart disease of native coronary artery without angina pectoris: Secondary | ICD-10-CM | POA: Insufficient documentation

## 2018-12-26 LAB — CBC
HCT: 34.6 % — ABNORMAL LOW (ref 36.0–46.0)
Hemoglobin: 11.2 g/dL — ABNORMAL LOW (ref 12.0–15.0)
MCH: 29.9 pg (ref 26.0–34.0)
MCHC: 32.4 g/dL (ref 30.0–36.0)
MCV: 92.3 fL (ref 80.0–100.0)
Platelets: 351 10*3/uL (ref 150–400)
RBC: 3.75 MIL/uL — ABNORMAL LOW (ref 3.87–5.11)
RDW: 13.1 % (ref 11.5–15.5)
WBC: 6.4 10*3/uL (ref 4.0–10.5)
nRBC: 0 % (ref 0.0–0.2)

## 2018-12-26 LAB — BRAIN NATRIURETIC PEPTIDE: B Natriuretic Peptide: 168.7 pg/mL — ABNORMAL HIGH (ref 0.0–100.0)

## 2018-12-26 LAB — BASIC METABOLIC PANEL
Anion gap: 9 (ref 5–15)
BUN: 10 mg/dL (ref 8–23)
CO2: 24 mmol/L (ref 22–32)
Calcium: 8.5 mg/dL — ABNORMAL LOW (ref 8.9–10.3)
Chloride: 105 mmol/L (ref 98–111)
Creatinine, Ser: 0.74 mg/dL (ref 0.44–1.00)
GFR calc Af Amer: >60 mL/min (ref >60)
GFR calc non Af Amer: >60 mL/min (ref >60)
Glucose, Bld: 116 mg/dL — ABNORMAL HIGH (ref 70–99)
Potassium: 3.9 mmol/L (ref 3.5–5.1)
Sodium: 138 mmol/L (ref 135–145)

## 2018-12-26 NOTE — Progress Notes (Signed)
Advanced [facility] Note   ID:  Annalecia, Derian 1946/03/28, MRN [id number]  Location: Home  Provider location: [address], [place] Type of Visit: Established patient  PCP:  Carmin Richmond, MD  Cardiologist:  Arvilla Meres, MD Primary HF: Dr [name]  Chief Complaint: chronic systolic heart failure, CAD   History of Present Illness: LLESENIA TROTH is a 73 y.o. female with history of CAD s/ CABG x 4 12/08/2016, chronic systolic CHF (EF 92-11% 09/2017) cMRI EF 24% with only small apex scar, s/p MDT ICD, tobacco use, COPD, hx of R pleural effusion with previous pleurex catheter, and chronic anemia.   She presents for routine f/u.  Says she is exhausted all the time. Just wants to stay on the couch and sleep. Struggling with left hip pain and says she may need hip replacement. Has been seen by Sports Medicine and getting injections. Denies SOB, CP, orthopnea or PND. Occasioanl severe edema in her feet.  Compliant with meds.   Past Medical History:  Diagnosis Date  . AICD (automatic cardioverter/defibrillator) present 09/20/2017  . Alcohol abuse   . Anxiety   . CHF (congestive heart failure) (HCC)   . Coronary artery disease   . Depression   . Dyspnea    due to Pulmonary Effusion  . History of blood transfusion 2018   during CABG  . Hypertension   . Myocardial infarction [facility]) 12/2016   Past Surgical History:  Procedure Laterality Date  . CHEST TUBE INSERTION Right 03/06/2017   Procedure: INSERTION PLEURAL DRAINAGE CATHETER;  Surgeon: Kerin Perna, MD;  Location: [facility] OR;  Service: Thoracic;  Laterality: Right;  . COLONOSCOPY    . CORONARY ARTERY BYPASS GRAFT N/A 12/08/2016   Procedure: CORONARY ARTERY BYPASS GRAFTING (CABG)x4 using left internal mammary artery and bilateral greater saphenous veins harvested endoscopically;  Surgeon: Kerin Perna, MD;  Location: [facility] OR;  Service: Open Heart Surgery;  Laterality: N/A;  . dental implants    . ICD  IMPLANT  09/20/2017  . ICD IMPLANT N/A 09/20/2017   Procedure: ICD IMPLANT;  Surgeon: Regan Lemming, MD;  Location: [facility] INVASIVE CV LAB;  Service: Cardiovascular;  Laterality: N/A;  . IR THORACENTESIS ASP PLEURAL SPACE W/IMG GUIDE  01/24/2017  . LEFT HEART CATH AND CORONARY ANGIOGRAPHY N/A 12/03/2016   Procedure: Left Heart Cath and Coronary Angiography;  Surgeon: Tonny Bollman, MD;  Location: [facility] INVASIVE CV LAB;  Service: Cardiovascular;  Laterality: N/A;  . REMOVAL OF PLEURAL DRAINAGE CATHETER Right 06/12/2017   Procedure: REMOVAL OF PLEURAL DRAINAGE CATHETER;  Surgeon: Kerin Perna, MD;  Location: [facility] OR;  Service: Thoracic;  Laterality: Right;  . RIGHT HEART CATH N/A 12/05/2016   Procedure: Right Heart Cath;  Surgeon: Dolores Patty, MD;  Location: [facility] INVASIVE CV LAB;  Service: Cardiovascular;  Laterality: N/A;  . TEE WITHOUT CARDIOVERSION N/A 12/08/2016   Procedure: TRANSESOPHAGEAL ECHOCARDIOGRAM (TEE);  Surgeon: Donata Clay, Theron Arista, MD;  Location: [facility] OR;  Service: Open Heart Surgery;  Laterality: N/A;  . TUBAL LIGATION       Current Outpatient Medications  Medication Sig Dispense Refill  . alendronate (FOSAMAX) 70 MG tablet Take 70 mg by mouth once a week. Take with a full glass of water on an empty stomach.    Marland Kitchen aspirin 81 MG tablet Take 1 tablet (81 mg total) daily by mouth.    Marland Kitchen atorvastatin (LIPITOR) 80 MG tablet TAKE 1 TABLET DAILY AT 6  PM 90 tablet 0  . buPROPion (WELLBUTRIN XL) 300 MG 24 hr tablet Take 300 mg by mouth daily.    . carvedilol (COREG) 12.5 MG tablet TAKE 1 TABLET TWICE DAILY 180 tablet 3  . digoxin (LANOXIN) 0.125 MG tablet Take 1 tablet (125 mcg total) by mouth daily. 90 tablet 3  . furosemide (LASIX) 40 MG tablet Take 1 tablet (40 mg total) by mouth every Monday, Wednesday, and Friday. Can take extra 40 mg if needed for fluid 45 tablet 2  . potassium chloride SA (K-DUR,KLOR-CON) 20 MEQ tablet Take 1 tablet (20 mEq total) by mouth 3 (three) times a week. Every  Mon, Wed and Friday.  Take extra tab when you take extra Furosemide (Patient taking differently: Take 20 mEq by mouth every Monday, Wednesday, and Friday. Take extra 20 MEQ when you take extra Furosemide)    . sacubitril-valsartan (ENTRESTO) 24-26 MG Take 1 tablet by mouth 2 (two) times daily. 180 tablet 3  . sertraline (ZOLOFT) 100 MG tablet Take 200 mg by mouth daily.    Marland Kitchen spironolactone (ALDACTONE) 25 MG tablet TAKE 1 TABLET EVERY DAY 90 tablet 3  . traMADol (ULTRAM) 50 MG tablet Take 1-2 tablets (50-100 mg total) by mouth every 4 (four) hours as needed for moderate pain. 30 tablet 0  . nitroGLYCERIN (NITROSTAT) 0.4 MG SL tablet Place 1 tablet (0.4 mg total) under the tongue every 5 (five) minutes as needed for chest pain. (Patient not taking: Reported on 02/19/2018) 15 tablet 3   No current facility-administered medications for this encounter.     Allergies:   Codeine   Social History:  The patient  reports that she quit smoking about 2 years ago. Her smoking use included cigarettes. She quit after 50.00 years of use. She has never used smokeless tobacco. She reports that she does not drink alcohol or use drugs.   Family History:  [name] family history includes CAD in her sister; Heart block in her father, mother, and sister.   ROS:  Please see the history of present illness.   All other systems are personally reviewed and negative.   Vitals:   12/26/18 1130  BP: (!) 109/57  Pulse: 68  SpO2: 96%  Weight: 62.2 kg (137 lb 3.2 oz)    Exam:   General:  Thin/frail appearing. No resp difficulty HEENT: normal Neck: supple. no JVD. Carotids 2+ bilat; no bruits. No lymphadenopathy or thryomegaly appreciated. Cor: PMI nondisplaced. Regular rate & rhythm. No rubs, gallops or murmurs. Lungs: clear with decreased BS throughout no wheeze  Abdomen: soft, nontender, nondistended. No hepatosplenomegaly. No bruits or masses. Good bowel sounds. Extremities: no cyanosis, clubbing, rash, edema  Neuro: alert & orientedx3, cranial nerves grossly intact. moves all 4 extremities w/o difficulty. Affect pleasant   Recent Labs: 02/19/2018: B Natriuretic Peptide 220.1; BUN 13; Creatinine, Ser 0.76; Potassium 4.1; Sodium 138  Personally reviewed   Wt Readings from Last 3 Encounters:  12/26/18 62.2 kg (137 lb 3.2 oz)  08/27/18 63 kg (139 lb)  02/19/18 61.5 kg (135 lb 9.6 oz)     ASSESSMENT AND PLAN:  1. Chronic systolic HF due to ICM - Pre-op echo (7/18) EF 20-25% with normal RV - Pre-op cMRI (7/18) EF 24% with only small scar in apex. RHC with normal CO and no PAH - s/p CABG x 4. 12/08/16 - Echo 04/2017 EF 20-25%. Echo 09/2017 EF 20%, RV normal. S/p MDT ICD 09/2017. - Echo 09/2017 EF 25%, grade 1 DD, mild  AI, mild MR - Echo 12/26/18 EF 30-35% Mild AI. RV ok  - Chronic NYHA II-III symptoms, confounded by limited activity due to hip pain.  - Volume status ok today but has intermittent severe pedal edema. Taking lasix MWF and take extra if swells. Compression socks  - Continue dig 0.125 mg daily. Dig level stable <0.4 07/05/18.  - Continue carvedilol 12.5 mg twice a day.   - Continue Entresto 24/26 mg BID.   - Continue spiro 25 mg daily.   - BP too low to titrate meds  - Can consider SGLT2i in the future - ICD interrogated personally. No VT/VF. No shocks. No AF. Volume ok.   2. CAD - S/P CABG x4 on 12/08/16 - No s/s ischemia - Continue ASA 81 mg daily + statin.   3. Recurrent R pleural effusion - S/p Pleurex removal 06/2017. CXR 4/19 with mild costophrenic angle blunting but no significant effusion. No change.   4. PAD/claudication  - No claudication symptoms.  - ABIs 09/2017: bilateral TBI abnormal.  - Aortoiliac 10/2017: significant left common and external iliac artery disease. VVS consult recommended. She saw Dr Kirke CorinArida 11/06/17 and wanted to continue medical therapy for now.  5. Tobacco use/COPD - PFTs show severe COPD.  - No longer smoking.   6. Left Hip OA - pre-operative  eval - We discussed risks/benefits of hip replacement. Currently stable from HF/CAD standpoint but EF is low. I worry as much about her lungs as I do her heart. Overall I think she is ate moderate to high risk for cardio-pulmonary complications (including death) with hip surgery but given how limited she is, I feel the benefits outweigh the risks and now is the time to do it if she wants.  - I will refer to Dr. Magnus IvanBlackman for further evaluation   Signed, Arvilla Meresaniel Katessa Attridge, MD  12/26/2018 11:46 AM  [facility] [address] [place] [address] [phone] (office) [phone] (fax)

## 2018-12-26 NOTE — Progress Notes (Signed)
  Echocardiogram 2D Echocardiogram has been performed.  Darlina Sicilian M 12/26/2018, 11:13 AM

## 2018-12-26 NOTE — Patient Instructions (Signed)
Labs were done today. We will call you with any ABNORMAL results. No news is good news!  You have been referred to Dr Ninfa Linden, an orthopedic specialist. This office will call to schedule an appointment with you.  Your physician wants you to follow-up in: 6 months. You will receive a reminder letter in the mail two months in advance. If you don't receive a letter, please call our office to schedule the follow-up appointment.  At the [facility], you and your health needs are our priority. As part of our continuing mission to provide you with exceptional heart care, we have created designated Provider Care Teams. These Care Teams include your primary Cardiologist (physician) and Advanced Practice Providers (APPs- Physician Assistants and Nurse Practitioners) who all work together to provide you with the care you need, when you need it.   You may see any of the following providers on your designated Care Team at your next follow up: Marland Kitchen Dr Glori Bickers . Dr Loralie Champagne . Darrick Grinder, NP

## 2019-01-01 ENCOUNTER — Encounter: Payer: Self-pay | Admitting: Orthopaedic Surgery

## 2019-01-01 ENCOUNTER — Ambulatory Visit (INDEPENDENT_AMBULATORY_CARE_PROVIDER_SITE_OTHER): Payer: Medicare PPO

## 2019-01-01 ENCOUNTER — Other Ambulatory Visit (HOSPITAL_COMMUNITY): Payer: Medicare PPO

## 2019-01-01 ENCOUNTER — Encounter (HOSPITAL_COMMUNITY): Payer: Medicare PPO | Admitting: Internal Medicine

## 2019-01-01 ENCOUNTER — Ambulatory Visit (INDEPENDENT_AMBULATORY_CARE_PROVIDER_SITE_OTHER): Payer: Medicare PPO | Admitting: Orthopaedic Surgery

## 2019-01-01 DIAGNOSIS — M25552 Pain in left hip: Secondary | ICD-10-CM

## 2019-01-01 DIAGNOSIS — M1612 Unilateral primary osteoarthritis, left hip: Secondary | ICD-10-CM | POA: Diagnosis not present

## 2019-01-01 NOTE — Progress Notes (Signed)
Office Visit Note   Patient: Latoya Adams           Date of Birth: 12/08/1945           MRN: [id number] Visit Date: 01/01/2019              Requested by: Dolores PattyBensimhon, Daniel R, MD [address] [place] [id number] PCP: Carmin Richmondavis, James W, MD   Assessment & Plan: Visit Diagnoses:  1. Pain in left hip   2. Unilateral primary osteoarthritis, left hip     Plan: I did speak to her in detail about what hip replacement surgery involves.  We had a long and thorough discussion about the risk and benefits of the surgery.  We talked about her interoperative and postoperative course and what to expect.  I would like to have her surgery performed over at [facility] so Dr. Gala RomneyBensimhon can also see her if needed or even pay a social visit.  She wishes to have it done there as well.  All question concerns were answered and addressed.  We will work on getting this scheduled.  Follow-Up Instructions: Return for 2 weeks post-op.   Orders:  Orders Placed This Encounter  Procedures  . XR HIP UNILAT W OR W/O PELVIS 2-3 VIEWS LEFT   No orders of the defined types were placed in this encounter.     Procedures: No procedures performed   Clinical Data: No additional findings.   Subjective: Chief Complaint  Patient presents with  . Left Hip - Pain  Patient is a very pleasant 73 year old female referred directly from Dr. Nicholes Mangoan Bensimhon to evaluate and treat known significant arthritis of the left hip.  The patient feels that she may be a fall risk now given the left hip pain that she has.  This is been going on for considerable amount of time and she has had 2 steroid injections over time under direct fluoroscopy and her left hip joint.  She says the first 1 lasted for a long period time and was wonderful.  The second 1 only lasted maybe 2 weeks.  At this point her left hip pain is daily and it is 10 out of 10.  She reports stiffness with rotation of her left hip.  Pivoting  activities causes significant out of pain.  Getting comfortable sleep on her hip is painful as well.  At this point her left hip pain is definitely affecting her mobility, her quality of life and activities daily living to the point she does wish to consider a total hip replacement.  Dr. Gala RomneyBensimhon has sent her this way as well for us to consider the surgery and he feels comfortable from a cardiac standpoint with us proceeding with a surgical intervention.  I have reviewed her medications as well.  She is only on a baby aspirin daily in terms of blood thinner.  She has had heart bypass surgery as well.  She also has a pacemaker.  HPI  Review of Systems She currently denies any headache, chest pain, shortness of breath, fever, chills, nausea, vomiting  Objective: Vital Signs: There were no vitals taken for this visit.  Physical Exam She is alert and orient x3 and in no acute distress Ortho Exam Examination of her left hip shows significant stiffness with internal and external rotation as well as pain.  The right hip actually has stiffness with rotation but no pain.  Her leg lengths are equal. Specialty Comments:  No  specialty comments available.  Imaging: Xr Hip Unilat W Or W/o Pelvis 2-3 Views Left  Result Date: 01/01/2019 An AP pelvis and lateral of her left hip shows significant left hip osteoarthritis and degenerative joint disease.  There is joint space narrowing with para-articular osteophytes.  There is sclerotic changes and cystic changes involving the femoral head and the acetabulum.  This is slightly worsened from films compared to almost a year ago.  There is moderate arthritis in the right hip.    PMFS History: Patient Active Problem List   Diagnosis Date Noted  . Unilateral primary osteoarthritis, left hip 01/01/2019  . Ischemic cardiomyopathy 09/20/2017  . Chronic systolic CHF (congestive heart failure) ([place]) 01/08/2017  . Tobacco use 01/08/2017  . S/P CABG x 4 12/08/2016   . Centrilobular emphysema ([place]) 12/06/2016  . CAD in native artery 12/06/2016  . NSTEMI (non-ST elevated myocardial infarction) ([place]) 12/03/2016  . Hypertension, essential 12/03/2016   Past Medical History:  Diagnosis Date  . AICD (automatic cardioverter/defibrillator) present 09/20/2017  . Alcohol abuse   . Anxiety   . CHF (congestive heart failure) ([place])   . Coronary artery disease   . Depression   . Dyspnea    due to Pulmonary Effusion  . History of blood transfusion 2018   during CABG  . Hypertension   . Myocardial infarction [facility]) 12/2016    Family History  Problem Relation Age of Onset  . Heart block Mother   . Heart block Father   . CAD Sister   . Heart block Sister   . Heart attack Neg Hx     Past Surgical History:  Procedure Laterality Date  . CHEST TUBE INSERTION Right 03/06/2017   Procedure: INSERTION PLEURAL DRAINAGE CATHETER;  Surgeon: Ivin Poot, MD;  Location: [place];  Service: Thoracic;  Laterality: Right;  . COLONOSCOPY    . CORONARY ARTERY BYPASS GRAFT N/A 12/08/2016   Procedure: CORONARY ARTERY BYPASS GRAFTING (CABG)x4 using left internal mammary artery and bilateral greater saphenous veins harvested endoscopically;  Surgeon: Ivin Poot, MD;  Location: [place];  Service: Open Heart Surgery;  Laterality: N/A;  . dental implants    . ICD IMPLANT  09/20/2017  . ICD IMPLANT N/A 09/20/2017   Procedure: ICD IMPLANT;  Surgeon: Constance Haw, MD;  Location: [place] CV LAB;  Service: Cardiovascular;  Laterality: N/A;  . IR THORACENTESIS ASP PLEURAL SPACE W/IMG GUIDE  01/24/2017  . LEFT HEART CATH AND CORONARY ANGIOGRAPHY N/A 12/03/2016   Procedure: Left Heart Cath and Coronary Angiography;  Surgeon: Sherren Mocha, MD;  Location: [place] CV LAB;  Service: Cardiovascular;  Laterality: N/A;  . REMOVAL OF PLEURAL DRAINAGE CATHETER Right 06/12/2017   Procedure: REMOVAL OF PLEURAL DRAINAGE CATHETER;  Surgeon: Ivin Poot, MD;  Location: [place];   Service: Thoracic;  Laterality: Right;  . RIGHT HEART CATH N/A 12/05/2016   Procedure: Right Heart Cath;  Surgeon: Jolaine Artist, MD;  Location: [place] CV LAB;  Service: Cardiovascular;  Laterality: N/A;  . TEE WITHOUT CARDIOVERSION N/A 12/08/2016   Procedure: TRANSESOPHAGEAL ECHOCARDIOGRAM (TEE);  Surgeon: Prescott Gum, Collier Salina, MD;  Location: [place];  Service: Open Heart Surgery;  Laterality: N/A;  . TUBAL LIGATION     Social History   Occupational History  . Not on file  Tobacco Use  . Smoking status: Former Smoker    Years: 50.00    Types: Cigarettes    Quit date: 12/03/2016    Years since quitting: 2.0  .  Smokeless tobacco: Never Used  Substance and Sexual Activity  . Alcohol use: No    Comment: Recovering  Alocholic  . Drug use: No  . Sexual activity: Not on file

## 2019-01-06 ENCOUNTER — Encounter: Payer: Self-pay | Admitting: Cardiology

## 2019-01-06 NOTE — Progress Notes (Signed)
Remote ICD transmission.   

## 2019-01-20 ENCOUNTER — Other Ambulatory Visit (HOSPITAL_COMMUNITY): Payer: Self-pay | Admitting: Internal Medicine

## 2019-02-20 ENCOUNTER — Other Ambulatory Visit: Payer: Self-pay | Admitting: Physician Assistant

## 2019-02-20 ENCOUNTER — Other Ambulatory Visit: Payer: Self-pay

## 2019-02-27 NOTE — Progress Notes (Signed)
[facility] [address] [place] [address] Phone: [phone] Fax: [facility] [place], [place] [address] Phone: [phone] Fax: [phone]      Your procedure is scheduled on Tuesday, September 29th.  Report to [facility] Main Entrance "A" at 10:00 A.M., and check in at the Admitting office.   Call this number if you have problems the morning of surgery:  [phone]  Call [phone] if you have any questions prior to your surgery date Monday-Friday 8am-4pm    Remember:  Do not eat after midnight the night before your surgery  You may drink clear liquids until 9:00AM the morning of your surgery.   Clear liquids allowed are: Water, Non-Citrus Juices (without pulp), Carbonated Beverages, Clear Tea, Black Coffee Only, and Gatorade   Please complete your PRE-SURGERY ENSURE that was provided to you by 9:00AM the morning of surgery.  Please, if able, drink it in one setting. DO NOT SIP.   Take these medicines the morning of surgery with A SIP OF WATER: Nitroglycerin (Nitrostat) - if needed Atorvastation (Lipitor) Bupropion (Wellbutrin) Carvedilol (Coreg) Digoxin (Lanoxin) Sertraline (Zoloft) Tramadol (Ultram) - if needed  7 days prior to surgery STOP taking any Aspirin (unless otherwise instructed by your surgeon), Aleve, Naproxen, Ibuprofen, Motrin, Advil, Goody's, BC's, all herbal medications, fish oil, and all vitamins.    The Morning of Surgery  Do not wear jewelry, make-up or nail polish.  Do not wear lotions, powders, or perfumes/colognes, or deodorant  Do not shave 48 hours prior to surgery.   Do not bring valuables to the hospital.  [facility] is not responsible for any belongings or valuables.  If you are a smoker, DO NOT Smoke 24 hours prior to surgery IF you wear a CPAP at night  please bring your mask, tubing, and machine the morning of surgery   Remember that you must have someone to transport you home after your surgery, and remain with you for 24 hours if you are discharged the same day.   Contacts, glasses, hearing aids, dentures or bridgework may not be worn into surgery.    Leave your suitcase in the car.  After surgery it may be brought to your room.  For patients admitted to the hospital, discharge time will be determined by your treatment team.  Patients discharged the day of surgery will not be allowed to drive home.    Special instructions:   [place]- Preparing For Surgery  Before surgery, you can play an important role. Because skin is not sterile, your skin needs to be as free of germs as possible. You can reduce the number of germs on your skin by washing with CHG (chlorahexidine gluconate) Soap before surgery.  CHG is an antiseptic cleaner which kills germs and bonds with the skin to continue killing germs even after washing.    Oral Hygiene is also important to reduce your risk of infection.  Remember - BRUSH YOUR TEETH THE MORNING OF SURGERY WITH YOUR REGULAR TOOTHPASTE  Please do not use if you have an allergy to CHG or antibacterial soaps. If your skin becomes reddened/irritated stop using the CHG.  Do not shave (including legs and underarms) for at least 48 hours prior to first CHG shower. It is OK to shave your face.  Please  follow these instructions carefully.   1. Shower the NIGHT BEFORE SURGERY and the MORNING OF SURGERY with CHG Soap.   2. If you chose to wash your hair, wash your hair first as usual with your normal shampoo.  3. After you shampoo, rinse your hair and body thoroughly to remove the shampoo.  4. Use CHG as you would any other liquid soap. You can apply CHG directly to the skin and wash gently with a scrungie or a clean washcloth.   5. Apply the CHG Soap to your body ONLY FROM THE NECK DOWN.  Do not use on open  wounds or open sores. Avoid contact with your eyes, ears, mouth and genitals (private parts). Wash Face and genitals (private parts)  with your normal soap.   6. Wash thoroughly, paying special attention to the area where your surgery will be performed.  7. Thoroughly rinse your body with warm water from the neck down.  8. DO NOT shower/wash with your normal soap after using and rinsing off the CHG Soap.  9. Pat yourself dry with a CLEAN TOWEL.  10. Wear CLEAN PAJAMAS to bed the night before surgery, wear comfortable clothes the morning of surgery  11. Place CLEAN SHEETS on your bed the night of your first shower and DO NOT SLEEP WITH PETS.    Day of Surgery:  Do not apply any deodorants/lotions. Please shower the morning of surgery with the CHG soap  Please wear clean clothes to the hospital/surgery center.   Remember to brush your teeth WITH YOUR REGULAR TOOTHPASTE.   Please read over the following fact sheets that you were given.

## 2019-02-28 ENCOUNTER — Other Ambulatory Visit (HOSPITAL_COMMUNITY)
Admission: RE | Admit: 2019-02-28 | Discharge: 2019-02-28 | Disposition: A | Discharge status: Home / Self Care | Payer: Medicare PPO | Source: Ambulatory Visit | Attending: Orthopaedic Surgery | Admitting: Orthopaedic Surgery

## 2019-02-28 ENCOUNTER — Other Ambulatory Visit: Payer: Self-pay

## 2019-02-28 ENCOUNTER — Encounter (HOSPITAL_COMMUNITY): Payer: Self-pay

## 2019-02-28 ENCOUNTER — Encounter (HOSPITAL_COMMUNITY)
Admission: RE | Admit: 2019-02-28 | Discharge: 2019-02-28 | Disposition: A | Discharge status: Home / Self Care | Payer: Medicare PPO | Source: Ambulatory Visit | Attending: Orthopaedic Surgery | Admitting: Orthopaedic Surgery

## 2019-02-28 DIAGNOSIS — I493 Ventricular premature depolarization: Secondary | ICD-10-CM | POA: Diagnosis not present

## 2019-02-28 DIAGNOSIS — Z87891 Personal history of nicotine dependence: Secondary | ICD-10-CM | POA: Diagnosis not present

## 2019-02-28 DIAGNOSIS — R9431 Abnormal electrocardiogram [ECG] [EKG]: Secondary | ICD-10-CM | POA: Insufficient documentation

## 2019-02-28 DIAGNOSIS — I252 Old myocardial infarction: Secondary | ICD-10-CM | POA: Insufficient documentation

## 2019-02-28 DIAGNOSIS — I255 Ischemic cardiomyopathy: Secondary | ICD-10-CM | POA: Insufficient documentation

## 2019-02-28 DIAGNOSIS — Z79899 Other long term (current) drug therapy: Secondary | ICD-10-CM | POA: Insufficient documentation

## 2019-02-28 DIAGNOSIS — I25810 Atherosclerosis of coronary artery bypass graft(s) without angina pectoris: Secondary | ICD-10-CM | POA: Diagnosis not present

## 2019-02-28 DIAGNOSIS — F329 Major depressive disorder, single episode, unspecified: Secondary | ICD-10-CM | POA: Diagnosis not present

## 2019-02-28 DIAGNOSIS — Z7982 Long term (current) use of aspirin: Secondary | ICD-10-CM | POA: Diagnosis not present

## 2019-02-28 DIAGNOSIS — F419 Anxiety disorder, unspecified: Secondary | ICD-10-CM | POA: Diagnosis not present

## 2019-02-28 DIAGNOSIS — Z951 Presence of aortocoronary bypass graft: Secondary | ICD-10-CM | POA: Diagnosis not present

## 2019-02-28 DIAGNOSIS — J449 Chronic obstructive pulmonary disease, unspecified: Secondary | ICD-10-CM | POA: Diagnosis not present

## 2019-02-28 DIAGNOSIS — I509 Heart failure, unspecified: Secondary | ICD-10-CM | POA: Diagnosis not present

## 2019-02-28 DIAGNOSIS — Z20828 Contact with and (suspected) exposure to other viral communicable diseases: Secondary | ICD-10-CM | POA: Insufficient documentation

## 2019-02-28 DIAGNOSIS — Z01818 Encounter for other preprocedural examination: Secondary | ICD-10-CM | POA: Insufficient documentation

## 2019-02-28 DIAGNOSIS — Z95810 Presence of automatic (implantable) cardiac defibrillator: Secondary | ICD-10-CM | POA: Diagnosis not present

## 2019-02-28 DIAGNOSIS — M1612 Unilateral primary osteoarthritis, left hip: Secondary | ICD-10-CM | POA: Insufficient documentation

## 2019-02-28 DIAGNOSIS — I110 Hypertensive heart disease with heart failure: Secondary | ICD-10-CM | POA: Insufficient documentation

## 2019-02-28 HISTORY — DX: Unspecified osteoarthritis, unspecified site: M19.90

## 2019-02-28 LAB — CBC
HCT: 33.5 % — ABNORMAL LOW (ref 36.0–46.0)
Hemoglobin: 11.1 g/dL — ABNORMAL LOW (ref 12.0–15.0)
MCH: 30.7 pg (ref 26.0–34.0)
MCHC: 33.1 g/dL (ref 30.0–36.0)
MCV: 92.8 fL (ref 80.0–100.0)
Platelets: 348 10*3/uL (ref 150–400)
RBC: 3.61 MIL/uL — ABNORMAL LOW (ref 3.87–5.11)
RDW: 13.2 % (ref 11.5–15.5)
WBC: 8.2 10*3/uL (ref 4.0–10.5)
nRBC: 0 % (ref 0.0–0.2)

## 2019-02-28 LAB — SURGICAL PCR SCREEN
MRSA, PCR: NEGATIVE
Staphylococcus aureus: NEGATIVE

## 2019-02-28 LAB — COMPREHENSIVE METABOLIC PANEL
ALT: 15 U/L (ref 0–44)
AST: 18 U/L (ref 15–41)
Albumin: 3.8 g/dL (ref 3.5–5.0)
Alkaline Phosphatase: 97 U/L (ref 38–126)
Anion gap: 10 (ref 5–15)
BUN: 14 mg/dL (ref 8–23)
CO2: 25 mmol/L (ref 22–32)
Calcium: 8.8 mg/dL — ABNORMAL LOW (ref 8.9–10.3)
Chloride: 104 mmol/L (ref 98–111)
Creatinine, Ser: 0.77 mg/dL (ref 0.44–1.00)
GFR calc Af Amer: >60 mL/min (ref >60)
GFR calc non Af Amer: >60 mL/min (ref >60)
Glucose, Bld: 115 mg/dL — ABNORMAL HIGH (ref 70–99)
Potassium: 3.9 mmol/L (ref 3.5–5.1)
Sodium: 139 mmol/L (ref 135–145)
Total Bilirubin: 0.1 mg/dL — ABNORMAL LOW (ref 0.3–1.2)
Total Protein: 6.6 g/dL (ref 6.5–8.1)

## 2019-02-28 NOTE — Progress Notes (Signed)
PCP - Alean Rinne Cardiologist - Dr. Haroldine Laws  PPM/ICD - yes, ICD Device Orders - faxed 02-28-19 Rep Notified - yes, Mardene Sayer emailed (02-28-19)  Chest x-ray - n/a EKG - 02-28-19  ECHO - 12-26-18 Cardiac Cath - 2018   ERAS Protcol -yes PRE-SURGERY Ensure - yes  COVID TEST- 02-28-19   Anesthesia review: yes, ICD, heart history, EKG  Patient denies shortness of breath, fever, cough and chest pain at PAT appointment   Patient verbalized understanding of instructions that were given to them at the PAT appointment. Patient was also instructed that they will need to review over the PAT instructions again at home before surgery.

## 2019-03-01 LAB — NOVEL CORONAVIRUS, NAA (HOSP ORDER, SEND-OUT TO REF LAB; TAT 18-24 HRS): SARS-CoV-2, NAA: NOT DETECTED

## 2019-03-03 NOTE — Anesthesia Preprocedure Evaluation (Addendum)
Anesthesia Evaluation  Patient identified by MRN, date of birth, ID band Patient awake    Reviewed: Allergy & Precautions, H&P , NPO status , Patient's Chart, lab work & pertinent test results  Airway Mallampati: II   Neck ROM: full    Dental  (+) Teeth Intact, Dental Advisory Given, Implants   Pulmonary shortness of breath, COPD, former smoker,    breath sounds clear to auscultation       Cardiovascular hypertension, Pt. on medications and Pt. on home beta blockers + CAD, + Past MI, + CABG and +CHF  + Cardiac Defibrillator  Rhythm:regular Rate:Normal  EF 30%   Neuro/Psych PSYCHIATRIC DISORDERS Anxiety Depression    GI/Hepatic   Endo/Other    Renal/GU      Musculoskeletal  (+) Arthritis ,   Abdominal   Peds  Hematology   Anesthesia Other Findings   Reproductive/Obstetrics                           Anesthesia Physical Anesthesia Plan  ASA: IV  Anesthesia Plan: Spinal   Post-op Pain Management:  Regional for Post-op pain   Induction: Intravenous  PONV Risk Score and Plan: 2 and Ondansetron, Treatment may vary due to age or medical condition and Midazolam  Airway Management Planned: Simple Face Mask  Additional Equipment: Arterial line  Intra-op Plan:   Post-operative Plan:   Informed Consent: I have reviewed the patients History and Physical, chart, labs and discussed the procedure including the risks, benefits and alternatives for the proposed anesthesia with the patient or authorized representative who has indicated his/her understanding and acceptance.       Plan Discussed with: CRNA, Anesthesiologist and Surgeon  Anesthesia Plan Comments: (PAT note written 03/03/2019 by Myra Gianotti, PA-C. )       Anesthesia Quick Evaluation

## 2019-03-03 NOTE — Progress Notes (Signed)
Anesthesia Chart Review:  Case: [id number] Date/Time: 03/04/19 1145   Procedure: LEFT TOTAL HIP ARTHROPLASTY ANTERIOR APPROACH (Left Hip)   Anesthesia type: Spinal   Pre-op diagnosis: Osteoarthritis Left Hip   Location: [place] OR ROOM 05 / [place] OR   Surgeon: Mcarthur Rossetti, MD      DISCUSSION: Patient is a 73 year old Adams scheduled for the above procedure.  History includes former smoker (quit 12/03/16), severe COPD, CAD (anterior NSTEMI 12/2016; s/p LIMA-LAD, SVG-DIAG, SVG-OM, SVG-PDA 02/11/91), chronic systolic CHF, ischemic cardiomyopathy, ICD (s/p Medtronic [id number] Visia AF MRI VR ICD 09/20/17), HTN, alcohol abuse (now a recovering alcoholic), dyspnea (with right pleural effusion, s/p pleural drain 03/06/17, s/p removal 06/12/17), dental implants.    Preoperative cardiology input as outlined by Dr. Haroldine Laws at 12/26/18 visit,  "Left Hip OA - pre-operative eval - We discussed risks/benefits of hip replacement. Currently stable from HF/CAD standpoint but EF is low. I worry as much about her lungs as I do her heart. Overall I think she is ate moderate to high risk for cardio-pulmonary complications (including death) with hip surgery but given how limited she is, I feel the benefits outweigh the risks and now is the time to do it if she wants.  - I will refer to Dr. Ninfa Linden for further evaluation"  Perioperative ICD prescription form is still pending from EP cardiology.  She had normal device function per 12/23/2018 remote interrogation.   She denied shortness of breath, cough, fever, chest pain at PAT RN visit.  02/28/2019 presurgical COVID-19 test was negative.  Based on currently available information I would anticipate that she could proceed as planned if no acute changes.   VS: BP (!) 114/48   Pulse 75   Temp (!) 35.9 C (Oral)   Resp 18   Ht 5' 6.5" (1.689 m)   Wt 62.2 kg   SpO2 98%   BMI 21.81 kg/m   PROVIDERS: Coy Saunas, MD is PCP Glori Bickers, MD is HF  cardiologist Allegra Lai, MD is EP cardiologist   LABS: Labs reviewed: Acceptable for surgery. (all labs ordered are listed, but only abnormal results are displayed)  Labs Reviewed  COMPREHENSIVE METABOLIC PANEL - Abnormal; Notable for the following components:      Result Value   Glucose, Bld 115 (*)    Calcium 8.8 (*)    Total Bilirubin 0.1 (*)    All other components within normal limits  CBC - Abnormal; Notable for the following components:   RBC 3.61 (*)    Hemoglobin 11.1 (*)    HCT 33.5 (*)    All other components within normal limits  SURGICAL PCR SCREEN    Spirometry 12/05/16 (pre-CABG): FVC 1.81 (Latoya%), FEV1 0.94 (34%), FEV1/FVC 52% (68%).    EKG: 02/28/19: Sinus rhythm with occasional Premature ventricular complexes ST & T wave abnormality, consider inferior ischemia ST & T wave abnormality, consider anterolateral ischemia Abnormal ECG Since the last tracing the PVC is new Confirmed by Lauree Chandler [phone]) on 03/02/2019 11:17:43 AM   CV: Echo 12/26/18: IMPRESSIONS  1. The left ventricle has moderate-severely reduced systolic function, with an ejection fraction of 30-35%. The cavity size was normal. Left ventricular diastolic Doppler parameters are consistent with impaired relaxation.  2. The right ventricle has normal systolic function. The cavity was normal. There is no increase in right ventricular wall thickness.  3. Mild calcification of the aortic valve. Aortic valve regurgitation is mild by color flow Doppler.  4. The aorta is normal in  size and structure. (EF 20-25% 12/04/16; 15-20% 04/10/17; 25% 09/03/17)   RHC 12/05/16: Findings:  RA = 1 RV = 30/2 PA = 33/4 (21) PCW = 11 Fick cardiac output/index = 4.4/2.6 PVR = 2.6 WU Ao sat = 90% PA sat = 53%, 55%  Assessment: 1. Normal hemodynamics 2. Mild resting hypoxemia 3. Stop lasix   MRI Cardiac 12/04/16: IMPRESSION: 1) Severe LVE with diffuse hypokinesis worse in the septum and anterior  wall with a small area of apical akinesis EF 24% 2) Small area of subendocardial scar involving the apex 3) No mural apical thrombus 4) Mild LAE 5) Mild appearing MR 6) Mild appearing AR 7) Trivial posterior pericardial effusion   She had LHC on 12/03/16 prior to undergoing CABG.   Carotid US 12/07/16: Summary: No significant extracranial carotid artery stenosis demonstrated, 1-39%. are patent with antegrade flow.    Past Medical History:  Diagnosis Date  . AICD (automatic cardioverter/defibrillator) present 09/20/2017  . Alcohol abuse   . Anxiety   . Arthritis   . CHF (congestive heart failure) ([facility])   . Coronary artery disease   . Depression   . Dyspnea    due to Pulmonary Effusion  . History of blood transfusion 2018   during CABG  . Hypertension   . Myocardial infarction [facility]) 12/2016    Past Surgical History:  Procedure Laterality Date  . CHEST TUBE INSERTION Right 03/06/2017   Procedure: INSERTION PLEURAL DRAINAGE CATHETER;  Surgeon: Kerin Perna, MD;  Location: [facility] OR;  Service: Thoracic;  Laterality: Right;  . COLONOSCOPY    . CORONARY ARTERY BYPASS GRAFT N/A 12/08/2016   Procedure: CORONARY ARTERY BYPASS GRAFTING (CABG)x4 using left internal mammary artery and bilateral greater saphenous veins harvested endoscopically;  Surgeon: Kerin Perna, MD;  Location: [facility] OR;  Service: Open Heart Surgery;  Laterality: N/A;  . dental implants    . ICD IMPLANT  09/20/2017  . ICD IMPLANT N/A 09/20/2017   Procedure: ICD IMPLANT;  Surgeon: Regan Lemming, MD;  Location: [facility] INVASIVE CV LAB;  Service: Cardiovascular;  Laterality: N/A;  . IR THORACENTESIS ASP PLEURAL SPACE W/IMG GUIDE  01/24/2017  . LEFT HEART CATH AND CORONARY ANGIOGRAPHY N/A 12/03/2016   Procedure: Left Heart Cath and Coronary Angiography;  Surgeon: Tonny Bollman, MD;  Location: [facility] INVASIVE CV LAB;  Service: Cardiovascular;  Laterality: N/A;  . REMOVAL OF PLEURAL DRAINAGE CATHETER Right 06/12/2017    Procedure: REMOVAL OF PLEURAL DRAINAGE CATHETER;  Surgeon: Kerin Perna, MD;  Location: [facility] OR;  Service: Thoracic;  Laterality: Right;  . RIGHT HEART CATH N/A 12/05/2016   Procedure: Right Heart Cath;  Surgeon: Dolores Patty, MD;  Location: [facility] INVASIVE CV LAB;  Service: Cardiovascular;  Laterality: N/A;  . TEE WITHOUT CARDIOVERSION N/A 12/08/2016   Procedure: TRANSESOPHAGEAL ECHOCARDIOGRAM (TEE);  Surgeon: Donata Clay, Theron Arista, MD;  Location: [facility] OR;  Service: Open Heart Surgery;  Laterality: N/A;  . TUBAL LIGATION      MEDICATIONS: . aspirin 81 MG tablet  . atorvastatin (LIPITOR) 80 MG tablet  . buPROPion (WELLBUTRIN XL) 300 MG 24 hr tablet  . carvedilol (COREG) 12.5 MG tablet  . digoxin (LANOXIN) 0.125 MG tablet  . Ergocalciferol (VITAMIN D2) 10 MCG (400 UNIT) TABS  . furosemide (LASIX) 40 MG tablet  . nitroGLYCERIN (NITROSTAT) 0.4 MG SL tablet  . potassium chloride SA (K-DUR,KLOR-CON) 20 MEQ tablet  . sacubitril-valsartan (ENTRESTO) 24-26 MG  . sertraline (ZOLOFT) 100 MG tablet  . spironolactone (ALDACTONE) 25 MG  tablet  . traMADol (ULTRAM) 50 MG tablet   No current facility-administered medications for this encounter.     Shonna Chock, PA-C Surgical Short Stay/Anesthesiology [facility] Phone [phone] [facility] Phone [phone] 03/03/2019 1:13 PM

## 2019-03-04 ENCOUNTER — Observation Stay (HOSPITAL_COMMUNITY): Payer: Medicare PPO

## 2019-03-04 ENCOUNTER — Ambulatory Visit (HOSPITAL_COMMUNITY): Payer: Medicare PPO

## 2019-03-04 ENCOUNTER — Other Ambulatory Visit: Payer: Self-pay

## 2019-03-04 ENCOUNTER — Inpatient Hospital Stay (HOSPITAL_COMMUNITY)
Admission: AD | Admit: 2019-03-04 | Discharge: 2019-03-06 | DRG: 470 | Disposition: A | Discharge status: Home Health | Payer: Medicare PPO | Attending: Orthopaedic Surgery | Admitting: Orthopaedic Surgery

## 2019-03-04 ENCOUNTER — Encounter (HOSPITAL_COMMUNITY): Payer: Self-pay | Admitting: Anesthesiology

## 2019-03-04 ENCOUNTER — Ambulatory Visit (HOSPITAL_COMMUNITY): Payer: Medicare PPO | Admitting: Vascular Surgery

## 2019-03-04 ENCOUNTER — Encounter (HOSPITAL_COMMUNITY)
Admission: AD | Disposition: A | Discharge status: Home Health | Payer: Self-pay | Source: Home / Self Care | Attending: Orthopaedic Surgery

## 2019-03-04 ENCOUNTER — Ambulatory Visit (HOSPITAL_COMMUNITY): Payer: Medicare PPO | Admitting: Anesthesiology

## 2019-03-04 DIAGNOSIS — Z95810 Presence of automatic (implantable) cardiac defibrillator: Secondary | ICD-10-CM

## 2019-03-04 DIAGNOSIS — F419 Anxiety disorder, unspecified: Secondary | ICD-10-CM | POA: Diagnosis present

## 2019-03-04 DIAGNOSIS — Z885 Allergy status to narcotic agent status: Secondary | ICD-10-CM

## 2019-03-04 DIAGNOSIS — J432 Centrilobular emphysema: Secondary | ICD-10-CM | POA: Diagnosis present

## 2019-03-04 DIAGNOSIS — I255 Ischemic cardiomyopathy: Secondary | ICD-10-CM | POA: Diagnosis present

## 2019-03-04 DIAGNOSIS — I5022 Chronic systolic (congestive) heart failure: Secondary | ICD-10-CM | POA: Diagnosis present

## 2019-03-04 DIAGNOSIS — Z8249 Family history of ischemic heart disease and other diseases of the circulatory system: Secondary | ICD-10-CM

## 2019-03-04 DIAGNOSIS — Z96642 Presence of left artificial hip joint: Secondary | ICD-10-CM

## 2019-03-04 DIAGNOSIS — Z87891 Personal history of nicotine dependence: Secondary | ICD-10-CM

## 2019-03-04 DIAGNOSIS — I110 Hypertensive heart disease with heart failure: Secondary | ICD-10-CM | POA: Diagnosis present

## 2019-03-04 DIAGNOSIS — M1612 Unilateral primary osteoarthritis, left hip: Principal | ICD-10-CM | POA: Diagnosis present

## 2019-03-04 DIAGNOSIS — Z951 Presence of aortocoronary bypass graft: Secondary | ICD-10-CM

## 2019-03-04 DIAGNOSIS — F329 Major depressive disorder, single episode, unspecified: Secondary | ICD-10-CM | POA: Diagnosis present

## 2019-03-04 DIAGNOSIS — I252 Old myocardial infarction: Secondary | ICD-10-CM

## 2019-03-04 DIAGNOSIS — I2510 Atherosclerotic heart disease of native coronary artery without angina pectoris: Secondary | ICD-10-CM | POA: Diagnosis present

## 2019-03-04 DIAGNOSIS — D62 Acute posthemorrhagic anemia: Secondary | ICD-10-CM | POA: Diagnosis not present

## 2019-03-04 DIAGNOSIS — Z419 Encounter for procedure for purposes other than remedying health state, unspecified: Secondary | ICD-10-CM

## 2019-03-04 HISTORY — PX: TOTAL HIP ARTHROPLASTY: SHX124

## 2019-03-04 SURGERY — ARTHROPLASTY, HIP, TOTAL, ANTERIOR APPROACH
Anesthesia: Spinal | Site: Hip | Laterality: Left

## 2019-03-04 MED ORDER — CARVEDILOL 12.5 MG PO TABS
12.5000 mg | ORAL_TABLET | Freq: Two times a day (BID) | ORAL | Status: DC
  Administered 2019-03-04 – 2019-03-06 (×4): 12.5 mg via ORAL
  Filled 2019-03-04 (×3): qty 1

## 2019-03-04 MED ORDER — ONDANSETRON HCL 4 MG PO TABS: 4.0000 mg | ORAL_TABLET | Freq: Four times a day (QID) | ORAL | Status: DC | PRN

## 2019-03-04 MED ORDER — 0.9 % SODIUM CHLORIDE (POUR BTL) OPTIME
TOPICAL | Status: DC | PRN
  Administered 2019-03-04: 1000 mL

## 2019-03-04 MED ORDER — ONDANSETRON HCL 4 MG/2ML IJ SOLN: 4.0000 mg | Freq: Four times a day (QID) | INTRAMUSCULAR | Status: DC | PRN

## 2019-03-04 MED ORDER — ALBUMIN HUMAN 5 % IV SOLN
INTRAVENOUS | Status: DC | PRN
  Administered 2019-03-04: 13:00:00 via INTRAVENOUS

## 2019-03-04 MED ORDER — TRANEXAMIC ACID-NACL 1000-0.7 MG/100ML-% IV SOLN
1000.0000 mg | INTRAVENOUS | Status: AC
  Administered 2019-03-04: 1000 mg via INTRAVENOUS

## 2019-03-04 MED ORDER — FENTANYL CITRATE (PF) 100 MCG/2ML IJ SOLN
INTRAMUSCULAR | Status: DC | PRN
  Administered 2019-03-04: 50 ug via INTRAVENOUS

## 2019-03-04 MED ORDER — SODIUM CHLORIDE 0.9 % IV SOLN
INTRAVENOUS | Status: DC
  Administered 2019-03-04: 17:00:00 via INTRAVENOUS

## 2019-03-04 MED ORDER — SODIUM CHLORIDE 0.9 % IR SOLN
Status: DC | PRN
  Administered 2019-03-04: 3000 mL

## 2019-03-04 MED ORDER — METHOCARBAMOL 1000 MG/10ML IJ SOLN
500.0000 mg | Freq: Four times a day (QID) | INTRAVENOUS | Status: DC | PRN
  Filled 2019-03-04: qty 5

## 2019-03-04 MED ORDER — SPIRONOLACTONE 25 MG PO TABS
25.0000 mg | ORAL_TABLET | Freq: Every day | ORAL | Status: DC
  Administered 2019-03-04 – 2019-03-06 (×3): 25 mg via ORAL
  Filled 2019-03-04 (×3): qty 1

## 2019-03-04 MED ORDER — ASPIRIN 81 MG PO CHEW
81.0000 mg | CHEWABLE_TABLET | Freq: Two times a day (BID) | ORAL | Status: DC
  Administered 2019-03-04 – 2019-03-06 (×4): 81 mg via ORAL
  Filled 2019-03-04 (×4): qty 1

## 2019-03-04 MED ORDER — SODIUM CHLORIDE 0.9 % IV SOLN
INTRAVENOUS | Status: DC | PRN
  Administered 2019-03-04: 25 ug/min via INTRAVENOUS

## 2019-03-04 MED ORDER — METOCLOPRAMIDE HCL 5 MG PO TABS: 5.0000 mg | ORAL_TABLET | Freq: Three times a day (TID) | ORAL | Status: DC | PRN

## 2019-03-04 MED ORDER — FENTANYL CITRATE (PF) 250 MCG/5ML IJ SOLN
INTRAMUSCULAR | Status: AC
  Filled 2019-03-04: qty 5

## 2019-03-04 MED ORDER — LACTATED RINGERS IV SOLN
INTRAVENOUS | Status: DC
  Administered 2019-03-04: 11:00:00 via INTRAVENOUS

## 2019-03-04 MED ORDER — PHENOL 1.4 % MT LIQD: 1.0000 | OROMUCOSAL | Status: DC | PRN

## 2019-03-04 MED ORDER — ACETAMINOPHEN 325 MG PO TABS: 325.0000 mg | ORAL_TABLET | Freq: Four times a day (QID) | ORAL | Status: DC | PRN

## 2019-03-04 MED ORDER — MENTHOL 3 MG MT LOZG: 1.0000 | LOZENGE | OROMUCOSAL | Status: DC | PRN

## 2019-03-04 MED ORDER — METOCLOPRAMIDE HCL 5 MG/ML IJ SOLN: 5.0000 mg | Freq: Three times a day (TID) | INTRAMUSCULAR | Status: DC | PRN

## 2019-03-04 MED ORDER — TRAMADOL HCL 50 MG PO TABS
50.0000 mg | ORAL_TABLET | Freq: Four times a day (QID) | ORAL | Status: DC
  Administered 2019-03-04 (×2): 50 mg via ORAL
  Filled 2019-03-04 (×2): qty 1

## 2019-03-04 MED ORDER — PROPOFOL 500 MG/50ML IV EMUL
INTRAVENOUS | Status: DC | PRN
  Administered 2019-03-04: 75 ug/kg/min via INTRAVENOUS

## 2019-03-04 MED ORDER — HYDROCODONE-ACETAMINOPHEN 5-325 MG PO TABS: 1.0000 | ORAL_TABLET | ORAL | Status: DC | PRN

## 2019-03-04 MED ORDER — MORPHINE SULFATE (PF) 2 MG/ML IV SOLN: 0.5000 mg | INTRAVENOUS | Status: DC | PRN

## 2019-03-04 MED ORDER — METHOCARBAMOL 500 MG PO TABS
500.0000 mg | ORAL_TABLET | Freq: Four times a day (QID) | ORAL | Status: DC | PRN
  Administered 2019-03-05: 500 mg via ORAL
  Filled 2019-03-04: qty 1

## 2019-03-04 MED ORDER — CEFAZOLIN SODIUM-DEXTROSE 2-4 GM/100ML-% IV SOLN
INTRAVENOUS | Status: AC
  Filled 2019-03-04: qty 100

## 2019-03-04 MED ORDER — MIDAZOLAM HCL 5 MG/5ML IJ SOLN
INTRAMUSCULAR | Status: DC | PRN
  Administered 2019-03-04: 2 mg via INTRAVENOUS

## 2019-03-04 MED ORDER — VITAMIN D2 10 MCG (400 UNIT) PO TABS: 400.0000 [IU] | ORAL_TABLET | Freq: Two times a day (BID) | ORAL | Status: DC

## 2019-03-04 MED ORDER — FENTANYL CITRATE (PF) 100 MCG/2ML IJ SOLN: 25.0000 ug | INTRAMUSCULAR | Status: DC | PRN

## 2019-03-04 MED ORDER — OXYCODONE HCL 5 MG PO TABS: 5.0000 mg | ORAL_TABLET | Freq: Once | ORAL | Status: DC | PRN

## 2019-03-04 MED ORDER — DIPHENHYDRAMINE HCL 12.5 MG/5ML PO ELIX: 12.5000 mg | ORAL_SOLUTION | ORAL | Status: DC | PRN

## 2019-03-04 MED ORDER — POVIDONE-IODINE 10 % EX SWAB: 2.0000 "application " | Freq: Once | CUTANEOUS | Status: DC

## 2019-03-04 MED ORDER — PANTOPRAZOLE SODIUM 40 MG PO TBEC
40.0000 mg | DELAYED_RELEASE_TABLET | Freq: Every day | ORAL | Status: DC
  Administered 2019-03-05 – 2019-03-06 (×2): 40 mg via ORAL
  Filled 2019-03-04 (×2): qty 1

## 2019-03-04 MED ORDER — POTASSIUM CHLORIDE CRYS ER 20 MEQ PO TBCR
20.0000 meq | EXTENDED_RELEASE_TABLET | ORAL | Status: DC
  Administered 2019-03-05: 20 meq via ORAL
  Filled 2019-03-04 (×2): qty 1

## 2019-03-04 MED ORDER — POLYETHYLENE GLYCOL 3350 17 G PO PACK: 17.0000 g | PACK | Freq: Every day | ORAL | Status: DC | PRN

## 2019-03-04 MED ORDER — DEXAMETHASONE SODIUM PHOSPHATE 4 MG/ML IJ SOLN
INTRAMUSCULAR | Status: DC | PRN
  Administered 2019-03-04: 4 mg via INTRAVENOUS

## 2019-03-04 MED ORDER — CEFAZOLIN SODIUM-DEXTROSE 1-4 GM/50ML-% IV SOLN
1.0000 g | Freq: Four times a day (QID) | INTRAVENOUS | Status: AC
  Administered 2019-03-04 (×2): 1 g via INTRAVENOUS
  Filled 2019-03-04 (×2): qty 50

## 2019-03-04 MED ORDER — FUROSEMIDE 40 MG PO TABS
40.0000 mg | ORAL_TABLET | ORAL | Status: DC
  Administered 2019-03-05: 40 mg via ORAL
  Filled 2019-03-04 (×2): qty 1

## 2019-03-04 MED ORDER — SERTRALINE HCL 50 MG PO TABS
150.0000 mg | ORAL_TABLET | Freq: Every day | ORAL | Status: DC
  Administered 2019-03-05 – 2019-03-06 (×2): 150 mg via ORAL
  Filled 2019-03-04 (×2): qty 1

## 2019-03-04 MED ORDER — SACUBITRIL-VALSARTAN 24-26 MG PO TABS
1.0000 | ORAL_TABLET | Freq: Two times a day (BID) | ORAL | Status: DC
  Administered 2019-03-04 – 2019-03-06 (×3): 1 via ORAL
  Filled 2019-03-04 (×5): qty 1

## 2019-03-04 MED ORDER — ONDANSETRON HCL 4 MG/2ML IJ SOLN
INTRAMUSCULAR | Status: AC
  Filled 2019-03-04: qty 2

## 2019-03-04 MED ORDER — OXYCODONE HCL 5 MG/5ML PO SOLN: 5.0000 mg | Freq: Once | ORAL | Status: DC | PRN

## 2019-03-04 MED ORDER — ONDANSETRON HCL 4 MG/2ML IJ SOLN
INTRAMUSCULAR | Status: DC | PRN
  Administered 2019-03-04: 4 mg via INTRAVENOUS

## 2019-03-04 MED ORDER — DIGOXIN 125 MCG PO TABS
125.0000 ug | ORAL_TABLET | Freq: Every day | ORAL | Status: DC
  Administered 2019-03-05 – 2019-03-06 (×2): 125 ug via ORAL
  Filled 2019-03-04 (×2): qty 1

## 2019-03-04 MED ORDER — CHLORHEXIDINE GLUCONATE 4 % EX LIQD: 60.0000 mL | Freq: Once | CUTANEOUS | Status: DC

## 2019-03-04 MED ORDER — CEFAZOLIN SODIUM-DEXTROSE 2-4 GM/100ML-% IV SOLN
2.0000 g | INTRAVENOUS | Status: AC
  Administered 2019-03-04: 2 g via INTRAVENOUS

## 2019-03-04 MED ORDER — HYDROCODONE-ACETAMINOPHEN 7.5-325 MG PO TABS
1.0000 | ORAL_TABLET | ORAL | Status: DC | PRN
  Administered 2019-03-04 – 2019-03-05 (×4): 1 via ORAL
  Administered 2019-03-05 – 2019-03-06 (×2): 2 via ORAL
  Filled 2019-03-04 (×2): qty 1
  Filled 2019-03-04: qty 2
  Filled 2019-03-04 (×2): qty 1
  Filled 2019-03-04: qty 2

## 2019-03-04 MED ORDER — MIDAZOLAM HCL 2 MG/2ML IJ SOLN
INTRAMUSCULAR | Status: AC
  Filled 2019-03-04: qty 2

## 2019-03-04 MED ORDER — LIDOCAINE 2% (20 MG/ML) 5 ML SYRINGE
INTRAMUSCULAR | Status: AC
  Filled 2019-03-04: qty 5

## 2019-03-04 MED ORDER — ALUM & MAG HYDROXIDE-SIMETH 200-200-20 MG/5ML PO SUSP: 30.0000 mL | ORAL | Status: DC | PRN

## 2019-03-04 MED ORDER — ATORVASTATIN CALCIUM 80 MG PO TABS
80.0000 mg | ORAL_TABLET | Freq: Every day | ORAL | Status: DC
  Administered 2019-03-05: 80 mg via ORAL

## 2019-03-04 MED ORDER — DEXAMETHASONE SODIUM PHOSPHATE 10 MG/ML IJ SOLN
INTRAMUSCULAR | Status: AC
  Filled 2019-03-04: qty 1

## 2019-03-04 MED ORDER — DOCUSATE SODIUM 100 MG PO CAPS
100.0000 mg | ORAL_CAPSULE | Freq: Two times a day (BID) | ORAL | Status: DC
  Administered 2019-03-04 – 2019-03-06 (×4): 100 mg via ORAL
  Filled 2019-03-04 (×4): qty 1

## 2019-03-04 MED ORDER — PROPOFOL 10 MG/ML IV BOLUS
INTRAVENOUS | Status: DC | PRN
  Administered 2019-03-04 (×2): 20 mg via INTRAVENOUS

## 2019-03-04 MED ORDER — BUPROPION HCL ER (XL) 150 MG PO TB24
300.0000 mg | ORAL_TABLET | Freq: Every day | ORAL | Status: DC
  Administered 2019-03-05 – 2019-03-06 (×2): 300 mg via ORAL
  Filled 2019-03-04 (×2): qty 2

## 2019-03-04 MED ORDER — TRANEXAMIC ACID-NACL 1000-0.7 MG/100ML-% IV SOLN
INTRAVENOUS | Status: AC
  Filled 2019-03-04: qty 100

## 2019-03-04 SURGICAL SUPPLY — 52 items
BENZOIN TINCTURE PRP APPL 2/3 (GAUZE/BANDAGES/DRESSINGS) ×2 IMPLANT
BLADE CLIPPER SURG (BLADE) IMPLANT
BLADE SAW SGTL 18X1.27X75 (BLADE) ×2 IMPLANT
COVER SURGICAL LIGHT HANDLE (MISCELLANEOUS) ×2 IMPLANT
COVER WAND RF STERILE (DRAPES) ×2 IMPLANT
CUP SECTOR GRIPTON 50MM (Cup) ×2 IMPLANT
DRAPE C-ARM 42X72 X-RAY (DRAPES) ×2 IMPLANT
DRAPE STERI IOBAN 125X83 (DRAPES) ×2 IMPLANT
DRAPE U-SHAPE 47X51 STRL (DRAPES) ×6 IMPLANT
DRSG AQUACEL AG ADV 3.5X10 (GAUZE/BANDAGES/DRESSINGS) ×2 IMPLANT
DURAPREP 26ML APPLICATOR (WOUND CARE) ×2 IMPLANT
ELECT BLADE 4.0 EZ CLEAN MEGAD (MISCELLANEOUS) ×2
ELECT BLADE 6.5 EXT (BLADE) IMPLANT
ELECT REM PT RETURN 9FT ADLT (ELECTROSURGICAL) ×2
ELECTRODE BLDE 4.0 EZ CLN MEGD (MISCELLANEOUS) ×1 IMPLANT
ELECTRODE REM PT RTRN 9FT ADLT (ELECTROSURGICAL) ×1 IMPLANT
FACESHIELD WRAPAROUND (MASK) ×4 IMPLANT
GLOVE BIOGEL PI IND STRL 8 (GLOVE) ×2 IMPLANT
GLOVE BIOGEL PI INDICATOR 8 (GLOVE) ×2
GLOVE ECLIPSE 8.0 STRL XLNG CF (GLOVE) ×2 IMPLANT
GLOVE ORTHO TXT STRL SZ7.5 (GLOVE) ×4 IMPLANT
GOWN STRL REUS W/ TWL LRG LVL3 (GOWN DISPOSABLE) ×2 IMPLANT
GOWN STRL REUS W/ TWL XL LVL3 (GOWN DISPOSABLE) ×2 IMPLANT
GOWN STRL REUS W/TWL LRG LVL3 (GOWN DISPOSABLE) ×2
GOWN STRL REUS W/TWL XL LVL3 (GOWN DISPOSABLE) ×2
HANDPIECE INTERPULSE COAX TIP (DISPOSABLE) ×1
HEAD FEM STD 32X+1 STRL (Hips) ×2 IMPLANT
KIT BASIN OR (CUSTOM PROCEDURE TRAY) ×2 IMPLANT
KIT TURNOVER KIT B (KITS) ×2 IMPLANT
LINER ACETABULAR 32X50 (Liner) ×2 IMPLANT
MANIFOLD NEPTUNE II (INSTRUMENTS) ×2 IMPLANT
NS IRRIG 1000ML POUR BTL (IV SOLUTION) ×2 IMPLANT
PACK TOTAL JOINT (CUSTOM PROCEDURE TRAY) ×2 IMPLANT
PAD ARMBOARD 7.5X6 YLW CONV (MISCELLANEOUS) ×2 IMPLANT
SET HNDPC FAN SPRY TIP SCT (DISPOSABLE) ×1 IMPLANT
STAPLER VISISTAT 35W (STAPLE) IMPLANT
STEM CORAIL KA12 (Stem) ×2 IMPLANT
STRIP CLOSURE SKIN 1/2X4 (GAUZE/BANDAGES/DRESSINGS) ×4 IMPLANT
SUT ETHIBOND NAB CT1 #1 30IN (SUTURE) ×2 IMPLANT
SUT MNCRL AB 4-0 PS2 18 (SUTURE) IMPLANT
SUT VIC AB 0 CT1 27 (SUTURE) ×1
SUT VIC AB 0 CT1 27XBRD ANBCTR (SUTURE) ×1 IMPLANT
SUT VIC AB 1 CT1 27 (SUTURE) ×1
SUT VIC AB 1 CT1 27XBRD ANBCTR (SUTURE) ×1 IMPLANT
SUT VIC AB 2-0 CT1 27 (SUTURE) ×1
SUT VIC AB 2-0 CT1 TAPERPNT 27 (SUTURE) ×1 IMPLANT
TOWEL GREEN STERILE (TOWEL DISPOSABLE) ×2 IMPLANT
TOWEL GREEN STERILE FF (TOWEL DISPOSABLE) ×2 IMPLANT
TRAY CATH 16FR W/PLASTIC CATH (SET/KITS/TRAYS/PACK) IMPLANT
TRAY FOLEY W/BAG SLVR 16FR (SET/KITS/TRAYS/PACK)
TRAY FOLEY W/BAG SLVR 16FR ST (SET/KITS/TRAYS/PACK) IMPLANT
WATER STERILE IRR 1000ML POUR (IV SOLUTION) ×4 IMPLANT

## 2019-03-04 NOTE — Anesthesia Procedure Notes (Signed)
Arterial Line Insertion Start/End9/29/2020 11:20 AM, 03/04/2019 11:40 AM Performed by: Lance Coon, CRNA  Preanesthetic checklist: patient identified, IV checked, site marked, risks and benefits discussed, surgical consent, monitors and equipment checked, pre-op evaluation, timeout performed and anesthesia consent Lidocaine 1% used for infiltration Right, ulnar was placed Catheter size: 20 G Hand hygiene performed , maximum sterile barriers used  and Seldinger technique used  Attempts: 3 Procedure performed without using ultrasound guided technique. Following insertion, dressing applied and Biopatch. Post procedure assessment: normal and unchanged  Patient tolerated the procedure well with no immediate complications.

## 2019-03-04 NOTE — H&P (Signed)
TOTAL HIP ADMISSION H&P  Patient is admitted for left total hip arthroplasty.  Subjective:  Chief Complaint: left hip pain  HPI: Latoya Adams, 73 y.o. female, has a history of pain and functional disability in the left hip(s) due to arthritis and patient has failed non-surgical conservative treatments for greater than 12 weeks to include NSAID's and/or analgesics, corticosteriod injections, use of assistive devices and activity modification.  Onset of symptoms was gradual starting 2 years ago with gradually worsening course since that time.The patient noted no past surgery on the left hip(s).  Patient currently rates pain in the left hip at 10 out of 10 with activity. Patient has night pain, worsening of pain with activity and weight bearing, pain that interfers with activities of daily living and pain with passive range of motion. Patient has evidence of subchondral cysts, subchondral sclerosis, periarticular osteophytes and joint space narrowing by imaging studies. This condition presents safety issues increasing the risk of falls.  There is no current active infection.  Patient Active Problem List   Diagnosis Date Noted  . Unilateral primary osteoarthritis, left hip 01/01/2019  . Ischemic cardiomyopathy 09/20/2017  . Chronic systolic CHF (congestive heart failure) (HCC) 01/08/2017  . Tobacco use 01/08/2017  . S/P CABG x 4 12/08/2016  . Centrilobular emphysema (HCC) 12/06/2016  . CAD in native artery 12/06/2016  . NSTEMI (non-ST elevated myocardial infarction) (HCC) 12/03/2016  . Hypertension, essential 12/03/2016   Past Medical History:  Diagnosis Date  . AICD (automatic cardioverter/defibrillator) present 09/20/2017  . Alcohol abuse   . Anxiety   . Arthritis   . CHF (congestive heart failure) (HCC)   . Coronary artery disease   . Depression   . Dyspnea    due to Pulmonary Effusion  . History of blood transfusion 2018   during CABG  . Hypertension   . Myocardial infarction  [facility]) 12/2016    Past Surgical History:  Procedure Laterality Date  . CHEST TUBE INSERTION Right 03/06/2017   Procedure: INSERTION PLEURAL DRAINAGE CATHETER;  Surgeon: Kerin Perna, MD;  Location: [facility] OR;  Service: Thoracic;  Laterality: Right;  . COLONOSCOPY    . CORONARY ARTERY BYPASS GRAFT N/A 12/08/2016   Procedure: CORONARY ARTERY BYPASS GRAFTING (CABG)x4 using left internal mammary artery and bilateral greater saphenous veins harvested endoscopically;  Surgeon: Kerin Perna, MD;  Location: [facility] OR;  Service: Open Heart Surgery;  Laterality: N/A;  . dental implants    . ICD IMPLANT  09/20/2017  . ICD IMPLANT N/A 09/20/2017   Procedure: ICD IMPLANT;  Surgeon: Regan Lemming, MD;  Location: [facility] INVASIVE CV LAB;  Service: Cardiovascular;  Laterality: N/A;  . IR THORACENTESIS ASP PLEURAL SPACE W/IMG GUIDE  01/24/2017  . LEFT HEART CATH AND CORONARY ANGIOGRAPHY N/A 12/03/2016   Procedure: Left Heart Cath and Coronary Angiography;  Surgeon: Tonny Bollman, MD;  Location: [facility] INVASIVE CV LAB;  Service: Cardiovascular;  Laterality: N/A;  . REMOVAL OF PLEURAL DRAINAGE CATHETER Right 06/12/2017   Procedure: REMOVAL OF PLEURAL DRAINAGE CATHETER;  Surgeon: Kerin Perna, MD;  Location: [facility] OR;  Service: Thoracic;  Laterality: Right;  . RIGHT HEART CATH N/A 12/05/2016   Procedure: Right Heart Cath;  Surgeon: Dolores Patty, MD;  Location: [facility] INVASIVE CV LAB;  Service: Cardiovascular;  Laterality: N/A;  . TEE WITHOUT CARDIOVERSION N/A 12/08/2016   Procedure: TRANSESOPHAGEAL ECHOCARDIOGRAM (TEE);  Surgeon: Donata Clay, Theron Arista, MD;  Location: [facility] OR;  Service: Open Heart Surgery;  Laterality: N/A;  . TUBAL  LIGATION      Current Facility-Administered Medications  Medication Dose Route Frequency Provider Last Rate Last Dose  . ceFAZolin (ANCEF) 2-4 GM/100ML-% IVPB           . ceFAZolin (ANCEF) IVPB 2g/100 mL premix  2 g Intravenous On Call to OR Pete Pelt, PA-C      . chlorhexidine (HIBICLENS)  4 % liquid 4 application  60 mL Topical Once Pete Pelt, PA-C      . lactated ringers infusion   Intravenous Continuous Catalina Gravel, MD 10 mL/hr at 03/04/19 1050    . povidone-iodine 10 % swab 2 application  2 application Topical Once Pete Pelt, PA-C      . tranexamic acid (CYKLOKAPRON) IVPB 1,000 mg  1,000 mg Intravenous To OR Pete Pelt, PA-C       Allergies  Allergen Reactions  . Codeine Hives    Social History   Tobacco Use  . Smoking status: Former Smoker    Years: 50.00    Types: Cigarettes    Quit date: 12/03/2016    Years since quitting: 2.2  . Smokeless tobacco: Never Used  Substance Use Topics  . Alcohol use: No    Comment: Recovering  Alocholic    Family History  Problem Relation Age of Onset  . Heart block Mother   . Heart block Father   . CAD Sister   . Heart block Sister   . Heart attack Neg Hx      Review of Systems  Musculoskeletal: Positive for joint pain.  All other systems reviewed and are negative.   Objective:  Physical Exam  Constitutional: She is oriented to person, place, and time. She appears well-developed and well-nourished.  HENT:  Head: Normocephalic and atraumatic.  Eyes: Pupils are equal, round, and reactive to light.  Neck: Normal range of motion.  Cardiovascular: Normal rate.  Respiratory: Effort normal.  GI: Soft.  Musculoskeletal:     Left hip: She exhibits decreased range of motion, decreased strength, tenderness and bony tenderness.  Neurological: She is alert and oriented to person, place, and time.  Skin: Skin is warm and dry.  Psychiatric: She has a normal mood and affect.    Vital signs in last 24 hours: Temp:  [98 F (36.7 C)] 98 F (36.7 C) (09/29 1035) Pulse Rate:  [69] 69 (09/29 1035) Resp:  [18] 18 (09/29 1035) BP: (113)/(45) 113/45 (09/29 1035) SpO2:  [98 %] 98 % (09/29 1035) Weight:  [61.2 kg] 61.2 kg (09/29 1035)  Labs:   Estimated body mass index is 21.79 kg/m as calculated  from the following:   Height as of this encounter: 5\' 6"  (1.676 m).   Weight as of this encounter: 61.2 kg.   Imaging Review Plain radiographs demonstrate severe degenerative joint disease of the left hip(s). The bone quality appears to be good for age and reported activity level.      Assessment/Plan:  End stage arthritis, left hip(s)  The patient history, physical examination, clinical judgement of the provider and imaging studies are consistent with end stage degenerative joint disease of the left hip(s) and total hip arthroplasty is deemed medically necessary. The treatment options including medical management, injection therapy, arthroscopy and arthroplasty were discussed at length. The risks and benefits of total hip arthroplasty were presented and reviewed. The risks due to aseptic loosening, infection, stiffness, dislocation/subluxation,  thromboembolic complications and other imponderables were discussed.  The patient acknowledged the explanation, agreed to proceed with  the plan and consent was signed. Patient is being admitted for inpatient treatment for surgery, pain control, PT, OT, prophylactic antibiotics, VTE prophylaxis, progressive ambulation and ADL's and discharge planning.The patient is planning to be discharged home with home health services

## 2019-03-04 NOTE — Transfer of Care (Signed)
Immediate Anesthesia Transfer of Care Note  Patient: Latoya Adams  Procedure(s) Performed: LEFT TOTAL HIP ARTHROPLASTY ANTERIOR APPROACH (Left Hip)  Patient Location: PACU  Anesthesia Type:MAC and Spinal  Level of Consciousness: awake, alert , oriented and patient cooperative  Airway & Oxygen Therapy: Patient Spontanous Breathing and Patient connected to face mask oxygen  Post-op Assessment: Report given to RN and Post -op Vital signs reviewed and stable  Post vital signs: Reviewed  Last Vitals:  Vitals Value Taken Time  BP 100/51 03/04/19 1338  Temp    Pulse 67 03/04/19 1340  Resp 11 03/04/19 1340  SpO2 98 % 03/04/19 1340  Vitals shown include unvalidated device data.  Last Pain:  Vitals:   03/04/19 1043  TempSrc:   PainSc: 0-No pain         Complications: No apparent anesthesia complications

## 2019-03-04 NOTE — Op Note (Signed)
NAME: Latoya, Adams MEDICAL RECORD [id number] ACCOUNT [id number] DATE OF BIRTH:08/17/1945 FACILITY: [facility] LOCATION: [facility] PHYSICIAN:Hailie Searight Kerry Fort, MD  OPERATIVE REPORT  DATE OF PROCEDURE:  03/04/2019  PREOPERATIVE DIAGNOSES:  Severe arthritis and degenerative joint disease, left hip.  POSTOPERATIVE DIAGNOSES:  Severe arthritis and degenerative joint disease, left hip.  PROCEDURE:  Left total hip arthroplasty through direct anterior approach.  IMPLANTS:  DePuy Sector Gription acetabular component size 50, size 32+0 neutral polyethylene liner, size 12 Corail femoral component with standard offset, size 32+1 metal hip ball.  SURGEON:  Lind Guest. Ninfa Linden, MD  ASSISTANT:  Erskine Emery, PA-C  ANESTHESIA:  Spinal.  ANTIBIOTICS:  Two grams IV Ancef.  ESTIMATED BLOOD LOSS:  300 mL.  COMPLICATIONS:  None.  INDICATIONS:  The patient is a 73 year old female with debilitating arthritis involving her left hip.  She also has a complex cardiac history and had to be cleared for hip replacement surgery from a cardiologist.  Her left hip pain is daily, and she does  have severe arthritis in the left hip radiographically.  She has tried and failed conservative treatment.  At this point, her left hip pain has detrimentally affected her mobility, her quality of life, and activities of daily living, to the point she  does wish to proceed with a total hip arthroplasty.  We had a long and thorough discussion about surgery.  We talked about the risk of acute blood loss anemia, nerve and vessel injury, fracture, infection, dislocation, DVT, as well as implant failure.   We talked about the goals being decreased pain, improved mobility, and overall improved quality of life.  DESCRIPTION OF PROCEDURE:  After informed consent was obtained and appropriate left hip was marked, she was brought to the operating room and sat up on a stretcher where spinal anesthesia was then obtained.  She  was then laid in supine position on a  stretcher.  I was able to assess her leg lengths and found them to be equal.  We placed traction boots on both her feet and then placed a Foley catheter as well.  Next, she was placed supine on the Hana operative fracture table with a perineal post in  place and both legs in in-line skeletal traction device with no traction applied.  Her left operative hip was prepped and draped with DuraPrep and sterile drapes.  A time-out was called, and she was identified as correct patient and correct left hip.  We  then made an incision just inferior and posterior to the anterior superior iliac spine and carried this obliquely down the leg.  We dissected down the tensor fascia lata muscle.  The tensor fascia was then divided longitudinally to proceed with direct  anterior approach to the hip.  We identified and cauterized circumflex vessels.  I then identified the hip capsule, opened up the hip capsule in an L-type format, finding a moderate joint effusion and significant periarticular osteophytes around the  femoral head and neck.  We placed curved retractors within the hip capsule around the medial and lateral femoral neck and then made our femoral neck cut with an oscillating saw just proximal to the lesser trochanter and completed this with an osteotome.   We placed a corkscrew guide in the femoral head and removed the femoral head in its entirety and found a wide area devoid of cartilage.  I then placed a bent Hohmann over the medial acetabular rim and removed remnants of the acetabular labrum and other  debris.  I  then began reaming sequentially from a size 44 reamer in stepwise increments up to a size 49 with all reamers under direct visualization, the last reamer under direct fluoroscopy, so we could obtain our depth in reaming, our inclination and  anteversion.  I then placed the real DePuy Sector Gription acetabular component size 50 and a 32+0 neutral polyethylene liner  for that size acetabular component.  Attention was then turned to the femur.  With the leg externally rotated to 120 degrees,  extended and adducted, we were able to place a Mueller retractor medially and a Hohmann retractor above the greater trochanter.  We released the lateral joint capsule and used a box-cutting osteotome to enter the femoral canal and a rongeur to  lateralize.  We then began broaching using the Corail broaching system from a size 8 going up to a size 12.  With the size 12 in place, we trialed a standard offset femoral neck and a 32+1 trial hip ball.  We brought the leg back over and up, and with  traction and internal rotation, reduced it in the pelvis, and we were pleased with the range of motion, offset, and stability, as well as leg length.  We then irrigated the soft tissue with normal saline solution and removed all trial instrumentation.   We placed the real Corail femoral component with standard offset size 12 and the real 32+1 metal hip ball, reduced this in the acetabulum, and again it was deemed to be stable radiographically and clinically.  We then irrigated the soft tissue with  normal saline solution and then closed the joint capsule with interrupted #1 Ethibond suture.  We closed the tensor fascia with #1 Vicryl, followed by 0 Vicryl in the deep tissue, 2-0 Vicryl to close the subcutaneous tissue, and interrupted staples on  the skin incisions.  Xeroform and an Aquacel dressing were applied.  She was taken off the Hana table and taken to recovery room in stable condition.  All final counts were correct.  There were no complications noted.  Note Rexene Edison, PA-C, assisted  during the entire case, and his assistance was crucial for facilitating all aspects of this case.  LN/NUANCE  D:03/04/2019 T:03/04/2019 JOB:[id number]

## 2019-03-04 NOTE — Evaluation (Signed)
Physical Therapy Evaluation Patient Details Name: Latoya Adams MRN: [id number] DOB: [birth year] Today's Date: 03/04/2019   History of Present Illness  Pt is a 73 y/o female s/p L THA, direct anterior. PMH includes CAD s/p CABG, CHF, HTN, and MI.   Clinical Impression  Pt is s/p surgery above with deficits below. Pt limited this session as nerve block still present. Was able to take side steps at EOB with min to mod A and use of RW. Pt reports daughter will be coming in to stay with her. Will continue to follow acutely to maximize functional mobility independence and safety.     Follow Up Recommendations Follow surgeon's recommendation for DC plan and follow-up therapies    Equipment Recommendations  Rolling walker with 5" wheels    Recommendations for Other Services       Precautions / Restrictions Precautions Precautions: None Restrictions Weight Bearing Restrictions: Yes LLE Weight Bearing: Weight bearing as tolerated      Mobility  Bed Mobility Overal bed mobility: Needs Assistance Bed Mobility: Supine to Sit;Sit to Supine     Supine to sit: Min assist Sit to supine: Min assist   General bed mobility comments: Min A for LLE assist during bed mobility.   Transfers Overall transfer level: Needs assistance Equipment used: Rolling walker (2 wheeled) Transfers: Sit to/from Stand Sit to Stand: Mod assist;From elevated surface         General transfer comment: Mod A for lift assist from elevated surface. Cues for safe hand placement.   Ambulation/Gait Ambulation/Gait assistance: Min assist   Assistive device: Rolling walker (2 wheeled)   Gait velocity: Decreased   General Gait Details: Performed side stepping at EOB. Pt with decreased coordination and balance secondary to nerve block, so mobility limited to EOB.   Stairs            Wheelchair Mobility    Modified Rankin (Stroke Patients Only)       Balance Overall balance assessment: Needs  assistance Sitting-balance support: No upper extremity supported;Feet supported Sitting balance-Leahy Scale: Fair     Standing balance support: Bilateral upper extremity supported;During functional activity Standing balance-Leahy Scale: Poor Standing balance comment: Reliant on BUE support                              Pertinent Vitals/Pain Pain Assessment: Faces Faces Pain Scale: Hurts little more Pain Location: L hip Pain Descriptors / Indicators: Aching;Operative site guarding Pain Intervention(s): Limited activity within patient's tolerance;Monitored during session;Repositioned    Home Living Family/patient expects to be discharged to:: Private residence Living Arrangements: Alone Available Help at Discharge: Family;Available 24 hours/day Type of Home: House Home Access: Stairs to enter Entrance Stairs-Rails: Right;Left;Can reach both Entrance Stairs-Number of Steps: 3 Home Layout: One level Home Equipment: Walker - 4 wheels;Bedside commode;Shower seat      Prior Function Level of Independence: Independent               Hand Dominance        Extremity/Trunk Assessment   Upper Extremity Assessment Upper Extremity Assessment: Overall WFL for tasks assessed    Lower Extremity Assessment Lower Extremity Assessment: LLE deficits/detail LLE Deficits / Details: Reports numbness in feet up to bottom.     Cervical / Trunk Assessment Cervical / Trunk Assessment: Normal  Communication   Communication: No difficulties  Cognition Arousal/Alertness: Awake/alert Behavior During Therapy: WFL for tasks assessed/performed Overall Cognitive Status: Within Functional Limits  for tasks assessed                                        General Comments General comments (skin integrity, edema, etc.): Pt's son present during session.     Exercises Total Joint Exercises Ankle Circles/Pumps: AROM;Both;10 reps;Supine   Assessment/Plan    PT  Assessment Patient needs continued PT services  PT Problem List Decreased strength;Decreased balance;Decreased mobility;Decreased knowledge of use of DME;Decreased knowledge of precautions;Impaired sensation;Pain;Decreased coordination       PT Treatment Interventions Gait training;DME instruction;Stair training;Functional mobility training;Balance training;Therapeutic exercise;Therapeutic activities;Patient/family education    PT Goals (Current goals can be found in the Care Plan section)  Acute Rehab PT Goals Patient Stated Goal: to goh ome PT Goal Formulation: With patient Time For Goal Achievement: 03/18/19 Potential to Achieve Goals: Good    Frequency 7X/week   Barriers to discharge        Co-evaluation               AM-PAC PT "6 Clicks" Mobility  Outcome Measure Help needed turning from your back to your side while in a flat bed without using bedrails?: A Little Help needed moving from lying on your back to sitting on the side of a flat bed without using bedrails?: A Little Help needed moving to and from a bed to a chair (including a wheelchair)?: A Little Help needed standing up from a chair using your arms (e.g., wheelchair or bedside chair)?: A Lot Help needed to walk in hospital room?: A Lot Help needed climbing 3-5 steps with a railing? : A Lot 6 Click Score: 15    End of Session Equipment Utilized During Treatment: Gait belt Activity Tolerance: Patient tolerated treatment well Patient left: in bed;with call bell/phone within reach;with family/visitor present;with nursing/sitter in room Nurse Communication: Mobility status PT Visit Diagnosis: Unsteadiness on feet (R26.81);Muscle weakness (generalized) (M62.81);Pain Pain - Right/Left: Left Pain - part of body: Hip    Time: 5638-7564 PT Time Calculation (min) (ACUTE ONLY): 21 min   Charges:   PT Evaluation $PT Eval Low Complexity: [place], PT, DPT  Acute Rehabilitation  Services  Pager: [phone] Office: [phone]   Rudean Hitt 03/04/2019, 4:55 PM

## 2019-03-04 NOTE — Anesthesia Procedure Notes (Signed)
Procedure Name: MAC Date/Time: 03/04/2019 12:30 PM Performed by: Jenne Campus, CRNA Pre-anesthesia Checklist: Patient identified, Emergency Drugs available, Suction available and Patient being monitored Oxygen Delivery Method: Simple face mask

## 2019-03-04 NOTE — Brief Op Note (Signed)
03/04/2019  1:19 PM  PATIENT:  Latoya Adams  73 y.o. female  PRE-OPERATIVE DIAGNOSIS:  Osteoarthritis Left Hip  POST-OPERATIVE DIAGNOSIS:  Osteoarthritis Left Hip  PROCEDURE:  Procedure(s): LEFT TOTAL HIP ARTHROPLASTY ANTERIOR APPROACH (Left)  SURGEON:  Surgeon(s) and Role:    Mcarthur Rossetti, MD - Primary  PHYSICIAN ASSISTANT: Benita Stabile, PA-C  ANESTHESIA:   spinal  EBL:  300 mL   COUNTS:  YES  DICTATION: .Other Dictation: Dictation Number [id number]  PLAN OF CARE: Admit to inpatient   PATIENT DISPOSITION:  PACU - hemodynamically stable.   Delay start of Pharmacological VTE agent (>24hrs) due to surgical blood loss or risk of bleeding: no

## 2019-03-05 ENCOUNTER — Encounter (HOSPITAL_COMMUNITY): Payer: Self-pay | Admitting: Orthopaedic Surgery

## 2019-03-05 DIAGNOSIS — Z951 Presence of aortocoronary bypass graft: Secondary | ICD-10-CM | POA: Diagnosis not present

## 2019-03-05 DIAGNOSIS — I252 Old myocardial infarction: Secondary | ICD-10-CM | POA: Diagnosis not present

## 2019-03-05 DIAGNOSIS — Z95810 Presence of automatic (implantable) cardiac defibrillator: Secondary | ICD-10-CM | POA: Diagnosis not present

## 2019-03-05 DIAGNOSIS — I255 Ischemic cardiomyopathy: Secondary | ICD-10-CM | POA: Diagnosis present

## 2019-03-05 DIAGNOSIS — Z8249 Family history of ischemic heart disease and other diseases of the circulatory system: Secondary | ICD-10-CM | POA: Diagnosis not present

## 2019-03-05 DIAGNOSIS — I110 Hypertensive heart disease with heart failure: Secondary | ICD-10-CM | POA: Diagnosis present

## 2019-03-05 DIAGNOSIS — D62 Acute posthemorrhagic anemia: Secondary | ICD-10-CM | POA: Diagnosis not present

## 2019-03-05 DIAGNOSIS — Z885 Allergy status to narcotic agent status: Secondary | ICD-10-CM | POA: Diagnosis not present

## 2019-03-05 DIAGNOSIS — F329 Major depressive disorder, single episode, unspecified: Secondary | ICD-10-CM | POA: Diagnosis present

## 2019-03-05 DIAGNOSIS — F419 Anxiety disorder, unspecified: Secondary | ICD-10-CM | POA: Diagnosis present

## 2019-03-05 DIAGNOSIS — I2510 Atherosclerotic heart disease of native coronary artery without angina pectoris: Secondary | ICD-10-CM | POA: Diagnosis present

## 2019-03-05 DIAGNOSIS — Z87891 Personal history of nicotine dependence: Secondary | ICD-10-CM | POA: Diagnosis not present

## 2019-03-05 DIAGNOSIS — I5022 Chronic systolic (congestive) heart failure: Secondary | ICD-10-CM | POA: Diagnosis present

## 2019-03-05 DIAGNOSIS — M1612 Unilateral primary osteoarthritis, left hip: Secondary | ICD-10-CM | POA: Diagnosis present

## 2019-03-05 DIAGNOSIS — J432 Centrilobular emphysema: Secondary | ICD-10-CM | POA: Diagnosis present

## 2019-03-05 LAB — BASIC METABOLIC PANEL
Anion gap: 7 (ref 5–15)
BUN: 10 mg/dL (ref 8–23)
CO2: 24 mmol/L (ref 22–32)
Calcium: 8 mg/dL — ABNORMAL LOW (ref 8.9–10.3)
Chloride: 107 mmol/L (ref 98–111)
Creatinine, Ser: 0.79 mg/dL (ref 0.44–1.00)
GFR calc Af Amer: >60 mL/min (ref >60)
GFR calc non Af Amer: >60 mL/min (ref >60)
Glucose, Bld: 151 mg/dL — ABNORMAL HIGH (ref 70–99)
Potassium: 3.7 mmol/L (ref 3.5–5.1)
Sodium: 138 mmol/L (ref 135–145)

## 2019-03-05 LAB — CBC
HCT: 27.1 % — ABNORMAL LOW (ref 36.0–46.0)
Hemoglobin: 8.7 g/dL — ABNORMAL LOW (ref 12.0–15.0)
MCH: 29.7 pg (ref 26.0–34.0)
MCHC: 32.1 g/dL (ref 30.0–36.0)
MCV: 92.5 fL (ref 80.0–100.0)
Platelets: 258 10*3/uL (ref 150–400)
RBC: 2.93 MIL/uL — ABNORMAL LOW (ref 3.87–5.11)
RDW: 13.2 % (ref 11.5–15.5)
WBC: 10.2 10*3/uL (ref 4.0–10.5)
nRBC: 0 % (ref 0.0–0.2)

## 2019-03-05 NOTE — Care Management (Signed)
CM consult acknowledged to assist with any HH/DME needs.   Sheresa Cullop RN, BSN, NCM-BC, ACM-RN [phone] 

## 2019-03-05 NOTE — Progress Notes (Signed)
Patient ID: Latoya Adams, female   DOB: Apr 23, 1946, 73 y.o.   MRN: [id number] Given the patient's acute blood loss anemia combined with her significant cardiac history, she is not deemed medically stable to be discharge to home today.  I need to re-check her CBC tomorrow morning and make sure she remain stable from a BP stand point as well given her numerous meds.

## 2019-03-05 NOTE — Progress Notes (Signed)
Physical Therapy Treatment Patient Details Name: Latoya Adams MRN: [id number] DOB: [birth year] Today's Date: 03/05/2019    History of Present Illness Pt is a 73 y/o female s/p L THA, direct anterior. PMH includes CAD s/p CABG, CHF, HTN, and MI.     PT Comments    Pt limited by dizziness 2/2 orthostatic hypotension upon standing during session. Pt BP dropped to 83/36 (51) during standing, recovering to 103/41 (62) when seated with LE elevated. Pt demonstrates increased LLE activation during session, ambulating a short distance from bed to recliner with minimal physical assistance. Pt will benefit from continued PT POC to increase activity tolerance and reduce falls risk. Recommendations: Home with home health PT and a RW.   Follow Up Recommendations  Follow surgeon's recommendation for DC plan and follow-up therapies     Equipment Recommendations  Rolling walker with 5" wheels    Recommendations for Other Services       Precautions / Restrictions Precautions Precautions: Fall Restrictions Weight Bearing Restrictions: Yes LLE Weight Bearing: Weight bearing as tolerated    Mobility  Bed Mobility   Bed Mobility: Supine to Sit     Supine to sit: Min assist        Transfers Overall transfer level: Needs assistance Equipment used: Rolling walker (2 wheeled) Transfers: Sit to/from Stand Sit to Stand: Min guard            Ambulation/Gait Ambulation/Gait assistance: Min guard Gait Distance (Feet): 5 Feet Assistive device: Rolling walker (2 wheeled) Gait Pattern/deviations: Step-to pattern;Decreased step length - right;Decreased step length - left Gait velocity: Decreased Gait velocity interpretation: <1.31 ft/sec, indicative of household ambulator General Gait Details: slowed step to gait with reduced foot clearance bilaterally   Stairs             Wheelchair Mobility    Modified Rankin (Stroke Patients Only)       Balance Overall balance  assessment: Needs assistance Sitting-balance support: Feet supported;No upper extremity supported Sitting balance-Leahy Scale: Fair     Standing balance support: Bilateral upper extremity supported Standing balance-Leahy Scale: Fair Standing balance comment: Reliant on BUE support                             Cognition Arousal/Alertness: Awake/alert Behavior During Therapy: WFL for tasks assessed/performed Overall Cognitive Status: Within Functional Limits for tasks assessed                                        Exercises Total Joint Exercises Ankle Circles/Pumps: AROM;Both;20 reps Quad Sets: AROM;Both;10 reps Long Arc Quad: AROM;Both;10 reps    General Comments        Pertinent Vitals/Pain Pain Assessment: Faces Faces Pain Scale: Hurts a little bit Pain Location: L hip Pain Descriptors / Indicators: Aching;Operative site guarding Pain Intervention(s): Limited activity within patient's tolerance    Home Living                      Prior Function            PT Goals (current goals can now be found in the care plan section) Acute Rehab PT Goals Patient Stated Goal: to go home Progress towards PT goals: Progressing toward goals    Frequency    7X/week      PT Plan Current plan remains appropriate  Co-evaluation              AM-PAC PT "6 Clicks" Mobility   Outcome Measure  Help needed turning from your back to your side while in a flat bed without using bedrails?: A Little Help needed moving from lying on your back to sitting on the side of a flat bed without using bedrails?: A Little Help needed moving to and from a bed to a chair (including a wheelchair)?: A Little Help needed standing up from a chair using your arms (e.g., wheelchair or bedside chair)?: A Little Help needed to walk in hospital room?: A Little Help needed climbing 3-5 steps with a railing? : A Little 6 Click Score: 18    End of Session  Equipment Utilized During Treatment: Gait belt Activity Tolerance: Treatment limited secondary to medical complications (Comment)(orthostatic hypotension and dizziness) Patient left: in chair;with call bell/phone within reach Nurse Communication: Mobility status PT Visit Diagnosis: Unsteadiness on feet (R26.81);Muscle weakness (generalized) (M62.81);Pain Pain - Right/Left: Left Pain - part of body: Hip     Time: 4098-1191 PT Time Calculation (min) (ACUTE ONLY): 30 min  Charges:  $Therapeutic Exercise: 8-22 mins $Therapeutic Activity: 8-22 mins                     Arlyss Gandy, PT, DPT Acute Rehabilitation Pager: [phone]  Arlyss Gandy 03/05/2019, 9:26 AM

## 2019-03-05 NOTE — Progress Notes (Signed)
Physical Therapy Treatment Patient Details Name: Latoya Adams MRN: [id number] DOB: 1945/07/26 Today's Date: 03/05/2019    History of Present Illness Pt is a 73 y/o female s/p L THA, direct anterior. PMH includes CAD s/p CABG, CHF, HTN, and MI.     PT Comments    Pt tolerated treatment well, demonstrating increased tolerance for all functional mobility, and no reports of dizziness during session. Pt's BP did drop from 105/52 to 97/45 when transitioning from sit to stand, however pt asymptomatic. Pt will benefit from continued PT POC to perform stair training and increase tolerance for community ambulation. Recommendations: Home with home health and a RW.   Follow Up Recommendations  Follow surgeon's recommendation for DC plan and follow-up therapies     Equipment Recommendations  Rolling walker with 5" wheels    Recommendations for Other Services       Precautions / Restrictions Precautions Precautions: Fall Restrictions Weight Bearing Restrictions: Yes LLE Weight Bearing: Weight bearing as tolerated    Mobility  Bed Mobility Overal bed mobility: Needs Assistance Bed Mobility: Supine to Sit;Sit to Supine     Supine to sit: Supervision Sit to supine: Supervision   General bed mobility comments: pt hooking RLE under LLE to assist in mobility  Transfers Overall transfer level: Needs assistance Equipment used: Rolling walker (2 wheeled) Transfers: Sit to/from Stand Sit to Stand: Supervision            Ambulation/Gait Ambulation/Gait assistance: Supervision Gait Distance (Feet): 90 Feet Assistive device: Rolling walker (2 wheeled) Gait Pattern/deviations: Step-through pattern Gait velocity: Decreased Gait velocity interpretation: <1.8 ft/sec, indicate of risk for recurrent falls General Gait Details: pt with step to gait, progressing to step through pattern with equal WB between LE   Stairs             Wheelchair Mobility    Modified Rankin (Stroke  Patients Only)       Balance Overall balance assessment: Needs assistance Sitting-balance support: No upper extremity supported;Feet supported Sitting balance-Leahy Scale: Good     Standing balance support: No upper extremity supported Standing balance-Leahy Scale: Fair                              Cognition Arousal/Alertness: Awake/alert Behavior During Therapy: WFL for tasks assessed/performed Overall Cognitive Status: Within Functional Limits for tasks assessed                                        Exercises      General Comments        Pertinent Vitals/Pain Pain Assessment: Faces Faces Pain Scale: Hurts even more Pain Location: L hip Pain Descriptors / Indicators: Aching;Operative site guarding Pain Intervention(s): Limited activity within patient's tolerance    Home Living                      Prior Function            PT Goals (current goals can now be found in the care plan section) Acute Rehab PT Goals Patient Stated Goal: to go home Progress towards PT goals: Progressing toward goals    Frequency    7X/week      PT Plan Current plan remains appropriate    Co-evaluation              AM-PAC  PT "6 Clicks" Mobility   Outcome Measure  Help needed turning from your back to your side while in a flat bed without using bedrails?: None Help needed moving from lying on your back to sitting on the side of a flat bed without using bedrails?: None Help needed moving to and from a bed to a chair (including a wheelchair)?: None Help needed standing up from a chair using your arms (e.g., wheelchair or bedside chair)?: None Help needed to walk in hospital room?: None Help needed climbing 3-5 steps with a railing? : A Little 6 Click Score: 23    End of Session Equipment Utilized During Treatment: Gait belt Activity Tolerance: Patient tolerated treatment well Patient left: in bed;with call bell/phone within  reach;with bed alarm set;with SCD's reapplied Nurse Communication: Mobility status PT Visit Diagnosis: Unsteadiness on feet (R26.81);Muscle weakness (generalized) (M62.81);Pain Pain - Right/Left: Left Pain - part of body: Hip     Time: 2778-2423 PT Time Calculation (min) (ACUTE ONLY): 27 min  Charges:  $Gait Training: 8-22 mins $Therapeutic Activity: 8-22 mins                     Arlyss Gandy, PT, DPT Acute Rehabilitation Pager: [phone]    Arlyss Gandy 03/05/2019, 2:57 PM

## 2019-03-05 NOTE — Progress Notes (Signed)
Subjective: 1 Day Post-Op Procedure(s) (LRB): LEFT TOTAL HIP ARTHROPLASTY ANTERIOR APPROACH (Left) Patient reports pain as mild.  States poor pain control overnight resulting in poor sleep. Otherwise pain now under better control and no other complaints.   Objective: Vital signs in last 24 hours: Temp:  [97.6 F (36.4 C)-98.3 F (36.8 C)] 98.2 F (36.8 C) (09/30 0459) Pulse Rate:  [65-85] 79 (09/30 0459) Resp:  [11-19] 18 (09/30 0459) BP: (96-121)/(45-68) 121/50 (09/30 0459) SpO2:  [91 %-98 %] 93 % (09/30 0459) Weight:  [61.2 kg] 61.2 kg (09/29 1035)  Intake/Output from previous day: 09/29 0701 - 09/30 0700 In: 9702 [P.O.:240; I.V.:1000; IV Piggyback:250] Out: 6378 [Urine:1250; Blood:300] Intake/Output this shift: No intake/output data recorded.  Recent Labs    03/05/19 0206  HGB 8.7*   Recent Labs    03/05/19 0206  WBC 10.2  RBC 2.93*  HCT 27.1*  PLT 258   Recent Labs    03/05/19 0206  NA 138  K 3.7  CL 107  CO2 24  BUN 10  CREATININE 0.79  GLUCOSE 151*  CALCIUM 8.0*   No results for input(s): LABPT, INR in the last 72 hours.  Left lower extremity:  Sensation intact distally Intact pulses distally Dorsiflexion/Plantar flexion intact Incision: dressing C/D/I Compartment soft   Assessment/Plan: 1 Day Post-Op Procedure(s) (LRB): LEFT TOTAL HIP ARTHROPLASTY ANTERIOR APPROACH (Left) Up with therapy  Acute on chronic anemia monitor for symptoms of anemia.     Kailen Name 03/05/2019, 8:03 AM

## 2019-03-06 LAB — CBC
HCT: 25.9 % — ABNORMAL LOW (ref 36.0–46.0)
Hemoglobin: 8.3 g/dL — ABNORMAL LOW (ref 12.0–15.0)
MCH: 29.9 pg (ref 26.0–34.0)
MCHC: 32 g/dL (ref 30.0–36.0)
MCV: 93.2 fL (ref 80.0–100.0)
Platelets: 229 10*3/uL (ref 150–400)
RBC: 2.78 MIL/uL — ABNORMAL LOW (ref 3.87–5.11)
RDW: 13.7 % (ref 11.5–15.5)
WBC: 8.7 10*3/uL (ref 4.0–10.5)
nRBC: 0 % (ref 0.0–0.2)

## 2019-03-06 MED ORDER — METHOCARBAMOL 500 MG PO TABS: 500.0000 mg | ORAL_TABLET | Freq: Four times a day (QID) | ORAL | 1 refills | Status: DC | PRN

## 2019-03-06 MED ORDER — ASPIRIN 81 MG PO CHEW: 81.0000 mg | CHEWABLE_TABLET | Freq: Two times a day (BID) | ORAL | 0 refills | Status: DC

## 2019-03-06 MED ORDER — HYDROCODONE-ACETAMINOPHEN 5-325 MG PO TABS: 1.0000 | ORAL_TABLET | ORAL | 0 refills | Status: DC | PRN

## 2019-03-06 NOTE — TOC Transition Note (Signed)
Transition of Care [facility]) - CM/SW Discharge Note   Patient Details  Name: Latoya Adams MRN: [id number] Date of Birth: 03/18/1946  Transition of Care [facility]) CM/SW Contact:  Midge Minium RN, BSN, NCM-BC, ACM-RN [phone] Phone Number: 03/06/2019, 1:40 PM   Clinical Narrative:    Patient medically stable to transition home. CM met with patient to discuss the POC. Patient lives at home alone, but reports her daughter will be staying post-discharge. PCP/Demo verified. Patient is s/p L THA; PT eval complete with HH/DME recommended; patient states having a BSC and shower seat. CMS HH Compare and DME preferences provided with [facility] and [facility] selected. [place] referral given to Dorian Pod RN [facility] liaison); DME referral given to [place] ([facility] liaison); AVS updated. Patient states that her family will provide transportation home at 1500 today. DME will be delivered to her room prior to discharge. No further needs from CM.    Final next level of care: [place] Barriers to Discharge: No Barriers Identified   Patient Goals and CMS Choice Patient states their goals for this hospitalization and ongoing recovery are:: "to continue recovering" CMS Medicare.gov Compare Post Acute Care list provided to:: Patient Choice offered to / list presented to : Patient  Discharge Plan and Services                DME Arranged: Walker rolling DME Agency: [facility] Date DME Agency Contacted: 03/06/19 Time DME Agency Contacted: 85 Representative spoke with at DME Agency: [place]: PT, OT [place] Agency: [facility] [place] Date [place]: 03/06/19 Time [place]: 0567 Representative spoke with at [place]: Gwenith Spitz (liaison)  Social Determinants of Health ([place]) Interventions     Readmission Risk Interventions No flowsheet data found.

## 2019-03-06 NOTE — Plan of Care (Signed)
  Problem: Pain Managment: Goal: General experience of comfort will improve 03/06/2019 1345 by Mariane Baumgarten, RN Outcome: Progressing 03/06/2019 1111 by Mariane Baumgarten, RN Outcome: Progressing

## 2019-03-06 NOTE — Progress Notes (Signed)
PT Cancellation Note  Patient Details Name: Latoya Adams MRN: [id number] DOB: [birth year]   Cancelled Treatment:    Reason Eval/Treat Not Completed: Patient declined, no reason specified. PT attempted to see patient however patient declining at this time. Pt preparing to discharge and reports no concerns about mobility at this time.  Zenaida Niece, PT, DPT Acute Rehabilitation Pager: [phone]    Zenaida Niece 03/06/2019, 2:43 PM

## 2019-03-06 NOTE — Discharge Instructions (Signed)

## 2019-03-06 NOTE — Progress Notes (Signed)
Subjective: 2 Days Post-Op Procedure(s) (LRB): LEFT TOTAL HIP ARTHROPLASTY ANTERIOR APPROACH (Left) Patient reports pain as mild.  Hgb dropped a little from yesterday.  Asymptomatic and vitals stable.  Objective: Vital signs in last 24 hours: Temp:  [98.1 F (36.7 C)-98.5 F (36.9 C)] 98.5 F (36.9 C) (10/01 0521) Pulse Rate:  [67-89] 89 (10/01 0521) Resp:  [15-17] 15 (10/01 0521) BP: (101-115)/(43-50) 113/50 (10/01 0521) SpO2:  [93 %-98 %] 93 % (10/01 0521)  Intake/Output from previous day: 09/30 0701 - 10/01 0700 In: 1340 [P.O.:1340] Out: -  Intake/Output this shift: No intake/output data recorded.  Recent Labs    03/05/19 0206 03/06/19 0257  HGB 8.7* 8.3*   Recent Labs    03/05/19 0206 03/06/19 0257  WBC 10.2 8.7  RBC 2.93* 2.78*  HCT 27.1* 25.9*  PLT 258 229   Recent Labs    03/05/19 0206  NA 138  K 3.7  CL 107  CO2 24  BUN 10  CREATININE 0.79  GLUCOSE 151*  CALCIUM 8.0*   No results for input(s): LABPT, INR in the last 72 hours.  Sensation intact distally Intact pulses distally Dorsiflexion/Plantar flexion intact Incision: scant drainage   Assessment/Plan: 2 Days Post-Op Procedure(s) (LRB): LEFT TOTAL HIP ARTHROPLASTY ANTERIOR APPROACH (Left) Up with therapy Discharge home with home health likely this afternoon.   Anticipated LOS equal to or greater than 2 midnights due to - Age 20 and older with one or more of the following:  - Obesity  - Expected need for hospital services (PT, OT, Nursing) required for safe  discharge  - Anticipated need for postoperative skilled nursing care or inpatient rehab  - Active co-morbidities: Coronary Artery Disease OR   - Unanticipated findings during/Post Surgery: None  - Patient is a high risk of re-admission due to: None    Mcarthur Rossetti 03/06/2019, 7:07 AM

## 2019-03-06 NOTE — Progress Notes (Signed)
Subjective: 2 Days Post-Op Procedure(s) (LRB): LEFT TOTAL HIP ARTHROPLASTY ANTERIOR APPROACH (Left) Patient reports pain as mild.  Concerned she may not have a ride home today is going to call her son.   Objective: Vital signs in last 24 hours: Temp:  [98.1 F (36.7 C)-98.5 F (36.9 C)] 98.5 F (36.9 C) (10/01 0521) Pulse Rate:  [67-89] 89 (10/01 0521) Resp:  [15-16] 15 (10/01 0521) BP: (103-115)/(44-50) 113/50 (10/01 0521) SpO2:  [93 %-98 %] 93 % (10/01 0521)  Intake/Output from previous day: 09/30 0701 - 10/01 0700 In: 1340 [P.O.:1340] Out: -  Intake/Output this shift: Total I/O In: 120 [P.O.:120] Out: -   Recent Labs    03/05/19 0206 03/06/19 0257  HGB 8.7* 8.3*   Recent Labs    03/05/19 0206 03/06/19 0257  WBC 10.2 8.7  RBC 2.93* 2.78*  HCT 27.1* 25.9*  PLT 258 229   Recent Labs    03/05/19 0206  NA 138  K 3.7  CL 107  CO2 24  BUN 10  CREATININE 0.79  GLUCOSE 151*  CALCIUM 8.0*   No results for input(s): LABPT, INR in the last 72 hours.  Left hip: Dorsiflexion/Plantar flexion intact Incision: scant drainage Compartment soft   Assessment/Plan: 2 Days Post-Op Procedure(s) (LRB): LEFT TOTAL HIP ARTHROPLASTY ANTERIOR APPROACH (Left) Up with therapy  Discharge to home later today if patient's family available.       Latoya Adams 03/06/2019, 1:08 PM

## 2019-03-06 NOTE — Plan of Care (Signed)
  Problem: Pain Managment: Goal: General experience of comfort will improve Outcome: Progressing   

## 2019-03-06 NOTE — Progress Notes (Signed)
Physical Therapy Treatment Patient Details Name: Latoya Adams MRN: [id number] DOB: 01-Feb-1946 Today's Date: 03/06/2019    History of Present Illness Pt is a 73 y/o female s/p L THA, direct anterior. PMH includes CAD s/p CABG, CHF, HTN, and MI.     PT Comments    Pt tolerated treatment well, progressing to stair negotiation and car transfer without physical assistance requirements. Pt also increasd ambulation tolerance and ambulates with longer step and stride length and more equal stance time. Pt will benefit from continued PT POC to improve independence in bed mobility and improve activity tolerance. Recommendations: Home with home health PT and a RW.   Follow Up Recommendations  Follow surgeon's recommendation for DC plan and follow-up therapies     Equipment Recommendations  Rolling walker with 5" wheels    Recommendations for Other Services       Precautions / Restrictions Precautions Precautions: Fall Restrictions Weight Bearing Restrictions: Yes LLE Weight Bearing: Weight bearing as tolerated    Mobility  Bed Mobility   Bed Mobility: Sit to Supine;Supine to Sit     Supine to sit: Supervision Sit to supine: Min assist   General bed mobility comments: pt requiring physical assistance to mobilize LLE onto bed  Transfers Overall transfer level: Needs assistance Equipment used: Rolling walker (2 wheeled) Transfers: Sit to/from Stand Sit to Stand: Supervision         General transfer comment: pt also simulating car transfer on mat table with PT supervision and use of RW  Ambulation/Gait Ambulation/Gait assistance: Supervision Gait Distance (Feet): 100 Feet Assistive device: Rolling walker (2 wheeled) Gait Pattern/deviations: Step-through pattern Gait velocity: Decreased Gait velocity interpretation: <1.8 ft/sec, indicate of risk for recurrent falls General Gait Details: pt with step to gait, progressing to step through pattern with equal WB between  LE   Stairs Stairs: Yes Stairs assistance: Min guard Stair Management: Two rails;Step to pattern;Forwards Number of Stairs: 2     Wheelchair Mobility    Modified Rankin (Stroke Patients Only)       Balance Overall balance assessment: Needs assistance Sitting-balance support: Single extremity supported;Feet supported Sitting balance-Leahy Scale: Fair     Standing balance support: Single extremity supported Standing balance-Leahy Scale: Fair                              Cognition Arousal/Alertness: Awake/alert Behavior During Therapy: WFL for tasks assessed/performed Overall Cognitive Status: Within Functional Limits for tasks assessed                                        Exercises      General Comments        Pertinent Vitals/Pain Pain Assessment: Faces Faces Pain Scale: Hurts little more Pain Location: L hip Pain Descriptors / Indicators: Aching;Operative site guarding Pain Intervention(s): Limited activity within patient's tolerance    Home Living                      Prior Function            PT Goals (current goals can now be found in the care plan section) Acute Rehab PT Goals Patient Stated Goal: to go home Progress towards PT goals: Progressing toward goals    Frequency    7X/week      PT Plan Current plan remains  appropriate    Co-evaluation              AM-PAC PT "6 Clicks" Mobility   Outcome Measure  Help needed turning from your back to your side while in a flat bed without using bedrails?: None Help needed moving from lying on your back to sitting on the side of a flat bed without using bedrails?: A Little Help needed moving to and from a bed to a chair (including a wheelchair)?: None Help needed standing up from a chair using your arms (e.g., wheelchair or bedside chair)?: None Help needed to walk in hospital room?: None Help needed climbing 3-5 steps with a railing? : None 6  Click Score: 23    End of Session Equipment Utilized During Treatment: Gait belt Activity Tolerance: Patient tolerated treatment well Patient left: in bed;with call bell/phone within reach;with bed alarm set;with SCD's reapplied Nurse Communication: Mobility status PT Visit Diagnosis: Unsteadiness on feet (R26.81);Muscle weakness (generalized) (M62.81);Pain Pain - Right/Left: Left Pain - part of body: Hip     Time: 0922-0952 PT Time Calculation (min) (ACUTE ONLY): 30 min  Charges:  $Gait Training: 8-22 mins $Therapeutic Activity: 8-22 mins                     Zenaida Niece, PT, DPT Acute Rehabilitation Pager: [phone]    Zenaida Niece 03/06/2019, 11:05 AM

## 2019-03-06 NOTE — Discharge Summary (Signed)
Patient ID: Latoya Adams MRN: [id number] DOB/AGE: 08-08-1945 73 y.o.  Admit date: 03/04/2019 Discharge date: 03/06/2019  Admission Diagnoses:  Principal Problem:   Unilateral primary osteoarthritis, left hip Active Problems:   Status post total replacement of left hip   Discharge Diagnoses:  Status post left total hip arthroplasty Acute on chronic anemia asymptomatic.  Past Medical History:  Diagnosis Date  . AICD (automatic cardioverter/defibrillator) present 09/20/2017  . Alcohol abuse   . Anxiety   . Arthritis   . CHF (congestive heart failure) ([place])   . Coronary artery disease   . Depression   . Dyspnea    due to Pulmonary Effusion  . History of blood transfusion 2018   during CABG  . Hypertension   . Myocardial infarction ([place]) 12/2016    Surgeries: Procedure(s): LEFT TOTAL HIP ARTHROPLASTY ANTERIOR APPROACH on 03/04/2019   Consultants:   Discharged Condition: Improved  Hospital Course: Latoya Adams is an 73 y.o. female who was admitted 03/04/2019 for operative treatment ofUnilateral primary osteoarthritis, left hip. Patient has severe unremitting pain that affects sleep, daily activities, and work/hobbies. After pre-op clearance the patient was taken to the operating room on 03/04/2019 and underwent  Procedure(s): LEFT TOTAL HIP ARTHROPLASTY ANTERIOR APPROACH.    Patient was given perioperative antibiotics:  Anti-infectives (From admission, onward)   Start     Dose/Rate Route Frequency Ordered Stop   03/04/19 1800  ceFAZolin (ANCEF) IVPB 1 g/50 mL premix     1 g 100 mL/hr over 30 Minutes Intravenous Every 6 hours 03/04/19 1516 03/04/19 2338   03/04/19 1100  ceFAZolin (ANCEF) IVPB 2g/100 mL premix     2 g 200 mL/hr over 30 Minutes Intravenous On call to O.R. 03/04/19 1049 03/04/19 1229   03/04/19 1053  ceFAZolin (ANCEF) 2-4 GM/100ML-% IVPB    Note to Pharmacy: Tamsen Snider   : cabinet override      03/04/19 1053 03/04/19 1214       Patient was given  sequential compression devices, early ambulation, and chemoprophylaxis to prevent DVT.  Patient benefited maximally from hospital stay and there were no complications.    Recent vital signs:  Patient Vitals for the past 24 hrs:  BP Temp Temp src Pulse Resp SpO2  03/06/19 0521 (!) 113/50 98.5 F (36.9 C) Oral 89 15 93 %  03/05/19 2018 (!) 115/44 98.2 F (36.8 C) Oral 78 16 94 %  03/05/19 1419 (!) 103/46 98.1 F (36.7 C) Oral 67 16 98 %     Recent laboratory studies:  Recent Labs    03/05/19 0206 03/06/19 0257  WBC 10.2 8.7  HGB 8.7* 8.3*  HCT 27.1* 25.9*  PLT 258 229  NA 138  --   K 3.7  --   CL 107  --   CO2 24  --   BUN 10  --   CREATININE 0.79  --   GLUCOSE 151*  --   CALCIUM 8.0*  --      Discharge Medications:   Allergies as of 03/06/2019      Reactions   Codeine Hives      Medication List    STOP taking these medications   aspirin EC 81 MG tablet Replaced by: aspirin 81 MG chewable tablet     TAKE these medications   aspirin 81 MG chewable tablet Chew 1 tablet (81 mg total) by mouth 2 (two) times daily. Replaces: aspirin EC 81 MG tablet   atorvastatin 80 MG tablet Commonly known as:  LIPITOR TAKE 1 TABLET DAILY AT 6 PM What changed: See the new instructions.   buPROPion 300 MG 24 hr tablet Commonly known as: WELLBUTRIN XL Take 300 mg by mouth daily.   carvedilol 12.5 MG tablet Commonly known as: COREG TAKE 1 TABLET TWICE DAILY What changed: when to take this   digoxin 0.125 MG tablet Commonly known as: LANOXIN Take 1 tablet (125 mcg total) by mouth daily.   furosemide 40 MG tablet Commonly known as: LASIX Take 1 tablet (40 mg total) by mouth every Monday, Wednesday, and Friday. Can take extra 40 mg if needed for fluid   HYDROcodone-acetaminophen 5-325 MG tablet Commonly known as: NORCO/VICODIN Take 1-2 tablets by mouth every 4 (four) hours as needed for moderate pain (pain score 4-6).   methocarbamol 500 MG tablet Commonly known as:  ROBAXIN Take 1 tablet (500 mg total) by mouth every 6 (six) hours as needed for muscle spasms.   nitroGLYCERIN 0.4 MG SL tablet Commonly known as: NITROSTAT Place 1 tablet (0.4 mg total) under the tongue every 5 (five) minutes as needed for chest pain.   potassium chloride SA 20 MEQ tablet Commonly known as: KLOR-CON Take 1 tablet (20 mEq total) by mouth 3 (three) times a week. Every Mon, Wed and Friday.  Take extra tab when you take extra Furosemide What changed:   when to take this  additional instructions   sacubitril-valsartan 24-26 MG Commonly known as: Entresto Take 1 tablet by mouth 2 (two) times daily.   sertraline 100 MG tablet Commonly known as: ZOLOFT Take 150 mg by mouth daily.   spironolactone 25 MG tablet Commonly known as: ALDACTONE TAKE 1 TABLET EVERY DAY   traMADol 50 MG tablet Commonly known as: ULTRAM Take 1-2 tablets (50-100 mg total) by mouth every 4 (four) hours as needed for moderate pain.   Vitamin D2 10 MCG (400 UNIT) Tabs Take 400 Units by mouth 2 (two) times daily.            Durable Medical Equipment  (From admission, onward)         Start     Ordered   03/04/19 1517  DME Walker rolling  Once    Question:  Patient needs a walker to treat with the following condition  Answer:  Status post total replacement of left hip   03/04/19 1516   03/04/19 1517  DME 3 n 1  Once     03/04/19 1516          Diagnostic Studies: Dg Pelvis Portable  Result Date: 03/04/2019 CLINICAL DATA:  73 year old female with history of left hip replacement. EXAM: PORTABLE PELVIS 1-2 VIEWS COMPARISON:  03/04/2019. FINDINGS: New left total hip arthroplasty. The acetabular and femoral components appear well seated, without definite periprosthetic fracture or other acute complicating features. A small amount of gas in the joint space and overlying soft tissues. Superficial skin staples adjacent to the left hip joint. Joint space narrowing, subchondral sclerosis,  subchondral cyst formation and osteophyte formation in the right hip joint, compatible with severe osteoarthritis. IMPRESSION: 1. Status post left total hip arthroplasty, without immediate complicating features. Electronically Signed   By: Trudie Reed M.D.   On: 03/04/2019 13:56   Dg C-arm 1-60 Min  Result Date: 03/04/2019 CLINICAL DATA:  Left hip replacement EXAM: OPERATIVE LEFT HIP (WITH PELVIS IF PERFORMED) 4 VIEWS TECHNIQUE: Fluoroscopic spot image(s) were submitted for interpretation post-operatively. COMPARISON:  None. FINDINGS: Interval left total hip arthroplasty. No hardware failure complication. Moderate osteoarthritis  of the right hip. IMPRESSION: Interval left total hip arthroplasty. Electronically Signed   By: Elige Ko   On: 03/04/2019 13:33   Dg Hip Operative Unilat With Pelvis Left  Result Date: 03/04/2019 CLINICAL DATA:  Left hip replacement EXAM: OPERATIVE LEFT HIP (WITH PELVIS IF PERFORMED) 4 VIEWS TECHNIQUE: Fluoroscopic spot image(s) were submitted for interpretation post-operatively. COMPARISON:  None. FINDINGS: Interval left total hip arthroplasty. No hardware failure complication. Moderate osteoarthritis of the right hip. IMPRESSION: Interval left total hip arthroplasty. Electronically Signed   By: Elige Ko   On: 03/04/2019 13:33    Disposition:     Follow-up Information    Kathryne Hitch, MD Follow up in 2 week(s).   Specialty: Orthopedic Surgery Contact information: [address] [place] [address] [phone]            Signed: Richardean Canal 03/06/2019, 1:11 PM

## 2019-03-07 NOTE — Anesthesia Postprocedure Evaluation (Signed)
Anesthesia Post Note  Patient: Latoya Adams  Procedure(s) Performed: LEFT TOTAL HIP ARTHROPLASTY ANTERIOR APPROACH (Left Hip)     Patient location during evaluation: PACU Anesthesia Type: Spinal and MAC Level of consciousness: oriented and awake and alert Pain management: pain level controlled Vital Signs Assessment: post-procedure vital signs reviewed and stable Respiratory status: spontaneous breathing, respiratory function stable and patient connected to nasal cannula oxygen Cardiovascular status: blood pressure returned to baseline and stable Postop Assessment: no headache, no backache and no apparent nausea or vomiting Anesthetic complications: no    Last Vitals:  Vitals:   03/05/19 2018 03/06/19 0521  BP: (!) 115/44 (!) 113/50  Pulse: 78 89  Resp: 16 15  Temp: 36.8 C 36.9 C  SpO2: 94% 93%    Last Pain:  Vitals:   03/06/19 0954  TempSrc:   PainSc: [place]

## 2019-03-18 ENCOUNTER — Encounter: Payer: Self-pay | Admitting: Orthopaedic Surgery

## 2019-03-18 ENCOUNTER — Ambulatory Visit (INDEPENDENT_AMBULATORY_CARE_PROVIDER_SITE_OTHER): Payer: Medicare PPO | Admitting: Orthopaedic Surgery

## 2019-03-18 ENCOUNTER — Other Ambulatory Visit: Payer: Self-pay

## 2019-03-18 DIAGNOSIS — Z96642 Presence of left artificial hip joint: Secondary | ICD-10-CM

## 2019-03-18 MED ORDER — GABAPENTIN 100 MG PO CAPS: 300.0000 mg | ORAL_CAPSULE | Freq: Every day | ORAL | 1 refills | Status: DC

## 2019-03-18 MED ORDER — HYDROCODONE-ACETAMINOPHEN 5-325 MG PO TABS: 1.0000 | ORAL_TABLET | ORAL | 0 refills | Status: DC | PRN

## 2019-03-18 NOTE — Progress Notes (Signed)
HPI: [name] returns today 2 weeks status post left total hip orthoplastic.  She is overall doing well from a hip standpoint.  However unfortunately she developed numbness tingling down her left leg.  She has had no new injury.  States this began gradually and has gotten worse.  She is having difficulty sleeping.  She said no bowel bladder dysfunction.  Physical exam: General well-developed well-nourished female no acute distress ambulating with a rolling walker.  Bilateral lower extremities: 5 out of 5 strength throughout the lower extremities.  Negative straight leg raise bilaterally.  Good range of motion of both hips.  Left hip surgical incisions healing well no signs of infection.  Wounds well approximated with staples.  Leg lengths are nearly equal.  Impression: Status post left total hip arthroplasty  Plan: She will go back on her 81 mg aspirin once daily.  We will send him Neurontin for her to take 300 mg at night.  Refill on her hydrocodone was given.  She will continue her Robaxin.  We will see her back in 4 weeks sooner if there is any questions concerns.  Staples were removed today Steri-Strips applied.

## 2019-03-24 ENCOUNTER — Ambulatory Visit (INDEPENDENT_AMBULATORY_CARE_PROVIDER_SITE_OTHER): Payer: Medicare PPO | Admitting: *Deleted

## 2019-03-24 DIAGNOSIS — I255 Ischemic cardiomyopathy: Secondary | ICD-10-CM

## 2019-03-24 DIAGNOSIS — I5022 Chronic systolic (congestive) heart failure: Secondary | ICD-10-CM

## 2019-03-24 LAB — CUP PACEART REMOTE DEVICE CHECK
Battery Remaining Longevity: 121 mo
Battery Voltage: 3.01 V
Brady Statistic RV Percent Paced: 0.01 %
Date Time Interrogation Session: 20201019080304
HighPow Impedance: 78 Ohm
Implantable Lead Implant Date: 20190418
Implantable Lead Location: 753860
Implantable Pulse Generator Implant Date: 20190418
Lead Channel Impedance Value: 361 Ohm
Lead Channel Impedance Value: 418 Ohm
Lead Channel Pacing Threshold Amplitude: 0.875 V
Lead Channel Pacing Threshold Pulse Width: 0.4 ms
Lead Channel Sensing Intrinsic Amplitude: 19.25 mV
Lead Channel Sensing Intrinsic Amplitude: 19.25 mV
Lead Channel Setting Pacing Amplitude: 2.5 V
Lead Channel Setting Pacing Pulse Width: 0.4 ms
Lead Channel Setting Sensing Sensitivity: 0.3 mV

## 2019-04-11 NOTE — Progress Notes (Signed)
Remote ICD transmission.   

## 2019-04-15 ENCOUNTER — Ambulatory Visit (INDEPENDENT_AMBULATORY_CARE_PROVIDER_SITE_OTHER): Payer: Medicare PPO | Admitting: Orthopaedic Surgery

## 2019-04-15 ENCOUNTER — Encounter: Payer: Self-pay | Admitting: Orthopaedic Surgery

## 2019-04-15 DIAGNOSIS — Z96642 Presence of left artificial hip joint: Secondary | ICD-10-CM

## 2019-04-15 NOTE — Progress Notes (Signed)
The patient is now between 6 and 7-week status post a left total hip arthroplasty.  She says she is doing well overall and has no issues.  She says she should have done this a while ago.  On examination both hips move smoothly and fluidly but they do have pain with internal extra rotation on both her hips.  Her left operative hip she says hurts actually less.  She does have normal pains it relates to hip replacement surgery itself.  Her ligaments are equal.  She is walking without a limp or without assistive device.  She is very active at 73 years old.  She does have a history of heart failure.  At this point should continue creaser activities as comfort allows.  We will see her back in 6 months with a standing AP pelvis and lateral of both hips.

## 2019-05-23 ENCOUNTER — Other Ambulatory Visit (HOSPITAL_COMMUNITY): Payer: Self-pay

## 2019-05-23 MED ORDER — ENTRESTO 24-26 MG PO TABS: 1.0000 | ORAL_TABLET | Freq: Two times a day (BID) | ORAL | 3 refills | Status: DC

## 2019-05-26 ENCOUNTER — Other Ambulatory Visit (HOSPITAL_COMMUNITY): Payer: Self-pay | Admitting: Internal Medicine

## 2019-06-23 ENCOUNTER — Ambulatory Visit (INDEPENDENT_AMBULATORY_CARE_PROVIDER_SITE_OTHER): Payer: Medicare PPO | Admitting: *Deleted

## 2019-06-23 DIAGNOSIS — I5022 Chronic systolic (congestive) heart failure: Secondary | ICD-10-CM

## 2019-06-23 LAB — CUP PACEART REMOTE DEVICE CHECK
Battery Remaining Longevity: 119 mo
Battery Voltage: 2.99 V
Brady Statistic RV Percent Paced: 0 %
Date Time Interrogation Session: 20210118123331
HighPow Impedance: 69 Ohm
Implantable Lead Implant Date: 20190418
Implantable Lead Location: 753860
Implantable Pulse Generator Implant Date: 20190418
Lead Channel Impedance Value: 361 Ohm
Lead Channel Impedance Value: 399 Ohm
Lead Channel Pacing Threshold Amplitude: 0.875 V
Lead Channel Pacing Threshold Pulse Width: 0.4 ms
Lead Channel Sensing Intrinsic Amplitude: 16.625 mV
Lead Channel Sensing Intrinsic Amplitude: 16.625 mV
Lead Channel Setting Pacing Amplitude: 2.5 V
Lead Channel Setting Pacing Pulse Width: 0.4 ms
Lead Channel Setting Sensing Sensitivity: 0.3 mV

## 2019-06-24 NOTE — Progress Notes (Signed)
ICD remote 

## 2019-06-26 ENCOUNTER — Other Ambulatory Visit (HOSPITAL_COMMUNITY): Payer: Self-pay | Admitting: Student

## 2019-07-03 ENCOUNTER — Telehealth: Payer: Self-pay | Admitting: *Deleted

## 2019-07-03 NOTE — Telephone Encounter (Signed)
I spoke with Latoya Adams, she stated I'm doing okay and didn't need to see Dr.Arida.

## 2019-09-11 ENCOUNTER — Encounter (HOSPITAL_COMMUNITY): Payer: Medicare PPO | Admitting: Internal Medicine

## 2019-09-22 ENCOUNTER — Ambulatory Visit (INDEPENDENT_AMBULATORY_CARE_PROVIDER_SITE_OTHER): Payer: Medicare PPO | Admitting: *Deleted

## 2019-09-22 DIAGNOSIS — I5022 Chronic systolic (congestive) heart failure: Secondary | ICD-10-CM

## 2019-09-22 LAB — CUP PACEART REMOTE DEVICE CHECK
Battery Remaining Longevity: 116 mo
Battery Voltage: 3 V
Brady Statistic RV Percent Paced: 0.01 %
Date Time Interrogation Session: 20210419033424
HighPow Impedance: 74 Ohm
Implantable Lead Implant Date: 20190418
Implantable Lead Location: 753860
Implantable Pulse Generator Implant Date: 20190418
Lead Channel Impedance Value: 342 Ohm
Lead Channel Impedance Value: 456 Ohm
Lead Channel Pacing Threshold Amplitude: 0.875 V
Lead Channel Pacing Threshold Pulse Width: 0.4 ms
Lead Channel Sensing Intrinsic Amplitude: 17.625 mV
Lead Channel Sensing Intrinsic Amplitude: 17.625 mV
Lead Channel Setting Pacing Amplitude: 2.5 V
Lead Channel Setting Pacing Pulse Width: 0.4 ms
Lead Channel Setting Sensing Sensitivity: 0.3 mV

## 2019-09-22 NOTE — Progress Notes (Signed)
ICD Remote  

## 2019-10-13 ENCOUNTER — Encounter: Payer: Self-pay | Admitting: Orthopaedic Surgery

## 2019-10-13 ENCOUNTER — Other Ambulatory Visit: Payer: Self-pay

## 2019-10-13 ENCOUNTER — Ambulatory Visit: Payer: Medicare PPO | Admitting: Orthopaedic Surgery

## 2019-10-13 ENCOUNTER — Ambulatory Visit (INDEPENDENT_AMBULATORY_CARE_PROVIDER_SITE_OTHER): Payer: Medicare PPO

## 2019-10-13 DIAGNOSIS — Z96642 Presence of left artificial hip joint: Secondary | ICD-10-CM

## 2019-10-13 NOTE — Progress Notes (Signed)
The patient is now between 8 and 9 months status post a left total hip arthroplasty.  He continues doing very well overall and has more good days and bad days.  He says a lot of it is just her not moving as much as she should.  She is very active 74 year old female and very thin.  She denies any right hip pain at all.  On exam both hips move smoothly but her right hip does hurt on extremes of rotation.  Her left hip moves smoothly normally with no pain.  Her leg lengths are equal.  A low AP pelvis and lateral both hips are obtained.  She does show some moderate arthritic changes still on her right hip with joint space narrowing and peritracheal osteophytes.  Her left total hip arthroplasty appears stable with early bone ingrowth.  At this point since she is doing so well follow-up can be as needed.  She understands that if her right hip starts bothering her in any way she can come see [place] at any time.  Also, if she develops any type of dull aching pain to the left hip she will let us know.  All questions and concerns were answered and addressed.

## 2019-10-17 ENCOUNTER — Other Ambulatory Visit (HOSPITAL_COMMUNITY): Payer: Self-pay

## 2019-10-17 MED ORDER — DIGOXIN 125 MCG PO TABS: 125.0000 ug | ORAL_TABLET | Freq: Every day | ORAL | 0 refills | Status: DC

## 2019-10-28 ENCOUNTER — Encounter (HOSPITAL_COMMUNITY): Payer: Self-pay | Admitting: Internal Medicine

## 2019-10-28 ENCOUNTER — Ambulatory Visit (HOSPITAL_COMMUNITY)
Admission: RE | Admit: 2019-10-28 | Discharge: 2019-10-28 | Disposition: A | Discharge status: Home / Self Care | Payer: Medicare PPO | Source: Ambulatory Visit | Attending: Internal Medicine | Admitting: Internal Medicine

## 2019-10-28 ENCOUNTER — Other Ambulatory Visit: Payer: Self-pay

## 2019-10-28 VITALS — BP 134/62 | HR 88 | Wt 136.0 lb

## 2019-10-28 DIAGNOSIS — Z885 Allergy status to narcotic agent status: Secondary | ICD-10-CM | POA: Diagnosis not present

## 2019-10-28 DIAGNOSIS — Z79899 Other long term (current) drug therapy: Secondary | ICD-10-CM | POA: Insufficient documentation

## 2019-10-28 DIAGNOSIS — I110 Hypertensive heart disease with heart failure: Secondary | ICD-10-CM | POA: Insufficient documentation

## 2019-10-28 DIAGNOSIS — Z96642 Presence of left artificial hip joint: Secondary | ICD-10-CM | POA: Insufficient documentation

## 2019-10-28 DIAGNOSIS — Z951 Presence of aortocoronary bypass graft: Secondary | ICD-10-CM | POA: Insufficient documentation

## 2019-10-28 DIAGNOSIS — I5022 Chronic systolic (congestive) heart failure: Secondary | ICD-10-CM

## 2019-10-28 DIAGNOSIS — I739 Peripheral vascular disease, unspecified: Secondary | ICD-10-CM | POA: Diagnosis not present

## 2019-10-28 DIAGNOSIS — Z7982 Long term (current) use of aspirin: Secondary | ICD-10-CM | POA: Insufficient documentation

## 2019-10-28 DIAGNOSIS — Z95810 Presence of automatic (implantable) cardiac defibrillator: Secondary | ICD-10-CM | POA: Insufficient documentation

## 2019-10-28 DIAGNOSIS — I2510 Atherosclerotic heart disease of native coronary artery without angina pectoris: Secondary | ICD-10-CM | POA: Insufficient documentation

## 2019-10-28 DIAGNOSIS — J449 Chronic obstructive pulmonary disease, unspecified: Secondary | ICD-10-CM | POA: Diagnosis not present

## 2019-10-28 DIAGNOSIS — Z8249 Family history of ischemic heart disease and other diseases of the circulatory system: Secondary | ICD-10-CM | POA: Insufficient documentation

## 2019-10-28 DIAGNOSIS — L905 Scar conditions and fibrosis of skin: Secondary | ICD-10-CM | POA: Insufficient documentation

## 2019-10-28 DIAGNOSIS — M1990 Unspecified osteoarthritis, unspecified site: Secondary | ICD-10-CM | POA: Insufficient documentation

## 2019-10-28 DIAGNOSIS — J90 Pleural effusion, not elsewhere classified: Secondary | ICD-10-CM | POA: Diagnosis not present

## 2019-10-28 DIAGNOSIS — F419 Anxiety disorder, unspecified: Secondary | ICD-10-CM | POA: Insufficient documentation

## 2019-10-28 DIAGNOSIS — Z87891 Personal history of nicotine dependence: Secondary | ICD-10-CM | POA: Insufficient documentation

## 2019-10-28 DIAGNOSIS — F329 Major depressive disorder, single episode, unspecified: Secondary | ICD-10-CM | POA: Diagnosis not present

## 2019-10-28 DIAGNOSIS — I252 Old myocardial infarction: Secondary | ICD-10-CM | POA: Diagnosis not present

## 2019-10-28 LAB — CBC
HCT: 35.5 % — ABNORMAL LOW (ref 36.0–46.0)
Hemoglobin: 11.4 g/dL — ABNORMAL LOW (ref 12.0–15.0)
MCH: 29.5 pg (ref 26.0–34.0)
MCHC: 32.1 g/dL (ref 30.0–36.0)
MCV: 91.7 fL (ref 80.0–100.0)
Platelets: 340 10*3/uL (ref 150–400)
RBC: 3.87 MIL/uL (ref 3.87–5.11)
RDW: 13.4 % (ref 11.5–15.5)
WBC: 6.5 10*3/uL (ref 4.0–10.5)
nRBC: 0 % (ref 0.0–0.2)

## 2019-10-28 LAB — BASIC METABOLIC PANEL
Anion gap: 10 (ref 5–15)
BUN: 11 mg/dL (ref 8–23)
CO2: 26 mmol/L (ref 22–32)
Calcium: 8.9 mg/dL (ref 8.9–10.3)
Chloride: 101 mmol/L (ref 98–111)
Creatinine, Ser: 0.85 mg/dL (ref 0.44–1.00)
GFR calc Af Amer: >60 mL/min (ref >60)
GFR calc non Af Amer: >60 mL/min (ref >60)
Glucose, Bld: 102 mg/dL — ABNORMAL HIGH (ref 70–99)
Potassium: 4.1 mmol/L (ref 3.5–5.1)
Sodium: 137 mmol/L (ref 135–145)

## 2019-10-28 LAB — BRAIN NATRIURETIC PEPTIDE: B Natriuretic Peptide: 107.8 pg/mL — ABNORMAL HIGH (ref 0.0–100.0)

## 2019-10-28 MED ORDER — ENTRESTO 49-51 MG PO TABS: 1.0000 | ORAL_TABLET | Freq: Two times a day (BID) | ORAL | 6 refills | Status: DC

## 2019-10-28 NOTE — Progress Notes (Signed)
[facility] Note   ID:  Anjanette, Gilkey 10-21-1945, MRN [id number]  Location: Home  Provider location: [address], [place] Type of Visit: Established patient  PCP:  Coy Saunas, MD  Cardiologist:  Glori Bickers, MD Primary HF: Dr Haroldine Laws  Chief Complaint: chronic systolic heart failure, CAD   History of Present Illness:  Latoya Adams is a 74 y.o. female with history of CAD s/ CABG x 4 0/02/6282, chronic systolic CHF (EF 66-29% 09/7652) cMRI EF 24% with only small apex scar, s/p MDT ICD, tobacco use, COPD, hx of R pleural effusion with previous pleurex catheter, and chronic anemia.   She presents for routine f/u.  Since we last saw her she has had her left hip replaced in 9/20 by Dr. Ranae Palms. Feels good. She is getting around much better. 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 No CP, SOB, or PND. Not very active. Mild pedal edema when sitting. Taking all meds.    Past Medical History:  Diagnosis Date  . AICD (automatic cardioverter/defibrillator) present 09/20/2017  . Alcohol abuse   . Anxiety   . Arthritis   . CHF (congestive heart failure) ([place])   . Coronary artery disease   . Depression   . Dyspnea    due to Pulmonary Effusion  . History of blood transfusion 2018   during CABG  . Hypertension   . Myocardial infarction [facility]) 12/2016   Past Surgical History:  Procedure Laterality Date  . CHEST TUBE INSERTION Right 03/06/2017   Procedure: INSERTION PLEURAL DRAINAGE CATHETER;  Surgeon: Ivin Poot, MD;  Location: [facility];  Service: Thoracic;  Laterality: Right;  . COLONOSCOPY    . CORONARY ARTERY BYPASS GRAFT N/A 12/08/2016   Procedure: CORONARY ARTERY BYPASS GRAFTING (CABG)x4 using left internal mammary artery and bilateral greater saphenous veins harvested endoscopically;  Surgeon: Ivin Poot, MD;  Location: [place];  Service: Open Heart Surgery;  Laterality: N/A;  . dental implants     . ICD IMPLANT  09/20/2017  . ICD IMPLANT N/A 09/20/2017   Procedure: ICD IMPLANT;  Surgeon: Constance Haw, MD;  Location: [place] [facility];  Service: Cardiovascular;  Laterality: N/A;  . IR THORACENTESIS ASP PLEURAL SPACE W/IMG GUIDE  01/24/2017  . LEFT HEART CATH AND CORONARY ANGIOGRAPHY N/A 12/03/2016   Procedure: Left Heart Cath and Coronary Angiography;  Surgeon: Sherren Mocha, MD;  Location: [place] [facility];  Service: Cardiovascular;  Laterality: N/A;  . REMOVAL OF PLEURAL DRAINAGE CATHETER Right 06/12/2017   Procedure: REMOVAL OF PLEURAL DRAINAGE CATHETER;  Surgeon: Ivin Poot, MD;  Location: [facility] [place];  Service: Thoracic;  Laterality: Right;  . RIGHT HEART CATH N/A 12/05/2016   Procedure: Right Heart Cath;  Surgeon: Jolaine Artist, MD;  Location: [place] [facility];  Service: Cardiovascular;  Laterality: N/A;  . TEE WITHOUT CARDIOVERSION N/A 12/08/2016   Procedure: TRANSESOPHAGEAL ECHOCARDIOGRAM (TEE);  Surgeon: Prescott Gum, Collier Salina, MD;  Location: [place];  Service: Open Heart Surgery;  Laterality: N/A;  . TOTAL HIP ARTHROPLASTY Left 03/04/2019   Procedure: LEFT TOTAL HIP ARTHROPLASTY ANTERIOR APPROACH;  Surgeon: Mcarthur Rossetti, MD;  Location: [facility];  Service: Orthopedics;  Laterality: Left;  . TUBAL LIGATION       Current Outpatient Medications  Medication Sig Dispense Refill  . aspirin 81 MG chewable tablet Chew 81 mg by mouth daily.    Marland Kitchen atorvastatin (LIPITOR) 80  MG tablet Take 80 mg by mouth daily.    Marland Kitchen buPROPion (WELLBUTRIN XL) 300 MG 24 hr tablet Take 300 mg by mouth daily.    . carvedilol (COREG) 12.5 MG tablet TAKE 1 TABLET TWICE DAILY 180 tablet 3  . digoxin (LANOXIN) 0.125 MG tablet Take 1 tablet (125 mcg total) by mouth daily. NEED APPOINTMENT IN OFFICE 90 tablet 0  . Ergocalciferol (VITAMIN D2) 10 MCG (400 UNIT) TABS Take 400 Units by mouth 2 (two) times daily.    . furosemide (LASIX) 40 MG tablet TAKE 1 TABLET EVERY MONDAY, WEDNESDAY, AND FRIDAY. CAN TAKE  EXTRA 1 TABLET (40 MG) IF NEEDED FOR FLUID 45 tablet 2  . Potassium 99 MG TABS Take 1 tablet by mouth 3 (three) times a week. M-W-F    . sacubitril-valsartan (ENTRESTO) 24-26 MG Take 1 tablet by mouth 2 (two) times daily. 180 tablet 3  . sertraline (ZOLOFT) 100 MG tablet Take 150 mg by mouth daily.     Marland Kitchen spironolactone (ALDACTONE) 25 MG tablet Take 25 mg by mouth daily.     No current facility-administered medications for this encounter.    Allergies:   Codeine   Social History:  The patient  reports that she quit smoking about 2 years ago. Her smoking use included cigarettes. She quit after 50.00 years of use. She has never used smokeless tobacco. She reports that she does not drink alcohol or use drugs.   Family History:  [name] family history includes CAD in her sister; Heart block in her father, mother, and sister.   ROS:  Please see the history of present illness.   All other systems are personally reviewed and negative.   Vitals:   10/28/19 1445  BP: 134/62  Pulse: 88  SpO2: 95%  Weight: 61.7 kg (136 lb)    Exam:   General: Thin appearing No resp difficulty HEENT: normal Neck: supple. no JVD. Carotids 2+ bilat; no bruits. No lymphadenopathy or thryomegaly appreciated. Cor: PMI nondisplaced. Regular rate & rhythm. No rubs, gallops or murmurs. Lungs: clear with markedly decreased BS throughout  Abdomen: soft, nontender, nondistended. No hepatosplenomegaly. No bruits or masses. Good bowel sounds. Extremities: no cyanosis, clubbing, rash, tr edema Neuro: alert & orientedx3, cranial nerves grossly intact. moves all 4 extremities w/o difficulty. Affect pleasant    Recent Labs: 12/26/2018: B Natriuretic Peptide 168.7 02/28/2019: ALT 15 03/05/2019: BUN 10; Creatinine, Ser 0.79; Potassium 3.7; Sodium 138 03/06/2019: Hemoglobin 8.3; Platelets 229  Personally reviewed   Wt Readings from Last 3 Encounters:  10/28/19 61.7 kg (136 lb)  03/04/19 61.2 kg (135 lb)  02/28/19  62.2 kg (137 lb 3.2 oz)     ASSESSMENT AND PLAN:  1. Chronic systolic HF due to ICM - Pre-op echo (7/18) EF 20-25% with normal RV - Pre-op cMRI (7/18) EF 24% with only small scar in apex. RHC with normal CO and no PAH - s/p CABG x 4. 12/08/16 - Echo 04/2017 EF 20-25%. Echo 09/2017 EF 20%, RV normal. S/p MDT ICD 09/2017. - Echo 09/2017 EF 25%, grade 1 DD, mild AI, mild MR - Echo 12/26/18 EF 30-35% Mild AI. RV ok  - Chronic NYHA II symptoms,  - Volume status ok on exam and ICD. Takes lasix 20 mg MWF. Can take extra as needed - Stop digoxin  - Continue carvedilol 12.5 mg twice a day.   - Increase Entresto to 49/51 mg BID.   - Continue spiro 25 mg daily.   - Consider Marcelline Deist  soon - ICD interrogated personally. No VT/AF. No shocks. Fluid ok. Activity level < 1hr - Encouraged her to be more active - Labs today  2. CAD - S/P CABG x4 on 12/08/16 - No s/s ischemia - Continue ASA 81 mg daily + statin.   3. Recurrent R pleural effusion - S/p Pleurex removal 06/2017. - No recurrence  4. PAD/claudication  - No claudication symptoms - ABIs 09/2017: bilateral TBI abnormal.  - Aortoiliac 10/2017: significant left common and external iliac artery disease. VVS consult recommended. She saw Dr Kirke Corin 11/06/17 and wanted to continue medical therapy for now.  5. Tobacco use/COPD - PFTs show severe COPD.  - No longer smoking  Signed, Arvilla Meres, MD  10/28/2019 2:56 PM  [facility] [address] [facility] [place] [address] [phone] (office) [phone] (fax)

## 2019-10-28 NOTE — Patient Instructions (Signed)
Increase Entresto to 49/51 mg Twice daily   STOP Digoxin  Labs done today, your results will be available in MyChart, we will contact you for abnormal readings.  Your physician recommends that you schedule a follow-up appointment in: 4 months with echocardiogram  If you have any questions or concerns before your next appointment please send [place] a message through [place] or call our office at [phone].  At the [facility], you and your health needs are our priority. As part of our continuing mission to provide you with exceptional heart care, we have created designated Provider Care Teams. These Care Teams include your primary Cardiologist (physician) and Advanced Practice Providers (APPs- Physician Assistants and Nurse Practitioners) who all work together to provide you with the care you need, when you need it.   You may see any of the following providers on your designated Care Team at your next follow up: Marland Kitchen Dr Arvilla Meres . Dr Marca Ancona . Tonye Becket, NP . Robbie Lis, PA . Karle Plumber, PharmD   Please be sure to bring in all your medications bottles to every appointment.

## 2019-10-28 NOTE — Addendum Note (Signed)
Encounter addended by: Noralee Space, RN on: 10/28/2019 3:32 PM  Actions taken: Pharmacy for encounter modified, Order list changed, Diagnosis association updated, Clinical Note Signed, Charge Capture section accepted

## 2019-12-17 ENCOUNTER — Other Ambulatory Visit: Payer: Self-pay | Admitting: Cardiology

## 2019-12-22 ENCOUNTER — Ambulatory Visit (INDEPENDENT_AMBULATORY_CARE_PROVIDER_SITE_OTHER): Payer: Medicare PPO | Admitting: *Deleted

## 2019-12-22 DIAGNOSIS — I255 Ischemic cardiomyopathy: Secondary | ICD-10-CM | POA: Diagnosis not present

## 2019-12-22 LAB — CUP PACEART REMOTE DEVICE CHECK
Battery Remaining Longevity: 113 mo
Battery Voltage: 3 V
Brady Statistic RV Percent Paced: 0 %
Date Time Interrogation Session: 20210719033325
HighPow Impedance: 69 Ohm
Implantable Lead Implant Date: 20190418
Implantable Lead Location: 753860
Implantable Pulse Generator Implant Date: 20190418
Lead Channel Impedance Value: 342 Ohm
Lead Channel Impedance Value: 418 Ohm
Lead Channel Pacing Threshold Amplitude: 1 V
Lead Channel Pacing Threshold Pulse Width: 0.4 ms
Lead Channel Sensing Intrinsic Amplitude: 17.75 mV
Lead Channel Sensing Intrinsic Amplitude: 17.75 mV
Lead Channel Setting Pacing Amplitude: 2.5 V
Lead Channel Setting Pacing Pulse Width: 0.4 ms
Lead Channel Setting Sensing Sensitivity: 0.3 mV

## 2019-12-24 NOTE — Progress Notes (Signed)
Remote ICD transmission.   

## 2020-02-02 ENCOUNTER — Telehealth (HOSPITAL_COMMUNITY): Payer: Self-pay | Admitting: *Deleted

## 2020-02-02 DIAGNOSIS — E785 Hyperlipidemia, unspecified: Secondary | ICD-10-CM

## 2020-02-02 NOTE — Telephone Encounter (Signed)
Per Karle Plumber "Ms. Latoya Adams just called and said she now has a statin allergy. Sounds like she is having a very bad rash that is currently being treated with "an oral medication and a cream" - so probably some type of steroids. She said she was supposed to call our office and let us know and ask what to do. This is a DB patient. I am not sure if he will want her to start zetia -or get a referral to lipid clinic.Her number is [phone] if you need it."  Routed to Dr.Bensimhon for advice

## 2020-02-02 NOTE — Telephone Encounter (Signed)
Please refer to lipid clinic 

## 2020-02-02 NOTE — Telephone Encounter (Signed)
Referral placed. Left vm for pt.

## 2020-02-24 NOTE — Progress Notes (Signed)
Patient ID: Latoya Adams                 DOB: 24-Sep-1945                    MRN: [id number]     HPI: Latoya Adams is a 74 y.o. female patient referred to lipid clinic by Dr [name]. PMH is significant for CAD s/p CABG x4v 12/2016, chronic HFrEF with LVEF 20-25% 09/2017 followed in CHF clinic, s/p ICD, PAD with significant left common and external iliac artery disease, HTN, tobacco use, COPD and former tobacco abuse, chronic anemia, and R pleural effusion with previous pleurex catheter. She called CHF clinic on 02/02/20 reporting rash with statin therapy that needed treatment with both oral medication and a cream. She was referred to lipid clinic to discuss other options.  Pt presents today in good spirits. She started taking atorvastatin in 2018 after her CABG. She did not have trouble tolerating her statin until about 3 months ago when she started itching and noticing a rash on her neck. She went to her PCP who thought it was dermatitis. The rash got worse so she went to another MD about a month later who thought she had scabies and gave her permethrin cream. This did not work, and after another month of progressive rash that had spread to her abdomen, back, and scalp, she went to a walk in clinic. They told her she had a statin allergy and advised her to stop her atorvastatin. They also prescribed triamcinolone cream and hydroxyzine. Pt reports that her rash resolved afterwards. She has now been off of atorvastatin for about a month (stopped 01/30/20) and has not tried other cholesterol medications in the past.  Of note, she is currently in the donut hole and her Entresto pricing has increased from $47/month to > $200.  Current Medications: none Intolerances: atorvastatin 80mg  daily - rash Risk Factors: CAD s/p CABG, PAD, HTN, former tobacco abuse, CHF, age, family history of CAD LDL goal: 55mg /dL  Diet: Avoids fast food, doesn't eat meat frequently. Likes chicken or salmon. Likes grilled  vegetables. Has been limiting pasta since her MI.   Exercise: Walks a bit but no formal exercise. Has an elliptical but had not been using due to extreme itching.  Family History: [name] family history includes CAD in her sister; Heart block in her father, mother, and sister.   Social History: The patient  reports that she quit smoking about 2 years ago. Her smoking use included cigarettes. She quit after 50.00 years of use. She has never used smokeless tobacco. She reports that she does not drink alcohol or use drugs.   Labs: 06/10/19: TC 130, TG 108, LDL 77, HDL 31, non-HDL 99 (atorvastatin 80mg  daily)  Past Medical History:  Diagnosis Date  . AICD (automatic cardioverter/defibrillator) present 09/20/2017  . Alcohol abuse   . Anxiety   . Arthritis   . CHF (congestive heart failure) (HCC)   . Coronary artery disease   . Depression   . Dyspnea    due to Pulmonary Effusion  . History of blood transfusion 2018   during CABG  . Hypertension   . Myocardial infarction [facility]) 12/2016    Current Outpatient Medications on File Prior to Visit  Medication Sig Dispense Refill  . aspirin 81 MG chewable tablet Chew 81 mg by mouth daily.    [facility] atorvastatin (LIPITOR) 80 MG tablet Take 80 mg by mouth daily.    01/2017  buPROPion (WELLBUTRIN XL) 300 MG 24 hr tablet Take 300 mg by mouth daily.    . carvedilol (COREG) 12.5 MG tablet TAKE 1 TABLET TWICE DAILY 180 tablet 3  . digoxin (LANOXIN) 0.125 MG tablet Take 1 tablet (125 mcg total) by mouth daily. NEED APPOINTMENT IN OFFICE 90 tablet 0  . Ergocalciferol (VITAMIN D2) 10 MCG (400 UNIT) TABS Take 400 Units by mouth 2 (two) times daily.    . furosemide (LASIX) 40 MG tablet TAKE 1 TABLET EVERY MONDAY, WEDNESDAY, AND FRIDAY. CAN TAKE EXTRA 1 TABLET (40 MG) IF NEEDED FOR FLUID 45 tablet 2  . Potassium 99 MG TABS Take 1 tablet by mouth 3 (three) times a week. M-W-F    . sacubitril-valsartan (ENTRESTO) 49-51 MG Take 1 tablet by mouth 2 (two) times daily.  Please cancel all previous orders for current medication. Change in dosage or pill size. 60 tablet 6  . sertraline (ZOLOFT) 100 MG tablet Take 150 mg by mouth daily.     Marland Kitchen spironolactone (ALDACTONE) 25 MG tablet TAKE 1 TABLET EVERY DAY 90 tablet 1   No current facility-administered medications on file prior to visit.    Allergies  Allergen Reactions  . Codeine Hives    Assessment/Plan:  1. Hyperlipidemia - LDL previously 77 earlier this year on atorvastatin 80mg  daily, above goal < 55mg /dL due to progressive ASCVD. She experienced a rash on atorvastatin that developed > 3 years after starting therapy. Discussed statin rechallenge, ezetimibe, and PCSK9i therapy with pt. Statin or PCSK9i therapy have better CV prevention and efficacy data. PCSK9i is currently cost prohibitive since pt is in the donut hole from taking Entresto (lipid grant is also currently closed and there are no other patient assistance options); insurance will also not cover PCSK9i therapy without a statin rechallenge. Pt is agreeable to this and will start rosuvastatin 20mg  daily. She is aware to call clinic if rash or itching returns. Will call pt in 1 month to follow up with tolerability and will schedule labs at that time if pt is tolerating rosuvastatin well.   Latoya Adams E. Latoya Adams, PharmD, BCACP, CPP [facility] [address] [place], [address] Phone: [phone]; Fax: [phone] 02/25/2020 1:51 PM

## 2020-02-25 ENCOUNTER — Other Ambulatory Visit: Payer: Self-pay

## 2020-02-25 ENCOUNTER — Ambulatory Visit (HOSPITAL_BASED_OUTPATIENT_CLINIC_OR_DEPARTMENT_OTHER)
Admission: RE | Admit: 2020-02-25 | Discharge: 2020-02-25 | Disposition: A | Discharge status: Home / Self Care | Payer: Medicare PPO | Source: Ambulatory Visit | Attending: Internal Medicine | Admitting: Internal Medicine

## 2020-02-25 ENCOUNTER — Telehealth (HOSPITAL_COMMUNITY): Payer: Self-pay | Admitting: Pharmacy Technician

## 2020-02-25 ENCOUNTER — Encounter (HOSPITAL_COMMUNITY): Payer: Self-pay | Admitting: Internal Medicine

## 2020-02-25 ENCOUNTER — Ambulatory Visit (HOSPITAL_COMMUNITY)
Admission: RE | Admit: 2020-02-25 | Discharge: 2020-02-25 | Disposition: A | Discharge status: Home / Self Care | Payer: Medicare PPO | Source: Ambulatory Visit | Attending: Internal Medicine | Admitting: Internal Medicine

## 2020-02-25 ENCOUNTER — Ambulatory Visit (INDEPENDENT_AMBULATORY_CARE_PROVIDER_SITE_OTHER): Payer: Medicare PPO | Admitting: Pharmacist

## 2020-02-25 VITALS — BP 102/50 | HR 74 | Ht 68.0 in | Wt 139.0 lb

## 2020-02-25 DIAGNOSIS — I2510 Atherosclerotic heart disease of native coronary artery without angina pectoris: Secondary | ICD-10-CM

## 2020-02-25 DIAGNOSIS — Z87891 Personal history of nicotine dependence: Secondary | ICD-10-CM | POA: Insufficient documentation

## 2020-02-25 DIAGNOSIS — E782 Mixed hyperlipidemia: Secondary | ICD-10-CM | POA: Diagnosis not present

## 2020-02-25 DIAGNOSIS — Z951 Presence of aortocoronary bypass graft: Secondary | ICD-10-CM | POA: Insufficient documentation

## 2020-02-25 DIAGNOSIS — I739 Peripheral vascular disease, unspecified: Secondary | ICD-10-CM | POA: Diagnosis not present

## 2020-02-25 DIAGNOSIS — E785 Hyperlipidemia, unspecified: Secondary | ICD-10-CM | POA: Diagnosis not present

## 2020-02-25 DIAGNOSIS — Z96642 Presence of left artificial hip joint: Secondary | ICD-10-CM | POA: Diagnosis not present

## 2020-02-25 DIAGNOSIS — I5022 Chronic systolic (congestive) heart failure: Secondary | ICD-10-CM

## 2020-02-25 DIAGNOSIS — I110 Hypertensive heart disease with heart failure: Secondary | ICD-10-CM | POA: Diagnosis present

## 2020-02-25 DIAGNOSIS — Z8249 Family history of ischemic heart disease and other diseases of the circulatory system: Secondary | ICD-10-CM | POA: Insufficient documentation

## 2020-02-25 DIAGNOSIS — Z885 Allergy status to narcotic agent status: Secondary | ICD-10-CM | POA: Diagnosis not present

## 2020-02-25 DIAGNOSIS — Z7982 Long term (current) use of aspirin: Secondary | ICD-10-CM | POA: Diagnosis not present

## 2020-02-25 DIAGNOSIS — Z79899 Other long term (current) drug therapy: Secondary | ICD-10-CM | POA: Diagnosis not present

## 2020-02-25 DIAGNOSIS — I779 Disorder of arteries and arterioles, unspecified: Secondary | ICD-10-CM | POA: Diagnosis not present

## 2020-02-25 DIAGNOSIS — I25810 Atherosclerosis of coronary artery bypass graft(s) without angina pectoris: Secondary | ICD-10-CM | POA: Diagnosis not present

## 2020-02-25 DIAGNOSIS — M1990 Unspecified osteoarthritis, unspecified site: Secondary | ICD-10-CM | POA: Insufficient documentation

## 2020-02-25 DIAGNOSIS — J90 Pleural effusion, not elsewhere classified: Secondary | ICD-10-CM | POA: Insufficient documentation

## 2020-02-25 DIAGNOSIS — Z95810 Presence of automatic (implantable) cardiac defibrillator: Secondary | ICD-10-CM | POA: Insufficient documentation

## 2020-02-25 DIAGNOSIS — D649 Anemia, unspecified: Secondary | ICD-10-CM | POA: Insufficient documentation

## 2020-02-25 DIAGNOSIS — J449 Chronic obstructive pulmonary disease, unspecified: Secondary | ICD-10-CM | POA: Insufficient documentation

## 2020-02-25 DIAGNOSIS — F329 Major depressive disorder, single episode, unspecified: Secondary | ICD-10-CM | POA: Insufficient documentation

## 2020-02-25 DIAGNOSIS — F419 Anxiety disorder, unspecified: Secondary | ICD-10-CM | POA: Insufficient documentation

## 2020-02-25 DIAGNOSIS — I252 Old myocardial infarction: Secondary | ICD-10-CM | POA: Diagnosis not present

## 2020-02-25 LAB — LIPID PANEL
Cholesterol: 222 mg/dL — ABNORMAL HIGH (ref 0–200)
HDL: 33 mg/dL — ABNORMAL LOW (ref >40)
LDL Cholesterol: 166 mg/dL — ABNORMAL HIGH (ref 0–99)
Total CHOL/HDL Ratio: 6.7 RATIO
Triglycerides: 113 mg/dL (ref <150)
VLDL: 23 mg/dL (ref 0–40)

## 2020-02-25 LAB — CBC
HCT: 34.6 % — ABNORMAL LOW (ref 36.0–46.0)
Hemoglobin: 11 g/dL — ABNORMAL LOW (ref 12.0–15.0)
MCH: 28.9 pg (ref 26.0–34.0)
MCHC: 31.8 g/dL (ref 30.0–36.0)
MCV: 90.8 fL (ref 80.0–100.0)
Platelets: 353 10*3/uL (ref 150–400)
RBC: 3.81 MIL/uL — ABNORMAL LOW (ref 3.87–5.11)
RDW: 14.2 % (ref 11.5–15.5)
WBC: 6.7 10*3/uL (ref 4.0–10.5)
nRBC: 0 % (ref 0.0–0.2)

## 2020-02-25 LAB — COMPREHENSIVE METABOLIC PANEL
ALT: 16 U/L (ref 0–44)
AST: 18 U/L (ref 15–41)
Albumin: 3.7 g/dL (ref 3.5–5.0)
Alkaline Phosphatase: 79 U/L (ref 38–126)
Anion gap: 11 (ref 5–15)
BUN: 13 mg/dL (ref 8–23)
CO2: 24 mmol/L (ref 22–32)
Calcium: 8.8 mg/dL — ABNORMAL LOW (ref 8.9–10.3)
Chloride: 104 mmol/L (ref 98–111)
Creatinine, Ser: 0.98 mg/dL (ref 0.44–1.00)
GFR calc Af Amer: >60 mL/min (ref >60)
GFR calc non Af Amer: 58 mL/min — ABNORMAL LOW (ref >60)
Glucose, Bld: 107 mg/dL — ABNORMAL HIGH (ref 70–99)
Potassium: 4.3 mmol/L (ref 3.5–5.1)
Sodium: 139 mmol/L (ref 135–145)
Total Bilirubin: 0.6 mg/dL (ref 0.3–1.2)
Total Protein: 6.9 g/dL (ref 6.5–8.1)

## 2020-02-25 LAB — BRAIN NATRIURETIC PEPTIDE: B Natriuretic Peptide: 185.3 pg/mL — ABNORMAL HIGH (ref 0.0–100.0)

## 2020-02-25 LAB — ECHOCARDIOGRAM COMPLETE
Area-P 1/2: 3.42 cm2
S' Lateral: 3.1 cm

## 2020-02-25 MED ORDER — ROSUVASTATIN CALCIUM 20 MG PO TABS
20.0000 mg | ORAL_TABLET | Freq: Every day | ORAL | 11 refills | Status: DC
Start: 2020-02-25 — End: 2020-03-25

## 2020-02-25 MED ORDER — DAPAGLIFLOZIN PROPANEDIOL 10 MG PO TABS
10.0000 mg | ORAL_TABLET | Freq: Every day | ORAL | 6 refills | Status: DC
Start: 2020-02-25 — End: 2020-08-06

## 2020-02-25 NOTE — Telephone Encounter (Signed)
Patient was seen in clinic and started on Farxiga. Her current 30 day co-pay is $140. I think she is in the coverage gap. Called and left a message to see if her co-pay is affordable. If not, I have started an application for [place] assistance.  Will follow up.

## 2020-02-25 NOTE — Progress Notes (Signed)
Advanced Heart Failure Clinic Note   ID:  Latoya Adams, Latoya Adams August 11, 1945, MRN [id number]  Location: Home  Provider location: [address] [place] Type of Visit: Established patient  PCP:  Carmin Richmond, MD  Cardiologist:  Arvilla Meres, MD Primary HF: Dr [name]  Chief Complaint: chronic systolic heart failure, CAD   History of Present Illness:  Latoya Adams is a 74 y.o. female with history of CAD s/ CABG x 4 12/08/2016, chronic systolic CHF (EF 34-74% 09/2017) cMRI EF 24% with only small apex scar, s/p MDT ICD, tobacco use, COPD, hx of R pleural effusion with previous pleurex catheter, and chronic anemia.   She presents for routine f/u. Says she is feeling much better after coming off atorvastatin. Now about to start Crestor. Has been followed in Lipid Clinic and considering PCSK-9i. Says she feels pretty good. Not very active. Able to do ADLs without problem. Walked up a hill with her son and got very SOB. No CP. + mild DOE. No edema.   ICD interrogated in person. No VT/AF. Fluid up to threshold. Activity 1hr/day Personally reviewed    Past Medical History:  Diagnosis Date  . AICD (automatic cardioverter/defibrillator) present 09/20/2017  . Alcohol abuse   . Anxiety   . Arthritis   . CHF (congestive heart failure) (HCC)   . Coronary artery disease   . Depression   . Dyspnea    due to Pulmonary Effusion  . History of blood transfusion 2018   during CABG  . Hypertension   . Myocardial infarction [facility]) 12/2016   Past Surgical History:  Procedure Laterality Date  . CHEST TUBE INSERTION Right 03/06/2017   Procedure: INSERTION PLEURAL DRAINAGE CATHETER;  Surgeon: Kerin Perna, MD;  Location[facility] OR;  Service: Thoracic;  Laterality: Right;  . COLONOSCOPY    . CORONARY ARTERY BYPASS GRAFT N/A 12/08/2016   Procedure: CORONARY ARTERY BYPASS GRAFTING (CABG)x4 using left internal mammary artery and bilateral greater saphenous veins harvested endoscopically;   Surgeon: Kerin Perna, MD;  Location: [facility] OR;  Service: Open Heart Surgery;  Laterality: N/A;  . dental implants    . ICD IMPLANT  09/20/2017  . ICD IMPLANT N/A 09/20/2017   Procedure: ICD IMPLANT;  Surgeon: Regan Lemming, MD;  Location: [facility] INVASIVE CV LAB;  Service: Cardiovascular;  Laterality: N/A;  . IR THORACENTESIS ASP PLEURAL SPACE W/IMG GUIDE  01/24/2017  . LEFT HEART CATH AND CORONARY ANGIOGRAPHY N/A 12/03/2016   Procedure: Left Heart Cath and Coronary Angiography;  Surgeon: Tonny Bollman, MD;  Location: [facility] INVASIVE CV LAB;  Service: Cardiovascular;  Laterality: N/A;  . REMOVAL OF PLEURAL DRAINAGE CATHETER Right 06/12/2017   Procedure: REMOVAL OF PLEURAL DRAINAGE CATHETER;  Surgeon: Kerin Perna, MD;  Location: [facility] OR;  Service: Thoracic;  Laterality: Right;  . RIGHT HEART CATH N/A 12/05/2016   Procedure: Right Heart Cath;  Surgeon: Dolores Patty, MD;  Location: [facility] INVASIVE CV LAB;  Service: Cardiovascular;  Laterality: N/A;  . TEE WITHOUT CARDIOVERSION N/A 12/08/2016   Procedure: TRANSESOPHAGEAL ECHOCARDIOGRAM (TEE);  Surgeon: Donata Clay, Theron Arista, MD;  Location: [facility] OR;  Service: Open Heart Surgery;  Laterality: N/A;  . TOTAL HIP ARTHROPLASTY Left 03/04/2019   Procedure: LEFT TOTAL HIP ARTHROPLASTY ANTERIOR APPROACH;  Surgeon: Kathryne Hitch, MD;  Location: [facility] OR;  Service: Orthopedics;  Laterality: Left;  . TUBAL LIGATION       Current Outpatient Medications  Medication Sig Dispense Refill  .  aspirin 81 MG chewable tablet Chew 81 mg by mouth daily.    Marland Kitchen buPROPion (WELLBUTRIN XL) 300 MG 24 hr tablet Take 300 mg by mouth daily.    . carvedilol (COREG) 12.5 MG tablet TAKE 1 TABLET TWICE DAILY 180 tablet 3  . Ergocalciferol (VITAMIN D2) 10 MCG (400 UNIT) TABS Take 400 Units by mouth 2 (two) times daily.    . furosemide (LASIX) 40 MG tablet TAKE 1 TABLET EVERY MONDAY, WEDNESDAY, AND FRIDAY. CAN TAKE EXTRA 1 TABLET (40 MG) IF NEEDED FOR FLUID 45 tablet 2  . Potassium 99  MG TABS Take 1 tablet by mouth 3 (three) times a week. M-W-F    . rosuvastatin (CRESTOR) 20 MG tablet Take 1 tablet (20 mg total) by mouth daily. 30 tablet 11  . sacubitril-valsartan (ENTRESTO) 49-51 MG Take 1 tablet by mouth 2 (two) times daily. Please cancel all previous orders for current medication. Change in dosage or pill size. 60 tablet 6  . sertraline (ZOLOFT) 100 MG tablet Take 150 mg by mouth daily.     Marland Kitchen spironolactone (ALDACTONE) 25 MG tablet TAKE 1 TABLET EVERY DAY 90 tablet 1  . digoxin (LANOXIN) 0.125 MG tablet Take 1 tablet (125 mcg total) by mouth daily. NEED APPOINTMENT IN OFFICE (Patient not taking: Reported on 02/25/2020) 90 tablet 0   No current facility-administered medications for this encounter.    Allergies:   Codeine and Atorvastatin   Social History:  The patient  reports that she quit smoking about 3 years ago. Her smoking use included cigarettes. She quit after 50.00 years of use. She has never used smokeless tobacco. She reports that she does not drink alcohol and does not use drugs.   Family History:  [name] family history includes CAD in her sister; Heart block in her father, mother, and sister.   ROS:  Please see the history of present illness.   All other systems are personally reviewed and negative.   Vitals:   02/25/20 1452  BP: (!) 102/50  Pulse: 74  SpO2: 96%  Weight: 63 kg (139 lb)  Height: 5\' 8"  (1.727 m)    Exam:   General:  Thin. No resp difficulty HEENT: normal Neck: supple. no JVD. Carotids 2+ bilat; no bruits. No lymphadenopathy or thryomegaly appreciated. Cor: PMI nondisplaced. Regular rate & rhythm. No rubs, gallops or murmurs. Lungs: clear markedly decreased throughout Abdomen: soft, nontender, nondistended. No hepatosplenomegaly. No bruits or masses. Good bowel sounds. Extremities: no cyanosis, clubbing, rash, edema Neuro: alert & orientedx3, cranial nerves grossly intact. moves all 4 extremities w/o difficulty. Affect  pleasant   Recent Labs: 02/28/2019: ALT 15 10/28/2019: B Natriuretic Peptide 107.8; BUN 11; Creatinine, Ser 0.85; Hemoglobin 11.4; Platelets 340; Potassium 4.1; Sodium 137  Personally reviewed   Wt Readings from Last 3 Encounters:  02/25/20 63 kg (139 lb)  10/28/19 61.7 kg (136 lb)  03/04/19 61.2 kg (135 lb)     ASSESSMENT AND PLAN:  1. Chronic systolic HF due to ICM - Pre-op echo (7/18) EF 20-25% with normal RV - Pre-op cMRI (7/18) EF 24% with only small scar in apex. RHC with normal CO and no PAH - s/p CABG x 4. 12/08/16 - Echo 04/2017 EF 20-25%. Echo 09/2017 EF 20%, RV normal. S/p MDT ICD 09/2017. - Echo 09/2017 EF 25%, grade 1 DD, mild AI, mild MR - Echo 12/26/18 EF 30-35% Mild AI. RV ok  - Echo today 02/25/20 EF 30-35% RV ok Personally reviewed - Chronic NYHA II-early  III symptoms,  - Volume status looks good on exam but mildly elevated on Optivol - On lasix MWF will stop. Start Farxiga 10  - Continue carvedilol 12.5 mg twice a day.   - Continue Entresto to 49/51 mg BID.   - Continue spiro 25 mg daily.   - ICD interrogated in person. No VT/AF. Fluid up to threshold. Activity 1hr/day Personally reviewed - Encouraged her to be more active - Labs today  2. CAD - S/P CABG x4 on 12/08/16 - No s/s ischemia - Continue ASA 81 mg daily + statin.  - referred to lipid clinic. Atorva switched to crestor and considering PCSK9i (I agree completely)   3. Recurrent R pleural effusion - S/p Pleurex removal 06/2017. - No recurrence  4. PAD/claudication  - No claudication symptoms - ABIs 09/2017: bilateral TBI abnormal.  - Aortoiliac 10/2017: significant left common and external iliac artery disease. VVS consult recommended. She saw Dr Kirke Corin 11/06/17 and wanted to continue medical therapy for now.  5. Tobacco use/COPD - PFTs show severe COPD.  - No longer smoking  Signed, Arvilla Meres, MD  02/25/2020 3:04 PM  [facility] [address] [facility]  [place] [address] [phone] (office) [phone] (fax)

## 2020-02-25 NOTE — Progress Notes (Signed)
  Echocardiogram 2D Echocardiogram has been performed.  Celene Skeen 02/25/2020, 2:44 PM

## 2020-02-25 NOTE — Addendum Note (Signed)
Encounter addended by: Samara Snide, RN on: 02/25/2020 3:33 PM  Actions taken: Diagnosis association updated, Medication long-term status modified, Charge Capture section accepted, Pharmacy for encounter modified, Order list changed, Clinical Note Signed

## 2020-02-25 NOTE — Patient Instructions (Addendum)
It was nice to meet you today!  Your LDL goal is < 55  Start taking rosuvastatin (Crestor) 20mg  once daily for your cholesterol  Call if you develop a rash #[phone]  If you tolerate rosuvastatin well, we would plan to recheck your cholesterol in 2-3 months

## 2020-02-25 NOTE — Patient Instructions (Addendum)
STOP Lasix  START Farxiga 10mg  Daily  Your provider has prescribed for you. Please be aware the most common side effect of this medication is urinary tract infections and yeast infections. Please practice good hygiene and keep this area clean and dry to help prevent this. If you do begin to have symptoms of these infections, such as difficulty urinating or painful urination,  please let Marcelline Deist know.   Labs done today, your results will be available in MyChart, we will contact you for abnormal readings.  Your physician recommends that you return for your follow up visit in 6 months  If you have any questions or concerns before your next appointment please send [place] a message through [place] or call our office at [phone].    TO LEAVE A MESSAGE FOR THE NURSE SELECT OPTION 2, PLEASE LEAVE A MESSAGE INCLUDING: . YOUR NAME . DATE OF BIRTH . CALL BACK NUMBER . REASON FOR CALL**this is important as we prioritize the call backs  YOU WILL RECEIVE A CALL BACK THE SAME DAY AS LONG AS YOU CALL BEFORE 4:00 PM  At the [facility], you and your health needs are our priority. As part of our continuing mission to provide you with exceptional heart care, we have created designated Provider Care Teams. These Care Teams include your primary Cardiologist (physician) and Advanced Practice Providers (APPs- Physician Assistants and Nurse Practitioners) who all work together to provide you with the care you need, when you need it.   You may see any of the following providers on your designated Care Team at your next follow up: [phone] Dr Marland Kitchen . Dr Arvilla Meres . Marca Ancona, NP . Tonye Becket, PA . Robbie Lis, PharmD   Please be sure to bring in all your medications bottles to every appointment.

## 2020-03-22 ENCOUNTER — Ambulatory Visit (INDEPENDENT_AMBULATORY_CARE_PROVIDER_SITE_OTHER): Payer: Medicare PPO

## 2020-03-22 DIAGNOSIS — I255 Ischemic cardiomyopathy: Secondary | ICD-10-CM | POA: Diagnosis not present

## 2020-03-22 DIAGNOSIS — I5022 Chronic systolic (congestive) heart failure: Secondary | ICD-10-CM

## 2020-03-23 LAB — CUP PACEART REMOTE DEVICE CHECK
Battery Remaining Longevity: 110 mo
Battery Voltage: 2.99 V
Brady Statistic RV Percent Paced: 0 %
Date Time Interrogation Session: 20211018012505
HighPow Impedance: 72 Ohm
Implantable Lead Implant Date: 20190418
Implantable Lead Location: 753860
Implantable Pulse Generator Implant Date: 20190418
Lead Channel Impedance Value: 342 Ohm
Lead Channel Impedance Value: 399 Ohm
Lead Channel Pacing Threshold Amplitude: 0.875 V
Lead Channel Pacing Threshold Pulse Width: 0.4 ms
Lead Channel Sensing Intrinsic Amplitude: 18.25 mV
Lead Channel Sensing Intrinsic Amplitude: 18.25 mV
Lead Channel Setting Pacing Amplitude: 2.5 V
Lead Channel Setting Pacing Pulse Width: 0.4 ms
Lead Channel Setting Sensing Sensitivity: 0.3 mV

## 2020-03-24 ENCOUNTER — Telehealth: Payer: Self-pay | Admitting: Pharmacist

## 2020-03-24 DIAGNOSIS — I2510 Atherosclerotic heart disease of native coronary artery without angina pectoris: Secondary | ICD-10-CM

## 2020-03-24 NOTE — Telephone Encounter (Addendum)
Called pt to follow up with rosuvastatin tolerability. Pt reports tolerating rosuvastatin well and denies any sign of rash like she previously experienced on atorvastatin.  She mentioned PCSK9i again as this was discussed after lipid visit at her CHF visit with Dr [name]. Discussed with pt that her insurance would not cover PCSK9i without 2nd statin rechallenge as well as PCKS9i currently being cost prohibitive for her at $140 per month. Discussed that statin therapy remains first line for lipid lowering management and that since she is tolerating rosuvastatin well, we prefer to continue her on this.  Since pt is tolerating her rosuvastatin well on rechallenge, will go ahead and increase her dose to 40mg  daily. Pt is in agreement with this plan. Scheduled follow up labs in 6 weeks. If LDL remains above goal, can consider addition of ezetimibe or PCKS9i therapy.

## 2020-03-25 MED ORDER — ROSUVASTATIN CALCIUM 40 MG PO TABS: 40.0000 mg | ORAL_TABLET | Freq: Every day | ORAL | 3 refills | Status: DC

## 2020-03-25 NOTE — Progress Notes (Signed)
Remote ICD transmission.   

## 2020-03-25 NOTE — Telephone Encounter (Signed)
Emailed the patient AZ&Me application.  Will fax in once signatures are obtained.

## 2020-03-25 NOTE — Addendum Note (Signed)
Addended by: Macey Wurtz E on: 03/25/2020 04:03 PM   Modules accepted: Orders

## 2020-04-01 NOTE — Telephone Encounter (Signed)
Attempted to call patient as I have yet to receive her application. Her voicemail was full.  Will be here to assist in the future as needed.  Archer Asa, CPhT

## 2020-05-05 ENCOUNTER — Other Ambulatory Visit: Payer: Medicare PPO

## 2020-05-05 ENCOUNTER — Other Ambulatory Visit: Payer: Self-pay

## 2020-05-05 DIAGNOSIS — I2510 Atherosclerotic heart disease of native coronary artery without angina pectoris: Secondary | ICD-10-CM

## 2020-05-05 LAB — HEPATIC FUNCTION PANEL
ALT: 11 IU/L (ref 0–32)
AST: 15 IU/L (ref 0–40)
Albumin: 4.4 g/dL (ref 3.7–4.7)
Alkaline Phosphatase: 106 IU/L (ref 44–121)
Bilirubin Total: 0.2 mg/dL (ref 0.0–1.2)
Bilirubin, Direct: <0.1 mg/dL (ref 0.00–0.40)
Total Protein: 6.7 g/dL (ref 6.0–8.5)

## 2020-05-05 LAB — LIPID PANEL
Chol/HDL Ratio: 3.3 ratio (ref 0.0–4.4)
Cholesterol, Total: 139 mg/dL (ref 100–199)
HDL: 42 mg/dL (ref >39)
LDL Chol Calc (NIH): 79 mg/dL (ref 0–99)
Triglycerides: 97 mg/dL (ref 0–149)
VLDL Cholesterol Cal: 18 mg/dL (ref 5–40)

## 2020-05-06 ENCOUNTER — Telehealth: Payer: Self-pay | Admitting: Pharmacist

## 2020-05-06 ENCOUNTER — Telehealth (HOSPITAL_COMMUNITY): Payer: Self-pay | Admitting: Pharmacy Technician

## 2020-05-06 MED ORDER — REPATHA SURECLICK 140 MG/ML ~~LOC~~ SOAJ
1.0000 | SUBCUTANEOUS | 11 refills | Status: DC
Start: 2020-05-06 — End: 2021-04-27

## 2020-05-06 NOTE — Telephone Encounter (Addendum)
Prior auth for Repatha approved through 11/01/20, rx sent to pharmacy, copay is $125 (donut hole pricing since she takes other branded meds). Pt is ok with this pricing.  Pt states her medication list is wrong as she stopped digoxin on 10/28/19 visit. I do see this noted in visit note but digoxin is still on pt's med list - this has been removed.  Pt also states she never received email about Farxiga patient assistance that was sent a few months ago - will forward to CHF team for follow up with this.  Still awaiting final confirmation on Entresto however I advised pt that she likely needs to resume her Entresto.

## 2020-05-06 NOTE — Telephone Encounter (Signed)
I was forwarded a message stating that the patient did not receive her application for [place] assistance. The application was emailed to the patient on 10/21.  Spoke with the patient, sent application via e-mail, as requested.

## 2020-05-06 NOTE — Telephone Encounter (Signed)
Pt is tolerating rosuvastatin 40mg  daily without issue (previously experienced a rash on atorvastatin). LDL decently well controlled at 79, however would target aggressive LDL goal < 55 given her extensive ASCVD. Discussed adding Repatha which pt is agreeable with. Will send authorization and follow up with pt once approved.  Pt also states she stopped taking her Entresto at her last visit with Dr 02/25/20 when she started 02/27/20. Advised her that she should have continued on Entresto and just stopped her Lasix when she started [place]. She wished for me to forward this to MD for confirmation.

## 2020-05-07 ENCOUNTER — Telehealth (HOSPITAL_COMMUNITY): Payer: Self-pay | Admitting: *Deleted

## 2020-05-07 MED ORDER — ENTRESTO 24-26 MG PO TABS
1.0000 | ORAL_TABLET | Freq: Two times a day (BID) | ORAL | 3 refills | Status: DC
Start: 2020-05-07 — End: 2020-10-06

## 2020-05-07 NOTE — Telephone Encounter (Signed)
Received message from [place], [place] D that pt stopped her Entresto back in Sept when she was started on Farxiga and wanted [place] to f/u with her.  I called pt to f/u and she states she thought that the Farxiga replaced the [facility] and that is why she stopped taking. Advised that the Marcelline Deist was in addition to [facility] and that she was supposed to stop the Lasix, which she states she did stop. She does not check her BP at home, reports feeling good with no complaints. She is agreeable to restart Entresto as been off for over 2 months will have her restart at 24/26 mg BID for now, which she states she has at home and will start today, rx also sent in.  Information sent to Dr [name] for review.

## 2020-06-21 ENCOUNTER — Ambulatory Visit (INDEPENDENT_AMBULATORY_CARE_PROVIDER_SITE_OTHER): Payer: Medicare PPO

## 2020-06-21 DIAGNOSIS — I255 Ischemic cardiomyopathy: Secondary | ICD-10-CM

## 2020-06-22 LAB — CUP PACEART REMOTE DEVICE CHECK
Battery Remaining Longevity: 106 mo
Battery Voltage: 2.99 V
Brady Statistic RV Percent Paced: 0.01 %
Date Time Interrogation Session: 20220117022724
HighPow Impedance: 77 Ohm
Implantable Lead Implant Date: 20190418
Implantable Lead Location: 753860
Implantable Pulse Generator Implant Date: 20190418
Lead Channel Impedance Value: 342 Ohm
Lead Channel Impedance Value: 418 Ohm
Lead Channel Pacing Threshold Amplitude: 1 V
Lead Channel Pacing Threshold Pulse Width: 0.4 ms
Lead Channel Sensing Intrinsic Amplitude: 20.875 mV
Lead Channel Sensing Intrinsic Amplitude: 20.875 mV
Lead Channel Setting Pacing Amplitude: 2.5 V
Lead Channel Setting Pacing Pulse Width: 0.4 ms
Lead Channel Setting Sensing Sensitivity: 0.3 mV

## 2020-06-29 ENCOUNTER — Telehealth: Payer: Self-pay | Admitting: Pharmacist

## 2020-06-29 DIAGNOSIS — E782 Mixed hyperlipidemia: Secondary | ICD-10-CM

## 2020-06-29 NOTE — Telephone Encounter (Signed)
Contacted patient regarding approval of [employer] patient assistance program. Copay information provided to [employer] and confirmed copay cost of $0. Pt aware.  Assistance approved until 05/29/21.

## 2020-07-03 ENCOUNTER — Other Ambulatory Visit: Payer: Self-pay | Admitting: Internal Medicine

## 2020-07-03 NOTE — Progress Notes (Signed)
Remote ICD transmission.   

## 2020-08-06 ENCOUNTER — Other Ambulatory Visit (HOSPITAL_COMMUNITY): Payer: Self-pay | Admitting: *Deleted

## 2020-08-06 MED ORDER — DAPAGLIFLOZIN PROPANEDIOL 10 MG PO TABS
10.0000 mg | ORAL_TABLET | Freq: Every day | ORAL | 3 refills | Status: DC
Start: 2020-08-06 — End: 2021-10-20

## 2020-08-18 ENCOUNTER — Other Ambulatory Visit (HOSPITAL_COMMUNITY): Payer: Self-pay

## 2020-08-18 MED ORDER — SPIRONOLACTONE 25 MG PO TABS: 25.0000 mg | ORAL_TABLET | Freq: Every day | ORAL | 1 refills | Status: DC

## 2020-08-25 ENCOUNTER — Ambulatory Visit (HOSPITAL_COMMUNITY)
Admission: RE | Admit: 2020-08-25 | Discharge: 2020-08-25 | Disposition: A | Discharge status: Home / Self Care | Payer: Medicare PPO | Source: Ambulatory Visit | Attending: Internal Medicine | Admitting: Internal Medicine

## 2020-08-25 ENCOUNTER — Encounter (HOSPITAL_COMMUNITY): Payer: Self-pay | Admitting: Internal Medicine

## 2020-08-25 ENCOUNTER — Other Ambulatory Visit: Payer: Self-pay

## 2020-08-25 VITALS — BP 118/60 | HR 72 | Wt 137.6 lb

## 2020-08-25 DIAGNOSIS — E782 Mixed hyperlipidemia: Secondary | ICD-10-CM | POA: Diagnosis not present

## 2020-08-25 DIAGNOSIS — J449 Chronic obstructive pulmonary disease, unspecified: Secondary | ICD-10-CM | POA: Insufficient documentation

## 2020-08-25 DIAGNOSIS — Z951 Presence of aortocoronary bypass graft: Secondary | ICD-10-CM | POA: Insufficient documentation

## 2020-08-25 DIAGNOSIS — Z885 Allergy status to narcotic agent status: Secondary | ICD-10-CM | POA: Diagnosis not present

## 2020-08-25 DIAGNOSIS — I779 Disorder of arteries and arterioles, unspecified: Secondary | ICD-10-CM | POA: Insufficient documentation

## 2020-08-25 DIAGNOSIS — Z7982 Long term (current) use of aspirin: Secondary | ICD-10-CM | POA: Insufficient documentation

## 2020-08-25 DIAGNOSIS — Z79899 Other long term (current) drug therapy: Secondary | ICD-10-CM | POA: Diagnosis not present

## 2020-08-25 DIAGNOSIS — I2510 Atherosclerotic heart disease of native coronary artery without angina pectoris: Secondary | ICD-10-CM | POA: Insufficient documentation

## 2020-08-25 DIAGNOSIS — Z87891 Personal history of nicotine dependence: Secondary | ICD-10-CM | POA: Diagnosis not present

## 2020-08-25 DIAGNOSIS — I739 Peripheral vascular disease, unspecified: Secondary | ICD-10-CM | POA: Diagnosis not present

## 2020-08-25 DIAGNOSIS — I5022 Chronic systolic (congestive) heart failure: Secondary | ICD-10-CM | POA: Diagnosis not present

## 2020-08-25 DIAGNOSIS — Z8249 Family history of ischemic heart disease and other diseases of the circulatory system: Secondary | ICD-10-CM | POA: Insufficient documentation

## 2020-08-25 DIAGNOSIS — Z7984 Long term (current) use of oral hypoglycemic drugs: Secondary | ICD-10-CM | POA: Diagnosis not present

## 2020-08-25 DIAGNOSIS — Z95810 Presence of automatic (implantable) cardiac defibrillator: Secondary | ICD-10-CM | POA: Insufficient documentation

## 2020-08-25 DIAGNOSIS — I110 Hypertensive heart disease with heart failure: Secondary | ICD-10-CM | POA: Insufficient documentation

## 2020-08-25 DIAGNOSIS — Z452 Encounter for adjustment and management of vascular access device: Secondary | ICD-10-CM | POA: Diagnosis not present

## 2020-08-25 DIAGNOSIS — Z7182 Exercise counseling: Secondary | ICD-10-CM | POA: Insufficient documentation

## 2020-08-25 LAB — LIPID PANEL
Cholesterol: 74 mg/dL (ref 0–200)
HDL: 36 mg/dL — ABNORMAL LOW (ref >40)
LDL Cholesterol: 27 mg/dL (ref 0–99)
Total CHOL/HDL Ratio: 2.1 RATIO
Triglycerides: 56 mg/dL (ref <150)
VLDL: 11 mg/dL (ref 0–40)

## 2020-08-25 LAB — COMPREHENSIVE METABOLIC PANEL
ALT: 16 U/L (ref 0–44)
AST: 21 U/L (ref 15–41)
Albumin: 3.7 g/dL (ref 3.5–5.0)
Alkaline Phosphatase: 69 U/L (ref 38–126)
Anion gap: 7 (ref 5–15)
BUN: 14 mg/dL (ref 8–23)
CO2: 24 mmol/L (ref 22–32)
Calcium: 8.9 mg/dL (ref 8.9–10.3)
Chloride: 107 mmol/L (ref 98–111)
Creatinine, Ser: 0.81 mg/dL (ref 0.44–1.00)
GFR, Estimated: >60 mL/min (ref >60)
Glucose, Bld: 101 mg/dL — ABNORMAL HIGH (ref 70–99)
Potassium: 4.2 mmol/L (ref 3.5–5.1)
Sodium: 138 mmol/L (ref 135–145)
Total Bilirubin: 0.5 mg/dL (ref 0.3–1.2)
Total Protein: 6.5 g/dL (ref 6.5–8.1)

## 2020-08-25 LAB — BRAIN NATRIURETIC PEPTIDE: B Natriuretic Peptide: 312.5 pg/mL — ABNORMAL HIGH (ref 0.0–100.0)

## 2020-08-25 NOTE — Progress Notes (Signed)
[facility] Note   ID:  Timberlee, Roblero March 02, 1946, MRN [id number]  Location: Home  Provider location: [address], [place] Type of Visit: Established patient  PCP:  Carmin Richmond, MD  Cardiologist:  Arvilla Meres, MD Primary HF: Dr [name]  Chief Complaint: chronic systolic heart failure, CAD   History of Present Illness:  Latoya Adams is a 75 y.o. female with history of CAD s/ CABG x 4 12/08/2016, chronic systolic CHF (EF 97-02% 09/2017) cMRI EF 24% with only small apex scar, s/p MDT ICD, tobacco use, COPD, hx of R pleural effusion with previous pleurex catheter, and chronic anemia.   She presents for routine f/u. Says she is feeling much better after coming off atorvastatin. Now on Crestor and Repatha. Able to do ADLS without problem. Playing with grandkids. No CP, edema, orthopnea or PND. No dizziness.   ICD interrogated in person. No VT/AF. Volume looks good. Activity ~ 1hr/day Personally reviewed    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   Alcohol abuse    Anxiety    Arthritis    CHF (congestive heart failure) (HCC)    Coronary artery disease    Depression    Dyspnea    due to Pulmonary Effusion   History of blood transfusion 2018   during CABG   Hypertension    Myocardial infarction (HCC) 12/2016   Past Surgical History:  Procedure Laterality Date   CHEST TUBE INSERTION Right 03/06/2017   Procedure: INSERTION PLEURAL DRAINAGE CATHETER;  Surgeon: Kerin Perna, MD;  Location: [facility] OR;  Service: Thoracic;  Laterality: Right;   COLONOSCOPY     CORONARY ARTERY BYPASS GRAFT N/A 12/08/2016   Procedure: CORONARY ARTERY BYPASS GRAFTING (CABG)x4 using left internal mammary artery and bilateral greater saphenous veins harvested endoscopically;  Surgeon: Kerin Perna, MD;  Location: [facility] OR;  Service: Open Heart Surgery;  Laterality: N/A;   dental  implants     ICD IMPLANT  09/20/2017   ICD IMPLANT N/A 09/20/2017   Procedure: ICD IMPLANT;  Surgeon: Regan Lemming, MD;  Location: [facility] INVASIVE CV LAB;  Service: Cardiovascular;  Laterality: N/A;   IR THORACENTESIS ASP PLEURAL SPACE W/IMG GUIDE  01/24/2017   LEFT HEART CATH AND CORONARY ANGIOGRAPHY N/A 12/03/2016   Procedure: Left Heart Cath and Coronary Angiography;  Surgeon: Tonny Bollman, MD;  Location: [facility] INVASIVE CV LAB;  Service: Cardiovascular;  Laterality: N/A;   REMOVAL OF PLEURAL DRAINAGE CATHETER Right 06/12/2017   Procedure: REMOVAL OF PLEURAL DRAINAGE CATHETER;  Surgeon: Kerin Perna, MD;  Location: [facility] OR;  Service: Thoracic;  Laterality: Right;   RIGHT HEART CATH N/A 12/05/2016   Procedure: Right Heart Cath;  Surgeon: Dolores Patty, MD;  Location: [facility] INVASIVE CV LAB;  Service: Cardiovascular;  Laterality: N/A;   TEE WITHOUT CARDIOVERSION N/A 12/08/2016   Procedure: TRANSESOPHAGEAL ECHOCARDIOGRAM (TEE);  Surgeon: Donata Clay, Theron Arista, MD;  Location: [facility] OR;  Service: Open Heart Surgery;  Laterality: N/A;   TOTAL HIP ARTHROPLASTY Left 03/04/2019   Procedure: LEFT TOTAL HIP ARTHROPLASTY ANTERIOR APPROACH;  Surgeon: Kathryne Hitch, MD;  Location: [facility] OR;  Service: Orthopedics;  Laterality: Left;   TUBAL LIGATION       Current Outpatient Medications  Medication Sig Dispense Refill   aspirin 81 MG chewable tablet Chew 81 mg by mouth daily.     buPROPion (WELLBUTRIN XL) 300 MG 24 hr tablet Take  300 mg by mouth daily.     carvedilol (COREG) 12.5 MG tablet TAKE 1 TABLET TWICE DAILY 180 tablet 3   dapagliflozin propanediol (FARXIGA) 10 MG TABS tablet Take 1 tablet (10 mg total) by mouth daily before breakfast. 90 tablet 3   Ergocalciferol (VITAMIN D2) 10 MCG (400 UNIT) TABS Take 400 Units by mouth 2 (two) times daily.     Evolocumab (REPATHA SURECLICK) 140 MG/ML SOAJ Inject 1 pen into the skin every 14 (fourteen) days. 2 mL 11   Multiple Vitamins-Minerals  (CENTRUM WOMEN PO) Take 1 tablet by mouth daily.     rosuvastatin (CRESTOR) 40 MG tablet Take 1 tablet (40 mg total) by mouth daily. 90 tablet 3   sacubitril-valsartan (ENTRESTO) 24-26 MG Take 1 tablet by mouth 2 (two) times daily. 60 tablet 3   sertraline (ZOLOFT) 100 MG tablet Take 150 mg by mouth daily.      spironolactone (ALDACTONE) 25 MG tablet Take 1 tablet (25 mg total) by mouth daily. 90 tablet 1   No current facility-administered medications for this encounter.    Allergies:   Codeine and Atorvastatin   Social History:  The patient  reports that she quit smoking about 3 years ago. Her smoking use included cigarettes. She quit after 50.00 years of use. She has never used smokeless tobacco. She reports that she does not drink alcohol and does not use drugs.   Family History:  [name] family history includes CAD in her sister; Heart block in her father, mother, and sister.   ROS:  Please see the history of present illness.   All other systems are personally reviewed and negative.   Vitals:   08/25/20 1407  BP: 118/60  Pulse: 72  SpO2: 97%  Weight: 62.4 kg (137 lb 9.6 oz)    Exam:   General:  Well appearing. No resp difficulty HEENT: normal Neck: supple. no JVD. Carotids 2+ bilat; no bruits. No lymphadenopathy or thryomegaly appreciated. Cor: PMI nondisplaced. Regular rate & rhythm. No rubs, gallops or murmurs. Lungs: clear but decreased throughout Abdomen: soft, nontender, nondistended. No hepatosplenomegaly. No bruits or masses. Good bowel sounds. Extremities: no cyanosis, clubbing, rash, edema Neuro: alert & orientedx3, cranial nerves grossly intact. moves all 4 extremities w/o difficulty. Affect pleasant  Recent Labs: 02/25/2020: B Natriuretic Peptide 185.3; BUN 13; Creatinine, Ser 0.98; Hemoglobin 11.0; Platelets 353; Potassium 4.3; Sodium 139 05/05/2020: ALT 11  Personally reviewed   Wt Readings from Last 3 Encounters:  08/25/20 62.4 kg (137 lb 9.6 oz)   02/25/20 63 kg (139 lb)  10/28/19 61.7 kg (136 lb)     ASSESSMENT AND PLAN:  1. Chronic systolic HF due to ICM - Pre-op echo (7/18) EF 20-25% with normal RV - Pre-op cMRI (7/18) EF 24% with only small scar in apex. RHC with normal CO and no PAH - s/p CABG x 4. 12/08/16 - Echo 04/2017 EF 20-25%. Echo 09/2017 EF 20%, RV normal. S/p MDT ICD 09/2017. - Echo 09/2017 EF 25%, grade 1 DD, mild AI, mild MR - Echo 12/26/18 EF 30-35% Mild AI. RV ok  - Echo 02/25/20 EF 30-35% RV ok Personally reviewed - Stable NYHA II-early III symptoms,  - Volume status looks good - Continue Farxiga - Continue carvedilol 12.5 mg twice a day.   - Continue Entresto 49/51 mg BID.   - Continue spiro 25 mg daily.   - ICD interrogated in person. No VT/AF. Volume looks good. Activity ~ 1hr/day Personally reviewed - Encouraged her to  be more active - Labs today  2. CAD - S/P CABG x4 on 12/08/16 - No s/s ischemia - Continue ASA 81 mg daily + statin.  - Continue Crestor and Repatha  3. Recurrent R pleural effusion - S/p Pleurex removal 06/2017. - No recurrence  4. PAD/claudication  - No claudication - ABIs 09/2017: bilateral TBI abnormal.  - Aortoiliac 10/2017: significant left common and external iliac artery disease. VVS consult recommended. She saw Dr Kirke Corin 11/06/17 and wanted to continue medical therapy for now.  5. Tobacco use/COPD - PFTs show severe COPD.  - No longer smoking  Signed, Arvilla Meres, MD  08/25/2020 2:25 PM  [facility] [address] [facility] [place] [address] [phone] (office) [phone] (fax)

## 2020-08-25 NOTE — Patient Instructions (Signed)
Labs done today. We will contact you only if your labs are abnormal.  No medication changes were made. Please continue all current medications as prescribed.  Your physician recommends that you schedule a follow-up appointment in: 6 months. Please contact our office in August to schedule a September appointment.   If you have any questions or concerns before your next appointment please send us a message through mychart or call our office at [phone].    TO LEAVE A MESSAGE FOR THE NURSE SELECT OPTION 2, PLEASE LEAVE A MESSAGE INCLUDING: . YOUR NAME . DATE OF BIRTH . CALL BACK NUMBER . REASON FOR CALL**this is important as we prioritize the call backs  YOU WILL RECEIVE A CALL BACK THE SAME DAY AS LONG AS YOU CALL BEFORE 4:00 PM   Do the following things EVERYDAY: 1) Weigh yourself in the morning before breakfast. Write it down and keep it in a log. 2) Take your medicines as prescribed 3) Eat low salt foods--Limit salt (sodium) to 2000 mg per day.  4) Stay as active as you can everyday 5) Limit all fluids for the day to less than 2 liters   At the [facility], you and your health needs are our priority. As part of our continuing mission to provide you with exceptional heart care, we have created designated Provider Care Teams. These Care Teams include your primary Cardiologist (physician) and Advanced Practice Providers (APPs- Physician Assistants and Nurse Practitioners) who all work together to provide you with the care you need, when you need it.   You may see any of the following providers on your designated Care Team at your next follow up: . Dr Daniel Bensimhon . Dr Dalton McLean . [name], NP . Brittainy Simmons, PA . Lauren Kemp, PharmD   Please be sure to bring in all your medications bottles to every appointment.   

## 2020-08-25 NOTE — Addendum Note (Signed)
Encounter addended by: Chinita Pester, CMA on: 08/25/2020 2:36 PM  Actions taken: Order list changed, Diagnosis association updated, Charge Capture section accepted, Clinical Note Signed

## 2020-09-20 ENCOUNTER — Ambulatory Visit (INDEPENDENT_AMBULATORY_CARE_PROVIDER_SITE_OTHER): Payer: Medicare PPO

## 2020-09-20 DIAGNOSIS — I255 Ischemic cardiomyopathy: Secondary | ICD-10-CM | POA: Diagnosis not present

## 2020-09-21 LAB — CUP PACEART REMOTE DEVICE CHECK
Battery Remaining Longevity: 103 mo
Battery Voltage: 2.97 V
Brady Statistic RV Percent Paced: 0.01 %
Date Time Interrogation Session: 20220418073826
HighPow Impedance: 71 Ohm
Implantable Lead Implant Date: 20190418
Implantable Lead Location: 753860
Implantable Pulse Generator Implant Date: 20190418
Lead Channel Impedance Value: 342 Ohm
Lead Channel Impedance Value: 399 Ohm
Lead Channel Pacing Threshold Amplitude: 0.75 V
Lead Channel Pacing Threshold Pulse Width: 0.4 ms
Lead Channel Sensing Intrinsic Amplitude: 20.875 mV
Lead Channel Sensing Intrinsic Amplitude: 20.875 mV
Lead Channel Setting Pacing Amplitude: 2.5 V
Lead Channel Setting Pacing Pulse Width: 0.4 ms
Lead Channel Setting Sensing Sensitivity: 0.3 mV

## 2020-10-02 ENCOUNTER — Other Ambulatory Visit (HOSPITAL_COMMUNITY): Payer: Self-pay | Admitting: Internal Medicine

## 2020-10-06 ENCOUNTER — Other Ambulatory Visit (HOSPITAL_COMMUNITY): Payer: Self-pay | Admitting: Internal Medicine

## 2020-10-06 NOTE — Progress Notes (Signed)
Remote ICD transmission.   

## 2020-10-25 ENCOUNTER — Other Ambulatory Visit (HOSPITAL_COMMUNITY): Payer: Self-pay | Admitting: *Deleted

## 2020-10-25 MED ORDER — ENTRESTO 49-51 MG PO TABS: 1.0000 | ORAL_TABLET | Freq: Two times a day (BID) | ORAL | 6 refills | Status: DC

## 2020-11-16 ENCOUNTER — Other Ambulatory Visit (HOSPITAL_COMMUNITY): Payer: Self-pay | Admitting: Internal Medicine

## 2020-12-20 ENCOUNTER — Ambulatory Visit (INDEPENDENT_AMBULATORY_CARE_PROVIDER_SITE_OTHER): Payer: Medicare PPO

## 2020-12-20 DIAGNOSIS — I255 Ischemic cardiomyopathy: Secondary | ICD-10-CM | POA: Diagnosis not present

## 2020-12-21 LAB — CUP PACEART REMOTE DEVICE CHECK
Battery Remaining Longevity: 100 mo
Battery Voltage: 2.98 V
Brady Statistic RV Percent Paced: 0.01 %
Date Time Interrogation Session: 20220718043724
HighPow Impedance: 77 Ohm
Implantable Lead Implant Date: 20190418
Implantable Lead Location: 753860
Implantable Pulse Generator Implant Date: 20190418
Lead Channel Impedance Value: 361 Ohm
Lead Channel Impedance Value: 456 Ohm
Lead Channel Pacing Threshold Amplitude: 0.875 V
Lead Channel Pacing Threshold Pulse Width: 0.4 ms
Lead Channel Sensing Intrinsic Amplitude: 17.875 mV
Lead Channel Sensing Intrinsic Amplitude: 17.875 mV
Lead Channel Setting Pacing Amplitude: 2.5 V
Lead Channel Setting Pacing Pulse Width: 0.4 ms
Lead Channel Setting Sensing Sensitivity: 0.3 mV

## 2021-01-11 NOTE — Progress Notes (Signed)
Remote ICD transmission.   

## 2021-01-24 ENCOUNTER — Telehealth: Payer: Self-pay | Admitting: Orthopaedic Surgery

## 2021-01-24 NOTE — Telephone Encounter (Signed)
Pt called stated she fall and was seen in  [facility] for fractured left wrist. Pt is asking to be seen today but can't come in by 10 am to be seen by PA Clark. She has to find a ride. She states her discharge papers states she need to be seen today or tomorrow. She is asking to be seen later today by PA Chestine Spore or Dr. Magnus Ivan or another doctor available today. Please call pt at [phone].

## 2021-01-25 ENCOUNTER — Ambulatory Visit (INDEPENDENT_AMBULATORY_CARE_PROVIDER_SITE_OTHER): Payer: Medicare PPO

## 2021-01-25 ENCOUNTER — Encounter: Payer: Self-pay | Admitting: Orthopaedic Surgery

## 2021-01-25 ENCOUNTER — Ambulatory Visit: Payer: Medicare PPO | Admitting: Orthopaedic Surgery

## 2021-01-25 DIAGNOSIS — M25532 Pain in left wrist: Secondary | ICD-10-CM

## 2021-01-25 DIAGNOSIS — S62102D Fracture of unspecified carpal bone, left wrist, subsequent encounter for fracture with routine healing: Secondary | ICD-10-CM

## 2021-01-25 MED ORDER — TRAMADOL HCL 50 MG PO TABS: 50.0000 mg | ORAL_TABLET | Freq: Four times a day (QID) | ORAL | 0 refills | Status: DC | PRN

## 2021-01-25 NOTE — Progress Notes (Signed)
Office Visit Note   Patient: Latoya Adams           Date of Birth: [birth year]           MRN: [id number] Visit Date: 01/25/2021              Requested by: Carmin Richmond, MD No address on file PCP: Carmin Richmond, MD   Assessment & Plan: Visit Diagnoses:  1. Pain in left wrist   2. Closed fracture of left wrist with routine healing, subsequent encounter     Plan: She is placed in a molded sugar-tong splint.  She is given a sling for comfort.  Elevation wiggling fingers encouraged.  We will see her back in 2 weeks at time obtain 3 views of the left wrist.  Questions encouraged and answered at length by Dr. Magnus Ivan and myself.  We will try to treat with conservative and she is agreeable with this.  Follow-Up Instructions: Return in about 2 weeks (around 02/08/2021) for Radiographs.   Orders:  Orders Placed This Encounter  Procedures   XR Wrist Complete Left   Meds ordered this encounter  Medications   traMADol (ULTRAM) 50 MG tablet    Sig: Take 1 tablet (50 mg total) by mouth every 6 (six) hours as needed.    Dispense:  30 tablet    Refill:  0       Procedures: No procedures performed   Clinical Data: No additional findings.   Subjective: Chief Complaint  Patient presents with   Left Wrist - New Patient (Initial Visit)    HPI Latoya Adams is well-known to Dr. Magnus Ivan service comes in today after sustaining a left wrist fracture on 01/21/2021.  She tripped and fell seen in the [facility] ER and was diagnosed with Colles' fracture left wrist placed appropriately in a sugar-tong splint.  She comes in today for follow-up care.  She sustained no other injuries.  She states she has mostly been taking Tylenol for pain. Review of Systems See HPI    Objective: Vital Signs: There were no vitals taken for this visit.  Physical Exam Constitutional:      Appearance: She is normal weight. She is not ill-appearing or diaphoretic.  Cardiovascular:     Pulses: Normal pulses.   Pulmonary:     Effort: Pulmonary effort is normal.  Neurological:     Mental Status: She is alert and oriented to person, place, and time.  Psychiatric:        Mood and Affect: Mood normal.    Ortho Exam Left hand full sensation full motor.  She has significant ecchymosis about the left wrist tickly on the dorsal aspect.  No gross deformity.  There is no impending ulcers rashes.  Lesions.  Radial pulses 2+. Specialty Comments:  No specialty comments available.  Imaging: XR Wrist Complete Left  Result Date: 01/25/2021 Left wrist 3 views: Shows a distal radius fracture consistent with Colles' type fracture with dorsal angulation of the distal radius.  No intra-articular involvement.  No apparent ulnar involvement.  No other fractures identified.    PMFS History: Patient Active Problem List   Diagnosis Date Noted   Hyperlipidemia 02/25/2020   Status post total replacement of left hip 03/04/2019   Unilateral primary osteoarthritis, left hip 01/01/2019   Ischemic cardiomyopathy 09/20/2017   Chronic systolic CHF (congestive heart failure) (HCC) 01/08/2017   Tobacco use 01/08/2017   S/P CABG x 4 12/08/2016   Centrilobular emphysema (HCC) 12/06/2016  CAD in native artery 12/06/2016   NSTEMI (non-ST elevated myocardial infarction) (HCC) 12/03/2016   Hypertension, essential 12/03/2016   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   Alcohol abuse    Anxiety    Arthritis    CHF (congestive heart failure) (HCC)    Coronary artery disease    Depression    Dyspnea    due to Pulmonary Effusion   History of blood transfusion 2018   during CABG   Hypertension    Myocardial infarction (HCC) 12/2016    Family History  Problem Relation Age of Onset   Heart block Mother    Heart block Father    CAD Sister    Heart block Sister    Heart attack Neg Hx     Past Surgical History:  Procedure Laterality Date   CHEST TUBE  INSERTION Right 03/06/2017   Procedure: INSERTION PLEURAL DRAINAGE CATHETER;  Surgeon: Kerin Perna, MD;  Location: [facility] OR;  Service: Thoracic;  Laterality: Right;   COLONOSCOPY     CORONARY ARTERY BYPASS GRAFT N/A 12/08/2016   Procedure: CORONARY ARTERY BYPASS GRAFTING (CABG)x4 using left internal mammary artery and bilateral greater saphenous veins harvested endoscopically;  Surgeon: Kerin Perna, MD;  Location: [facility] OR;  Service: Open Heart Surgery;  Laterality: N/A;   dental implants     ICD IMPLANT  09/20/2017   ICD IMPLANT N/A 09/20/2017   Procedure: ICD IMPLANT;  Surgeon: Regan Lemming, MD;  Location: [facility] INVASIVE CV LAB;  Service: Cardiovascular;  Laterality: N/A;   IR THORACENTESIS ASP PLEURAL SPACE W/IMG GUIDE  01/24/2017   LEFT HEART CATH AND CORONARY ANGIOGRAPHY N/A 12/03/2016   Procedure: Left Heart Cath and Coronary Angiography;  Surgeon: Tonny Bollman, MD;  Location: [facility] INVASIVE CV LAB;  Service: Cardiovascular;  Laterality: N/A;   REMOVAL OF PLEURAL DRAINAGE CATHETER Right 06/12/2017   Procedure: REMOVAL OF PLEURAL DRAINAGE CATHETER;  Surgeon: Kerin Perna, MD;  Location: [facility] OR;  Service: Thoracic;  Laterality: Right;   RIGHT HEART CATH N/A 12/05/2016   Procedure: Right Heart Cath;  Surgeon: Dolores Patty, MD;  Location: [facility] INVASIVE CV LAB;  Service: Cardiovascular;  Laterality: N/A;   TEE WITHOUT CARDIOVERSION N/A 12/08/2016   Procedure: TRANSESOPHAGEAL ECHOCARDIOGRAM (TEE);  Surgeon: Donata Clay, Theron Arista, MD;  Location: [facility] OR;  Service: Open Heart Surgery;  Laterality: N/A;   TOTAL HIP ARTHROPLASTY Left 03/04/2019   Procedure: LEFT TOTAL HIP ARTHROPLASTY ANTERIOR APPROACH;  Surgeon: Kathryne Hitch, MD;  Location: [facility] OR;  Service: Orthopedics;  Laterality: Left;   TUBAL LIGATION     Social History   Occupational History   Not on file  Tobacco Use   Smoking status: Former    Years: 50.00    Types: Cigarettes    Quit date: 12/03/2016    Years since quitting:  4.1   Smokeless tobacco: Never  Vaping Use   Vaping Use: Never used  Substance and Sexual Activity   Alcohol use: No    Comment: Recovering  Alocholic   Drug use: No   Sexual activity: Not on file

## 2021-02-08 NOTE — Progress Notes (Signed)
Electrophysiology Office Note Date: 02/09/2021  ID:  Latoya Adams, Latoya Adams 01/04/1946, MRN [id number]  PCP: Carmin Richmond, MD Primary Cardiologist: Arvilla Meres, MD Electrophysiologist: Regan Lemming, MD   CC: Routine ICD follow-up  Latoya Adams is a 75 y.o. female seen today for Will Jorja Loa, MD for routine electrophysiology followup.  Since last being seen in our clinic the patient reports doing OK overall. She has mild SOB walking around the grocery store. She is always thankful that there are carts for her to lean on. She has SOB and fatigue with more than ADL exertion..  she denies chest pain, palpitations, PND, orthopnea, nausea, vomiting, dizziness, syncope, edema, weight gain, or early satiety. He has not had ICD shocks.   Device History: Medtronic Single Chamber ICD implanted 09/2017 for CHF  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   Alcohol abuse    Anxiety    Arthritis    CHF (congestive heart failure) (HCC)    Coronary artery disease    Depression    Dyspnea    due to Pulmonary Effusion   History of blood transfusion 2018   during CABG   Hypertension    Myocardial infarction (HCC) 12/2016   Past Surgical History:  Procedure Laterality Date   CHEST TUBE INSERTION Right 03/06/2017   Procedure: INSERTION PLEURAL DRAINAGE CATHETER;  Surgeon: Kerin Perna, MD;  Location: [facility] OR;  Service: Thoracic;  Laterality: Right;   COLONOSCOPY     CORONARY ARTERY BYPASS GRAFT N/A 12/08/2016   Procedure: CORONARY ARTERY BYPASS GRAFTING (CABG)x4 using left internal mammary artery and bilateral greater saphenous veins harvested endoscopically;  Surgeon: Kerin Perna, MD;  Location: [facility] OR;  Service: Open Heart Surgery;  Laterality: N/A;   dental implants     ICD IMPLANT  09/20/2017   ICD IMPLANT N/A 09/20/2017   Procedure: ICD IMPLANT;  Surgeon: Regan Lemming, MD;  Location: [facility] INVASIVE CV  LAB;  Service: Cardiovascular;  Laterality: N/A;   IR THORACENTESIS ASP PLEURAL SPACE W/IMG GUIDE  01/24/2017   LEFT HEART CATH AND CORONARY ANGIOGRAPHY N/A 12/03/2016   Procedure: Left Heart Cath and Coronary Angiography;  Surgeon: Tonny Bollman, MD;  Location: [facility] INVASIVE CV LAB;  Service: Cardiovascular;  Laterality: N/A;   REMOVAL OF PLEURAL DRAINAGE CATHETER Right 06/12/2017   Procedure: REMOVAL OF PLEURAL DRAINAGE CATHETER;  Surgeon: Kerin Perna, MD;  Location: [facility] OR;  Service: Thoracic;  Laterality: Right;   RIGHT HEART CATH N/A 12/05/2016   Procedure: Right Heart Cath;  Surgeon: Dolores Patty, MD;  Location: [facility] INVASIVE CV LAB;  Service: Cardiovascular;  Laterality: N/A;   TEE WITHOUT CARDIOVERSION N/A 12/08/2016   Procedure: TRANSESOPHAGEAL ECHOCARDIOGRAM (TEE);  Surgeon: Donata Clay, Theron Arista, MD;  Location: [facility] OR;  Service: Open Heart Surgery;  Laterality: N/A;   TOTAL HIP ARTHROPLASTY Left 03/04/2019   Procedure: LEFT TOTAL HIP ARTHROPLASTY ANTERIOR APPROACH;  Surgeon: Kathryne Hitch, MD;  Location: [facility] OR;  Service: Orthopedics;  Laterality: Left;   TUBAL LIGATION      Current Outpatient Medications  Medication Sig Dispense Refill   aspirin 81 MG chewable tablet Chew 81 mg by mouth daily.     buPROPion (WELLBUTRIN XL) 300 MG 24 hr tablet Take 300 mg by mouth daily.     carvedilol (COREG) 12.5 MG tablet TAKE 1 TABLET TWICE DAILY 180 tablet 3   dapagliflozin propanediol (FARXIGA) 10 MG TABS tablet Take 1 tablet (  10 mg total) by mouth daily before breakfast. 90 tablet 3   Ergocalciferol (VITAMIN D2) 10 MCG (400 UNIT) TABS Take 400 Units by mouth 2 (two) times daily.     Evolocumab (REPATHA SURECLICK) 140 MG/ML SOAJ Inject 1 pen into the skin every 14 (fourteen) days. 2 mL 11   Multiple Vitamins-Minerals (CENTRUM WOMEN PO) Take 1 tablet by mouth daily.     rosuvastatin (CRESTOR) 40 MG tablet Take 1 tablet (40 mg total) by mouth daily. 90 tablet 3   sacubitril-valsartan  (ENTRESTO) 49-51 MG Take 1 tablet by mouth 2 (two) times daily. 60 tablet 6   sertraline (ZOLOFT) 100 MG tablet Take 150 mg by mouth daily.      spironolactone (ALDACTONE) 25 MG tablet Take 1 tablet (25 mg total) by mouth daily. 90 tablet 1   traMADol (ULTRAM) 50 MG tablet Take 1 tablet (50 mg total) by mouth every 6 (six) hours as needed. 30 tablet 0   No current facility-administered medications for this visit.    Allergies:   Codeine, Atorvastatin, and Varenicline tartrate   Social History: Social History   Socioeconomic History   Marital status: Single    Spouse name: Not on file   Number of children: Not on file   Years of education: Not on file   Highest education level: Not on file  Occupational History   Not on file  Tobacco Use   Smoking status: Former    Years: 50.00    Types: Cigarettes    Quit date: 12/03/2016    Years since quitting: 4.1   Smokeless tobacco: Never  Vaping Use   Vaping Use: Never used  Substance and Sexual Activity   Alcohol use: No    Comment: Recovering  Alocholic   Drug use: No   Sexual activity: Not on file  Other Topics Concern   Not on file  Social History Narrative   Not on file   Social Determinants of Health   Financial Resource Strain: Not on file  Food Insecurity: Not on file  Transportation Needs: Not on file  Physical Activity: Not on file  Stress: Not on file  Social Connections: Not on file  Intimate Partner Violence: Not on file    Family History: Family History  Problem Relation Age of Onset   Heart block Mother    Heart block Father    CAD Sister    Heart block Sister    Heart attack Neg Hx     Review of Systems: All other systems reviewed and are otherwise negative except as noted above.   Physical Exam: Vitals:   02/09/21 1104  BP: 100/68  Pulse: 68  SpO2: 97%  Weight: 132 lb 6.4 oz (60.1 kg)  Height: 5\' 7"  (1.702 m)     GEN- The patient is well appearing, alert and oriented x 3 today.   HEENT:  normocephalic, atraumatic; sclera clear, conjunctiva pink; hearing intact; oropharynx clear; neck supple, no JVP Lymph- no cervical lymphadenopathy Lungs- Clear to ausculation bilaterally, normal work of breathing.  No wheezes, rales, rhonchi Heart- Regular rate and rhythm, no murmurs, rubs or gallops, PMI not laterally displaced GI- soft, non-tender, non-distended, bowel sounds present, no hepatosplenomegaly Extremities- no clubbing or cyanosis. No edema; DP/PT/radial pulses 2+ bilaterally MS- no significant deformity or atrophy Skin- warm and dry, no rash or lesion; ICD pocket well healed Psych- euthymic mood, full affect Neuro- strength and sensation are intact  ICD interrogation- reviewed in detail today,  See PACEART  report  EKG:  EKG is not ordered today.   Recent Labs: 02/25/2020: Hemoglobin 11.0; Platelets 353 08/25/2020: ALT 16; B Natriuretic Peptide 312.5; BUN 14; Creatinine, Ser 0.81; Potassium 4.2; Sodium 138   Wt Readings from Last 3 Encounters:  02/09/21 132 lb 6.4 oz (60.1 kg)  08/25/20 137 lb 9.6 oz (62.4 kg)  02/25/20 139 lb (63 kg)     Other studies Reviewed: Additional studies/ records that were reviewed today include: Previous EP and HF notes   Assessment and Plan:  1.  Chronic systolic dysfunction s/p Medtronic single chamber ICD  euvolemic today Stable on an appropriate medical regimen Normal ICD function See Pace Art report No changes today  Latoya Adams's heart failure has failed to improve despite titration of guideline directed medication such that he qualifies for the [facility] NEO device. The following information from the patient's medical record supports the medical necessity of this procedure for my patient, consistent with the FDA on-label indication for BAROSTIM NEO:  ? LVEF of 30-35%  confirmed by Echo on 02/2020   ? NT-proBNP of <1600 pg/ml  = Labs to be drawn today, 02/09/21  ?Symptomatic despite medication management of: diuretic, beta  blocker, ACEi/ARB/ARNi, Aldosterone inhibitor, and SGLT2 inhibitor as evidenced by symptoms below.   ? This patients signs and symptoms of heart failure include "dyspnea with mild to moderate exertion, edema, and fatigue   ? NYHA Congestive Heart Failure Classification: III  ? Recent hospitalization for Heart Failure on (not applicable for this patient)   This patient is NOT indicated for cardiac resynchronization therapy because QRS = 94 by EKG on 08/2020   Labs/ tests ordered today include:  Orders Placed This Encounter  Procedures   Basic metabolic panel   Pro b natriuretic peptide (BNP)    Disposition:   Follow up with Dr. Elberta Fortis or EP APP in 12 months. Sooner with issues or if considered for BAT.   Dustin Flock, PA-C  02/09/2021 11:33 AM  [facility] [address] [place] [address] [phone] (office) [phone] (fax)

## 2021-02-09 ENCOUNTER — Encounter: Payer: Self-pay | Admitting: Student

## 2021-02-09 ENCOUNTER — Ambulatory Visit (INDEPENDENT_AMBULATORY_CARE_PROVIDER_SITE_OTHER): Payer: Medicare PPO

## 2021-02-09 ENCOUNTER — Ambulatory Visit: Payer: Medicare PPO | Admitting: Orthopaedic Surgery

## 2021-02-09 ENCOUNTER — Other Ambulatory Visit: Payer: Self-pay

## 2021-02-09 ENCOUNTER — Ambulatory Visit: Payer: Medicare PPO | Admitting: Student

## 2021-02-09 ENCOUNTER — Encounter: Payer: Self-pay | Admitting: Orthopaedic Surgery

## 2021-02-09 VITALS — BP 100/68 | HR 68 | Ht 67.0 in | Wt 132.4 lb

## 2021-02-09 DIAGNOSIS — I2510 Atherosclerotic heart disease of native coronary artery without angina pectoris: Secondary | ICD-10-CM | POA: Diagnosis not present

## 2021-02-09 DIAGNOSIS — S62102D Fracture of unspecified carpal bone, left wrist, subsequent encounter for fracture with routine healing: Secondary | ICD-10-CM

## 2021-02-09 DIAGNOSIS — I5022 Chronic systolic (congestive) heart failure: Secondary | ICD-10-CM | POA: Diagnosis not present

## 2021-02-09 DIAGNOSIS — I255 Ischemic cardiomyopathy: Secondary | ICD-10-CM

## 2021-02-09 LAB — CUP PACEART INCLINIC DEVICE CHECK
Battery Remaining Longevity: 96 mo
Battery Voltage: 3 V
Brady Statistic RV Percent Paced: 0.01 %
Date Time Interrogation Session: 20220907113651
HighPow Impedance: 89 Ohm
Implantable Lead Implant Date: 20190418
Implantable Lead Location: 753860
Implantable Pulse Generator Implant Date: 20190418
Lead Channel Impedance Value: 399 Ohm
Lead Channel Impedance Value: 475 Ohm
Lead Channel Pacing Threshold Amplitude: 0.75 V
Lead Channel Pacing Threshold Pulse Width: 0.4 ms
Lead Channel Sensing Intrinsic Amplitude: 19 mV
Lead Channel Sensing Intrinsic Amplitude: 21.375 mV
Lead Channel Setting Pacing Amplitude: 2.5 V
Lead Channel Setting Pacing Pulse Width: 0.4 ms
Lead Channel Setting Sensing Sensitivity: 0.3 mV

## 2021-02-09 NOTE — Patient Instructions (Signed)
Medication Instructions:  Your physician recommends that you continue on your current medications as directed. Please refer to the Current Medication list given to you today.  *If you need a refill on your cardiac medications before your next appointment, please call your pharmacy*   Lab Work: TODAY: BMET, BNP  If you have labs (blood work) drawn today and your tests are completely normal, you will receive your results only by: MyChart Message (if you have MyChart) OR A paper copy in the mail If you have any lab test that is abnormal or we need to change your treatment, we will call you to review the results.   Follow-Up: At [facility], you and your health needs are our priority.  As part of our continuing mission to provide you with exceptional heart care, we have created designated Provider Care Teams.  These Care Teams include your primary Cardiologist (physician) and Advanced Practice Providers (APPs -  Physician Assistants and Nurse Practitioners) who all work together to provide you with the care you need, when you need it.   Your next appointment:   1 year(s)  The format for your next appointment:   In Person  Provider:   Casimiro Needle "Otilio Saber, PA-C

## 2021-02-09 NOTE — Progress Notes (Signed)
The patient is a 75 year old female who is being seen in follow-up almost 3 weeks after a mechanical fall in which she sustained a left extra-articular distal radius fracture.  She has been in a sugar-tong splint with plaster.  She does report soreness of the elbow and at the wrist out of the splint especially with x-rays.  She denies any numbness and tingling in her hand.  She is able to move her fingers and thumb on the left side and her hand is well-perfused.  There is bruising to be expected around the wrist and slight dorsal angulation of the fracture which is seen clinically and on x-ray showing an extra-articular fracture.  Given the unstable nature of this fracture we will least place her in a short arm cast today and have this on for the next 3 weeks.  In 3 weeks and I will have that cast removed and repeat AP and lateral of the left wrist.  Hopefully at that point we can place her in a removable wrist splint.

## 2021-02-10 LAB — BASIC METABOLIC PANEL
BUN/Creatinine Ratio: 15 (ref 12–28)
BUN: 14 mg/dL (ref 8–27)
CO2: 23 mmol/L (ref 20–29)
Calcium: 9.1 mg/dL (ref 8.7–10.3)
Chloride: 100 mmol/L (ref 96–106)
Creatinine, Ser: 0.92 mg/dL (ref 0.57–1.00)
Glucose: 104 mg/dL — ABNORMAL HIGH (ref 65–99)
Potassium: 4.7 mmol/L (ref 3.5–5.2)
Sodium: 137 mmol/L (ref 134–144)
eGFR: 66 mL/min/{1.73_m2} (ref >59)

## 2021-02-10 LAB — PRO B NATRIURETIC PEPTIDE: NT-Pro BNP: 1386 pg/mL — ABNORMAL HIGH (ref 0–301)

## 2021-02-11 ENCOUNTER — Telehealth (HOSPITAL_COMMUNITY): Payer: Self-pay | Admitting: Family Medicine

## 2021-02-14 ENCOUNTER — Other Ambulatory Visit (HOSPITAL_COMMUNITY): Payer: Self-pay | Admitting: Internal Medicine

## 2021-02-23 ENCOUNTER — Encounter (HOSPITAL_COMMUNITY): Payer: Medicare PPO

## 2021-03-03 ENCOUNTER — Encounter: Payer: Self-pay | Admitting: *Deleted

## 2021-03-03 DIAGNOSIS — Z006 Encounter for examination for normal comparison and control in clinical research program: Secondary | ICD-10-CM

## 2021-03-03 NOTE — Research (Signed)
Spoke with patient about Barostim [employer]) research study.  Review the screening process with her and answered her questions. She wants to speak with Bensimhon about the study first.  I also sent her an e-mail about the study with an informed consent attached to it.

## 2021-03-15 ENCOUNTER — Encounter (HOSPITAL_COMMUNITY): Payer: Self-pay

## 2021-03-16 ENCOUNTER — Ambulatory Visit: Payer: Self-pay

## 2021-03-16 ENCOUNTER — Other Ambulatory Visit: Payer: Self-pay

## 2021-03-16 ENCOUNTER — Ambulatory Visit: Payer: Medicare PPO | Admitting: Orthopaedic Surgery

## 2021-03-16 DIAGNOSIS — S62102D Fracture of unspecified carpal bone, left wrist, subsequent encounter for fracture with routine healing: Secondary | ICD-10-CM | POA: Diagnosis not present

## 2021-03-16 NOTE — Progress Notes (Signed)
HPI: [name] returns today almost 2 months status post left extra-articular distal radius fracture.  Is been treated conservatively in a short arm cast.  She overall is doing well states she still has some pain in the wrist but it is taking nothing for the pain at this point time.  Physical exam: Left hand she is able to do a thumbs she can wiggle her fingers.  She has minimal dorsal and volar flexion of the wrist.  There is no rashes skin lesions impending ulcers or skin breakdown.  Radiographs 2 views left wrist: Interval healing with some early callus formation.  Dorsal angulation remains unchanged.  Overall position alignment remains unchanged.  Impression: Left distal radius fracture extra-articular  Plan: We will change her over into a removable Velcro splint she is to treat this is a cast.  She can come out of it just for hand hygiene.  Wiggling fingers gentle range of motion of the wrist supination pronation forearm all encouraged.  We will see her back in just 1 month and obtain 2 views of her left wrist at that time.  Questions were encouraged and answered

## 2021-03-21 ENCOUNTER — Ambulatory Visit (INDEPENDENT_AMBULATORY_CARE_PROVIDER_SITE_OTHER): Payer: Medicare PPO

## 2021-03-21 DIAGNOSIS — I255 Ischemic cardiomyopathy: Secondary | ICD-10-CM

## 2021-03-22 LAB — CUP PACEART REMOTE DEVICE CHECK
Battery Remaining Longevity: 96 mo
Battery Voltage: 2.97 V
Brady Statistic RV Percent Paced: 0 %
Date Time Interrogation Session: 20221017001804
HighPow Impedance: 77 Ohm
Implantable Lead Implant Date: 20190418
Implantable Lead Location: 753860
Implantable Pulse Generator Implant Date: 20190418
Lead Channel Impedance Value: 361 Ohm
Lead Channel Impedance Value: 456 Ohm
Lead Channel Pacing Threshold Amplitude: 0.75 V
Lead Channel Pacing Threshold Pulse Width: 0.4 ms
Lead Channel Sensing Intrinsic Amplitude: 20.625 mV
Lead Channel Sensing Intrinsic Amplitude: 20.625 mV
Lead Channel Setting Pacing Amplitude: 2.5 V
Lead Channel Setting Pacing Pulse Width: 0.4 ms
Lead Channel Setting Sensing Sensitivity: 0.3 mV

## 2021-03-27 ENCOUNTER — Other Ambulatory Visit: Payer: Self-pay | Admitting: Internal Medicine

## 2021-03-29 NOTE — Progress Notes (Signed)
Remote ICD transmission.   

## 2021-04-02 ENCOUNTER — Other Ambulatory Visit: Payer: Self-pay | Admitting: Internal Medicine

## 2021-04-13 ENCOUNTER — Encounter: Payer: Self-pay | Admitting: Orthopaedic Surgery

## 2021-04-13 ENCOUNTER — Other Ambulatory Visit: Payer: Self-pay

## 2021-04-13 ENCOUNTER — Ambulatory Visit (INDEPENDENT_AMBULATORY_CARE_PROVIDER_SITE_OTHER): Payer: Medicare PPO

## 2021-04-13 ENCOUNTER — Ambulatory Visit: Payer: Medicare PPO | Admitting: Orthopaedic Surgery

## 2021-04-13 DIAGNOSIS — S62102D Fracture of unspecified carpal bone, left wrist, subsequent encounter for fracture with routine healing: Secondary | ICD-10-CM

## 2021-04-13 NOTE — Progress Notes (Signed)
The patient is now 12 weeks status post a left distal radius fracture.  She has been in a splint.  She indicated that she is feeling much better than she did at the time of the break.  She has been limiting the activities with her left wrist.  There is still some tenderness and swelling as well.  On exam there is still some slight swelling and pain on range of motion of the left wrist.  2 views left wrist show the fracture has completely healed of the left wrist.  There is dorsal angulation and shortening.  At this point she will be out of the splint completely.  I want to work on range of motion of her left wrist.  I am pleased that it is feeling better for her.  It becomes a chronic issue we can always address it accordingly at that point.  All questions and concerns were answered addressed.  Follow-up is as needed.

## 2021-04-15 ENCOUNTER — Encounter (HOSPITAL_COMMUNITY): Payer: Self-pay | Admitting: Internal Medicine

## 2021-04-15 ENCOUNTER — Ambulatory Visit (HOSPITAL_COMMUNITY)
Admission: RE | Admit: 2021-04-15 | Discharge: 2021-04-15 | Disposition: A | Discharge status: Home / Self Care | Payer: Medicare PPO | Source: Ambulatory Visit | Attending: Internal Medicine | Admitting: Internal Medicine

## 2021-04-15 VITALS — BP 102/60 | HR 76 | Wt 133.6 lb

## 2021-04-15 DIAGNOSIS — I5022 Chronic systolic (congestive) heart failure: Secondary | ICD-10-CM | POA: Insufficient documentation

## 2021-04-15 DIAGNOSIS — L905 Scar conditions and fibrosis of skin: Secondary | ICD-10-CM | POA: Diagnosis not present

## 2021-04-15 DIAGNOSIS — J90 Pleural effusion, not elsewhere classified: Secondary | ICD-10-CM | POA: Diagnosis not present

## 2021-04-15 DIAGNOSIS — J449 Chronic obstructive pulmonary disease, unspecified: Secondary | ICD-10-CM | POA: Insufficient documentation

## 2021-04-15 DIAGNOSIS — Z9181 History of falling: Secondary | ICD-10-CM | POA: Insufficient documentation

## 2021-04-15 DIAGNOSIS — Z79899 Other long term (current) drug therapy: Secondary | ICD-10-CM | POA: Insufficient documentation

## 2021-04-15 DIAGNOSIS — I779 Disorder of arteries and arterioles, unspecified: Secondary | ICD-10-CM | POA: Diagnosis not present

## 2021-04-15 DIAGNOSIS — I255 Ischemic cardiomyopathy: Secondary | ICD-10-CM | POA: Diagnosis not present

## 2021-04-15 DIAGNOSIS — I2510 Atherosclerotic heart disease of native coronary artery without angina pectoris: Secondary | ICD-10-CM | POA: Diagnosis not present

## 2021-04-15 DIAGNOSIS — Z7984 Long term (current) use of oral hypoglycemic drugs: Secondary | ICD-10-CM | POA: Diagnosis not present

## 2021-04-15 DIAGNOSIS — Z87891 Personal history of nicotine dependence: Secondary | ICD-10-CM | POA: Diagnosis not present

## 2021-04-15 DIAGNOSIS — E782 Mixed hyperlipidemia: Secondary | ICD-10-CM | POA: Diagnosis not present

## 2021-04-15 DIAGNOSIS — I110 Hypertensive heart disease with heart failure: Secondary | ICD-10-CM | POA: Diagnosis present

## 2021-04-15 DIAGNOSIS — Z7982 Long term (current) use of aspirin: Secondary | ICD-10-CM | POA: Insufficient documentation

## 2021-04-15 DIAGNOSIS — Z951 Presence of aortocoronary bypass graft: Secondary | ICD-10-CM | POA: Insufficient documentation

## 2021-04-15 DIAGNOSIS — Z95810 Presence of automatic (implantable) cardiac defibrillator: Secondary | ICD-10-CM | POA: Insufficient documentation

## 2021-04-15 LAB — COMPREHENSIVE METABOLIC PANEL
ALT: 27 U/L (ref 0–44)
AST: 25 U/L (ref 15–41)
Albumin: 3.8 g/dL (ref 3.5–5.0)
Alkaline Phosphatase: 83 U/L (ref 38–126)
Anion gap: 8 (ref 5–15)
BUN: 20 mg/dL (ref 8–23)
CO2: 26 mmol/L (ref 22–32)
Calcium: 8.8 mg/dL — ABNORMAL LOW (ref 8.9–10.3)
Chloride: 104 mmol/L (ref 98–111)
Creatinine, Ser: 0.88 mg/dL (ref 0.44–1.00)
GFR, Estimated: >60 mL/min (ref >60)
Glucose, Bld: 113 mg/dL — ABNORMAL HIGH (ref 70–99)
Potassium: 4.6 mmol/L (ref 3.5–5.1)
Sodium: 138 mmol/L (ref 135–145)
Total Bilirubin: 0.6 mg/dL (ref 0.3–1.2)
Total Protein: 6.7 g/dL (ref 6.5–8.1)

## 2021-04-15 LAB — LIPID PANEL
Cholesterol: 93 mg/dL (ref 0–200)
HDL: 61 mg/dL (ref >40)
LDL Cholesterol: 19 mg/dL (ref 0–99)
Total CHOL/HDL Ratio: 1.5 RATIO
Triglycerides: 64 mg/dL (ref <150)
VLDL: 13 mg/dL (ref 0–40)

## 2021-04-15 LAB — CBC
HCT: 41.5 % (ref 36.0–46.0)
Hemoglobin: 13.4 g/dL (ref 12.0–15.0)
MCH: 30.5 pg (ref 26.0–34.0)
MCHC: 32.3 g/dL (ref 30.0–36.0)
MCV: 94.5 fL (ref 80.0–100.0)
Platelets: 314 10*3/uL (ref 150–400)
RBC: 4.39 MIL/uL (ref 3.87–5.11)
RDW: 13.2 % (ref 11.5–15.5)
WBC: 5.3 10*3/uL (ref 4.0–10.5)
nRBC: 0 % (ref 0.0–0.2)

## 2021-04-15 LAB — BRAIN NATRIURETIC PEPTIDE: B Natriuretic Peptide: 254.5 pg/mL — ABNORMAL HIGH (ref 0.0–100.0)

## 2021-04-15 MED ORDER — ENTRESTO 24-26 MG PO TABS: 1.0000 | ORAL_TABLET | Freq: Two times a day (BID) | ORAL | 3 refills | Status: DC

## 2021-04-15 NOTE — Patient Instructions (Signed)
Decrease Entresto to 24/26 mg Twice daily   Labs done today, your results will be available in MyChart, we will contact you for abnormal readings.  Your physician has requested that you have an echocardiogram. Echocardiography is a painless test that uses sound waves to create images of your heart. It provides your doctor with information about the size and shape of your heart and how well your heart's chambers and valves are working. This procedure takes approximately one hour. There are no restrictions for this procedure.  You have been referred to follow up with research, they will call you next week  Your physician recommends that you schedule a follow-up appointment in: 6 months (May, 2023), **PLEASE CALL OUR OFFICE IN MARCH TO SCHEDULE THIS APPOINTMENT  If you have any questions or concerns before your next appointment please send [place] a message through Ai or call our office at [phone].    TO LEAVE A MESSAGE FOR THE NURSE SELECT OPTION 2, PLEASE LEAVE A MESSAGE INCLUDING: YOUR NAME DATE OF BIRTH CALL BACK NUMBER REASON FOR CALL**this is important as we prioritize the call backs  YOU WILL RECEIVE A CALL BACK THE SAME DAY AS LONG AS YOU CALL BEFORE 4:00 PM  At the [facility], you and your health needs are our priority. As part of our continuing mission to provide you with exceptional heart care, we have created designated Provider Care Teams. These Care Teams include your primary Cardiologist (physician) and Advanced Practice Providers (APPs- Physician Assistants and Nurse Practitioners) who all work together to provide you with the care you need, when you need it.   You may see any of the following providers on your designated Care Team at your next follow up: Dr Arvilla Meres Dr Carron Curie, NP Robbie Lis, [place] [facility] [place], [place] Karle Plumber, PharmD   Please be sure to bring in all your medications bottles to every  appointment.

## 2021-04-15 NOTE — Addendum Note (Signed)
Encounter addended by: Noralee Space, RN on: 04/15/2021 12:29 PM  Actions taken: Order list changed, Diagnosis association updated, Clinical Note Signed, Charge Capture section accepted

## 2021-04-15 NOTE — Progress Notes (Addendum)
[facility] Note   ID:  Paulene, Tayag 1945/10/02, MRN [id number]  Location: Home  Provider location: [address] [place] Type of Visit: Established patient  PCP:  Carmin Richmond, MD  Cardiologist:  Arvilla Meres, MD Primary HF: Dr [name]  Chief Complaint: chronic systolic heart failure, CAD   History of Present Illness:  Latoya Adams is a 75 y.o. female with history of CAD s/ CABG x 4 12/08/2016, chronic systolic CHF (EF 31-54% 09/2017) cMRI EF 24% with only small apex scar, s/p MDT ICD, tobacco use, COPD, hx of R pleural effusion with previous pleurex catheter, and chronic anemia.   She presents for routine f/u. Had mechanical fall in 8/22 and broke left arm. Says balance is poor. Says she has a lot of things going on. Her son and 3 kids are living with her. Denies CP, SOB, orthopnea or PND. Very fatigued.   ICD interrogated in person. No VT/AF. Volume ok. Activity level ~1hr/day.     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   Alcohol abuse    Anxiety    Arthritis    CHF (congestive heart failure) (HCC)    Coronary artery disease    Depression    Dyspnea    due to Pulmonary Effusion   History of blood transfusion 2018   during CABG   Hypertension    Myocardial infarction (HCC) 12/2016   Past Surgical History:  Procedure Laterality Date   CHEST TUBE INSERTION Right 03/06/2017   Procedure: INSERTION PLEURAL DRAINAGE CATHETER;  Surgeon: Kerin Perna, MD;  Location: [facility] OR;  Service: Thoracic;  Laterality: Right;   COLONOSCOPY     CORONARY ARTERY BYPASS GRAFT N/A 12/08/2016   Procedure: CORONARY ARTERY BYPASS GRAFTING (CABG)x4 using left internal mammary artery and bilateral greater saphenous veins harvested endoscopically;  Surgeon: Kerin Perna, MD;  Location: [facility] OR;  Service: Open Heart Surgery;  Laterality: N/A;   dental implants     ICD IMPLANT   09/20/2017   ICD IMPLANT N/A 09/20/2017   Procedure: ICD IMPLANT;  Surgeon: Regan Lemming, MD;  Location: [facility] INVASIVE CV LAB;  Service: Cardiovascular;  Laterality: N/A;   IR THORACENTESIS ASP PLEURAL SPACE W/IMG GUIDE  01/24/2017   LEFT HEART CATH AND CORONARY ANGIOGRAPHY N/A 12/03/2016   Procedure: Left Heart Cath and Coronary Angiography;  Surgeon: Tonny Bollman, MD;  Location: [facility] INVASIVE CV LAB;  Service: Cardiovascular;  Laterality: N/A;   REMOVAL OF PLEURAL DRAINAGE CATHETER Right 06/12/2017   Procedure: REMOVAL OF PLEURAL DRAINAGE CATHETER;  Surgeon: Kerin Perna, MD;  Location: [facility] OR;  Service: Thoracic;  Laterality: Right;   RIGHT HEART CATH N/A 12/05/2016   Procedure: Right Heart Cath;  Surgeon: Dolores Patty, MD;  Location: [facility] INVASIVE CV LAB;  Service: Cardiovascular;  Laterality: N/A;   TEE WITHOUT CARDIOVERSION N/A 12/08/2016   Procedure: TRANSESOPHAGEAL ECHOCARDIOGRAM (TEE);  Surgeon: Donata Clay, Theron Arista, MD;  Location: [facility] OR;  Service: Open Heart Surgery;  Laterality: N/A;   TOTAL HIP ARTHROPLASTY Left 03/04/2019   Procedure: LEFT TOTAL HIP ARTHROPLASTY ANTERIOR APPROACH;  Surgeon: Kathryne Hitch, MD;  Location: [facility] OR;  Service: Orthopedics;  Laterality: Left;   TUBAL LIGATION       Current Outpatient Medications  Medication Sig Dispense Refill   aspirin 81 MG chewable tablet Chew 81 mg by mouth daily.     buPROPion (WELLBUTRIN XL) 300  MG 24 hr tablet Take 300 mg by mouth daily.     carvedilol (COREG) 12.5 MG tablet TAKE 1 TABLET TWICE DAILY 180 tablet 3   dapagliflozin propanediol (FARXIGA) 10 MG TABS tablet Take 1 tablet (10 mg total) by mouth daily before breakfast. 90 tablet 3   Evolocumab (REPATHA SURECLICK) 140 MG/ML SOAJ Inject 1 pen into the skin every 14 (fourteen) days. 2 mL 11   Multiple Vitamins-Minerals (CENTRUM WOMEN PO) Take 1 tablet by mouth daily.     rosuvastatin (CRESTOR) 40 MG tablet TAKE 1 TABLET(40 MG) BY MOUTH DAILY 90 tablet 3    sacubitril-valsartan (ENTRESTO) 49-51 MG Take 1 tablet by mouth 2 (two) times daily. 60 tablet 6   sertraline (ZOLOFT) 100 MG tablet Take 150 mg by mouth daily.      spironolactone (ALDACTONE) 25 MG tablet TAKE 1 TABLET EVERY DAY 90 tablet 1   No current facility-administered medications for this encounter.    Allergies:   Codeine, Atorvastatin, and Varenicline tartrate   Social History:  The patient  reports that she quit smoking about 4 years ago. Her smoking use included cigarettes. She has never used smokeless tobacco. She reports that she does not drink alcohol and does not use drugs.   Family History:  [name] family history includes CAD in her sister; Heart block in her father, mother, and sister.   ROS:  Please see the history of present illness.   All other systems are personally reviewed and negative.   Vitals:   04/15/21 1140  BP: 102/60  Pulse: 76  SpO2: 96%  Weight: 60.6 kg (133 lb 9.6 oz)    Exam:   General:  Elderly No resp difficulty HEENT: normal Neck: supple. no JVD. Carotids 2+ bilat; no bruits. No lymphadenopathy or thryomegaly appreciated. Cor: PMI nondisplaced. Regular rate & rhythm. No rubs, gallops or murmurs. Lungs: Decreased throughout Abdomen: soft, nontender, nondistended. No hepatosplenomegaly. No bruits or masses. Good bowel sounds. Extremities: no cyanosis, clubbing, rash, edema  L wrist brace Neuro: alert & orientedx3, cranial nerves grossly intact. moves all 4 extremities w/o difficulty. Affect pleasant   Recent Labs: 08/25/2020: ALT 16; B Natriuretic Peptide 312.5 02/09/2021: BUN 14; Creatinine, Ser 0.92; NT-Pro BNP 1,386; Potassium 4.7; Sodium 137  Personally reviewed   Wt Readings from Last 3 Encounters:  04/15/21 60.6 kg (133 lb 9.6 oz)  02/09/21 60.1 kg (132 lb 6.4 oz)  08/25/20 62.4 kg (137 lb 9.6 oz)     ASSESSMENT AND PLAN:  1. 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 RHC with normal CO and no PAH - s/p CABG x 4. 12/08/16 - Echo 04/2017 EF 20-25%. Echo 09/2017 EF 20%, RV normal. S/p MDT ICD 09/2017. - Echo 09/2017 EF 25%, grade 1 DD, mild AI, mild MR - Echo 12/26/18 EF 30-35% Mild AI. RV ok  - Echo 02/25/20 EF 30-35% RV ok Personally reviewed - Stable NYHA II-III - Volume status looks good - Continue Farxiga - Continue carvedilol 12.5 mg twice a day.   - BP is low. Will decrease Entresto 49/51 mg BID -> 24/26 bid   - Continue spiro 25 mg daily.   - ICD interrogated in person. Report as above - Labs today - Repeat echo - Considering BatWire/Barostim  2. CAD - S/P CABG x4 on 12/08/16 - No s/s ischemia  - Continue ASA 81 mg daily +  statin.  - Continue Crestor and Repatha. Followed by [facility]   3. Recurrent R pleural effusion - S/p Pleurex removal 06/2017. - No recurrence   4. PAD/claudication  - No claudication - ABIs 09/2017: bilateral TBI abnormal.  - Aortoiliac 10/2017: significant left common and external iliac artery disease. VVS consult recommended. She saw Dr Kirke Corin 11/06/17 and wanted to continue medical therapy for now.   5. Tobacco use/COPD - PFTs show severe COPD.  - No longer smoking  Signed, Arvilla Meres, MD  04/15/2021 12:05 PM  [facility] [address] Heart [facility] V[facility] [place] [address] [phone] (office) [phone] (fax)

## 2021-04-25 ENCOUNTER — Ambulatory Visit (HOSPITAL_COMMUNITY): Payer: Medicare PPO

## 2021-04-27 ENCOUNTER — Other Ambulatory Visit: Payer: Self-pay | Admitting: Internal Medicine

## 2021-04-27 ENCOUNTER — Other Ambulatory Visit: Payer: Self-pay | Admitting: *Deleted

## 2021-04-27 DIAGNOSIS — Z006 Encounter for examination for normal comparison and control in clinical research program: Secondary | ICD-10-CM

## 2021-04-27 NOTE — Progress Notes (Signed)
Orders placed for ECHO, Carotids, CT

## 2021-05-03 ENCOUNTER — Ambulatory Visit (HOSPITAL_COMMUNITY): Payer: Self-pay

## 2021-05-03 ENCOUNTER — Encounter (HOSPITAL_COMMUNITY): Payer: Self-pay

## 2021-05-03 ENCOUNTER — Ambulatory Visit (HOSPITAL_COMMUNITY)
Admission: RE | Admit: 2021-05-03 | Discharge: 2021-05-03 | Disposition: A | Discharge status: Home / Self Care | Payer: Medicare PPO | Source: Ambulatory Visit | Attending: Internal Medicine | Admitting: Internal Medicine

## 2021-05-03 ENCOUNTER — Other Ambulatory Visit: Payer: Self-pay

## 2021-05-03 DIAGNOSIS — Z95810 Presence of automatic (implantable) cardiac defibrillator: Secondary | ICD-10-CM | POA: Diagnosis not present

## 2021-05-03 DIAGNOSIS — I082 Rheumatic disorders of both aortic and tricuspid valves: Secondary | ICD-10-CM | POA: Insufficient documentation

## 2021-05-03 DIAGNOSIS — Z87891 Personal history of nicotine dependence: Secondary | ICD-10-CM | POA: Insufficient documentation

## 2021-05-03 DIAGNOSIS — I110 Hypertensive heart disease with heart failure: Secondary | ICD-10-CM | POA: Diagnosis present

## 2021-05-03 DIAGNOSIS — Z951 Presence of aortocoronary bypass graft: Secondary | ICD-10-CM | POA: Insufficient documentation

## 2021-05-03 DIAGNOSIS — I5022 Chronic systolic (congestive) heart failure: Secondary | ICD-10-CM

## 2021-05-03 DIAGNOSIS — I252 Old myocardial infarction: Secondary | ICD-10-CM | POA: Insufficient documentation

## 2021-05-03 LAB — ECHOCARDIOGRAM COMPLETE
Area-P 1/2: 3.28 cm2
Calc EF: 34.3 %
P 1/2 time: 634 msec
S' Lateral: 4.4 cm
Single Plane A2C EF: 37.8 %
Single Plane A4C EF: 29.9 %

## 2021-05-03 NOTE — Progress Notes (Signed)
  Echocardiogram 2D Echocardiogram has been performed.  Latoya Adams M 05/03/2021, 12:28 PM

## 2021-05-10 ENCOUNTER — Other Ambulatory Visit: Payer: Self-pay | Admitting: *Deleted

## 2021-05-10 DIAGNOSIS — Z006 Encounter for examination for normal comparison and control in clinical research program: Secondary | ICD-10-CM

## 2021-05-10 NOTE — Progress Notes (Signed)
Order for echo

## 2021-05-19 ENCOUNTER — Other Ambulatory Visit: Payer: Self-pay

## 2021-05-19 ENCOUNTER — Ambulatory Visit (HOSPITAL_BASED_OUTPATIENT_CLINIC_OR_DEPARTMENT_OTHER)
Admission: RE | Admit: 2021-05-19 | Discharge: 2021-05-19 | Disposition: A | Discharge status: Home / Self Care | Payer: Medicare PPO | Source: Ambulatory Visit | Attending: Cardiology | Admitting: Cardiology

## 2021-05-19 ENCOUNTER — Ambulatory Visit (HOSPITAL_COMMUNITY)
Admission: RE | Admit: 2021-05-19 | Discharge: 2021-05-19 | Disposition: A | Discharge status: Home / Self Care | Payer: Medicare PPO | Source: Ambulatory Visit | Attending: Internal Medicine | Admitting: Internal Medicine

## 2021-05-19 ENCOUNTER — Encounter: Payer: Medicare PPO | Admitting: *Deleted

## 2021-05-19 VITALS — BP 101/59 | HR 79 | Temp 98.0°F | Resp 18 | Ht 67.0 in | Wt 138.0 lb

## 2021-05-19 DIAGNOSIS — Z006 Encounter for examination for normal comparison and control in clinical research program: Secondary | ICD-10-CM

## 2021-05-19 DIAGNOSIS — I340 Nonrheumatic mitral (valve) insufficiency: Secondary | ICD-10-CM

## 2021-05-19 DIAGNOSIS — I351 Nonrheumatic aortic (valve) insufficiency: Secondary | ICD-10-CM

## 2021-05-19 NOTE — Research (Signed)
[place] Informed Consent   Subject Name: Latoya Adams  Subject met inclusion and exclusion criteria.  The informed consent form, study requirements and expectations were reviewed with the subject and questions and concerns were addressed prior to the signing of the consent form.  The subject verbalized understanding of the trial requirements.  The subject agreed to participate in the [facility] trial and signed the informed consent at 0830 on 19-May-2021.  The informed consent was obtained prior to performance of any protocol-specific procedures for the subject.  A copy of the signed informed consent was given to the subject and a copy was placed in the subject's medical record.   Jasmine Pang, RN BSN [facility] Cardiovascular Research & Education Direct Line: [phone]

## 2021-05-19 NOTE — Research (Signed)
Batwire Screening Visit   Mrs. Klaus presented today for [facility] implant screening.     Section A:  Administrative Section  Subject ID: _[id number]__ - __024___ - 015 Subject Initials: _M__ _J__ _E__  Visit Interval:    [x] Screening/Baseline    [] Activation   [] 0.5 Month       [] 1 Month     [] 2 Month     [] 3 Month       [] 6 Month     [] 12 Month         [] Unscheduled, reason for visit: _________________________________________  Section B:  Physical Assessment   Date:    _15____/_DEC____/__2022_____   (DD / MMM / YYYY)  Weight: ___138____  [] kg    [x] pounds Height (Screening Visit Only): __67___  [] cm [x] inches  Blood Pressure: __101__  / __59____mmHg Heart Rate: ___79_______ bpm  Section E:  Signature     Person completing form (Print Name): ______Jessica Bernhardt Riemenschneider RN________   Signature: ______Jessica Azai Gaffin RN______ Date: ___15-DEC-2022__________      SECTION A:  Administrative Section  Patient ID: _[id number]_ - _024____ - 015 Patient Initials: _M__ _J__ E___  Visit Interval:    [x] Screening/Baseline*    [] Activation   [] 0.5 Month       [] 1 Month     [] 2 Month     [] 3 Month       [] 6 Month     [] 12 Month         [] Unscheduled, reason for visit: _________________________________________  * For Screening / Baseline only the questions are based on 30 days prior to consent.  SECTION B:  COVID-19 Like Illness Symptoms   Has the subject experienced any cold, flu or COVID-19 symptoms since the last study visit? [x] No  (skip to section C)     [] Yes, date of onset:  _____/_____ MMM/YYYY    If yes, check all symptoms that apply:   [] Fevers or chills   [] New or Worsening Cough  [] Productive  [] Dry    [] If yes to cough, indicate severity  [] Constant  [] Occasional, several per hour   [] New or worsened shortness of breath   [] Diarrhea   [] Altered or reduced sense of smell or taste   [] Muscle aches/Severe Fatigue   [] Chest pain or tightness   [] Sore throat    [] Nausea or vomiting  SECTION C:  COVID-19 Like Illness Testing   Has the patient been tested for COVID-19 since the last study visit? [x] No     [] Yes, date of test:  _____/_____ MMM/YYYY    If yes, test results:   [] Positive [] Negative [] Unknown  Has the patient been tested for COVID-19 Antibodies since the last study visit? [x] No     [] Yes, date of test:  _____/_____ MMM/YYYY    If yes, test results:   [] Positive [] Negative [] Unknown  Has the patient been vaccinated for COVID-19 since the last study visit? [x] No     [] Yes, date of test:  _____/_____ MMM/YYYY  Has the patient been tested for Influenza (flu) since the last study visit? [x] No     [] Yes, date of test:  _____/_____ MMM/YYYY    If yes, test results:   [] Positive [] Negative [] Unknown  Has the patient been vaccinated for Influenza (flu) since the last study visit? [x] No     [] Yes, date of test:  _____/_____ MMM/YYYY  SECTION D:  COVID-19 Like Illness Exposure  Has the subject been told that they might have had COVID-19/have symptoms suggestive of COVID-19 since the last study visit? [x] No   [] Yes   [] Unknown  Has the subject been exposed to anyone with known or suspected COVID-19 since the last study visit? [x] No   [] Yes   [] Unknown  Has the subject been told that they might have had the flu or influenza since the last study visit? [x] No   [] Yes   [] Unknown  SECTION E:  Effects of COVID Pandemic on Subject's Healthcare Interactions  Since the last study visit, did the subject feel the need to go to an emergency department or hospital for their heart failure but decided not to because of concerns about COVID-19? [x] No   [] Yes   [] Unknown    If yes, how did they seek care (check all that apply):   [] Telemedicine visit [] In-person [] Clinic [] Urgent Care   [] Subject did not change hospital/ER use due to COVID-19 [] Other, specify: ________________________  Since the last study  visit, did the subject have a cardiology/HF related appointment cancelled/rescheduled due to COVID-19 pandemic? [x] No   [] Yes, how many? ___________   [] Unknown  Since the last study visit, did the subject have any cardiology/HF related telemedicine visit due to COVID-19 pandemic? [x] No   [] Yes, how many? ___________   [] Unknown  Since the last study visit, did the subject have a cardiology/HF procedure cancelled/rescheduled due to COVID-19 pandemic? [x] No   [] Yes, how many? ___________   [] Unknown  SECTION F:  Effects of COVID Pandemic on Subject's Medications  Since the last visit, did any of their heart failure medications change or stop, even for a short time?   If yes, enter change into medication eCRF [x] No   [] Yes, how many? ___________   [] Unknown    If yes, why:   [] Instructed by Doctor [] Self-discontinued [] Unknown  Other: ________________  Since the last visit, was the subject prescribed any medications for COVID-19? [x] No   [] Yes   [] Unknown    If yes, what medications (generic name):  SECTION G:  Effects of COVID Pandemic on Subject's Lifestyle  How has the subject's activity/exercise level changed due to COVID-19 pandemic? [x] No change   [] More activity/exercise   [] Less activity/exercise  How has the subject's smoking habits changed due to COVID-19? [x] Does not smoke   [] No change   [] Smoke more   [] Smoke less  How has the subject's alcohol drinking habits changed due to COVID-19? [x] Does not drink   [] No change   [] Drink more   [] Drink less  Section H:  Signature    Person completing form (Print Name): ______Jessica Landers Prajapati RN_____    Signature: __Jessica Keshawn Fiorito RN_______ Date: ___15-DEC-2022_______________________      Patient ID: ___[id number]_ - __024_ - 015 Patient Initials: _M__ _J__ _E__ Visit Interval:  [x]  Baseline     [] 6 Month  Section D:  MLWHF   [place] Living With Heart Failure Questionnaire Date:     _15____/__DEC_____/__2022_____  (DD / MMM / [place])  These  questions concern how your heart failure (heart condition) has prevented you from living as you wanted during the last month.  These items listed below describe different ways some people are affected.  If you are sure an item does not apply to you or is not related to your heart failure then circle 0 (no) and go on to the next item.  If an item does apply to you, then circle the number rating how much it prevented you from living as you wanted.  Did your heart failure prevent you from living as you wanted during the last month by:   No Very little    Very much  1. Causing swelling in your ankles, legs, etc.? 0[x] 1[] 2[] 3[] 4[] 5[]  2. Making you sit or lie down to rest during the day? 0[] 1[] 2[] 3[x] 4[] 5[]  3. Making your walking about or climbing stairs difficult? 0[] 1[] 2[] 3[x] 4[] 5[]  4. Making your working around the house or yard difficult? 0[] 1[] 2[x] 3[] 4[] 5[]  5. Making your going places away from home difficult? 0[] 1[x] 2[] 3[] 4[] 5[]  6. Making your sleeping well at night difficult? 0[x] 1[] 2[] 3[] 4[] 5[]  7. Making your relating to or doing things with your friends or family difficult? 0[] 1[x] 2[]  3[] 4[] 5[]  8. Making your working to earn a living difficult? 0[] 1[] 2[] 3[] 4[] 5[x]  9. Making your recreational pastimes, sports or hobbies difficult? 0[] 1[] 2[] 3[] 4[] 5[x]  10. Making your sexual activities difficult? 0[x] 1[] 2[] 3[] 4[] 5[]  11. Making you eat less of the foods you like? 0[] 1[x] 2[] 3[] 4[] 5[]  12. Making you short of breath? 0[x] 1[] 2[] 3[] 4[] 5[]  13. Making you tired, fatigued, or low on energy? 0[] 1[] 2[] 3[] 4[] 5[x]  14. Making you stay in a hospital? 0[x] 1[] 2[] 3[] 4[] 5[]  15. Costing you money for medical care? 0[] 1[] 2[] 3[] 4[] 5[x]  16. Giving you side effects from medications? 0[] 1[] 2[] 3[] 4[] 5[x]  17. Making you feel you are a burden to your family or friends?  0[x] 1[] 2[] 3[] 4[] 5[]  18. Making you feel a loss of self-control in your life? 0[x] 1[] 2[] 3[] 4[] 5[]  19. Making you worry? 0[x] 1[] 2[] 3[] 4[] 5[]  20. Making it difficult for you to concentrate or remember things? 0[] 1[] 2[] 3[x] 4[] 5[]  21 Making you feel depressed? 0[] 1[] 2[] 3[x] 4[] 5[]  Section E:  Signature Section  Person Administering Questionnaire Name: (person who read first question and handed questionnaire to subject) __Jessica Hazlip RN____ (Print) 15- [phone]   Janett Billow Hazlip RN __ (Sign) (Date)  Person Completing Questionnaire Name: (e.g. 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 CardioMEMS) Specify Device: ____________ Implant Date:        ________/________ (MMM / [place]) [x] []   Myocardial Infarction [x] [] []   CABG [x] [] []   Coronary Intervention [] [x] []   Aortic Valve Disease [] [x] []   Mitral Valve Disease [] [x] []   Left Ventricular Hypertrophy [] [x] []  Cardiac Arrhythmia Atrial fibrillation [] [x] []    Ventricular Tachycardia/Fibrillation [] [x] []    PVC [x] [] []   Pacemaker [] [x] []   ICD [x] [] []   Ablation Procedure Specify: [] atrial [] ventricular Last procedure date:         ________/________ (MMM / [place]) [x] []   Cardioversion Procedure Specify: [] Shock [] Meds Last procedure date:         ________/________ (MMM [place]) [x] []   Sudden Cardiac Death [] [x] []    Yes No Unknown  Peripheral Vascular Peripheral Vascular Disease [] [x]  []   PVD Intervention (e.g. stent, angioplasty, etc.). [] [x]  []  Cerebrovascular / Neurological Stroke [] [x]  []   TIA [] [x]  []  Renal / Hepatic Prior Renal Denervation Date of Denervation:        ________/________ (MMM / [place]) [x] []   Chronic Kidney Disease Stage (1-5): ________   [x] []   Chronic Dialysis [] [x] []   Kidney Transplant [] [x] []  Metabolic / Nutritional Diabetes Mellitus  []  Type 1  []  Type 2 [x] []    Hypokalemia [] [x] []  Section C:  Comments  N/a        Section D:  Signature     Person completing form (Print Name): ____Jessica Lamari Youngers RN ________________________    Signature: _____Jessica Mahathi Pokorney RN___________ Date: ____15-DEC-2022______________________     Subject ID: _[id number]__ - __024_ - 015 Subject Initials: _M__ _J__ _E__ Visit Interval:  [x]  Baseline     [] 6 Month  Section B:  NYHA   NYHA Classification Date:    _15__/__DEC___/__2022___  (DD / MMM / [place])  Select One Class Subject Symptoms  [] I No limitations of physical activity, No undue fatigue, palpitation or dyspnea  [] II Slight limitation of physical activity, Comfortable at rest, Less than ordinary activity results in fatigue, Palpitation, or dyspnea  [x] III Marked limitation of physical activity, Comfortable at rest, Less than ordinary activity results in fatigue, palpitation, or dyspnea  [] IV Unable to carry out any physical activity without discomfort, Symptoms of cardiac insufficiency at rest, Physical activity causes increased discomfort  Name of person conducting NYHA:  ______Kimberly Alba Destine RN__________________    Six Minute Hall Walk Test Date:    __15___/_DEC__/_2022____  (DD / MMM / [place])  Distance Walked ___228_________ meters  Were there any devices used to assist the subject in walking (e.g. cane, walker, etc.)? [x] No   [] Yes specify:_______________________  Was the walk terminated before 6 minutes? [x] No   [] Yes specify reason: (select  all that apply)    [] Angina    []  Dyspnea    []  Fatigue    []  Dizziness    []  Syncope    []  Other, specify:  Name of person conducting 6-Minute Hall Walk: _____Jessica Ivon Roedel RN__________   Page 1 of 1     Confidential   Form Protocol [id number] Rev. B dated 12-Mar-2019   Effective Date: 24-Apr-2019    Section A:  Administrative Section   Subject ID: _[id number]_ - _024_ - 015  Subject Initials: __M___ __J___ __E___  Section B:  Carotid Duplex Ultrasound (CDU) [] Not Done [] Not Done  Date: _15____/__DEC_____/_2022______ (DD / MMM / [place])  [x] Images sent to [facility]  % atherosclerosis in the internal and distal common carotid and duplex measurements:  Right Side Measurements    Both internal and distal common carotids are less than 30% []Yes   [x]  No (cannot be used for implant)   Comments on determination of <30% stenosis:  1-39%  Internal Carotid (ICA) [] Normal (no plaque) PSV (peak systolic velocity) ___70______ cm/sec   [] ?62% EDV (end diastolic velocity) ____37_____ cm/sec   [x] 16-49% (*see CTA) Linear diameter ___0.27______ mm   [] 50-69% Normal spectral waveform  [x] Yes   [] ?70%  [] No, specify pattern: __________________  Distal Common Carotid [facility]) [] Normal (no plaque) PSV (peak systolic velocity) ___62______ cm/sec   [] ?83% EDV (end diastolic velocity) __15_______ cm/sec   [] 16-49% (*see CTA) Linear diameter ___0.45______ mm   [] 50-69% Normal spectral waveform  [x] Yes   [] ?70%  [] No, specify pattern: __________________  Left Side Measurements   Both internal and distal  common carotids are less than 30% []Yes       [x]No (cannot be used for implant)   Comments on determination of <30% stenosis: 1-39%  Internal Carotid (ICA) [] Stenosis Normal (no plaque) PSV (peak systolic velocity) __176_______ cm/sec   [] ?15%  EDV (end diastolic velocity) __16_______ cm/sec   [x] 16-49% (*see CTA) Linear diameter ___0.44______ mm   [] 50-69%  Normal spectral waveform  [x] Yes   [] ?70%   [] No, specify pattern: __________________  Distal Common Carotid [facility])  [] Stenosis Normal (no plaque) PSV (peak systolic velocity) ____07_____ cm/sec   [] ?15%  EDV (end diastolic velocity) ___37______ cm/sec   [] 16-49% (*see CTA) Linear diameter ___0.54______ mm   [] 50-69%  Normal spectral waveform  [x] Yes   [] ?70%   [] No, specify pattern: __________________  Name of person reading CDU:Dr. Monica Martinez  Section C:  Computed Tomography Angiogram (CTA) (if required) [x] Done []Not Done  [] CDU was inconclusive (e.g. *stenosis 16-49%) Date: ___21__/__DEC_____/___2022____  (DD / MMM / [place])  [x]Images sent to [facility]  [] CDU was incomplete/not done    [x] CTA requested by [facility]    % atherosclerosis Internal Carotid (ICA) Distal Common Carotid [facility])  Right Side __10_____% __0_____%  Left Side __30_____% ___0____%  Name of person reading CTA: Caroline Sauger MD   Section D:  Physician Assessment  Are there any vascular structures or orientations or neck anomalies that would be obstructive to the implantation path? [x]No []Yes (Specify side):     [] Left   [] Right  [] Both  Has the subject had prior surgery, radiation, or endovascular stent placement in the carotid artery or the carotid sinus region? [x] No [] Yes (Specify  side):     [] Left   [] Right   [] Both  Is at least one carotid bifurcation below the level of the mandible? [] No [x] Yes (Specify side):     [] Left   [] Right   [] Both  Are there any ulcerative carotid arterial plaques? [x] No [] Yes (Specify side):      [] Left   [] Right   [] Both  Is this subject a candidate for the BATwire procedure? [] No [x] Yes (Specify side):     [] Left   [] Right   [] Both  Section E:  Comments          Section F:  Signature   Person completing form (Print Name): ____Jessica Lashawna Poche RN_____________ Signature: _______Jessica Chiana Wamser RN______ Date: __15-Dec-2022________________________     Section A:  Administrative Section  Subject ID: _[id number]_ - _024____ - 015 Subject Initials: _M__ _J__ _E__ Visit Interval:  [x] Baseline     [] 6 Month     [] 12 Month  Section B:  Cardiac Echo   Cardiac Echo Date:  __15_/__DEC___/_2022____ (DD / MMM / [place])  [] Images sent to [facility]  Heart rate ___66_______ bpm Echo type: [x] 2D Images Captured: (4-Chamber and Biplane required) [x] PS-LAX  Blood Pressure _102_/_60_ mmHg  [x] 3D  [x] Biplane        [x] Apical 4-Chamber        [] Other, specify:_______   LV End Diastolic Dimension ____40___________ mm   LV End Systolic Dimension ____10___________ mm   LA Volume ____31___________ mL   LV End-Diastolic Volume ____272___________ mL   LV End-Systolic Volume ___53____________ mL   LV Ejection Fraction ______35_________ %  Section C:  Comments  N/a          Section C:  Signature   Person completing form (Print Name): ____Jessica Calloway Andrus, RN________   Signature: ____________Jessica Ramon Dredge, RN_ Date: ____15--DEC-2022___        Section A:  Administrative Section  Subject ID: _[id number]_ - __024___ - 015 Subject Initials: _M__ _J__ _E__  Date subject signed informed consent  _15____/__DEC_____/__2022_____      (DD /  MMM      / YYYY)  Has the subject been previously screened?    [x] No     []  Yes                                                         Previous Subject ID _1 _2 _4 _5_ - _0_ _2_ _4_ - 015   Section B:  Enrollment Criteria  In the investigator's opinion does the subject meet the FDA Indication for Use:  [x] Yes    [] No (STOP - subject does not qualify for  the study)  Has the subject been previously, or are currently, randomized in the [facility] BeAT-HF Trial? [] Yes (STOP - subject does not qualify for the study)   [x] No  Has the subject received cardiac resynchronization therapy (CRT) within six months of enrollment, or is actively receiving CRT? [] Yes (STOP - subject does not qualify for the study)   [x] No  Section C:  Serum Biomarkers   NT-proBNP: ___621_____________ Date:    _15____/_DEC______/_2022______                  (  DD / MMM / [place])  eGFR:  _61_____ mL/min/1.46m Date:    __15___/_Dec______/__2022_____                  (DD / MMM / [place]  Negative Pregnancy Test? [x] N/A Date:    _____/_______/_______                  (DD / MMM / [place]   [] Yes     [] No   Section D:  Appropriate Surgical/Study Candidate:   Is the subject able to discontinue the use of antiplatelet drugs (e.g. aspirin) in advance of the procedure, if required? [x] Yes   [] No  Assessed by:   ______Dr. WRock NephewBrabham____ Implanting Physician [x] Yes Date:    __15___/_DEC___/__2022___                 (DD / MMM / [place]   [] No   Section E:  Inclusion/Exclusion Criteria  Does the subject meet all Inclusion Criteria? [x] Yes   [] No (STOP - subject does not qualify for the study)   Check all that apply   [x] Age at least 21 years and no more than 80 years at the time of enrollment.   [x] Appropriate candidate for the surgery as determined by an evaluation from the implanting physician using a carotid duplex ultrasound (CDU) [or Computed Tomography Angiography (CTA) if CDU inconclusive or incomplete] and a review of medical history (including existence of infections that may increase implant risk).  Evaluation must confirm the following within 45 days of the BAROSTIM NEO implant: Appropriate medical condition and medical history for implantation of the BAROSTIM NEO System AND Anatomy that enables this implant procedure, with no vascular structures or orientations or  neck anomalies that would be obstructive to the implantation path AND The artery planned for the BAROSTIM NEO implant must have: A carotid bifurcation below the level of the mandible AND No ulcerative carotid arterial plaques AND No carotid atherosclerosis producing a 30% or greater stenosis in linear diameter in the internal carotid AND No carotid atherosclerosis producing a 30% or greater stenosis in linear diameter in the distal common carotid AND Have had no prior surgery, radiation, or endovascular stent placement in the carotid artery or the carotid sinus region AND Able to discontinue the use of antiplatelet drugs (e.g. aspirin) in advance of the procedure, if required   [x] Six-minute hall walk (6MHW) ? 150 m AND ? 400 m within 45 days prior to implant.   [x] Serum estimated glomerular filtration rate (eGFR) ? 25 mL/min/1.73 m2 using the CKD-EPI method within 45 days prior to the [facility].   [x] Body mass index ? 40 kg/m2 within 45 days prior to the [facility].   [x] If female and of childbearing potential, must use a medically accepted method of birth control (e.g., barrier method with spermicide, oral contraceptive, or abstinence) and agree to continue use of this method for the duration of the study. Women of childbearing potential must have a negative pregnancy test within 14 days prior to the [facility].   [x] Subjects implanted with a cardiac rhythm management device that does not utilize an intracardiac lead, or implanted with a neurostimulation device, must be [place] the [place]   [x] At the end of screening/baseline, subject still meets the Barostim Neo Indication for Use   [x] Signed a [facility]-approved informed consent form for participation in this study.  Does the subject meet any Exclusion Criteria? [] Yes (STOP - subject does not qualify for the study)   [x] No     List criteria # met: __________________________   []  Previously or currently randomized in the [facility] BeAT-HF Trial.   [] Received cardiac resynchronization therapy (CRT) within six months of enrollment or is actively receiving CRT.   [] Any of the following contraindications: Baroreflex failure or autonomic neuropathy  Uncontrolled, symptomatic cardiac bradyarrhythmias Known allergy to silicone or titanium   [] Unstable ventricular arrhythmias.   [] Presence of baseline cranial nerve dysfunction at risk from cervical interventions on the carotid bifurcation determined by the Ear, Nose and Throat (ENT) examination.   [] Subjects with any surgery that has occurred, or is planned to occur, within 45 days of the [facility] NEO implant.   [] Recent history (within 6 months of implant) of significant and uncontrolled bleeding.   [] Known and untreated hypercoagulability state.   [] An inappropriate study candidate as evidenced by: Solid organ or hematologic transplant, or currently being evaluated for an organ transplant. Has received or is receiving LVAD therapy or chronic dialysis. Current or planned treatment with intravenous positive inotrope therapy. Primary pulmonary hypertension. Severe COPD or severe restrictive lung disease (e.g. requires chronic oral steroid use or home oxygen use).  Heart failure secondary to a reversible cause, such as cardiac structural valvular disease, acute myocarditis and pericardial constriction. Clinically significant cardiac structural valvular disease.  Unable or unwilling to fulfill the protocol medication compliance and follow-up requirements, for reasons including but not limited to an unresolved history of alcohol or substance abuse or psychiatric disorder. Active malignancy.  Any other serious medical condition that may adversely affect the safety of the participant or validity of the study, in the opinion of the investigator. Life expectancy less than one year.   [] Any of the following within 3 months prior to  the [facility] NEO implant. Myocardial infarction Unstable angina Coronary intervention (e.g. CABG or PTCA)  Cerebral vascular accident or transient ischemic attack Sudden cardiac death  Surgical cardiac intervention (e.g. cardiac ablation, valve replacement)   [] Enrolled and active in another (e.g. device, pharmaceutical, or biological) clinical study unless approved by the [facility] Clinical department.  Section F:  Adverse Events  Were there any Adverse Events that occurred since consent? [] Yes (complete AE form)   [x] No  Section H:  Medication Changes   Have there been any changes to the subject's home use medications for Arrhythmia, Antiplatelet/Anticoagulation and Heart failure medications during screening and baseline? [] Yes (update Med form)   [x] No  Section I:  Signature   Person completing form (Print) Name: __Jessica Hazlip RN_________________  Signature: _____Jessica Hazlip RN_______ Date: ___15-DEC-2022_______________________      Outpatient Encounter Medications as of 05/19/2021  Medication Sig   aspirin 81 MG chewable tablet Chew 81 mg by mouth daily.   buPROPion (WELLBUTRIN XL) 300 MG 24 hr tablet Take 300 mg by mouth daily.   carvedilol (COREG) 12.5 MG tablet TAKE 1 TABLET TWICE DAILY   dapagliflozin propanediol (FARXIGA) 10 MG TABS tablet Take 1 tablet (10 mg total) by mouth daily before breakfast.   Multiple Vitamins-Minerals (CENTRUM WOMEN PO) Take 1 tablet by mouth daily.   REPATHA SURECLICK 121 MG/ML SOAJ INJECT 1 PEN INTO THE SKIN EVERY 14 DAYS   rosuvastatin (CRESTOR) 40 MG tablet TAKE 1 TABLET(40 MG) BY MOUTH DAILY   sacubitril-valsartan (ENTRESTO) 24-26 MG Take 1 tablet by  mouth 2 (two) times daily.   sertraline (ZOLOFT) 100 MG tablet Take 150 mg by mouth daily.    spironolactone (ALDACTONE) 25 MG tablet TAKE 1 TABLET EVERY DAY   No facility-administered encounter medications on file as of 05/19/2021.

## 2021-05-20 LAB — BASIC METABOLIC PANEL
BUN/Creatinine Ratio: 14 (ref 12–28)
BUN: 14 mg/dL (ref 8–27)
CO2: 22 mmol/L (ref 20–29)
Calcium: 8.7 mg/dL (ref 8.7–10.3)
Chloride: 100 mmol/L (ref 96–106)
Creatinine, Ser: 0.98 mg/dL (ref 0.57–1.00)
Glucose: 124 mg/dL — ABNORMAL HIGH (ref 70–99)
Potassium: 4 mmol/L (ref 3.5–5.2)
Sodium: 136 mmol/L (ref 134–144)
eGFR: 61 mL/min/{1.73_m2} (ref >59)

## 2021-05-20 LAB — PRO B NATRIURETIC PEPTIDE: NT-Pro BNP: 621 pg/mL — ABNORMAL HIGH (ref 0–301)

## 2021-05-24 NOTE — Progress Notes (Signed)
Batwire [phone] screening, you saw this patient in research.

## 2021-05-24 NOTE — Progress Notes (Signed)
Batwire screening for [phone]  Please review

## 2021-05-25 ENCOUNTER — Ambulatory Visit (HOSPITAL_COMMUNITY)
Admission: RE | Admit: 2021-05-25 | Discharge: 2021-05-25 | Disposition: A | Discharge status: Home / Self Care | Payer: Self-pay | Source: Ambulatory Visit | Attending: Internal Medicine | Admitting: Internal Medicine

## 2021-05-25 ENCOUNTER — Other Ambulatory Visit: Payer: Self-pay

## 2021-05-25 ENCOUNTER — Encounter: Payer: Self-pay | Admitting: *Deleted

## 2021-05-25 ENCOUNTER — Encounter (INDEPENDENT_AMBULATORY_CARE_PROVIDER_SITE_OTHER): Payer: Medicare PPO | Admitting: *Deleted

## 2021-05-25 DIAGNOSIS — Z006 Encounter for examination for normal comparison and control in clinical research program: Secondary | ICD-10-CM

## 2021-05-25 MED ORDER — IOHEXOL 350 MG/ML SOLN
75.0000 mL | Freq: Once | INTRAVENOUS | Status: AC | PRN
  Administered 2021-05-25: 10:00:00 75 mL via INTRAVENOUS

## 2021-05-25 NOTE — Progress Notes (Signed)
Section A:  Administrative Section  Subject ID: _[id number]_ - _024____ - 015 Subject Initials: _mje__ ___ ___ Visit Interval:  (* = as required) [x]   Baseline     []  Implant/Pre D/C  Date of Report:   __21___/___dec____/____2022___       (DD / MMM / )  []   1 Month     []  3 Month*  Name of ENT ___John , MD_________  []   6 Month*     []  12 Month*     []  Unscheduled*  Directions:  Baseline: complete sections B-E only   All other visits, complete all sections below (B-G)   Use comments box to document any abnormalities or changes from Baseline  Was the CVRx device turned off (required at pre-discharge & 1 month)? []  Yes   []  No, specify reason why not: __________________________________  Was a laryngoscope used (required at pre-discharge)? [x]  Yes   []  No  Section B:  Dysphonia   Global Rating of Dysphonia: (Absence of recent upper respiratory infection or recent extensive voice abuse)   [x]   Normal (or clear)   []   Mild   []   Mild to Moderate   []   Moderate   []   Moderate to Severe   []   Severe   []   Aphonic  Section C:  Tongue/Pharynx   Assessment Observation Severity (complete only if abnormal); defined as:    Mild Minimal impairment of functioning; patient is able to carry out usual activities.    Moderate Patient experiences sufficient discomfort to interfere with or reduce their usual level of activity.    Severe Clinically evident impairment of functioning; patient is unable to carry out usual activities.  Pharynx at Rest [x]   Symmetric []   Mild   []  Asymmetric []  Moderate     []  Severe  Pharynx with Phonation (ah) [x]   Symmetric []   Mild   []  Asymmetric []  Moderate     []  Severe  Tongue Atrophy [x]   Not Present []   Mild   []  Present []  Moderate     []  Severe  Tongue Mobility [x]   Normal []   Mild   []  Reduced []  Moderate     []  Severe  Tongue Protrusion [x]   Midline []   Mild Side: ____________   []  Deviation []  Moderate      []  Severe   Sensation of the Pharynx  [x]   Normal []   Mild   []  Decreased []  Moderate     []  Severe  Section D:  House-Brackman Facial Paralysis Scale (Circle One)  Grade Impairment  I  [x]  Normal - Patient is normal facial nerve  II Mild Dysfunction (slight weakness, normal symmetry at rest)  III Moderate Dysfunction (obvious but not disfiguring weakness with synkinesis, normal symmetry at rest); Complete eye closure with maximal effort; Good forehead movement  IV Moderately severe dysfunction (obvious and disfiguring asymmetry, significant synkinesis); Incomplete eye closure; moderate forehead movement  V Severe dysfunction (barely perceptible motion  VI Total paralysis (no movement)  Section E:  Baseline Cranial Nerve Assessment (for comparison to pre D/C assessment)  Cranial Nerve V (Trigeminal Nerve) Ask subject to open mouth.  Any deviation to the left or right? Ask subject to move jaw side to side.  Does subject have difficulty moving jaw to either side?  Ask subject to clench their teeth.  Does the subject feel any weakness on either side?  []  Yes to any, specify:   ____________________________  [x]  No to all  Marginal mandibular branch  of cranial nerve VII (Facial Nerve)  Ask the subject to sit and look straight ahead.  Is there any eye droop, tremors, tics, lip corner droop, drooling, asymmetry or expressionless face?  Ask the subject to show their teeth while frowning.  Are there deviations to one side? []  Yes to any, specify:   ____________________________  [x]  No to all  Cranial nerve IX (Glossopharyngeal Nerve) and Cranial nerve X (Vagus Nerve) Ask subject to say ah.  Did the soft palate fail to elevate or was there a deviation of the uvula to the right or left?  Ask the subject to say ahhhh as long possible.  Did the subject have any abnormal voicing, hoarseness, weakness or coughing? []  Yes to any, specify:   ____________________________  [x]  No to all  Cranial nerve XI (Accessory Nerve) Ask the  subject to maintain a turned head position against your hand while you try to push the head back to the midline.  Did the subject have any weakness? Ask the subject to push their head forward against your hand while you push the head backwards.  Was the subject unable to push their head forward? Ask the subject to shrug their shoulders.  Was the subject unable to do so in a symmetric manner? []  Yes to any, specify:   ____________________________  [x]  No to all  Cranial nerve XII (Hypoglossal Nerve) Ask the subject to stick out their tongue.  Was the subject unable to protrude their tongue past their lips, or was there any deviation? Ask the subject to move their tongue from side to side.  Was there any inability to move to the left or right? Ask the subject to push their tongue against a tongue depressor.  Was there any weakness? []  Yes to any, specify:   ____________________________  [x]  No to all  Other observed cranial nerve dysfunction []  Yes, specify:  __________________________  [x]  No  Section F:  Baseline Cranial Nerve Damage  Does the subject have a presence of baseline cranial nerve dysfunction []  Yes, specify what dysfunction: _____________________________________   [x]  No  Section G:  ENT Notes (Pre-D/C and Follow-Up)  Were changes in Dysphonia from baseline or any abnormalities related to the [facility] Implant? []   Yes   []  No   []  Unknown   [x]  Not Applicable (no changes from baseline)  Were changes in Tongue/Pharynx from baseline or any abnormalities related to the [facility] Implant? []   Yes   []  No   []  Unknown   [x]  Not Applicable (no changes from baseline)  Were changes in House-Brackman Facial Paralysis Scale from baseline or any abnormalities related to the [facility] Implant? []   Yes   []  No   []  Unknown   [x]  Not Applicable (no changes from baseline)  Were any of the above changes from baseline potentially related to the intubation for the procedure? []   Yes   []   No   []  Unknown   [x]  Not Applicable (no changes from baseline)  Section H:  Cranial Nerves  Has there been new cranial nerve dysfunction since Baseline?   Cranial nerve V  (Trigeminal Nerve) []  Yes, specify what changed: _________________________________   []  No  Marginal mandibular branch of cranial nerve VII  (Facial Nerve) []  Yes, specify what changed: _________________________________   []  No  Cranial nerve IX (Glossopharyngeal Nerve) []  Yes, specify what changed: _________________________________   []  No  Cranial nerve X  (Vagus Nerve) []  Yes, specify what changed: _________________________________   []   No  Cranial nerve XI  (Accessory Nerve) []  Yes, specify what changed: _________________________________   []  No  Cranial nerve XII  (Hypoglossal Nerve) []  Yes, specify what changed: _________________________________   []  No  Section I:  Comments/Notes/Describe any abnormalities/deficiencies (as well if post-operative change)            Section J:  Signature   Signature of ENT assessing subject: ____John , MD_____  ______21/dec/2022______________ (Date)

## 2021-05-25 NOTE — Research (Signed)
Subject met inclusion and exclusion criteria. The informed consent form, study requirements and expectation were reviewed with the subject and questions and concerns were addressed prior to the signing of the consent form. The subject verbalized understanding of the trial requirements. The subject agreed to participate in the [facility] trial and singed the informed consent. The informed consent was obtained prior to performance of any protocol-specific procedure for the subject. A copy of the signed informed consent was given to the subject and a copy was placed in the subject's medical record.  Subject re-consented documented # [id number] Rev C

## 2021-05-26 LAB — ECHOCARDIOGRAM COMPLETE
Area-P 1/2: 3.48 cm2
Calc EF: 35.1 %
P 1/2 time: 1408 msec
S' Lateral: 4 cm
Single Plane A2C EF: 35.3 %
Single Plane A4C EF: 36.5 %

## 2021-05-26 NOTE — Progress Notes (Signed)
Batwire screening for [phone]  Please review

## 2021-05-26 NOTE — Progress Notes (Signed)
Batwire screening [phone]  Please review

## 2021-06-03 ENCOUNTER — Encounter: Payer: Self-pay | Admitting: *Deleted

## 2021-06-03 DIAGNOSIS — Z006 Encounter for examination for normal comparison and control in clinical research program: Secondary | ICD-10-CM

## 2021-06-03 NOTE — Research (Signed)
Surgical Instructions   Batwire Implant    Your procedure is scheduled on January 11th 2022 at 0830.  Report to [facility] Main Entrance "A" at 0600 A.M., then check in with the Admitting office.  Call this number if you have problems the morning of surgery:  [phone]    Remember:  Do not eat after midnight the night before your surgery     You can hold your meds the morning of procedure.   Multi Vitamin and vit D stopped as of 06/04/2021 (per conversation with patient)   As of today, STOP taking any Aleve, Naproxen, Ibuprofen, Motrin, Advil, Goody's, BC's, all herbal medications, fish oil, and all vitamins.          Do not wear jewelry or makeup Do not wear lotions, powders, perfumes/colognes, or deodorant. Do not shave 48 hours prior to surgery.  Men may shave face and neck. Do not bring valuables to the hospital.             [facility] is not responsible for any belongings or valuables.  Do NOT Smoke (Tobacco/Vaping)  24 hours prior to your procedure If you use a CPAP at night, you may bring all equipment for your overnight stay.   Contacts, glasses, dentures or bridgework may not be worn into surgery, please bring cases for these belongings   For patients admitted to the hospital, discharge time will be determined by your treatment team.   Patients discharged the day of surgery will not be allowed to drive home, and someone needs to stay with them for 24 hours.  ONLY 1 SUPPORT PERSON MAY BE PRESENT WHILE YOU ARE IN SURGERY. IF YOU ARE TO BE ADMITTED ONCE YOU ARE IN YOUR ROOM YOU WILL BE ALLOWED TWO (2) VISITORS.    Special instructions:    Oral Hygiene is also important to reduce your risk of infection.  Remember - BRUSH YOUR TEETH THE MORNING OF SURGERY WITH YOUR REGULAR TOOTHPASTE    Day of Surgery:  Wear Clean/Comfortable clothing the morning of surgery Do not apply any deodorants/lotions.   Remember to brush your teeth WITH YOUR REGULAR TOOTHPASTE.

## 2021-06-07 DIAGNOSIS — Z01818 Encounter for other preprocedural examination: Secondary | ICD-10-CM

## 2021-06-07 NOTE — Progress Notes (Signed)
Orders for Procedure placed

## 2021-06-10 ENCOUNTER — Encounter: Payer: Self-pay | Admitting: Cardiology

## 2021-06-10 NOTE — Progress Notes (Addendum)
Patient has a Medtronic device. Device orders have been sent to the device clinic and [employer] local rep has been notified of patients procedure time and date.  Device interrogation will be needed   06/14/2021 10:11 AM UPDATE:  [employer] rep will need to be present for procedure,  [employer] rep was emailed information for Procedure date and time.    Jasmine Pang, RN BSN  [facility] Cardiovascular Research & Education Direct Line: [phone]

## 2021-06-10 NOTE — Progress Notes (Signed)
PERIOPERATIVE PRESCRIPTION FOR IMPLANTED CARDIAC DEVICE PROGRAMMING  Patient Information: Name:  Latoya Adams  DOB:  [birth year]  MRN:  [id number]    Planned Procedure:  Insertion of Barostim  Surgeon:  Annamarie Major  Date of Procedure:  06/15/2021  Cautery will be used.  Position during surgery:  Supine   Please send documentation back to:  [facility] (Fax # [phone])  Device Information:  Clinic EP Physician:  Allegra Lai, MD   Device Type:  Defibrillator Manufacturer and Phone #:  Medtronic: [phone] Pacemaker Dependent?:  No. Date of Last Device Check:  03/21/2021 Normal Device Function?:  Yes.    Electrophysiologist's Recommendations:  Have magnet available. Provide continuous ECG monitoring when magnet is used or reprogramming is to be performed.  Procedure will likely interfere with device function.  Device should be programmed:  Tachy therapies disabled Will need Medtr[employer] industry Rep  Per [facility] Standing Orders, Wanda Plump, RN  4:46 PM 06/10/2021

## 2021-06-15 ENCOUNTER — Encounter (HOSPITAL_COMMUNITY): Payer: Self-pay | Admitting: Surgery

## 2021-06-15 ENCOUNTER — Ambulatory Visit (HOSPITAL_COMMUNITY)
Admission: RE | Disposition: A | Discharge status: Home / Self Care | Payer: Self-pay | Source: Ambulatory Visit | Attending: Surgery

## 2021-06-15 ENCOUNTER — Inpatient Hospital Stay (HOSPITAL_COMMUNITY): Payer: Medicare PPO | Admitting: Certified Registered Nurse Anesthetist

## 2021-06-15 ENCOUNTER — Inpatient Hospital Stay (HOSPITAL_COMMUNITY): Payer: Medicare PPO

## 2021-06-15 ENCOUNTER — Other Ambulatory Visit: Payer: Self-pay

## 2021-06-15 ENCOUNTER — Observation Stay (HOSPITAL_COMMUNITY)
Admission: RE | Admit: 2021-06-15 | Discharge: 2021-06-16 | Disposition: A | Discharge status: Home / Self Care | Payer: Medicare PPO | Source: Ambulatory Visit | Attending: Surgery | Admitting: Surgery

## 2021-06-15 DIAGNOSIS — Z01818 Encounter for other preprocedural examination: Secondary | ICD-10-CM

## 2021-06-15 DIAGNOSIS — I255 Ischemic cardiomyopathy: Secondary | ICD-10-CM

## 2021-06-15 DIAGNOSIS — I5042 Chronic combined systolic (congestive) and diastolic (congestive) heart failure: Secondary | ICD-10-CM | POA: Diagnosis present

## 2021-06-15 DIAGNOSIS — I5022 Chronic systolic (congestive) heart failure: Secondary | ICD-10-CM

## 2021-06-15 DIAGNOSIS — Z20822 Contact with and (suspected) exposure to covid-19: Secondary | ICD-10-CM | POA: Insufficient documentation

## 2021-06-15 DIAGNOSIS — I2510 Atherosclerotic heart disease of native coronary artery without angina pectoris: Secondary | ICD-10-CM | POA: Insufficient documentation

## 2021-06-15 DIAGNOSIS — Z951 Presence of aortocoronary bypass graft: Secondary | ICD-10-CM | POA: Insufficient documentation

## 2021-06-15 DIAGNOSIS — I5030 Unspecified diastolic (congestive) heart failure: Secondary | ICD-10-CM

## 2021-06-15 DIAGNOSIS — Z95810 Presence of automatic (implantable) cardiac defibrillator: Secondary | ICD-10-CM | POA: Insufficient documentation

## 2021-06-15 DIAGNOSIS — I428 Other cardiomyopathies: Secondary | ICD-10-CM

## 2021-06-15 DIAGNOSIS — I110 Hypertensive heart disease with heart failure: Secondary | ICD-10-CM | POA: Insufficient documentation

## 2021-06-15 DIAGNOSIS — Z87891 Personal history of nicotine dependence: Secondary | ICD-10-CM | POA: Insufficient documentation

## 2021-06-15 HISTORY — PX: BAROREFLEX SYSTEM INSERTION: EP1254

## 2021-06-15 LAB — BASIC METABOLIC PANEL
Anion gap: 12 (ref 5–15)
BUN: 18 mg/dL (ref 8–23)
CO2: 22 mmol/L (ref 22–32)
Calcium: 8.8 mg/dL — ABNORMAL LOW (ref 8.9–10.3)
Chloride: 106 mmol/L (ref 98–111)
Creatinine, Ser: 1.06 mg/dL — ABNORMAL HIGH (ref 0.44–1.00)
GFR, Estimated: 55 mL/min — ABNORMAL LOW (ref >60)
Glucose, Bld: 90 mg/dL (ref 70–99)
Potassium: 3.5 mmol/L (ref 3.5–5.1)
Sodium: 140 mmol/L (ref 135–145)

## 2021-06-15 LAB — TYPE AND SCREEN
ABO/RH(D): B NEG
Antibody Screen: NEGATIVE

## 2021-06-15 LAB — CBC
HCT: 39.9 % (ref 36.0–46.0)
Hemoglobin: 12.9 g/dL (ref 12.0–15.0)
MCH: 31.5 pg (ref 26.0–34.0)
MCHC: 32.3 g/dL (ref 30.0–36.0)
MCV: 97.3 fL (ref 80.0–100.0)
Platelets: 298 10*3/uL (ref 150–400)
RBC: 4.1 MIL/uL (ref 3.87–5.11)
RDW: 13.4 % (ref 11.5–15.5)
WBC: 4.7 10*3/uL (ref 4.0–10.5)
nRBC: 0 % (ref 0.0–0.2)

## 2021-06-15 LAB — SURGICAL PCR SCREEN
MRSA, PCR: NEGATIVE
Staphylococcus aureus: NEGATIVE

## 2021-06-15 LAB — SARS CORONAVIRUS 2 BY RT PCR (HOSPITAL ORDER, PERFORMED IN ~~LOC~~ HOSPITAL LAB): SARS Coronavirus 2: NEGATIVE

## 2021-06-15 SURGERY — BAROREFLEX SYSTEM INSERTION
Anesthesia: General

## 2021-06-15 MED ORDER — MIDAZOLAM HCL 2 MG/2ML IJ SOLN
INTRAMUSCULAR | Status: AC
  Filled 2021-06-15: qty 2

## 2021-06-15 MED ORDER — DOCUSATE SODIUM 100 MG PO CAPS
100.0000 mg | ORAL_CAPSULE | Freq: Every day | ORAL | Status: DC
  Filled 2021-06-15: qty 1

## 2021-06-15 MED ORDER — ACETAMINOPHEN 325 MG RE SUPP
325.0000 mg | RECTAL | Status: DC | PRN
  Filled 2021-06-15: qty 2

## 2021-06-15 MED ORDER — SODIUM CHLORIDE 0.9 % IV SOLN
INTRAVENOUS | Status: AC
  Filled 2021-06-15: qty 2

## 2021-06-15 MED ORDER — MIDAZOLAM HCL 2 MG/2ML IJ SOLN
INTRAMUSCULAR | Status: DC | PRN
  Administered 2021-06-15: 2 mg via INTRAVENOUS

## 2021-06-15 MED ORDER — LABETALOL HCL 5 MG/ML IV SOLN: 10.0000 mg | INTRAVENOUS | Status: DC | PRN

## 2021-06-15 MED ORDER — BUPROPION HCL ER (XL) 300 MG PO TB24
300.0000 mg | ORAL_TABLET | Freq: Every day | ORAL | Status: DC
Start: 2021-06-15 — End: 2021-06-16
  Administered 2021-06-15 – 2021-06-16 (×2): 300 mg via ORAL
  Filled 2021-06-15 (×2): qty 1

## 2021-06-15 MED ORDER — SERTRALINE HCL 50 MG PO TABS
150.0000 mg | ORAL_TABLET | Freq: Every day | ORAL | Status: DC
  Administered 2021-06-15 – 2021-06-16 (×2): 150 mg via ORAL
  Filled 2021-06-15 (×2): qty 1

## 2021-06-15 MED ORDER — HEPARIN (PORCINE) IN NACL 1000-0.9 UT/500ML-% IV SOLN
INTRAVENOUS | Status: DC | PRN
  Administered 2021-06-15: 500 mL

## 2021-06-15 MED ORDER — SACUBITRIL-VALSARTAN 24-26 MG PO TABS
1.0000 | ORAL_TABLET | Freq: Two times a day (BID) | ORAL | Status: DC
  Administered 2021-06-15 – 2021-06-16 (×3): 1 via ORAL
  Filled 2021-06-15 (×3): qty 1

## 2021-06-15 MED ORDER — DAPAGLIFLOZIN PROPANEDIOL 10 MG PO TABS
10.0000 mg | ORAL_TABLET | Freq: Every day | ORAL | Status: DC
  Administered 2021-06-16: 10 mg via ORAL
  Filled 2021-06-15: qty 1

## 2021-06-15 MED ORDER — ETOMIDATE 2 MG/ML IV SOLN
INTRAVENOUS | Status: DC | PRN
  Administered 2021-06-15: 12 mg via INTRAVENOUS

## 2021-06-15 MED ORDER — METOPROLOL TARTRATE 5 MG/5ML IV SOLN: 2.0000 mg | INTRAVENOUS | Status: DC | PRN

## 2021-06-15 MED ORDER — ONDANSETRON HCL 4 MG/2ML IJ SOLN: 4.0000 mg | Freq: Four times a day (QID) | INTRAMUSCULAR | Status: DC | PRN

## 2021-06-15 MED ORDER — CEFAZOLIN SODIUM-DEXTROSE 2-4 GM/100ML-% IV SOLN
2.0000 g | INTRAVENOUS | Status: AC
  Administered 2021-06-15: 2 g via INTRAVENOUS
  Filled 2021-06-15: qty 100

## 2021-06-15 MED ORDER — DEXAMETHASONE SODIUM PHOSPHATE 10 MG/ML IJ SOLN
INTRAMUSCULAR | Status: DC | PRN
  Administered 2021-06-15: 8 mg via INTRAVENOUS

## 2021-06-15 MED ORDER — GUAIFENESIN-DM 100-10 MG/5ML PO SYRP: 15.0000 mL | ORAL_SOLUTION | ORAL | Status: DC | PRN

## 2021-06-15 MED ORDER — PHENYLEPHRINE HCL-NACL 20-0.9 MG/250ML-% IV SOLN
INTRAVENOUS | Status: DC | PRN
  Administered 2021-06-15: 60 ug/min via INTRAVENOUS

## 2021-06-15 MED ORDER — SERTRALINE HCL 150 MG PO CAPS: 150.0000 mg | ORAL_CAPSULE | Freq: Every day | ORAL | Status: DC

## 2021-06-15 MED ORDER — PHENYLEPHRINE HCL (PRESSORS) 10 MG/ML IV SOLN
INTRAVENOUS | Status: DC | PRN
Start: 2021-06-15 — End: 2021-06-15
  Administered 2021-06-15: 80 ug via INTRAVENOUS

## 2021-06-15 MED ORDER — CHLORHEXIDINE GLUCONATE 4 % EX LIQD: 4.0000 "application " | Freq: Once | CUTANEOUS | Status: DC

## 2021-06-15 MED ORDER — LACTATED RINGERS IV SOLN: INTRAVENOUS | Status: DC

## 2021-06-15 MED ORDER — FENTANYL CITRATE (PF) 100 MCG/2ML IJ SOLN
INTRAMUSCULAR | Status: AC
  Filled 2021-06-15: qty 2

## 2021-06-15 MED ORDER — MAGNESIUM SULFATE 2 GM/50ML IV SOLN: 2.0000 g | Freq: Every day | INTRAVENOUS | Status: DC | PRN

## 2021-06-15 MED ORDER — SODIUM CHLORIDE 0.9 % IV SOLN: INTRAVENOUS | Status: DC

## 2021-06-15 MED ORDER — POVIDONE-IODINE 10 % EX SWAB
2.0000 "application " | Freq: Once | CUTANEOUS | Status: AC
  Administered 2021-06-15: 2 via TOPICAL

## 2021-06-15 MED ORDER — ROCURONIUM BROMIDE 10 MG/ML (PF) SYRINGE
PREFILLED_SYRINGE | INTRAVENOUS | Status: DC | PRN
  Administered 2021-06-15: 40 mg via INTRAVENOUS
  Administered 2021-06-15: 60 mg via INTRAVENOUS

## 2021-06-15 MED ORDER — SUGAMMADEX SODIUM 200 MG/2ML IV SOLN
INTRAVENOUS | Status: DC | PRN
  Administered 2021-06-15: 300 mg via INTRAVENOUS

## 2021-06-15 MED ORDER — ACETAMINOPHEN 325 MG PO TABS
325.0000 mg | ORAL_TABLET | ORAL | Status: DC | PRN
  Administered 2021-06-15 – 2021-06-16 (×3): 650 mg via ORAL
  Filled 2021-06-15 (×3): qty 2

## 2021-06-15 MED ORDER — CEFAZOLIN SODIUM-DEXTROSE 2-4 GM/100ML-% IV SOLN
2.0000 g | Freq: Three times a day (TID) | INTRAVENOUS | Status: AC
  Administered 2021-06-15 – 2021-06-16 (×2): 2 g via INTRAVENOUS
  Filled 2021-06-15 (×2): qty 100

## 2021-06-15 MED ORDER — SODIUM CHLORIDE 0.9 % IV SOLN: 500.0000 mL | Freq: Once | INTRAVENOUS | Status: DC | PRN

## 2021-06-15 MED ORDER — PHENYLEPHRINE 40 MCG/ML (10ML) SYRINGE FOR IV PUSH (FOR BLOOD PRESSURE SUPPORT)
PREFILLED_SYRINGE | INTRAVENOUS | Status: DC | PRN
  Administered 2021-06-15: 80 ug via INTRAVENOUS
  Administered 2021-06-15: 120 ug via INTRAVENOUS

## 2021-06-15 MED ORDER — ACETAMINOPHEN 500 MG PO TABS
1000.0000 mg | ORAL_TABLET | Freq: Once | ORAL | Status: AC
  Administered 2021-06-15: 1000 mg via ORAL
  Filled 2021-06-15 (×2): qty 2

## 2021-06-15 MED ORDER — CHLORHEXIDINE GLUCONATE 0.12 % MT SOLN
OROMUCOSAL | Status: AC
  Administered 2021-06-15: 15 mL
  Filled 2021-06-15: qty 15

## 2021-06-15 MED ORDER — PANTOPRAZOLE SODIUM 40 MG PO TBEC
40.0000 mg | DELAYED_RELEASE_TABLET | Freq: Every day | ORAL | Status: DC
  Administered 2021-06-15 – 2021-06-16 (×2): 40 mg via ORAL
  Filled 2021-06-15 (×2): qty 1

## 2021-06-15 MED ORDER — ALUM & MAG HYDROXIDE-SIMETH 200-200-20 MG/5ML PO SUSP: 15.0000 mL | ORAL | Status: DC | PRN

## 2021-06-15 MED ORDER — MORPHINE SULFATE (PF) 2 MG/ML IV SOLN
2.0000 mg | INTRAVENOUS | Status: DC | PRN
  Administered 2021-06-15: 2 mg via INTRAVENOUS
  Filled 2021-06-15: qty 1

## 2021-06-15 MED ORDER — HYDRALAZINE HCL 20 MG/ML IJ SOLN: 5.0000 mg | INTRAMUSCULAR | Status: DC | PRN

## 2021-06-15 MED ORDER — SODIUM CHLORIDE 0.9 % IV SOLN
0.1500 ug/kg/min | INTRAVENOUS | Status: DC
  Administered 2021-06-15: .1 ug/kg/min via INTRAVENOUS
  Filled 2021-06-15 (×2): qty 2000

## 2021-06-15 MED ORDER — PHENOL 1.4 % MT LIQD: 1.0000 | OROMUCOSAL | Status: DC | PRN

## 2021-06-15 MED ORDER — CEFAZOLIN SODIUM-DEXTROSE 2-4 GM/100ML-% IV SOLN
INTRAVENOUS | Status: AC
  Filled 2021-06-15: qty 100

## 2021-06-15 MED ORDER — SODIUM CHLORIDE 0.9 % IV SOLN
80.0000 mg | INTRAVENOUS | Status: AC
  Administered 2021-06-15: 80 mg

## 2021-06-15 MED ORDER — FENTANYL CITRATE (PF) 100 MCG/2ML IJ SOLN
25.0000 ug | INTRAMUSCULAR | Status: DC | PRN
  Administered 2021-06-15 (×2): 25 ug via INTRAVENOUS
  Administered 2021-06-15: 50 ug via INTRAVENOUS
  Administered 2021-06-15 (×2): 25 ug via INTRAVENOUS

## 2021-06-15 MED ORDER — ONDANSETRON HCL 4 MG/2ML IJ SOLN
INTRAMUSCULAR | Status: DC | PRN
  Administered 2021-06-15: 4 mg via INTRAVENOUS

## 2021-06-15 MED ORDER — HEPARIN (PORCINE) IN NACL 1000-0.9 UT/500ML-% IV SOLN
INTRAVENOUS | Status: AC
  Filled 2021-06-15: qty 500

## 2021-06-15 MED ORDER — POTASSIUM CHLORIDE CRYS ER 20 MEQ PO TBCR: 20.0000 meq | EXTENDED_RELEASE_TABLET | Freq: Every day | ORAL | Status: DC | PRN

## 2021-06-15 MED ORDER — GLYCOPYRROLATE 0.2 MG/ML IJ SOLN
INTRAMUSCULAR | Status: DC | PRN
  Administered 2021-06-15: .2 mg via INTRAVENOUS

## 2021-06-15 MED ORDER — SPIRONOLACTONE 25 MG PO TABS
25.0000 mg | ORAL_TABLET | Freq: Every day | ORAL | Status: DC
  Administered 2021-06-15 – 2021-06-16 (×2): 25 mg via ORAL
  Filled 2021-06-15 (×2): qty 1

## 2021-06-15 SURGICAL SUPPLY — 5 items
COVER DOME SNAP 22 D (MISCELLANEOUS) ×1 IMPLANT
GENERATOR IPG BAROSTIM 2102 (Generator) ×1 IMPLANT
LEAD CAROTID BAROSTIM 1036 (Lead) ×1 IMPLANT
SHEATH PROBE COVER 6X72 (BAG) ×1 IMPLANT
TRAY PACEMAKER INSERTION (PACKS) ×1 IMPLANT

## 2021-06-15 NOTE — H&P (Signed)
PCP: Pcp, No Primary Cardiologist: Dr [name] Primary EP: Dr Radford Pax is a 76 y.o. female who presents today for BAT therapy via Batwire study approach.  Since last being seen in our clinic, the patient reports doing reasonably well.  She has chronic NYHA Class III symptoms with fatigue.  Today, she denies symptoms of palpitations, chest pain,   lower extremity edema, dizziness, presyncope, syncope, or ICD shocks.  The patient is otherwise without complaint today.   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   Alcohol abuse    Anxiety    Arthritis    CHF (congestive heart failure) (HCC)    Coronary artery disease    Depression    Dyspnea    due to Pulmonary Effusion   History of blood transfusion 2018   during CABG   Hypertension    Myocardial infarction (HCC) 12/2016   Past Surgical History:  Procedure Laterality Date   CHEST TUBE INSERTION Right 03/06/2017   Procedure: INSERTION PLEURAL DRAINAGE CATHETER;  Surgeon: Kerin Perna, MD;  Location: [facility] OR;  Service: Thoracic;  Laterality: Right;   COLONOSCOPY     CORONARY ARTERY BYPASS GRAFT N/A 12/08/2016   Procedure: CORONARY ARTERY BYPASS GRAFTING (CABG)x4 using left internal mammary artery and bilateral greater saphenous veins harvested endoscopically;  Surgeon: Kerin Perna, MD;  Location: [facility] OR;  Service: Open Heart Surgery;  Laterality: N/A;   dental implants     ICD IMPLANT  09/20/2017   ICD IMPLANT N/A 09/20/2017   Procedure: ICD IMPLANT;  Surgeon: Regan Lemming, MD;  Location: [facility] INVASIVE CV LAB;  Service: Cardiovascular;  Laterality: N/A;   IR THORACENTESIS ASP PLEURAL SPACE W/IMG GUIDE  01/24/2017   LEFT HEART CATH AND CORONARY ANGIOGRAPHY N/A 12/03/2016   Procedure: Left Heart Cath and Coronary Angiography;  Surgeon: Tonny Bollman, MD;  Location: [facility] INVASIVE CV LAB;  Service: Cardiovascular;  Laterality: N/A;   REMOVAL OF PLEURAL  DRAINAGE CATHETER Right 06/12/2017   Procedure: REMOVAL OF PLEURAL DRAINAGE CATHETER;  Surgeon: Kerin Perna, MD;  Location: [facility] OR;  Service: Thoracic;  Laterality: Right;   RIGHT HEART CATH N/A 12/05/2016   Procedure: Right Heart Cath;  Surgeon: Dolores Patty, MD;  Location: [facility] INVASIVE CV LAB;  Service: Cardiovascular;  Laterality: N/A;   TEE WITHOUT CARDIOVERSION N/A 12/08/2016   Procedure: TRANSESOPHAGEAL ECHOCARDIOGRAM (TEE);  Surgeon: Donata Clay, Theron Arista, MD;  Location: [facility] OR;  Service: Open Heart Surgery;  Laterality: N/A;   TOTAL HIP ARTHROPLASTY Left 03/04/2019   Procedure: LEFT TOTAL HIP ARTHROPLASTY ANTERIOR APPROACH;  Surgeon: Kathryne Hitch, MD;  Location: [facility] OR;  Service: Orthopedics;  Laterality: Left;   TUBAL LIGATION      Current Facility-Administered Medications  Medication Dose Route Frequency Provider Last Rate Last Admin   0.9 %  sodium chloride infusion   Intravenous Continuous Denney Shein, MD       ceFAZolin (ANCEF) IVPB 2g/100 mL premix  2 g Intravenous On Call Hillis Range, MD       chlorhexidine (HIBICLENS) 4 % liquid 4 application  4 application Topical Once Yehia Mcbain, Fayrene Fearing, MD       gentamicin (GARAMYCIN) 80 mg in sodium chloride 0.9 % 500 mL irrigation  80 mg Irrigation On Call Hillis Range, MD       lactated ringers infusion   Intravenous Continuous Gaynelle Adu, MD       remifentanil (ULTIVA) 2 mg in  100 mL normal saline (20 mcg/mL) Optime  0.15 mcg/kg/min Intravenous Continuous Epifanio Lesches, CRNA        Physical Exam: Vitals:   06/15/21 0704  BP: (!) 124/58  Pulse: 81  Resp: 18  Temp: 97.7 F (36.5 C)  TempSrc: Oral  SpO2: 93%  Weight: 61.2 kg  Height: 5\' 7"  (1.702 m)    GEN- The patient is well appearing, alert and oriented x 3 today.   Head- normocephalic, atraumatic Eyes-  Sclera clear, conjunctiva pink Ears- hearing intact Oropharynx- clear Lungs- Clear to ausculation bilaterally, normal work of breathing Chest- ICD  pocket is well healed Heart- Regular rate and rhythm, no murmurs, rubs or gallops, PMI not laterally displaced GI- soft, NT, ND, + BS Extremities- no clubbing, cyanosis, or edema    Wt Readings from Last 3 Encounters:  06/15/21 61.2 kg  05/19/21 62.6 kg  04/15/21 60.6 kg    Assessment and Plan:  1.  Chronic systolic dysfunction/ ischemic CM The patient has NYHA Class III CHF despite GDMT.  She meets criteria for BAT therapy.  She has agreed to 13/11/22 trial.  Risks, benefits, and alternatives to BAT therapy/ Batwire were discussed at length. The patient understands that risks include but are not limited to bleeding, infection, neurovascular damage with possible nerve paralysis which could affect facial features, swallowing, etc  MI, stroke, death, , and lead dislodgement and wishes to proceed.     [employer] MD, [facility] 06/15/2021 8:14 AM

## 2021-06-15 NOTE — Progress Notes (Signed)
Pt arrived to unit from cath lab  VSS, A/O x 4,  CCMD called ,CHG given, pt oriented to unit,Will continue to monitor.   Karna Christmas Devinne Epstein, RN    06/15/21 1302  Vitals  Temp 97.7 F (36.5 C)  Temp Source Oral  BP 122/60  MAP (mmHg) 80  BP Location Right Arm  BP Method Automatic  Patient Position (if appropriate) Lying  Pulse Rate 86  Pulse Rate Source Monitor  ECG Heart Rate 85  Resp 20  Level of Consciousness  Level of Consciousness Alert  Oxygen Therapy  SpO2 100 %  O2 Device Room Air  Pulse Oximetry Type Continuous  Pain Assessment  Pain Scale 0-10  Pain Score 8  Pain Type Surgical pain  Pain Location Jaw  Pain Orientation Right  PCA/Epidural/Spinal Assessment  Respiratory Pattern Regular;Unlabored  Height and Weight  Height 5\' 7"  (1.702 m)  Weight in (lb) to have BMI = 25 159.3

## 2021-06-15 NOTE — Anesthesia Procedure Notes (Signed)
Arterial Line Insertion Start/End1/04/2022 8:10 AM, 06/15/2021 8:23 AM Performed by: Epifanio Lesches, CRNA, CRNA  Patient location: Pre-op. Preanesthetic checklist: patient identified, IV checked, site marked, risks and benefits discussed, surgical consent, monitors and equipment checked, pre-op evaluation, timeout performed and anesthesia consent Lidocaine 1% used for infiltration and patient sedated Right, radial was placed Catheter size: 20 G Hand hygiene performed  and maximum sterile barriers used  Allen's test indicative of satisfactory collateral circulation Attempts: 2 Procedure performed without using ultrasound guided technique. Following insertion, dressing applied and Biopatch. Patient tolerated the procedure well with no immediate complications.

## 2021-06-15 NOTE — Transfer of Care (Signed)
Immediate Anesthesia Transfer of Care Note  Patient: Latoya Adams  Procedure(s) Performed: BAROREFLEX SYSTEM INSERTION  Patient Location: PACU  Anesthesia Type:General  Level of Consciousness: awake, alert  and oriented  Airway & Oxygen Therapy: Patient Spontanous Breathing  Post-op Assessment: Report given to RN and Post -op Vital signs reviewed and stable  Post vital signs: Reviewed and stable  Last Vitals:  Vitals Value Taken Time  BP 135/53 06/15/21 1044  Temp    Pulse 94 06/15/21 1046  Resp 17 06/15/21 1046  SpO2 99 % 06/15/21 1046  Vitals shown include unvalidated device data.  Last Pain:  Vitals:   06/15/21 0740  TempSrc:   PainSc: 0-No pain         Complications: No notable events documented.

## 2021-06-15 NOTE — H&P (Signed)
[facility]ist [facility] [place]  Patient name: Latoya Adams MRN: [id number] DOB: 12/18/1945 Sex: female     HISOTRY OF PRESENT ILLNESS:    Latoya Adams is a 76 y.o. female with NYHA class III heart failure who is being evaluated for Barostim therapy via the [place] research study.  Patient has history of an AICD implant on 09/20/2017.  She is status post MI in 2018.  She is on a statin for hypercholesterolemia.  Her heart failure symptoms have not improved despite titration to goal-directed medical therapy.  Most recent ejection fraction was 30%.   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   Alcohol abuse    Anxiety    Arthritis    CHF (congestive heart failure) (HCC)    Coronary artery disease    Depression    Dyspnea    due to Pulmonary Effusion   History of blood transfusion 2018   during CABG   Hypertension    Myocardial infarction [facility]) 12/2016     FAMILY HISTORY:   Family History  Problem Relation Age of Onset   Heart block Mother    Heart block Father    CAD Sister    Heart block Sister    Heart attack Neg Hx     SOCIAL HISTORY:   Social History   Tobacco Use   Smoking status: Former    Years: 50.00    Types: Cigarettes    Quit date: 12/03/2016    Years since quitting: 4.5   Smokeless tobacco: Never  Substance Use Topics   Alcohol use: No    Comment: Recovering  Alocholic     ALLERGIES:   Allergies  Allergen Reactions   Codeine Hives   Atorvastatin Other (See Comments)    Rash    Rosuvastatin Hives   Varenicline Tartrate Other (See Comments)    Bad dreams     CURRENT MEDICATIONS:   No current facility-administered medications for this encounter.    REVIEW OF SYSTEMS:   [X]  denotes positive finding, [ ]  denotes negative finding Cardiac  Comments:  Chest pain or chest pressure:    Shortness of  breath upon exertion:    Short of breath when lying flat:    Irregular heart rhythm:        Vascular    Pain in calf, thigh, or hip brought on by ambulation:    Pain in feet at night that wakes you up from your sleep:     Blood clot in your veins:    Leg swelling:         Pulmonary    Oxygen at home:    Productive cough:     Wheezing:         Neurologic    Sudden weakness in arms or legs:     Sudden numbness in arms or legs:     Sudden onset of difficulty speaking or slurred speech:    Temporary loss of vision in one eye:     Problems with dizziness:         Gastrointestinal    Blood in stool:     Vomited blood:         Genitourinary    Burning when urinating:     Blood in urine:        Psychiatric    Major depression:         Hematologic  Bleeding problems:    Problems with blood clotting too easily:        Skin    Rashes or ulcers:        Constitutional    Fever or chills:      PHYSICAL EXAM:     GENERAL: The patient is a well-nourished female, in no acute distress. The vital signs are documented above. CARDIAC: There is a regular rate and rhythm.  VASCULAR: Carotid bifurcation was evaluated with SonoSite and found to be in an adequate location for BATWIRE.  The facial vein was prominent at the level of the bifurcation. PULMONARY: Non-labored respirations ABDOMEN: Soft and non-tender with normal pitched bowel sounds.  MUSCULOSKELETAL: There are no major deformities or cyanosis. NEUROLOGIC: No focal weakness or paresthesias are detected. SKIN: There are no ulcers or rashes noted. PSYCHIATRIC: The patient has a normal affect.  STUDIES:   CT angiogram: Aortic atherosclerosis.   Scattered atherosclerotic plaque of the common carotid arteries but without stenosis.   Soft and calcified plaque at both carotid bifurcations and internal carotid artery bulbs. 10% proximal ICA stenosis on the right. 30% proximal ICA stenosis on the left.    MEDICAL  ISSUES:   I discussed with the patient that she would be a good candidate for the [facility] research study for Barostim activation therapy.  I discussed the details of the procedure.  All questions were answered and she wishes to proceed.    Leia Alf, MD, FACS [facility] [phone] Pager [phone]

## 2021-06-15 NOTE — Anesthesia Procedure Notes (Signed)
Procedure Name: Intubation Date/Time: 06/15/2021 8:50 AM Performed by: Inda Coke, CRNA Pre-anesthesia Checklist: Patient identified, Emergency Drugs available, Suction available and Patient being monitored Patient Re-evaluated:Patient Re-evaluated prior to induction Oxygen Delivery Method: Circle System Utilized Preoxygenation: Pre-oxygenation with 100% oxygen Induction Type: IV induction Ventilation: Mask ventilation without difficulty Laryngoscope Size: Mac and 3 Grade View: Grade I Tube type: Oral Tube size: 7.5 mm Number of attempts: 1 Airway Equipment and Method: Stylet and Oral airway Placement Confirmation: ETT inserted through vocal cords under direct vision, positive ETCO2 and breath sounds checked- equal and bilateral Secured at: 21 cm Tube secured with: Tape Dental Injury: Teeth and Oropharynx as per pre-operative assessment

## 2021-06-15 NOTE — Anesthesia Preprocedure Evaluation (Addendum)
Anesthesia Evaluation  Patient identified by MRN, date of birth, ID band Patient awake    Reviewed: Allergy & Precautions, H&P , NPO status , Patient's Chart, lab work & pertinent test results, reviewed documented beta blocker date and time   Airway Mallampati: II  TM Distance: >3 FB Neck ROM: Full    Dental no notable dental hx. (+) Teeth Intact, Dental Advisory Given   Pulmonary COPD, former smoker,    Pulmonary exam normal breath sounds clear to auscultation       Cardiovascular hypertension, Pt. on medications and Pt. on home beta blockers + CAD, + Past MI, + CABG and +CHF  + Cardiac Defibrillator  Rhythm:Regular Rate:Normal     Neuro/Psych Anxiety Depression negative neurological ROS     GI/Hepatic negative GI ROS, Neg liver ROS,   Endo/Other  negative endocrine ROS  Renal/GU negative Renal ROS  negative genitourinary   Musculoskeletal  (+) Arthritis , Osteoarthritis,    Abdominal   Peds  Hematology negative hematology ROS (+)   Anesthesia Other Findings   Reproductive/Obstetrics negative OB ROS                            Anesthesia Physical Anesthesia Plan  ASA: 3  Anesthesia Plan: General   Post-op Pain Management: Tylenol PO (pre-op)   Induction: Intravenous  PONV Risk Score and Plan: 4 or greater and Ondansetron, Dexamethasone and Treatment may vary due to age or medical condition  Airway Management Planned: Oral ETT  Additional Equipment: Arterial line  Intra-op Plan:   Post-operative Plan: Extubation in OR  Informed Consent: I have reviewed the patients History and Physical, chart, labs and discussed the procedure including the risks, benefits and alternatives for the proposed anesthesia with the patient or authorized representative who has indicated his/her understanding and acceptance.     Dental advisory given  Plan Discussed with: CRNA  Anesthesia Plan  Comments:         Anesthesia Quick Evaluation

## 2021-06-15 NOTE — Op Note (Signed)
SURGEON:  Durene Cal MD   CO-SURGEON: Dr. Johney Frame     PREPROCEDURE DIAGNOSES:   1. Ischemic cardiomyopathy.       2. New York Heart Association class III, heart failure chronically.       POSTPROCEDURE DIAGNOSES:   1. Ischemic cardiomyopathy.     2. New York Heart Association class III, heart failure chronically.    PROCEDURES:    1. Baroreflexive activation therapy (Barostim) implant using Batwire technique  2. Carotid ultrasound  3. Defibrillator interrogation and reprogramming       INTRODUCTION:  Latoya Adams is a 76 y.o. female with medical history of ischemic cardiomyopathy, NYHA Class III CHF, and prior ICD implantation who presents today for baroreflexive activation therapy using Batwire technique.  The patient does not meet criteria for CRT due to narrow QRS.  Medical therapy has been optimized. The patient has been enrolled in the [facility] trial.    DESCRIPTION OF PROCEDURE:  Informed written consent was obtained and the patient was brought to the Electrophysiology lab in the fasting state. The patient received general anesthesia as sedation for the procedure today.  [name] right neck and chest were prepped and draped in the usual sterile fashion by the cath lab staff.  A time out was called and antibiotics were administered.  Ultrasound was used to evaluate the carotid bifurcation which was marked on the skin surface with a ink pen.  The depth of bifurcation was 0.8 cm.   A longitudinal 9 mm incision was made 2 cm distal to the carotid bifurcation. Through this incision the 18-gauge needle was inserted.  This was tracked with ultrasound in both a longitudinal and transverse plane.  The tip of the needle was visualized on the anterior medial aspect of the proximal internal carotid artery which is position A.  The facial vein was in very close proximity to the site of desired placement.  Great care as used to try to minimize interaction with the facial vein.  A large looped  wire was then advanced out of the needle under ultrasound visualization using the Batwire implant tool.   This was done under ultrasound guidance and the needle tip was positioned at the anterior medial aspect of the internal carotid artery at its origin.  The facial vein was gently manipulated away with the needle tip.  The needle tip was confirmed to be in good position.  The wire was advanced out the tip of the needle.  The wire was then brought out through the skin and a turning tool was placed to protect the tip.  Next, the needle was removed.  The CSL delivery tool was then used to dilate the space along the guide wire.  The CVRx Neo CSL Model 1036 (SN [id number]) electrode was positioned at the carotid bifurcation and mapping was performed in a standard fashion.    A 18 gauge needle was inserted into the infraclavicular space, and was connected to the pulse generator (IPG).  Impedance was checked and was appropriate.  Multiple electrode implantation sites were tested.  We had an appropriate hemodynamic response as well as good rebound after the therapy was stopped at the site of electrode deployment.  The clips were then removed from the delivery tool and the delivery tool was removed.  A pulse generator pocket was created along the right infraclavicular space in a standard fashion.  Hemostasis was assured with cautery.  A hemostat was used to create a tunnel between the neck incision and  the pulse generator incision.  The CSL was then delivered into the IPG pocket.  The strain relief wing was sutured to the platysma muscle with a 6-0 prolene.  The lead was then connected to the CVRx Barostim Neo IPG device model 2102 (SN [id number]).  The wounds were then irrigated with antibiotic solution.  The excess lead was placed on the medial side of the pocket.  The pulse generator was then secured to the pectoral fascia with 2 2-0 silk sutures.  The fascia was closed with 3-0 vicryl and the skin was closed with 4-0  vicryl.  The 2 neck incisions were closed with a 3-0 vicryl on the platysma and the skin. Dermabond was applied.   EBL<32ml.  There were no early apparent complications.     The patients ICD was interrogated and ICD therapies were turned on.  No interference was noted between the patients ICD and the Barostim Neo device with the Barostim Neo device at maximum output.           CONCLUSIONS:   1. Ischemic cardiomyopathy with chronic New York Heart Association class III heart failure, not a candidate for CRT  2. Successful implantation of a CVRX Barostim Neo device using Batwire technique for baroreflexive activation therapy  3. No early apparent complications.

## 2021-06-16 LAB — BASIC METABOLIC PANEL
Anion gap: 7 (ref 5–15)
BUN: 14 mg/dL (ref 8–23)
CO2: 24 mmol/L (ref 22–32)
Calcium: 8 mg/dL — ABNORMAL LOW (ref 8.9–10.3)
Chloride: 107 mmol/L (ref 98–111)
Creatinine, Ser: 0.98 mg/dL (ref 0.44–1.00)
GFR, Estimated: >60 mL/min (ref >60)
Glucose, Bld: 123 mg/dL — ABNORMAL HIGH (ref 70–99)
Potassium: 3.9 mmol/L (ref 3.5–5.1)
Sodium: 138 mmol/L (ref 135–145)

## 2021-06-16 LAB — CBC
HCT: 33.2 % — ABNORMAL LOW (ref 36.0–46.0)
Hemoglobin: 11.2 g/dL — ABNORMAL LOW (ref 12.0–15.0)
MCH: 31.9 pg (ref 26.0–34.0)
MCHC: 33.7 g/dL (ref 30.0–36.0)
MCV: 94.6 fL (ref 80.0–100.0)
Platelets: 257 10*3/uL (ref 150–400)
RBC: 3.51 MIL/uL — ABNORMAL LOW (ref 3.87–5.11)
RDW: 13.4 % (ref 11.5–15.5)
WBC: 6.7 10*3/uL (ref 4.0–10.5)
nRBC: 0 % (ref 0.0–0.2)

## 2021-06-16 LAB — LIPID PANEL
Cholesterol: 108 mg/dL (ref 0–200)
HDL: 46 mg/dL (ref >40)
LDL Cholesterol: 53 mg/dL (ref 0–99)
Total CHOL/HDL Ratio: 2.3 RATIO
Triglycerides: 45 mg/dL (ref <150)
VLDL: 9 mg/dL (ref 0–40)

## 2021-06-16 MED ORDER — TRAMADOL HCL 50 MG PO TABS: 50.0000 mg | ORAL_TABLET | Freq: Four times a day (QID) | ORAL | 0 refills | Status: DC | PRN

## 2021-06-16 MED FILL — Heparin Sod (Porcine)-NaCl IV Soln 1000 Unit/500ML-0.9%: INTRAVENOUS | Qty: 500 | Status: AC

## 2021-06-16 MED FILL — Gentamicin Sulfate Inj 40 MG/ML: INTRAMUSCULAR | Qty: 80 | Status: AC

## 2021-06-16 NOTE — Progress Notes (Signed)
PHARMACIST LIPID MONITORING   Latoya Adams is a 75 y.o. female admitted on 06/15/2021 with R neck barostim implant.  Pharmacy has been consulted to optimize lipid-lowering therapy with the indication of secondary prevention for clinical ASCVD.  Recent Labs:  Lipid Panel (last 6 months):   Lab Results  Component Value Date   CHOL 108 06/16/2021   TRIG 45 06/16/2021   HDL 46 06/16/2021   CHOLHDL 2.3 06/16/2021   VLDL 9 06/16/2021   LDLCALC 53 06/16/2021    Hepatic function panel (last 6 months):   Lab Results  Component Value Date   AST 25 04/15/2021   ALT 27 04/15/2021   ALKPHOS 83 04/15/2021   BILITOT 0.6 04/15/2021    SCr (since admission):   Serum creatinine: 0.98 mg/dL 52/84/13 2440 Estimated creatinine clearance: 49.4 mL/min  Current therapy and lipid therapy tolerance Current lipid-lowering therapy: repatha Previous lipid-lowering therapies (if applicable): atorvastatin, rosuvastatin Documented or reported allergies or intolerances to lipid-lowering therapies (if applicable): atorvastatin, rosuvastatin - hives  Assessment:   Patient is followed by lipid clinic as outpatient somewhat recently started on repatha. Intolerances noted to statins.   Plan:    1.Statin intensity (high intensity recommended for all patients regardless of the LDL):  patient intolerant to statins, will continue repatha  2.Add ezetimibe (if any one of the following):   Not indicated at this time.  3.Refer to lipid clinic:   No  4.Follow-up with:  Cardiology provider - Arvilla Meres, MD  5.Follow-up labs after discharge:  No changes in lipid therapy, repeat a lipid panel in one year.     Sheppard Coil PharmD., BCPS Clinical Pharmacist 06/16/2021 8:25 AM

## 2021-06-16 NOTE — Anesthesia Postprocedure Evaluation (Signed)
Anesthesia Post Note  Patient: Latoya Adams  Procedure(s) Performed: BAROREFLEX SYSTEM INSERTION     Patient location during evaluation: Other Anesthesia Type: General Level of consciousness: awake and alert Pain management: pain level controlled Vital Signs Assessment: post-procedure vital signs reviewed and stable Respiratory status: spontaneous breathing, nonlabored ventilation and respiratory function stable Cardiovascular status: blood pressure returned to baseline and stable Postop Assessment: no apparent nausea or vomiting Anesthetic complications: no   No notable events documented.  Last Vitals:  Vitals:   06/16/21 0400 06/16/21 0836  BP: 123/60 137/61  Pulse: 76 75  Resp: 14 18  Temp: 36.7 C 36.8 C  SpO2: 96% 97%    Last Pain:  Vitals:   06/16/21 0836  TempSrc: Oral  PainSc:                  Emony Dormer,W. EDMOND

## 2021-06-16 NOTE — Progress Notes (Signed)
Discharge instructions (including medications) discussed with and copy provided to patient/caregiver 

## 2021-06-16 NOTE — Plan of Care (Signed)
  Problem: Education: Goal: Knowledge of General Education information will improve Description: Including pain rating scale, medication(s)/side effects and non-pharmacologic comfort measures Outcome: Adequate for Discharge   

## 2021-06-16 NOTE — Research (Signed)
Post op implant   Patient gave instructions for discharge from research staff. Dr Myra Gianotti discussed travel with patient and the importance of stopping and walking during her trip to [place]. She is going to stay with her daughter until her 2 week visit.  Her follow up visit is 07/01/2021 @ 11am Implant worksheet and pre-discharge form filled out by Dr Myra Gianotti on day of discharge, please see coordinator for form.    Section A:  Administrative Section  Subject ID: _[id number] - _[id number]_ - 015 Subject Initials: _M_ _J_ _E_  Visit Interval:    []   Screening/Baseline    [x]  Activation   []  0.5 Month       []  1 Month     []  2 Month     []  3 Month       []  6 Month     []  12 Month         []  Unscheduled, reason for visit: _________________________________________  Section B:  Device Information   Battery Battery Voltage: 698 V   Battery Life: 140 Months   RRT Date: 13/Sep/2034 (DD/MMM/YYYY)  Lead Impeadance Right Lead __698___ Ohms    []  Low    []  High   Left Lead _________ Ohms    []  Low    [x]  High  Programmed Settings Pathway Pulse Width Amplitude Frequency   []  Left 125 1 40   [x]  Right     Section C:  Programming Information (12 Month and Unscheduled - not required)  Date: _11_/_Jan_/_2023__     (DD / MMM / YYYY) Device information, therapy schedule and programmed settings can be found on the session summary report  Site Clinician Present: __Kimberly Kalyiah Saintil :) ________________________  Employee helping with programming: ___Kyle _______ []  N/A  Location of CVRx person: [x]  []  Onsite Remote  Subject Experience   1. Did the subject experience transient bradycardia or hypotension during device testing? []  [x]  Yes No   2. Did the subject experience transient electrical stimulation of non-vascular tissues? []  [x]  Yes No   3. If yes to either question above, was intervention beyond reprogramming needed or was it associated with an additional untoward event? []  []  Yes No   4a.  Is there any indication that there has been unacceptable device interaction between the CVRx device and other implanted electrical stimulators or sensors device? []  [x]  []  Yes No N/A (no other device)   4b. 4b. If Yes to question 4a, please describe the device interaction and corrective action taken:                  Section D:  Arrhythmia Interventions  Has the subject received a cardiac ablation since the last study visit? []  Yes  Date of last procedure:   ____/____/_____ (DD/MMM/YYYY)   [x]  No   []  Unknown   If yes, what type and # of ablations Type []  Atrial     []  Ventricular    # of procedures: ____________   Was this pre-planned prior to CVRx implant? []  Yes    []  No (complete/update AE form)  Has the subject received a cardioversion since the last study visit? []    Yes  Date of last procedure:   ____/____/_____ (DD/MMM/YYYY)   [x]  No   []  Unknown   If yes, what type and # of cardioversions? Type []  Medications     []  Electrical Shock    # of procedures: _____________   Was this pre-planned prior to CVRx implant? []   Yes    []  No (complete/update AE form)  Section E:  Adverse Events (N/A for Implant Interval)  Have any new adverse events or updates to existing events occurred since last visit? []  Yes (complete/update AE form)   [x]  No  Section F:  Medication Changes   Have there been any changes to the subject's home use medications for Arrhythmia, Antiplatelet/Anticoagulation and Heart failure medications since the last visit? []  Yes (update Med form)   [x]  No  Section G:  Were Fluoroscopy or X-ray images done? []  Yes []  Images sent to [facility] (see Fluoroscopy/X-ray Worksheet)   [x]  No  Section H:  Comments           Section I:  Signature   Person completing form (Print Name): :) ______________________  Signature: :) _____________ Date: _01/11/2023____________    Section A:  Administrative Section  Subject ID:  - _[id number]_ - 015 Subject Initials: _M_ _J_ _E_  Visit Interval:    []   Screening/Baseline    [x]  Activation   []  0.5 Month       []  1 Month     []  2 Month     []  3 Month       []  6 Month     []  12 Month         []  Unscheduled, reason for visit: _________________________________________  Section B:  Physical Assessment   Date:    _12_/_Jan_/_2023_   (DD / MMM / YYYY)  Weight: _61.2_  [x]  kg   []  pounds Height (Screening Visit Only): ________  []  cm []  inches  Blood Pressure: _137_  / _61_mmHg Heart Rate: _75_ bpm  Section E:  Signature   Person completing form (Print Name): __Kimberly :) ___________ Signature: :) ___ Date: __01/12/2023______________      SECTION A:  Administrative Section  Patient ID: Mercer Pod - _[id number]_ - 015 Patient Initials: _M_ _J_ _E_  Visit Interval:    []   Screening/Baseline*    [x]  Activation   []  0.5 Month       []  1 Month     []  2 Month     []  3 Month       []  6 Month     []  12 Month         []  Unscheduled, reason for visit: _________________________________________  * For Screening / Baseline only the questions are based on 30 days prior to consent.  SECTION B:  COVID-19 Like Illness Symptoms   Has the subject experienced any cold, flu or COVID-19 symptoms since the last study visit? [x]   No  (skip to section C)     []  Yes, date of onset:  _____/_____ MMM/YYYY    If yes, check all symptoms that apply:   []  Fevers or chills   []   New or Worsening Cough  []  Productive  []  Dry     If yes to cough, indicate severity  []  Constant  []  Occasional, several per hour   []  New or worsened shortness of breath   []  Diarrhea   []  Altered or reduced sense of smell or taste   []  Muscle aches/Severe Fatigue   []  Chest pain or tightness   []  Sore throat   []  Nausea or vomiting  SECTION C:  COVID-19 Like Illness Testing   Has the patient been tested for COVID-19 since the last study visit? []  No     [x]  Yes, date of test:  _11_/_Jan_/_2023_     DD/MMM/YYYY    If yes, test results:    Positive  Negative  Unknown  Has the patient been tested for COVID-19 Antibodies since the last study visit?  No      Yes, date of test:  _____/_____ MMM/YYYY    If yes, test results:    Positive  Negative  Unknown  Has the patient been vaccinated for COVID-19 since the last study visit?  No      Yes, date of test:  _____/_____ MMM/YYYY  Has the patient been tested for Influenza (flu) since the last study visit?  No      Yes, date of test:  _____/_____ MMM/YYYY    If yes, test results:    Positive  Negative  Unknown  Has the patient been vaccinated for Influenza (flu) since the last study visit?  No      Yes, date of test:  _____/_____ MMM/YYYY  SECTION D:  COVID-19 Like Illness Exposure  Has the subject been told that they might have had COVID-19/have symptoms suggestive of COVID-19 since the last study visit?    No    Yes     Unknown  Has the subject been exposed to anyone with known or suspected COVID-19 since the last study visit?    No    Yes     Unknown  Has the subject been told that they might have had the flu or influenza since the last study visit?    No    Yes     Unknown  SECTION E:  Effects of COVID Pandemic on Subject's Healthcare Interactions  Since the last study visit, did the subject feel the need to go to an emergency department or hospital for their heart failure but decided not to because of concerns about COVID-19?   No    Yes    Unknown    If yes, how did they seek care (check all that apply):    Telemedicine visit  In-person  Clinic  Urgent Care    Subject did not change hospital/ER use due to COVID-19  Other, specify: ________________________  Since the last study visit, did the subject have a cardiology/HF related appointment cancelled/rescheduled due to COVID-19 pandemic?   No    Yes, how many? ___________    Unknown   Since the last study visit, did the subject have any cardiology/HF related telemedicine visit due to COVID-19 pandemic?   No    Yes, how many? ___________    Unknown  Since the last study visit, did the subject have a cardiology/HF procedure cancelled/rescheduled due to COVID-19 pandemic?   No    Yes, how many? ___________    Unknown  SECTION F:  Effects of COVID Pandemic on Subject's Medications  Since the last visit, did any of their heart failure medications change or stop, even for a short time?   If yes, enter change into medication eCRF   No    Yes, how many? ___________    Unknown    If yes, why:    Instructed by Doctor  Self-discontinued  Unknown  Other: ________________  Since the last visit, was the subject prescribed any medications for COVID-19?   No    Yes    Unknown    If yes, what medications (generic name):  SECTION G:  Effects of COVID Pandemic on Subject's Lifestyle  How has the subject's activity/exercise level changed due to COVID-19 pandemic?   No change     More activity/exercise   []  Less activity/exercise  How has the subject's smoking habits changed due to COVID-19? [x]   Does not smoke   []  No change   []  Smoke more   []  Smoke less  How has the subject's alcohol drinking habits changed due to COVID-19? [x]   Does not drink   []  No change   []  Drink more   []  Drink less  Section H:  Signature  Person completing form (Print Name): Mercer Pod :) _______________  Signature: Mercer Pod :) _____ Date: _01/12/2023__________________

## 2021-06-16 NOTE — Progress Notes (Signed)
06/16/2021 12:48 PM Discharge AVS meds taken today and those due this evening reviewed.  Follow-up appointments and when to call md reviewed.  D/C IV and TELE.  Questions and concerns addressed.   D/C home per orders.  [place]

## 2021-06-16 NOTE — Progress Notes (Addendum)
°  Progress Note    06/16/2021 7:53 AM 1 Day Post-Op  Subjective:  pain mostly at R radial a line site   Vitals:   06/16/21 0000 06/16/21 0400  BP: (!) 114/54 123/60  Pulse: 70 76  Resp: 20 14  Temp: 98.2 F (36.8 C) 98 F (36.7 C)  SpO2: 95% 96%   Physical Exam: Lungs:  non labored Incisions:  R neck and chest c/d/i Extremities:  palpable DP pulses Neurologic: A&O; CN grossly intact  CBC    Component Value Date/Time   WBC 6.7 06/16/2021 0450   RBC 3.51 (L) 06/16/2021 0450   HGB 11.2 (L) 06/16/2021 0450   HGB 11.2 09/10/2017 0952   HCT 33.2 (L) 06/16/2021 0450   HCT 32.8 (L) 09/10/2017 0952   PLT 257 06/16/2021 0450   PLT 314 09/10/2017 0952   MCV 94.6 06/16/2021 0450   MCV 87 09/10/2017 0952   MCH 31.9 06/16/2021 0450   MCHC 33.7 06/16/2021 0450   RDW 13.4 06/16/2021 0450   RDW 13.9 09/10/2017 0952   LYMPHSABS 1.1 09/10/2017 0952   MONOABS 0.7 12/03/2016 1748   EOSABS 0.2 09/10/2017 0952   BASOSABS 0.0 09/10/2017 0952    BMET    Component Value Date/Time   NA 138 06/16/2021 0450   NA 136 05/19/2021 0849   K 3.9 06/16/2021 0450   CL 107 06/16/2021 0450   CO2 24 06/16/2021 0450   GLUCOSE 123 (H) 06/16/2021 0450   BUN 14 06/16/2021 0450   BUN 14 05/19/2021 0849   CREATININE 0.98 06/16/2021 0450   CALCIUM 8.0 (L) 06/16/2021 0450   GFRNONAA >60 06/16/2021 0450   GFRAA >60 02/25/2020 1526    INR    Component Value Date/Time   INR 1.10 03/06/2017 1256     Intake/Output Summary (Last 24 hours) at 06/16/2021 0753 Last data filed at 06/16/2021 7902 Gross per 24 hour  Intake 1437.5 ml  Output 1405 ml  Net 32.5 ml     Assessment/Plan:  76 y.o. female is s/p R neck barostim implant 1 Day Post-Op   No neurological changes overnight R neck and chest incisions are unremarkable Ok for discharge home this morning.  She will follow up with the research team   Emilie Rutter, PA-C [facility] [phone] 06/16/2021 7:53  AM   I agree with the above.  Full neuro evaluation done by myself with no deficits.  She can be discharged home today  Durene Cal

## 2021-06-17 ENCOUNTER — Encounter: Payer: Self-pay | Admitting: *Deleted

## 2021-06-17 ENCOUNTER — Encounter: Payer: Self-pay | Admitting: Pharmacist

## 2021-06-17 DIAGNOSIS — Z006 Encounter for examination for normal comparison and control in clinical research program: Secondary | ICD-10-CM

## 2021-06-17 MED ORDER — KEFLEX 500 MG PO CAPS: 500.0000 mg | ORAL_CAPSULE | Freq: Three times a day (TID) | ORAL | 0 refills | Status: DC

## 2021-06-17 NOTE — Research (Signed)
Patients daughter called with redness at incision pocket, reviewed signs and symptoms with daughter for infections and daughter was instructed to call about if symptoms worsen.  Verbal orders received prescription call in to patients preferred pharmacy.   Jasmine Pang, RN BSN [facility] Cardiovascular Research & Education Direct Line: [phone]

## 2021-06-20 ENCOUNTER — Ambulatory Visit (INDEPENDENT_AMBULATORY_CARE_PROVIDER_SITE_OTHER): Payer: Medicare PPO

## 2021-06-20 DIAGNOSIS — I255 Ischemic cardiomyopathy: Secondary | ICD-10-CM

## 2021-06-20 LAB — CUP PACEART REMOTE DEVICE CHECK
Battery Remaining Longevity: 93 mo
Battery Voltage: 2.96 V
Brady Statistic RV Percent Paced: 0.01 %
Date Time Interrogation Session: 20230116022606
HighPow Impedance: 81 Ohm
Implantable Lead Implant Date: 20190418
Implantable Lead Location: 753860
Implantable Pulse Generator Implant Date: 20190418
Lead Channel Impedance Value: 361 Ohm
Lead Channel Impedance Value: 456 Ohm
Lead Channel Pacing Threshold Amplitude: 0.75 V
Lead Channel Pacing Threshold Pulse Width: 0.4 ms
Lead Channel Sensing Intrinsic Amplitude: 21.125 mV
Lead Channel Sensing Intrinsic Amplitude: 21.125 mV
Lead Channel Setting Pacing Amplitude: 2.5 V
Lead Channel Setting Pacing Pulse Width: 0.4 ms
Lead Channel Setting Sensing Sensitivity: 0.3 mV

## 2021-06-20 NOTE — Research (Signed)
Patient daughter called that the Keflex is not in stock and the prescription was marked dispense as written.  I checked with Dr Myra Gianotti to see if the generic is ok to give.  Per Dr Myra Gianotti generic is fine.  I called the pharmacy and confirmed the generic is ok per verbal order with Dr Myra Gianotti.  Patient picked up the prescription that evening, and started that night, per her daughter.    Verbal order given over phone for generic keflex 500 mg TID

## 2021-06-20 NOTE — Discharge Summary (Signed)
°  Discharge Summary  Patient ID: Latoya Adams [id number] 76 y.o. 05-22-1946  Admit date: 06/15/2021  Discharge date and time: 06/16/2021 12:53 PM   Admitting Physician: Serafina Mitchell, MD   Discharge Physician: same  Admission Diagnoses: CHF (congestive heart failure), NYHA class III, chronic, combined ([place]) [I50.42]  Discharge Diagnoses: same  Admission Condition: fair  Discharged Condition: fair  Indication for Admission: Barostim implant using Batwire technique by Dr. Trula Slade on 06/15/2021  Hospital Course: Latoya Adams is a 76 year old female who was brought in as an outpatient and underwent Barostim implant using Batwire technique by Dr. Trula Slade on 06/15/2021 as part of research for [place] Heart Association class III heart failure.  She tolerated the procedure well and was kept overnight.  POD #1 the incisions of her right neck and chest were unremarkable.  She was discharged home to follow-up with the research team in 2 weeks.  Discharge instructions were reviewed with the patient and she voiced her understanding.  Consults: None  Treatments: surgery: Barostim implant using Batwire technique by Dr. Trula Slade on 06/15/2021  Discharge Exam: See progress note 06/16/21 Vitals:   06/16/21 0400 06/16/21 0836  BP: 123/60 137/61  Pulse: 76 75  Resp: 14 18  Temp: 98 F (36.7 C) 98.2 F (36.8 C)  SpO2: 96% 97%     Disposition: Discharge disposition: 01-Home or Self Care       Patient Instructions:  Allergies as of 06/16/2021       Reactions   Codeine Hives   Atorvastatin Other (See Comments)   Rash   Rosuvastatin Hives   Varenicline Tartrate Other (See Comments)   Bad dreams        Medication List     STOP taking these medications    rosuvastatin 40 MG tablet Commonly known as: CRESTOR       TAKE these medications    aspirin 81 MG chewable tablet Chew 81 mg by mouth daily.   buPROPion 300 MG 24 hr tablet Commonly known as: WELLBUTRIN  XL Take 300 mg by mouth daily.   carvedilol 12.5 MG tablet Commonly known as: COREG TAKE 1 TABLET TWICE DAILY   CENTRUM WOMEN PO Take 1 tablet by mouth daily.   dapagliflozin propanediol 10 MG Tabs tablet Commonly known as: Farxiga Take 1 tablet (10 mg total) by mouth daily before breakfast.   Entresto 24-26 MG Generic drug: sacubitril-valsartan Take 1 tablet by mouth 2 (two) times daily.   Repatha SureClick [id number] MG/ML Soaj Generic drug: Evolocumab INJECT 1 PEN INTO THE SKIN EVERY 14 DAYS   Sertraline HCl 150 MG Caps Take 150 mg by mouth daily.   spironolactone 25 MG tablet Commonly known as: ALDACTONE TAKE 1 TABLET EVERY DAY   traMADol 50 MG tablet Commonly known as: Ultram Take 1 tablet (50 mg total) by mouth every 6 (six) hours as needed.   VITAMIN D-3 PO Take 1 tablet by mouth daily.       Activity: activity as tolerated Diet: regular diet Wound Care: keep wound clean and dry  Follow-up with Research department in 2 weeks.  Signed: Dagoberto Ligas, PA-C 06/20/2021 9:02 AM [facility] Office: [phone]

## 2021-06-30 NOTE — Progress Notes (Signed)
Remote ICD transmission.   

## 2021-07-01 ENCOUNTER — Encounter (HOSPITAL_COMMUNITY): Payer: Self-pay

## 2021-07-01 ENCOUNTER — Other Ambulatory Visit (HOSPITAL_COMMUNITY): Payer: Self-pay

## 2021-07-01 ENCOUNTER — Encounter: Payer: Medicare PPO | Admitting: *Deleted

## 2021-07-01 VITALS — BP 117/49 | HR 68 | Wt 138.0 lb

## 2021-07-01 DIAGNOSIS — Z006 Encounter for examination for normal comparison and control in clinical research program: Secondary | ICD-10-CM

## 2021-07-01 NOTE — Progress Notes (Unsigned)
Medication Samples have been provided to the patient.  Drug name: Wilder Glade       Strength: 10 mg        Qty: 4  LOT: [id number]  Exp.Date: 10/03/2023  Dosing instructions: Take 1 tablet daily  The patient has been instructed regarding the correct time, dose, and frequency of taking this medication, including desired effects and most common side effects.   Latoya Adams 11:42 AM 07/01/2021

## 2021-07-01 NOTE — Research (Signed)
2W  Patient here today for wound check and titration of device.  She is feeling over all good.  She was actually doing things the other day and said wow Im not breathing heavy.  Device was titrated and patient had no feeling at our final titration.  No med changes per patient.  Pocket site looks good no signs of infection will update AE.  Dr Myra Gianotti came down to see patient and check wound site. He was very pleased with how she is healing.  Patient said she does have some dizziness when standing up but this was happening before study, Dr [name] is aware, per patient.  Will see patient back in 2 week for another titration visit.    Section A:  Administrative Section  Subject ID: _[id number] - _[id number]_ - 015 Subject Initials: _M_ _J_ _E_  Visit Interval:    []   Screening/Baseline    []  Activation   [x]  0.5 Month       []  1 Month     []  2 Month     []  3 Month       []  6 Month     []  12 Month         []  Unscheduled, reason for visit: _________________________________________  Section B:  Physical Assessment   Date:    _27_/_Jan_/_2023_   (DD / MMM / YYYY)  Weight: _138__  []  kg    [x]  pounds Height (Screening Visit Only): ________  []  cm []  inches  Blood Pressure: _117_  / _49_mmHg Heart Rate: _68_ bpm  Section E:  Signature    Person completing form (Print Name): Mercer Pod :) ____________  Signature: Mercer Pod :)____ Date: __01/27/2023___________     Section A:  Administrative Section  Subject ID: _[id number]__ - _[id number]_ - 015 Subject Initials: _M_ _J_ _E_  Visit Interval:    []   Screening/Baseline    []  Activation   [x]  0.5 Month       []  1 Month     []  2 Month     []  3 Month       []  6 Month     []  12 Month         []  Unscheduled, reason for visit: _________________________________________  Section B:  Device Information   Battery Battery Voltage: 133 V   Battery Life: 3.10 Months   RRT Date: 23/Feb/2034 (DD/MMM/YYYY)  Lead Impeadance Right Lead _1201____ Ohms    []   Low    []  High   Left Lead _________ Ohms    []  Low    [x]  High  Programmed Settings Pathway Pulse Width Amplitude Frequency   [x]  Left 125 1.4 40   []  Right     Section C:  Programming Information (12 Month and Unscheduled - not required)  Date: _27_/_Jan_/_2023____     (DD / MMM / YYYY) Device information, therapy schedule and programmed settings can be found on the session summary report  Site Clinician Present: Mercer Pod :) ____________________  [employer] Employee helping with programming: __Kyle Sheppard Penton and Itamar Ilsar___ []  N/A  Location of [facility] person: [x]  []  Onsite Remote  Subject Experience   1. Did the subject experience transient bradycardia or hypotension during device testing? []  [x]  Yes No   2. Did the subject experience transient electrical stimulation of non-vascular tissues? [x]  []  Yes No   3. If yes to either question above, was intervention beyond reprogramming needed or was it associated with an additional untoward event? []  [x]  Yes  No   4a. Is there any indication that there has been unacceptable device interaction between the [facility] device and other implanted electrical stimulators or sensors device? []  [x]  []  Yes No N/A (no other device)   4b. 4b. If Yes to question 4a, please describe the device interaction and corrective action taken:                  Section D:  Arrhythmia Interventions  Has the subject received a cardiac ablation since the last study visit? []  Yes  Date of last procedure:   ____/____/_____ (DD/MMM/YYYY)   [x]  No   []  Unknown   If yes, what type and # of ablations Type []  Atrial     []  Ventricular    # of procedures: ____________   Was this pre-planned prior to [facility] implant? []  Yes    []  No (complete/update AE form)  Has the subject received a cardioversion since the last study visit? []    Yes  Date of last procedure:   ____/____/_____ (DD/MMM/YYYY)   [x]  No   []  Unknown   If yes, what type and # of cardioversions?  Type []  Medications     []  Electrical Shock    # of procedures: _____________   Was this pre-planned prior to [facility] implant? []  Yes    []  No (complete/update AE form)  Section E:  Adverse Events (N/A for Implant Interval)  Have any new adverse events or updates to existing events occurred since last visit? [x]  Yes (complete/update AE form)   []  No  Section F:  Medication Changes   Have there been any changes to the subject's home use medications for Arrhythmia, Antiplatelet/Anticoagulation and Heart failure medications since the last visit? []  Yes (update Med form)   [x]  No  Section G:  Were Fluoroscopy or X-ray images done? []  Yes []  Images sent to [facility] (see Fluoroscopy/X-ray Worksheet)   [x]  NoD   Section H:  Comments     N/A      Section I:  Signature   Person completing form (Print Name): :) ___________________  Signature: :)__________ Date: _01/27/2023_______________     SECTION A:  Administrative Section  Patient ID: - _[id number]_ - [id number] Patient Initials: _M_ _J_ _E_  Visit Interval:    []   Screening/Baseline*    []  Activation   [x]  0.5 Month       []  1 Month     []  2 Month     []  3 Month       []  6 Month     []  12 Month         []  Unscheduled, reason for visit: _________________________________________  * For Screening / Baseline only the questions are based on 30 days prior to consent.  SECTION B:  COVID-19 Like Illness Symptoms   Has the subject experienced any cold, flu or COVID-19 symptoms since the last study visit? [x]   No  (skip to section C)     []  Yes, date of onset:  _____/_____ MMM/YYYY    If yes, check all symptoms that apply:   []  Fevers or chills   []   New or Worsening Cough  []  Productive  []  Dry     If yes to cough, indicate severity  []  Constant  []  Occasional, several per hour   []  New or worsened shortness of breath   []  Diarrhea   []  Altered or reduced sense of smell or taste   []   Muscle aches/Severe  Fatigue    Chest pain or tightness    Sore throat    Nausea or vomiting  SECTION C:  COVID-19 Like Illness Testing   Has the patient been tested for COVID-19 since the last study visit?  No      Yes, date of test:  _____/_____ MMM/YYYY    If yes, test results:    Positive  Negative  Unknown  Has the patient been tested for COVID-19 Antibodies since the last study visit?  No      Yes, date of test:  _____/_____ MMM/YYYY    If yes, test results:    Positive  Negative  Unknown  Has the patient been vaccinated for COVID-19 since the last study visit?  No      Yes, date of test:  _____/_____ MMM/YYYY  Has the patient been tested for Influenza (flu) since the last study visit?  No      Yes, date of test:  _____/_____ MMM/YYYY    If yes, test results:    Positive  Negative  Unknown  Has the patient been vaccinated for Influenza (flu) since the last study visit?  No      Yes, date of test:  _____/_____ MMM/YYYY  SECTION D:  COVID-19 Like Illness Exposure  Has the subject been told that they might have had COVID-19/have symptoms suggestive of COVID-19 since the last study visit?    No    Yes     Unknown  Has the subject been exposed to anyone with known or suspected COVID-19 since the last study visit?    No    Yes     Unknown  Has the subject been told that they might have had the flu or influenza since the last study visit?    No    Yes     Unknown  SECTION E:  Effects of COVID Pandemic on Subject's Healthcare Interactions  Since the last study visit, did the subject feel the need to go to an emergency department or hospital for their heart failure but decided not to because of concerns about COVID-19?   No    Yes    Unknown    If yes, how did they seek care (check all that apply):    Telemedicine visit  In-person  Clinic  Urgent Care    Subject did not change hospital/ER use due to  COVID-19  Other, specify: ________________________  Since the last study visit, did the subject have a cardiology/HF related appointment cancelled/rescheduled due to COVID-19 pandemic?   No    Yes, how many? ___________    Unknown  Since the last study visit, did the subject have any cardiology/HF related telemedicine visit due to COVID-19 pandemic?   No    Yes, how many? ___________    Unknown  Since the last study visit, did the subject have a cardiology/HF procedure cancelled/rescheduled due to COVID-19 pandemic?   No    Yes, how many? ___________    Unknown  SECTION F:  Effects of COVID Pandemic on Subject's Medications  Since the last visit, did any of their heart failure medications change or stop, even for a short time?   If yes, enter change into medication eCRF   No    Yes, how many? ___________    Unknown    If yes, why:    Instructed by Doctor  Self-discontinued  Unknown  Other: ________________  Since the last visit, was the subject prescribed any  medications for COVID-19? [x]   No   []  Yes   []  Unknown    If yes, what medications (generic name):  SECTION G:  Effects of COVID Pandemic on Subject's Lifestyle  How has the subject's activity/exercise level changed due to COVID-19 pandemic? [x]   No change   []  More activity/exercise   []  Less activity/exercise  How has the subject's smoking habits changed due to COVID-19? [x]   Does not smoke   []  No change   []  Smoke more   []  Smoke less  How has the subject's alcohol drinking habits changed due to COVID-19? []   Does not drink   [x]  No change   []  Drink more   []  Drink less  Section H:  Signature  Person completing form (Print Name): __Kimberly :) ___________________ Signature: :) _____ Date: __01/27/2023_____________                   Current Outpatient Medications:    aspirin 81 MG chewable tablet, Chew 81 mg by mouth daily., Disp: , Rfl:     carvedilol (COREG) 12.5 MG tablet, TAKE 1 TABLET TWICE DAILY, Disp: 180 tablet, Rfl: 3   Cholecalciferol (VITAMIN D-3 PO), Take 1 tablet by mouth daily., Disp: , Rfl:    dapagliflozin propanediol (FARXIGA) 10 MG TABS tablet, Take 1 tablet (10 mg total) by mouth daily before breakfast., Disp: 90 tablet, Rfl: 3   Multiple Vitamins-Minerals (CENTRUM WOMEN PO), Take 1 tablet by mouth daily., Disp: , Rfl:    REPATHA SURECLICK 140 MG/ML SOAJ, INJECT 1 PEN INTO THE SKIN EVERY 14 DAYS, Disp: 2 mL, Rfl: 11   sacubitril-valsartan (ENTRESTO) 24-26 MG, Take 1 tablet by mouth 2 (two) times daily., Disp: 60 tablet, Rfl: 3   spironolactone (ALDACTONE) 25 MG tablet, TAKE 1 TABLET EVERY DAY, Disp: 90 tablet, Rfl: 1   buPROPion (WELLBUTRIN XL) 300 MG 24 hr tablet, Take 300 mg by mouth daily., Disp: , Rfl:    sertraline (ZOLOFT) 100 MG tablet, TAKE 1 TABLET(100 MG) BY MOUTH DAILY IN THE MORNING, Disp: , Rfl:    Sertraline HCl 150 MG CAPS, Take 150 mg by mouth daily., Disp: , Rfl:    traMADol (ULTRAM) 50 MG tablet, Take 1 tablet (50 mg total) by mouth every 6 (six) hours as needed. (Patient not taking: Reported on 07/01/2021), Disp: 20 tablet, Rfl: 0

## 2021-07-08 ENCOUNTER — Other Ambulatory Visit (HOSPITAL_COMMUNITY): Payer: Self-pay

## 2021-07-08 ENCOUNTER — Telehealth (HOSPITAL_COMMUNITY): Payer: Self-pay | Admitting: Pharmacy Technician

## 2021-07-08 NOTE — Telephone Encounter (Signed)
Advanced Heart Failure Patient Advocate Encounter  The patient was approved for a [employer] HF grant that will help cover the cost of Farxiga.   Member ID: [id number] Group ID: [id number] RxBin ID: [id number] PCN: [id number] Eligibility Start Date: 07/08/2021 Eligibility End Date: 07/07/2022 Assistance Amount: $1,200.00  Will forego applying for [place] assistance at this time.

## 2021-07-21 ENCOUNTER — Encounter: Payer: Medicare PPO | Admitting: *Deleted

## 2021-07-21 ENCOUNTER — Encounter (INDEPENDENT_AMBULATORY_CARE_PROVIDER_SITE_OTHER): Payer: Self-pay | Admitting: Otolaryngology

## 2021-07-21 VITALS — BP 97/46 | HR 76 | Wt 138.0 lb

## 2021-07-21 DIAGNOSIS — Z006 Encounter for examination for normal comparison and control in clinical research program: Secondary | ICD-10-CM

## 2021-07-21 NOTE — Research (Signed)
[employer] 2M  Patient doing well, no complaints of shortness of breath or chest pain.  Meds reviewed and verified by patient.  Patient said that she felt like a bobble head when watching TV, and no other times. She said that the dizziness has gone away.  We titrated the device and made adjustments, no stim was felt during titration. BP and HR stayed stable.  Patient felt good and Bp was stable.  Patient will come back in a month for follow up and will call if any symptoms.    Section A:  Administrative Section  Subject ID: _[id number]__ - _024_ - 015 Subject Initials: _M__ _J__ _E__  Visit Interval:      Screening/Baseline     Activation    0.5 Month        1 Month      2 Month      3 Month        6 Month      12 Month          Unscheduled, reason for visit: _________________________________________  Section B:  Device Information   Battery Battery Voltage: 3.06 V   Battery Life: 91 Months   RRT Date: 25-Jan-2029 (DD/MMM/YYYY)  Lead [employer] Right Lead 1031 Ohms     Low     High   Left Lead _______ Ohms     Low     High  Programmed Settings Pathway Pulse Width Amplitude Frequency    Left 65 5.2 40    Right     Section C:  Programming Information (12 Month and Unscheduled - not required)  Date: _16_/_Feb_/_2023__     (DD / MMM / YYYY) Device information, therapy schedule and programmed settings can be found on the session summary report  Site Clinician Present: Mercer Pod :) ______________________  [employer] Employee helping with programming: Eather Colas, and Kyle Wolf_  N/A  Location of CVRx person:   Onsite Remote  Subject Experience   1. Did the subject experience transient bradycardia or hypotension during device testing?   Yes No   2. Did the subject experience transient electrical stimulation of non-vascular tissues?   Yes No   3. If yes to either question above, was intervention beyond reprogramming needed or was it  associated with an additional untoward event?   Yes No   4a. Is there any indication that there has been unacceptable device interaction between the CVRx device and other implanted electrical stimulators or sensors device?    Yes No N/A (no other device)   4b. 4b. If Yes to question 4a, please describe the device interaction and corrective action taken:                  Section D:  Arrhythmia Interventions  Has the subject received a cardiac ablation since the last study visit?  Yes  Date of last procedure:   ____/____/_____ (DD/MMM/YYYY)    No    Unknown   If yes, what type and # of ablations Type  Atrial      Ventricular    # of procedures: ____________   Was this pre-planned prior to CVRx implant?  Yes     No (complete/update AE form)  Has the subject received a cardioversion since the last study visit?    Yes  Date of last procedure:   ____/____/_____ (DD/MMM/YYYY)    No    Unknown   If yes, what type and # of cardioversions? Type  Medications       Electrical Shock    # of procedures: _____________   Was this pre-planned prior to CVRx implant? []  Yes    []  No (complete/update AE form)  Section E:  Adverse Events (N/A for Implant Interval)  Have any new adverse events or updates to existing events occurred since last visit? [x]  Yes (complete/update AE form)   []  No  Section F:  Medication Changes   Have there been any changes to the subject's home use medications for Arrhythmia, Antiplatelet/Anticoagulation and Heart failure medications since the last visit? []  Yes (update Med form)   [x]  No  Section G:  Were Fluoroscopy or X-ray images done? []  Yes []  Images sent to [facility] (see Fluoroscopy/X-ray Worksheet)   [x]  NoD   Section H:  Comments     N/A      Section I:  Signature   Person completing form (Print Name): __Kimberly Tyia Binford :) ________________________  Signature: :) _________ Date:  _02/16/2023_________________________     Section A:  Administrative Section  Subject ID: _[id number]_ - __024_ - 015 Subject Initials: _M__ _J__ _E__  Visit Interval:    []   Screening/Baseline    []  Activation   []  0.5 Month       [x]  1 Month     []  2 Month     []  3 Month       []  6 Month     []  12 Month         []  Unscheduled, reason for visit: _________________________________________  Section B:  Physical Assessment   Date:    _16_/_Feb_/_2023______   (DD / MMM / YYYY)  Weight: _138_  []  kg    [x]  pounds Height (Screening Visit Only): ________  []  cm []  inches  Blood Pressure: _97_  / _46_mmHg Heart Rate: __76________ bpm  Section E:  Signature    Person completing form (Print Name): :)______________ Signature: :) ____ Date: _02/16/2023____________________      SECTION A:  Administrative Section  Patient ID: - _[id number]_ - [id number] Patient Initials: _M_ _J_ _E_  Visit Interval:    []   Screening/Baseline*    []  Activation   []  0.5 Month       [x]  1 Month     []  2 Month     []  3 Month       []  6 Month     []  12 Month         []  Unscheduled, reason for visit: _________________________________________  * For Screening / Baseline only the questions are based on 30 days prior to consent.  SECTION B:  COVID-19 Like Illness Symptoms   Has the subject experienced any cold, flu or COVID-19 symptoms since the last study visit? [x]   No  (skip to section C)     []  Yes, date of onset:  _____/_____ MMM/YYYY    If yes, check all symptoms that apply:   []  Fevers or chills   []   New or Worsening Cough  []  Productive  []  Dry     If yes to cough, indicate severity  []  Constant  []  Occasional, several per hour   []  New or worsened shortness of breath   []  Diarrhea   []  Altered or reduced sense of smell or taste   []  Muscle aches/Severe Fatigue   []  Chest pain or tightness   []  Sore throat   []  Nausea or vomiting  SECTION C:  COVID-19 Like Illness Testing    Has  the patient been tested for COVID-19 since the last study visit? [x]  No     []  Yes, date of test:  _____/_____ MMM/YYYY    If yes, test results:   []  Positive []  Negative []  Unknown  Has the patient been tested for COVID-19 Antibodies since the last study visit? [x]  No     []  Yes, date of test:  _____/_____ MMM/YYYY    If yes, test results:   []  Positive []  Negative []  Unknown  Has the patient been vaccinated for COVID-19 since the last study visit? [x]  No     []  Yes, date of test:  _____/_____ MMM/YYYY  Has the patient been tested for Influenza (flu) since the last study visit? [x]  No     []  Yes, date of test:  _____/_____ MMM/YYYY    If yes, test results:   []  Positive []  Negative []  Unknown  Has the patient been vaccinated for Influenza (flu) since the last study visit? [x]  No     []  Yes, date of test:  _____/_____ MMM/YYYY  SECTION D:  COVID-19 Like Illness Exposure  Has the subject been told that they might have had COVID-19/have symptoms suggestive of COVID-19 since the last study visit? [x]    No   []  Yes   []   Unknown  Has the subject been exposed to anyone with known or suspected COVID-19 since the last study visit? [x]    No   []  Yes   []   Unknown  Has the subject been told that they might have had the flu or influenza since the last study visit? [x]    No   []  Yes   []   Unknown  SECTION E:  Effects of COVID Pandemic on Subject's Healthcare Interactions  Since the last study visit, did the subject feel the need to go to an emergency department or hospital for their heart failure but decided not to because of concerns about COVID-19? [x]   No   []  Yes   []  Unknown    If yes, how did they seek care (check all that apply):   []  Telemedicine visit []  In-person []  Clinic []  Urgent Care   []  Subject did not change hospital/ER use due to COVID-19 []  Other, specify: ________________________  Since the last study visit, did the subject have a cardiology/HF related  appointment cancelled/rescheduled due to COVID-19 pandemic? [x]   No   []  Yes, how many? ___________   []  Unknown  Since the last study visit, did the subject have any cardiology/HF related telemedicine visit due to COVID-19 pandemic? [x]   No   []  Yes, how many? ___________   []  Unknown  Since the last study visit, did the subject have a cardiology/HF procedure cancelled/rescheduled due to COVID-19 pandemic? [x]   No   []  Yes, how many? ___________   []  Unknown  SECTION F:  Effects of COVID Pandemic on Subject's Medications  Since the last visit, did any of their heart failure medications change or stop, even for a short time?   If yes, enter change into medication eCRF [x]   No   []  Yes, how many? ___________   []  Unknown    If yes, why:   []  Instructed by Doctor []  Self-discontinued []  Unknown []  Other: ________________  Since the last visit, was the subject prescribed any medications for COVID-19? [x]   No   []  Yes   []  Unknown    If yes, what medications (generic name):  SECTION G:  Effects of COVID Pandemic on Subject's  Lifestyle  How has the subject's activity/exercise level changed due to COVID-19 pandemic? [x]   No change   []  More activity/exercise   []  Less activity/exercise  How has the subject's smoking habits changed due to COVID-19? [x]   Does not smoke   []  No change   []  Smoke more   []  Smoke less  How has the subject's alcohol drinking habits changed due to COVID-19? [x]   Does not drink   []  No change   []  Drink more   []  Drink less  Section H:  Signature  Person completing form (Print Name): __Kimberly :) _________________  Signature: :) ____ Date: __02/16/2023__________________    Current Outpatient Medications:    aspirin 81 MG chewable tablet, Chew 81 mg by mouth daily., Disp: , Rfl:    buPROPion (WELLBUTRIN XL) 300 MG 24 hr tablet, Take 300 mg by mouth daily., Disp: , Rfl:    carvedilol (COREG) 12.5 MG tablet, TAKE 1 TABLET TWICE  DAILY, Disp: 180 tablet, Rfl: 3   Cholecalciferol (VITAMIN D-3 PO), Take 1 tablet by mouth daily., Disp: , Rfl:    dapagliflozin propanediol (FARXIGA) 10 MG TABS tablet, Take 1 tablet (10 mg total) by mouth daily before breakfast., Disp: 90 tablet, Rfl: 3   Multiple Vitamins-Minerals (CENTRUM WOMEN PO), Take 1 tablet by mouth daily., Disp: , Rfl:    REPATHA SURECLICK 140 MG/ML SOAJ, INJECT 1 PEN INTO THE SKIN EVERY 14 DAYS, Disp: 2 mL, Rfl: 11   sacubitril-valsartan (ENTRESTO) 24-26 MG, Take 1 tablet by mouth 2 (two) times daily., Disp: 60 tablet, Rfl: 3   sertraline (ZOLOFT) 100 MG tablet, TAKE 1 TABLET(100 MG) BY MOUTH DAILY IN THE MORNING, Disp: , Rfl:    spironolactone (ALDACTONE) 25 MG tablet, TAKE 1 TABLET EVERY DAY, Disp: 90 tablet, Rfl: 1

## 2021-07-21 NOTE — Progress Notes (Signed)
Section A:  Administrative Section  Subject ID: _[id number]_ - __024___ - 015 Subject Initials: __mjk_ ___ ___ Visit Interval:  (* = as required) []   Baseline     []  Implant/Pre D/C  Date of Report:   _16____/__feb_____/___2023____       (DD / MMM / Annamarie Major)  [x]   1 Month     []  3 Month*  Name of ENT ___John Jearld Fenton, MD_________  []   6 Month*     []  12 Month*     []  Unscheduled*  Directions:  Baseline: complete sections B-E only   All other visits, complete all sections below (B-G)   Use comments box to document any abnormalities or changes from Baseline  Was the CV[facility] device turned off (required at pre-discharge & 1 month)? [x]  Yes   []  No, specify reason why not: __________________________________  Was a laryngoscope used (required at pre-discharge)? [x]  Yes   []  No  Section B:  Dysphonia   Global Rating of Dysphonia: (Absence of recent upper respiratory infection or recent extensive voice abuse)   [x]   Normal (or clear)   []   Mild   []   Mild to Moderate   []   Moderate   []   Moderate to Severe   []   Severe   []   Aphonic  Section C:  Tongue/Pharynx   Assessment Observation Severity (complete only if abnormal); defined as:    Mild Minimal impairment of functioning; patient is able to carry out usual activities.    Moderate Patient experiences sufficient discomfort to interfere with or reduce their usual level of activity.    Severe Clinically evident impairment of functioning; patient is unable to carry out usual activities.  Pharynx at Rest [x]   Symmetric []   Mild   []  Asymmetric []  Moderate     []  Severe  Pharynx with Phonation (ah) [x]   Symmetric []   Mild   []  Asymmetric []  Moderate     []  Severe  Tongue Atrophy [x]   Not Present []   Mild   []  Present []  Moderate     []  Severe  Tongue Mobility [x]   Normal []   Mild   []  Reduced []  Moderate     []  Severe  Tongue Protrusion [x]   Midline []   Mild Side: ____________   []  Deviation []  Moderate      []  Severe   Sensation of the Pharynx  [x]   Normal []   Mild   []  Decreased []  Moderate     []  Severe  Section D:  House-Brackman Facial Paralysis Scale (Circle One)  Grade Impairment  I  [x]  Normal  II Mild Dysfunction (slight weakness, normal symmetry at rest)  III Moderate Dysfunction (obvious but not disfiguring weakness with synkinesis, normal symmetry at rest); Complete eye closure with maximal effort; Good forehead movement  IV Moderately severe dysfunction (obvious and disfiguring asymmetry, significant synkinesis); Incomplete eye closure; moderate forehead movement  V Severe dysfunction (barely perceptible motion  VI Total paralysis (no movement)  Section E:  Baseline Cranial Nerve Assessment (for comparison to pre D/C assessment)  Cranial Nerve V (Trigeminal Nerve) Ask subject to open mouth.  Any deviation to the left or right? Ask subject to move jaw side to side.  Does subject have difficulty moving jaw to either side?  Ask subject to clench their teeth.  Does the subject feel any weakness on either side?  []  Yes to any, specify:   ____________________________  [x]  No to all  Marginal mandibular branch of cranial nerve VII (Facial Nerve)  Ask the subject to sit and look straight ahead.  Is there any eye droop, tremors, tics, lip corner droop, drooling, asymmetry or expressionless face?  Ask the subject to show their teeth while frowning.  Are there deviations to one side? []  Yes to any, specify:   ____________________________  [x]  No to all  Cranial nerve IX (Glossopharyngeal Nerve) and Cranial nerve X (Vagus Nerve) Ask subject to say ah.  Did the soft palate fail to elevate or was there a deviation of the uvula to the right or left?  Ask the subject to say ahhhh as long possible.  Did the subject have any abnormal voicing, hoarseness, weakness or coughing? []  Yes to any, specify:   ____________________________  [x]  No to all  Cranial nerve XI (Accessory Nerve) Ask the subject to maintain a turned head  position against your hand while you try to push the head back to the midline.  Did the subject have any weakness? Ask the subject to push their head forward against your hand while you push the head backwards.  Was the subject unable to push their head forward? Ask the subject to shrug their shoulders.  Was the subject unable to do so in a symmetric manner? []  Yes to any, specify:   ____________________________  [x]  No to all  Cranial nerve XII (Hypoglossal Nerve) Ask the subject to stick out their tongue.  Was the subject unable to protrude their tongue past their lips, or was there any deviation? Ask the subject to move their tongue from side to side.  Was there any inability to move to the left or right? Ask the subject to push their tongue against a tongue depressor.  Was there any weakness? []  Yes to any, specify:   ____________________________  [x]  No to all  Other observed cranial nerve dysfunction []  Yes, specify:  __________________________  [x]  No  Section F:  Baseline Cranial Nerve Damage  Does the subject have a presence of baseline cranial nerve dysfunction []  Yes, specify what dysfunction: _____________________________________   [x]  No  Section G:  ENT Notes (Pre-D/C and Follow-Up)  Were changes in Dysphonia from baseline or any abnormalities related to the [facility] Implant? []   Yes   []  No   []  Unknown   [x]  Not Applicable (no changes from baseline)  Were changes in Tongue/Pharynx from baseline or any abnormalities related to the [facility] Implant? []   Yes   []  No   []  Unknown   [x]  Not Applicable (no changes from baseline)  Were changes in House-Brackman Facial Paralysis Scale from baseline or any abnormalities related to the [facility] Implant? []   Yes   []  No   []  Unknown   [x]  Not Applicable (no changes from baseline)  Were any of the above changes from baseline potentially related to the intubation for the procedure? []   Yes   []  No   []  Unknown   [x]  Not  Applicable (no changes from baseline)  Section H:  Cranial Nerves  Has there been new cranial nerve dysfunction since Baseline?   Cranial nerve V  (Trigeminal Nerve) []  Yes, specify what changed: _________________________________   [x]  No  Marginal mandibular branch of cranial nerve VII  (Facial Nerve) []  Yes, specify what changed: _________________________________   [x]  No  Cranial nerve IX (Glossopharyngeal Nerve) []  Yes, specify what changed: _________________________________   [x]  No  Cranial nerve X  (Vagus Nerve) []  Yes, specify what changed: _________________________________   [x]  No  Cranial nerve XI  (Accessory  Nerve) []  Yes, specify what changed: _________________________________   [x]  No  Cranial nerve XII  (Hypoglossal Nerve) []  Yes, specify what changed: _________________________________   [x]  No  Section I:  Comments/Notes/Describe any abnormalities/deficiencies (as well if post-operative change)            Section J:  Signature   Signature of ENT assessing subject: ____John , MD_____  ____________________ (Date)

## 2021-08-18 ENCOUNTER — Other Ambulatory Visit (HOSPITAL_COMMUNITY): Payer: Self-pay | Admitting: Cardiology

## 2021-08-18 MED ORDER — ENTRESTO 24-26 MG PO TABS: 1.0000 | ORAL_TABLET | Freq: Two times a day (BID) | ORAL | 3 refills | Status: DC

## 2021-08-18 NOTE — Telephone Encounter (Signed)
Advanced Heart Failure Patient Advocate Encounter ? ?[name] PAN grant has a balance of $[id number]. This will not take her through the whole year. The patient was approved for a [employer] that will help cover the cost of [place]. Total amount awarded, $10,000. 07/19/21-07/18/22. ? ?ID [id number] ?  ?BIN [id number] ?  ?PCN [id number] ?  ?GROUP [id number] ? ?Sent patient copy of grant via email. Sent 90 day RX request to Chantel (CMA) to send to [employer]. ? ?Archer Asa, CPhT ?  ? ?

## 2021-08-19 ENCOUNTER — Encounter: Payer: Medicare PPO | Admitting: *Deleted

## 2021-08-19 VITALS — BP 102/58 | HR 75 | Wt 142.0 lb

## 2021-08-19 DIAGNOSIS — Z006 Encounter for examination for normal comparison and control in clinical research program: Secondary | ICD-10-CM

## 2021-08-19 NOTE — Research (Signed)
Patient here today for her 49M visit with [facility].  ?Doing fairly well since last visit.  ?Took a fall a few weeks ago and is still a little sore. She did go to the ER but didn't stay.  ?No sob or chest pains. No meds changes.  ?Her weight is up a bit today. No swelling in her ankles or legs, her abdomen feels fuller.  ?We checked her optivol today to see if any fluid and there was none.  ?Device was checked and titrated today.  ?Will see her back in 1 month.  ? ? ?Section A:  Administrative Section  ?Subject ID: _[id number]__ - _025_ - 015 Subject Initials: _M_ _J_ _E_  ?Visit Interval:    []   Screening/Baseline    []  Activation   []  0.5 Month      ? []  1 Month     [x]  2 Month     []  3 Month      ? []  6 Month     []  12 Month        ? []  Unscheduled, reason for visit: _________________________________________  ?Section B:  Device Information   ?Battery Battery Voltage: 3 V  ? Battery Life: 78.2 Months  ? RRT Date: 23-Feb-2028 (DD/MMM/YYYY)  ?Lead ?[employer] Right Lead 924 Ohms  ?  []  Low  ?  []  High  ? Left Lead _____ Ohms  ?  []  Low  ?  [x]  High  ?Programmed Settings Pathway Pulse Width Amplitude Frequency  ? []  Left 65 6.6 40  ? [x]  Right     ?Section C:  Programming Information (12 Month and Unscheduled - not required)  ?Date: _17_/_Mar_/_2023_    ? (DD / MMM / YYYY) Device information, therapy schedule and programmed settings can be found on the session summary report  ?Site Clinician Present: :)__________________________  ?[employer] Employee helping with programming: __Kyle and Itamar Ilsar______ []  N/A  ?Location of [facility] person: [x]  ?[]  Onsite ?Remote  ?Subject Experience  ? 1. Did the subject experience transient bradycardia or hypotension during device testing? []  ?[x]  Yes ?No  ? 2. Did the subject experience transient electrical stimulation of non-vascular tissues? []  ?[x]  Yes ?No  ? 3. If yes to either question above, was intervention beyond reprogramming needed or was it associated with an  additional untoward event? []  ?[]  Yes ?No  ? 4a. Is there any indication that there has been unacceptable device interaction between the [employer] device and other implanted electrical stimulators or sensors device? []  ?[x]  ?[]  Yes ?No ?N/A (no other device)  ? 4b. 4b. If ?Yes? to question 4a, please describe the device interaction and corrective action taken:  ?    ?    ?    ?    ?Section D:  Arrhythmia Interventions  ?Has the subject received a cardiac ablation since the last study visit? []  Yes  Date of last procedure:  ? ?____/____/_____ ?(DD/MMM/YYYY)  ? [x]  No  ? []  Unknown  ? If yes, what type and # of ablations Type []  Atrial  ?   []  Ventricular  ?  # of procedures: ____________  ? Was this pre-planned prior to [employer] implant? []  Yes  ?  []  No (complete/update AE form)  ?Has the subject received a cardioversion since the last study visit? []    Yes  Date of last procedure:  ? ?____/____/_____ ?(DD/MMM/YYYY)  ? [x]  No  ? []  Unknown  ? If yes, what type and # of cardioversions?  Type []  Medications  ?   []  Electrical Shock  ?  # of procedures: _____________  ? Was this pre-planned prior to [employer] implant? []  Yes  ?  []  No (complete/update AE form)  ?Section E:  Adverse Events (N/A for Implant Interval)  ?Have any new adverse events or updates to existing events occurred since last visit? [x]  Yes (complete/update AE form)  ? []  No  ?Section F:  Medication Changes   ?Have there been any changes to the subject's home use medications for Arrhythmia, Antiplatelet/Anticoagulation and Heart failure medications since the last visit? []  Yes (update Med form)  ? [x]  No  ?Section G:  Were Fluoroscopy or X-ray images done? []  Yes []  Images sent to [facility] (see Fluoroscopy/X-ray Worksheet)  ? [x]  NoD   ?Section H:  Comments  ? ?  ?N/a  ?  ?  ?Section I:  Signature  ? ?Person completing form (Print Name): :) ___________ ? ?Signature: :) ____ Date: __03/17/2023_____________________ ?  ? ? ?Section  A:  Administrative Section  ?Subject ID: - _[id number]_ - 015 Subject Initials: _M_ _J_ _E_  ?Visit Interval:    []   Screening/Baseline    []  Activation   []  0.5 Month      ? []  1 Month     [x]  2 Month     []  3 Month      ? []  6 Month     []  12 Month        ? []  Unscheduled, reason for visit: _________________________________________  ?Section B:  Physical Assessment   ?Date:    _17_/_Mar_/_2023______ ?  (DD / MMM / YYYY)  ?Weight: __142_  []  kg   [x]   pounds Height (Screening Visit Only): ________  []  cm []  inches  ?Blood Pressure: _102_  / _58_mmHg Heart Rate: _75__ bpm  ?Section E:  Signature   ? ?Person completing form (Print Name): :) ______________ ? ?Signature: :) ____ Date: __03/17/2023________________________ ?  ? ? ?SECTION A:  Administrative Section  ?Patient ID: - _[id number]_ - 015 Patient Initials: _M_ _J_ _E_  ?Visit Interval:    []   Screening/Baseline*    []  Activation   []  0.5 Month      ? []  1 Month     [x]  2 Month     []  3 Month      ? []  6 Month     []  12 Month        ? []  Unscheduled, reason for visit: _________________________________________  ?* For Screening / Baseline only the questions are based on 30 days prior to consent.  ?SECTION B:  COVID-19 Like Illness Symptoms   ?Has the subject experienced any cold, flu or COVID-19 symptoms since the last study visit? [x]   No  (skip to section C)    ? []  Yes, date of onset:  _____/_____ ?MMM/YYYY  ?  If yes, check all symptoms that apply:  ? []  Fevers or chills  ? []  ? New or Worsening Cough ? []  Productive  []  Dry   ?  If yes to cough, indicate severity ? []  Constant  []  Occasional, several per hour  ? []  New or worsened shortness of breath  ? []  Diarrhea  ? []  Altered or reduced sense of smell or taste  ? []  Muscle aches/Severe Fatigue  ? []  Chest pain or tightness  ? []  Sore throat  ? []  Nausea or vomiting  ?SECTION C:  COVID-19 Like Illness Testing   ?Has the patient been tested for COVID-19 since the  last study visit? [x]  No    ? []  Yes, date of test:  _____/_____ ?MMM/YYYY  ?  If yes, test results:  ? []  Positive []  Negative []  Unknown  ?Has the patient been tested for COVID-19 Antibodies since the last study visit? [x]  No    ? []  Yes, date of test:  _____/_____ ?MMM/YYYY  ?  If yes, test results:  ? []  Positive []  Negative []  Unknown  ?Has the patient been vaccinated for COVID-19 since the last study visit? [x]  No    ? []  Yes, date of test:  _____/_____ ?MMM/YYYY  ?Has the patient been tested for Influenza (?flu?) since the last study visit? [x]  No    ? []  Yes, date of test:  _____/_____ ?MMM/YYYY  ?  If yes, test results:  ? []  Positive []  Negative []  Unknown  ?Has the patient been vaccinated for Influenza (?flu?) since the last study visit? [x]  No    ? []  Yes, date of test:  _____/_____ ?MMM/YYYY  ?SECTION D:  COVID-19 Like Illness Exposure  ?Has the subject been told that they might have had COVID-19/have symptoms suggestive of COVID-19 since the last study visit? [x]    No  ? []  Yes  ? []   Unknown  ?Has the subject been exposed to anyone with known or suspected COVID-19 since the last study visit? [x]    No  ? []  Yes  ? []   Unknown  ?Has the subject been told that they might have had the ?flu? or influenza since the last study visit? [x]    No  ? []  Yes  ? []   Unknown  ?SECTION E:  Effects of COVID Pandemic on Subject's Healthcare Interactions  ?Since the last study visit, did the subject feel the need to go to an emergency department or hospital for their heart failure but decided not to because of concerns about COVID-19? [x]   No  ? []  Yes  ? []  Unknown  ?  If yes, how did they seek care (check all that apply):  ? []  Telemedicine visit []  In-person []  Clinic []  Urgent Care  ? []  Subject did not change hospital/ER use due to COVID-19 []  Other, specify: ________________________  ?Since the last study visit, did the subject have a cardiology/HF related appointment cancelled/rescheduled due to COVID-19  pandemic? [x]   No  ? []  Yes, how many? ___________  ? []  Unknown  ?Since the last study visit, did the subject have any cardiology/HF related telemedicine visit due to COVID-19 pandemic? [x]   No  ? []  Yes, how ma

## 2021-09-14 ENCOUNTER — Other Ambulatory Visit: Payer: Self-pay | Admitting: *Deleted

## 2021-09-14 ENCOUNTER — Encounter: Payer: Medicare PPO | Admitting: *Deleted

## 2021-09-14 ENCOUNTER — Ambulatory Visit (HOSPITAL_COMMUNITY)
Admission: RE | Admit: 2021-09-14 | Discharge: 2021-09-14 | Disposition: A | Discharge status: Home / Self Care | Payer: Medicare PPO | Source: Ambulatory Visit | Attending: Surgery | Admitting: Surgery

## 2021-09-14 VITALS — BP 116/56 | HR 71 | Wt 142.0 lb

## 2021-09-14 DIAGNOSIS — Z006 Encounter for examination for normal comparison and control in clinical research program: Secondary | ICD-10-CM

## 2021-09-14 NOTE — Progress Notes (Signed)
Order for carotid 

## 2021-09-14 NOTE — Research (Addendum)
59M visit for [facility]   Patient doing well, has some feeling when lying down and turns neck to side, not painful just feels it.  Device titration completed by Ellamae Sia.  Section A:  Administrative Section  Subject ID: _[id number]__ - _024_ - 015 Subject Initials: _M_ _J_ _E_  Visit Interval:      Screening/Baseline     Activation    0.5 Month        1 Month      2 Month      3 Month        6 Month      12 Month          Unscheduled, reason for visit: _________________________________________  Section B:  Device Information   Battery Battery Voltage: 3.0 V   Battery Life:   85 Months  updated to 70 KL :) 12/02/2021   RRT Date: 05-Oct-2028 (DD/MMM/YYYY)  Lead [employer] Right Lead _939__ Ohms     Low     High   Left Lead ____ Ohms     Low     High  Programmed Settings Pathway Pulse Width Amplitude Frequency    Left 45 7.2 40    Right     Section C:  Programming Information (12 Month and Unscheduled - not required)  Date: _12_/_Apr_/_2023_     (DD / MMM / YYYY) Device information, therapy schedule and programmed settings can be found on the session summary report  Site Clinician Present: Mercer Pod :) _________________________________  [employer] Employee helping with programming: __Itamar Conservator, museum/gallery (on site) Programme researcher, broadcasting/film/video (remote) ____  N/A  Location of [facility] person:   Onsite Remote  Subject Experience   1. Did the subject experience transient bradycardia or hypotension during device testing?   Yes No   2. Did the subject experience transient electrical stimulation of non-vascular tissues?   Yes No   3. If yes to either question above, was intervention beyond reprogramming needed or was it associated with an additional untoward event?   Yes No   4a. Is there any indication that there has been unacceptable device interaction between the [employer] device and other implanted electrical stimulators or sensors device?     Yes No N/A (no other device)   4b. 4b. If "Yes" to question 4a, please describe the device interaction and corrective action taken:                  Section D:  Arrhythmia Interventions  Has the subject received a cardiac ablation since the last study visit?  Yes  Date of last procedure:   ____/____/_____ (DD/MMM/YYYY)    No    Unknown   If yes, what type and # of ablations Type  Atrial      Ventricular    # of procedures: ____________   Was this pre-planned prior to [employer] implant?  Yes     No (complete/update AE form)  Has the subject received a cardioversion since the last study visit?    Yes  Date of last procedure:   ____/____/_____ (DD/MMM/YYYY)    No    Unknown   If yes, what type and # of cardioversions? Type  Medications      Electrical Shock    # of procedures: _____________   Was this pre-planned prior to [employer] implant?  Yes     No (complete/update AE form)  Section E:  Adverse Events (N/A for Implant Interval)  Have any new adverse events or  updates to existing events occurred since last visit? []  Yes (complete/update AE form)   [x]  No  Section F:  Medication Changes   Have there been any changes to the subject's home use medications for Arrhythmia, Antiplatelet/Anticoagulation and Heart failure medications since the last visit? []  Yes (update Med form)   [x]  No  Section G:  Were Fluoroscopy or X-ray images done? []  Yes []  Images sent to [facility] (see Fluoroscopy/X-ray Worksheet)   []  NoD   Section H:  Comments     N/A      Section I:  Signature   Person completing form (Print Name): :) _____________________  Signature: :) _______ Date: _04/12/2023_________________________     Section A:  Administrative Section  Subject ID: - _[id number]_ - [id number] Subject Initials: _M_ _J_ _E_  Visit Interval:    []   Screening/Baseline    []  Activation   []  0.5 Month       []  1 Month     []  2 Month     [x]   3 Month       []  6 Month     []  12 Month         []  Unscheduled, reason for visit: _________________________________________  Section B:  Physical Assessment   Date:    _12_/_Apr_/_2023_   (DD / MMM / YYYY)  Weight: __142 _  []  kg    [x]  pounds Height (Screening Visit Only): ________  []  cm []  inches  Blood Pressure: _116__  / _56_mmHg Heart Rate: _71_ bpm  Section E:  Signature    Person completing form (Print Name): Mercer Pod :) ___________  Signature: Mercer Pod :) __ Date: _04/12/2023_________________________      SECTION A:  Administrative Section  Patient ID: _[id number] - _[id number]_ - 015 Patient Initials: _M_ _J_ _E_  Visit Interval:    []   Screening/Baseline*    []  Activation   []  0.5 Month       []  1 Month     []  2 Month     [x]  3 Month       []  6 Month     []  12 Month         []  Unscheduled, reason for visit: _________________________________________  * For Screening / Baseline only the questions are based on 30 days prior to consent.  SECTION B:  COVID-19 Like Illness Symptoms   Has the subject experienced any cold, flu or COVID-19 symptoms since the last study visit? [x]   No  (skip to section C)     []  Yes, date of onset:  _____/_____ MMM/YYYY    If yes, check all symptoms that apply:   []  Fevers or chills   []   New or Worsening Cough  []  Productive  []  Dry     If yes to cough, indicate severity  []  Constant  []  Occasional, several per hour   []  New or worsened shortness of breath   []  Diarrhea   []  Altered or reduced sense of smell or taste   []  Muscle aches/Severe Fatigue   []  Chest pain or tightness   []  Sore throat   []  Nausea or vomiting  SECTION C:  COVID-19 Like Illness Testing   Has the patient been tested for COVID-19 since the last study visit? [x]  No     []  Yes, date of test:  _____/_____ MMM/YYYY    If yes, test results:   []  Positive []  Negative []  Unknown  Has the  patient been tested for COVID-19 Antibodies since the last study  visit? [x]  No     []  Yes, date of test:  _____/_____ MMM/YYYY    If yes, test results:   []  Positive []  Negative []  Unknown  Has the patient been vaccinated for COVID-19 since the last study visit? [x]  No     []  Yes, date of test:  _____/_____ MMM/YYYY  Has the patient been tested for Influenza ("flu") since the last study visit? [x]  No     []  Yes, date of test:  _____/_____ MMM/YYYY    If yes, test results:   []  Positive []  Negative []  Unknown  Has the patient been vaccinated for Influenza ("flu") since the last study visit? [x]  No     []  Yes, date of test:  _____/_____ MMM/YYYY  SECTION D:  COVID-19 Like Illness Exposure  Has the subject been told that they might have had COVID-19/have symptoms suggestive of COVID-19 since the last study visit? [x]    No   []  Yes   []   Unknown  Has the subject been exposed to anyone with known or suspected COVID-19 since the last study visit? [x]    No   []  Yes   []   Unknown  Has the subject been told that they might have had the "flu" or influenza since the last study visit? [x]    No   []  Yes   []   Unknown  SECTION E:  Effects of COVID Pandemic on Subject's Healthcare Interactions  Since the last study visit, did the subject feel the need to go to an emergency department or hospital for their heart failure but decided not to because of concerns about COVID-19? [x]   No   []  Yes   []  Unknown    If yes, how did they seek care (check all that apply):   []  Telemedicine visit []  In-person []  Clinic []  Urgent Care   []  Subject did not change hospital/ER use due to COVID-19 []  Other, specify: ________________________  Since the last study visit, did the subject have a cardiology/HF related appointment cancelled/rescheduled due to COVID-19 pandemic? [x]   No   []  Yes, how many? ___________   []  Unknown  Since the last study visit, did the subject have any cardiology/HF related telemedicine visit due to COVID-19 pandemic? [x]   No   []  Yes, how many?  ___________   []  Unknown  Since the last study visit, did the subject have a cardiology/HF procedure cancelled/rescheduled due to COVID-19 pandemic? [x]   No   []  Yes, how many? ___________   []  Unknown  SECTION F:  Effects of COVID Pandemic on Subject's Medications  Since the last visit, did any of their heart failure medications change or stop, even for a short time?   If yes, enter change into medication eCRF [x]   No   []  Yes, how many? ___________   []  Unknown    If yes, why:   []  Instructed by Doctor []  Self-discontinued []  Unknown []  Other: ________________  Since the last visit, was the subject prescribed any medications for COVID-19? [x]   No   []  Yes   []  Unknown    If yes, what medications (generic name):  SECTION G:  Effects of COVID Pandemic on Subject's Lifestyle  How has the subject's activity/exercise level changed due to COVID-19 pandemic? []   No change   [x]  More activity/exercise   []  Less activity/exercise  How has the subject's smoking habits changed due to COVID-19? [x]   Does not  smoke   []  No change   []  Smoke more   []  Smoke less  How has the subject's alcohol drinking habits changed due to COVID-19? [x]   Does not drink   []  No change   []  Drink more   []  Drink less  Section H:  Signature  Person completing form (Print Name): Mercer Pod :) __________________  Signature: Mercer Pod :) ______ Date: _04/12/2023___________________       Section A:  Administrative Section   Subject ID:  _[id number]_ - _[id number]_ - 015  Subject Initials:  _M_ _J_ _E_  Visit Interval:   (*if required)   [x]  3 Month       []  6 Month       []  12 Month       []  Unscheduled*   Section B:  CTA   If the subject had a CTA at baseline, a CTA at 41-month should be performed   Was a CTA performed?  Yes []  Not Done   [x]   CTA Date: _____/_______/_______                           (DD / MMM / Annamarie Major)  Images Sent to [facility]    []   Stenosis  Internal Carotid (ICA)  Distal Common  Carotid [facility])   Right Side    _______%    _______%   Left Side    _______%    _______%   Name of person reading CTA:   Section C:  CDU   Was a CDU performed?  Yes    [x]     Not Done []   CDU Date: _12_/_Apr_/_2023_                          (DD / MMM / Annamarie Major)  Images Sent to [facility]    [x]   Stenosis  Internal Carotid (ICA)  Distal Common Carotid [facility])   Right Side    __</= 15_____%    __</= 15______%   Left Side    __</= 15______%    __</= 15______%   Name of person reading CDU: Coral Else MD  Section D:  New Stenosis   Is there new stenosis noted on CDU or CTA?  [x]  No  []  Yes (Specify side):       []  Left   []  Right  []  Both     If yes is there a greater than 50% stenosis in either artery?  []  No []  Yes (Specify side**):        []  Left   []  Right  []  Both   ** CTA should be completed if ?50% stenosis on the implanted side only at 25-month visit      Section E:  58-month Follow-up Only    Has the subject had new stenosis >40% since baseline in the artery where the lead was implanted?  []  No      []  Yes#, please indicate % new stenosis: _______%  (comment below)   Comments on determination of >40% stenosis:   # Subject must be followed after the 79-month visit, please see section 3.4.27 Final Study Visit at 12 Months   Section :  Comments    N/A        Section G:  Signature      Person completing form (Print Name): __Kimberly Ivory Broad :) _____________     Signature: Mercer Pod :) ____  Date: __04/12/2023_____________     Current Outpatient Medications:    aspirin 81 MG chewable tablet, Chew 81 mg by mouth daily., Disp: , Rfl:    buPROPion (WELLBUTRIN XL) 300 MG 24 hr tablet, Take 300 mg by mouth daily., Disp: , Rfl:    carvedilol (COREG) 12.5 MG tablet, TAKE 1 TABLET TWICE DAILY, Disp: 180 tablet, Rfl: 3   Cholecalciferol (VITAMIN D-3 PO), Take 1 tablet by mouth daily., Disp: , Rfl:    dapagliflozin propanediol (FARXIGA) 10 MG TABS tablet, Take 1 tablet (10  mg total) by mouth daily before breakfast., Disp: 90 tablet, Rfl: 3   Multiple Vitamins-Minerals (CENTRUM WOMEN PO), Take 1 tablet by mouth daily., Disp: , Rfl:    REPATHA SURECLICK 140 MG/ML SOAJ, INJECT 1 PEN INTO THE SKIN EVERY 14 DAYS, Disp: 2 mL, Rfl: 11   sacubitril-valsartan (ENTRESTO) 24-26 MG, Take 1 tablet by mouth 2 (two) times daily., Disp: 180 tablet, Rfl: 3   sertraline (ZOLOFT) 100 MG tablet, TAKE 1 TABLET(100 MG) BY MOUTH DAILY IN THE MORNING, Disp: , Rfl:    spironolactone (ALDACTONE) 25 MG tablet, TAKE 1 TABLET EVERY DAY, Disp: 90 tablet, Rfl: 1

## 2021-09-16 NOTE — Progress Notes (Signed)
024 [facility] patient aware of results.

## 2021-10-05 ENCOUNTER — Encounter (HOSPITAL_COMMUNITY): Payer: Medicare PPO | Admitting: Internal Medicine

## 2021-10-20 ENCOUNTER — Other Ambulatory Visit (HOSPITAL_COMMUNITY): Payer: Self-pay

## 2021-10-20 MED ORDER — DAPAGLIFLOZIN PROPANEDIOL 10 MG PO TABS: 10.0000 mg | ORAL_TABLET | Freq: Every day | ORAL | 1 refills | Status: DC

## 2021-10-25 ENCOUNTER — Ambulatory Visit (HOSPITAL_COMMUNITY)
Admission: RE | Admit: 2021-10-25 | Discharge: 2021-10-25 | Disposition: A | Discharge status: Home / Self Care | Payer: Medicare PPO | Source: Ambulatory Visit | Attending: Family Medicine | Admitting: Family Medicine

## 2021-10-25 ENCOUNTER — Encounter: Payer: Medicare PPO | Admitting: *Deleted

## 2021-10-25 ENCOUNTER — Encounter (HOSPITAL_COMMUNITY): Payer: Self-pay

## 2021-10-25 VITALS — BP 100/56 | HR 64 | Wt 143.8 lb

## 2021-10-25 VITALS — BP 101/61 | HR 67 | Wt 146.8 lb

## 2021-10-25 DIAGNOSIS — Z79899 Other long term (current) drug therapy: Secondary | ICD-10-CM | POA: Insufficient documentation

## 2021-10-25 DIAGNOSIS — Z7982 Long term (current) use of aspirin: Secondary | ICD-10-CM | POA: Insufficient documentation

## 2021-10-25 DIAGNOSIS — R42 Dizziness and giddiness: Secondary | ICD-10-CM | POA: Insufficient documentation

## 2021-10-25 DIAGNOSIS — I779 Disorder of arteries and arterioles, unspecified: Secondary | ICD-10-CM | POA: Diagnosis not present

## 2021-10-25 DIAGNOSIS — Z7984 Long term (current) use of oral hypoglycemic drugs: Secondary | ICD-10-CM | POA: Diagnosis not present

## 2021-10-25 DIAGNOSIS — I5042 Chronic combined systolic (congestive) and diastolic (congestive) heart failure: Secondary | ICD-10-CM

## 2021-10-25 DIAGNOSIS — I5022 Chronic systolic (congestive) heart failure: Secondary | ICD-10-CM | POA: Insufficient documentation

## 2021-10-25 DIAGNOSIS — Z7901 Long term (current) use of anticoagulants: Secondary | ICD-10-CM | POA: Insufficient documentation

## 2021-10-25 DIAGNOSIS — Z720 Tobacco use: Secondary | ICD-10-CM

## 2021-10-25 DIAGNOSIS — I110 Hypertensive heart disease with heart failure: Secondary | ICD-10-CM | POA: Insufficient documentation

## 2021-10-25 DIAGNOSIS — J90 Pleural effusion, not elsewhere classified: Secondary | ICD-10-CM | POA: Insufficient documentation

## 2021-10-25 DIAGNOSIS — L905 Scar conditions and fibrosis of skin: Secondary | ICD-10-CM | POA: Insufficient documentation

## 2021-10-25 DIAGNOSIS — J449 Chronic obstructive pulmonary disease, unspecified: Secondary | ICD-10-CM | POA: Diagnosis not present

## 2021-10-25 DIAGNOSIS — I739 Peripheral vascular disease, unspecified: Secondary | ICD-10-CM | POA: Diagnosis not present

## 2021-10-25 DIAGNOSIS — R0602 Shortness of breath: Secondary | ICD-10-CM | POA: Diagnosis present

## 2021-10-25 DIAGNOSIS — Z006 Encounter for examination for normal comparison and control in clinical research program: Secondary | ICD-10-CM

## 2021-10-25 DIAGNOSIS — I2510 Atherosclerotic heart disease of native coronary artery without angina pectoris: Secondary | ICD-10-CM | POA: Diagnosis not present

## 2021-10-25 DIAGNOSIS — Z87891 Personal history of nicotine dependence: Secondary | ICD-10-CM | POA: Insufficient documentation

## 2021-10-25 DIAGNOSIS — Z951 Presence of aortocoronary bypass graft: Secondary | ICD-10-CM | POA: Diagnosis not present

## 2021-10-25 LAB — BASIC METABOLIC PANEL
Anion gap: 8 (ref 5–15)
BUN: 13 mg/dL (ref 8–23)
CO2: 24 mmol/L (ref 22–32)
Calcium: 9 mg/dL (ref 8.9–10.3)
Chloride: 105 mmol/L (ref 98–111)
Creatinine, Ser: 0.86 mg/dL (ref 0.44–1.00)
GFR, Estimated: >60 mL/min (ref >60)
Glucose, Bld: 96 mg/dL (ref 70–99)
Potassium: 4.2 mmol/L (ref 3.5–5.1)
Sodium: 137 mmol/L (ref 135–145)

## 2021-10-25 MED ORDER — CARVEDILOL 6.25 MG PO TABS: 9.3750 mg | ORAL_TABLET | Freq: Two times a day (BID) | ORAL | 3 refills | Status: DC

## 2021-10-25 NOTE — Progress Notes (Signed)
[facility] Note   ID:  Muranda, Klaes 01-15-1946, MRN [id number]  Location: Home  Provider location: [address], [place] Type of Visit: Established patient  PCP:  Pcp, No  Primary HF: Dr Haroldine Laws  Chief Complaint: chronic systolic heart failure, CAD   HPI:  Latoya Adams is a 76 y.o.female with history of CAD s/ CABG x 4 [id number], chronic systolic CHF (EF 0000000 [id number]) cMRI EF 24% with only small apex scar, s/p MDT ICD, tobacco use, COPD, hx of R pleural effusion with previous pleurex catheter, and chronic anemia.   Follow up 11/22, remained very fatigued. Entresto decreased to 24/26 bid. Echo 12/22 showed EF 30-35%, LV moderately decreased with global HK, grade I DD, RV low normal.  S/p Barostim 1/23.   Today she returns for HF follow up. Overall feeling fine. Main complaint is weight gain over the past 5 years, despite eating healthy. She is SOB with walking up inclines but otherwise no issues. Occasional lightheadedness when standing. "I feel clumsy." Denies palpitations, CP, edema, or PND/Orthopnea. Appetite ok. No fever or chills. Weight at home 144-146 pounds. Taking all medications, she has been off her rosuvastatin since barostim implant.  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   Alcohol abuse    Anxiety    Arthritis    CHF (congestive heart failure) (HCC)    Coronary artery disease    Depression    Dyspnea    due to Pulmonary Effusion   History of blood transfusion 2018   during CABG   Hypertension    Myocardial infarction ([place]) 12/2016   Past Surgical History:  Procedure Laterality Date   BAROREFLEX SYSTEM INSERTION N/A 06/15/2021   Procedure: BAROREFLEX SYSTEM INSERTION;  Surgeon: Thompson Grayer, MD;  Location: [facility];  Service: Cardiovascular;  Laterality: N/A;   CHEST TUBE INSERTION Right 03/06/2017   Procedure: INSERTION PLEURAL DRAINAGE  CATHETER;  Surgeon: Ivin Poot, MD;  Location: [facility];  Service: Thoracic;  Laterality: Right;   COLONOSCOPY     CORONARY ARTERY BYPASS GRAFT N/A 12/08/2016   Procedure: CORONARY ARTERY BYPASS GRAFTING (CABG)x4 using left internal mammary artery and bilateral greater saphenous veins harvested endoscopically;  Surgeon: Ivin Poot, MD;  Location: [facility];  Service: Open Heart Surgery;  Laterality: N/A;   dental implants     ICD IMPLANT  09/20/2017   ICD IMPLANT N/A 09/20/2017   Procedure: ICD IMPLANT;  Surgeon: Constance Haw, MD;  Location: [facility];  Service: Cardiovascular;  Laterality: N/A;   IR THORACENTESIS ASP PLEURAL SPACE W/IMG GUIDE  01/24/2017   LEFT HEART CATH AND CORONARY ANGIOGRAPHY N/A 12/03/2016   Procedure: Left Heart Cath and Coronary Angiography;  Surgeon: Sherren Mocha, MD;  Location: [facility];  Service: Cardiovascular;  Laterality: N/A;   REMOVAL OF PLEURAL DRAINAGE CATHETER Right 06/12/2017   Procedure: REMOVAL OF PLEURAL DRAINAGE CATHETER;  Surgeon: Ivin Poot, MD;  Location: [facility];  Service: Thoracic;  Laterality: Right;   RIGHT HEART CATH N/A 12/05/2016   Procedure: Right Heart Cath;  Surgeon: Jolaine Artist, MD;  Location: [facility];  Service: Cardiovascular;  Laterality: N/A;   TEE WITHOUT CARDIOVERSION N/A 12/08/2016   Procedure: TRANSESOPHAGEAL ECHOCARDIOGRAM (TEE);  Surgeon: Prescott Gum, Collier Salina, MD;  Location: [facility];  Service: Open Heart Surgery;  Laterality: N/A;   TOTAL HIP ARTHROPLASTY Left 03/04/2019  Procedure: LEFT TOTAL HIP ARTHROPLASTY ANTERIOR APPROACH;  Surgeon: Mcarthur Rossetti, MD;  Location: [facility];  Service: Orthopedics;  Laterality: Left;   TUBAL LIGATION     Current Outpatient Medications  Medication Sig Dispense Refill   aspirin 81 MG chewable tablet Chew 81 mg by mouth daily.     buPROPion (WELLBUTRIN XL) 300 MG 24 hr tablet Take 300 mg by mouth daily.     carvedilol (COREG) 12.5 MG tablet TAKE 1  TABLET TWICE DAILY 180 tablet 3   Cholecalciferol (VITAMIN D-3 PO) Take 1 tablet by mouth daily.     dapagliflozin propanediol (FARXIGA) 10 MG TABS tablet Take 1 tablet (10 mg total) by mouth daily before breakfast. 90 tablet 1   Multiple Vitamins-Minerals (CENTRUM WOMEN PO) Take 1 tablet by mouth daily.     REPATHA SURECLICK [id number] MG/ML SOAJ INJECT 1 PEN INTO THE SKIN EVERY 14 DAYS 2 mL 11   sacubitril-valsartan (ENTRESTO) 24-26 MG Take 1 tablet by mouth 2 (two) times daily. 180 tablet 3   sertraline (ZOLOFT) 100 MG tablet TAKE 1 TABLET(100 MG) BY MOUTH DAILY IN THE MORNING     spironolactone (ALDACTONE) 25 MG tablet TAKE 1 TABLET EVERY DAY 90 tablet 1   No current facility-administered medications for this encounter.    Allergies:   Codeine, Atorvastatin, Rosuvastatin, and Varenicline tartrate   Social History:  The patient  reports that she quit smoking about 4 years ago. Her smoking use included cigarettes. She has never used smokeless tobacco. She reports that she does not drink alcohol and does not use drugs.   Family History:  [name] family history includes CAD in her sister; Heart block in her father, mother, and sister.   ROS:  Please see the history of present illness.   All other systems are personally reviewed and negative.   BP (!) 100/56   Pulse 64   Wt 65.2 kg (143 lb 12.8 oz)   SpO2 94%   BMI 22.52 kg/m   Wt Readings from Last 3 Encounters:  10/25/21 65.2 kg (143 lb 12.8 oz)  09/14/21 64.4 kg (142 lb)  08/19/21 64.4 kg (142 lb)   Recent Labs: 04/15/2021: ALT 27; B Natriuretic Peptide 254.5 05/19/2021: NT-Pro BNP 621 06/16/2021: BUN 14; Creatinine, Ser 0.98; Hemoglobin 11.2; Platelets 257; Potassium 3.9; Sodium 138  Personally reviewed   Physical Exam:   General:  NAD. No resp difficulty HEENT: Normal Neck: Supple. No JVD. Carotids 2+ bilat; no bruits. No lymphadenopathy or thryomegaly appreciated. Cor: PMI nondisplaced. Regular rate & rhythm. No rubs,  gallops or murmurs. Lungs: Clear Abdomen: Soft, nontender, nondistended. No hepatosplenomegaly. No bruits or masses. Good bowel sounds. Extremities: No cyanosis, clubbing, rash, edema Neuro: Alert & oriented x 3, cranial nerves grossly intact. Moves all 4 extremities w/o difficulty. Affect pleasant.  ECG (personally reviewed): NSR 61 bpm   Device Interrogation (personally reviewed): OptiVol stable, 1-2 hours day/activity, no VT  Assessment & Plan: 1. Chronic systolic HF due to ICM - Pre-op echo (7/18) EF 20-25% with normal RV - Pre-op cMRI (7/18) EF 24% with only small scar in apex. RHC with normal CO and no PAH - s/p CABG x 4. 12/08/16 - Echo 04/2017 EF 20-25%. Echo 09/2017 EF 20%, RV normal. S/p MDT ICD 09/2017. - Echo 09/2017 EF 25%, grade 1 DD, mild AI, mild MR - Echo 12/26/18 EF 30-35% Mild AI. RV ok  - Echo 02/25/20 EF 30-35% RV ok  - Echo (12/22): EF 30-35%, RV low  normal - s/p Barostim 1/23 - Improved NYHA II. Volume status looks good on exam and OptiVol. - With lightheadedness, decrease carvedilol to 9.375 mg bid. - Continue Farxiga 10 mg daily. - Continue Entresto 24/26 mg bid. - Continue spiro 25 mg daily.   - ICD interrogated in person. Report as above - Labs today  2. CAD - S/P CABG x4 on 12/08/16 - No s/s ischemia.  - Continue ASA 81 mg daily.  - She has stopped her atorvastatin, she discussed this with Vascular. - Continue Repatha. Followed by [facility]   3. Recurrent R pleural effusion - S/p Pleurex removal 06/2017. - No recurrence.   4. PAD/claudication  - No claudication. - ABIs 09/2017: bilateral TBI abnormal.  - Aortoiliac 10/2017: significant left common and external iliac artery disease. [facility] consult recommended. She saw Dr Fletcher Anon 11/06/17 and wanted to continue medical therapy for now.   5. Tobacco use/COPD - PFTs show severe COPD.  - No longer smoking   Follow up in 6 months with Dr. Haroldine Laws.  Frankey Poot, FNP  10/25/2021 1:54  PM  [facility] [address] [facility] [place] [address] [phone] (office) [phone] (fax)

## 2021-10-25 NOTE — Patient Instructions (Addendum)
Medication Changes: Take 1 and 1/2 tablet of Carvedilol twice a day.    Lab Work:  Labs today We will only contact you if something comes back abnormal or we need to make some changes. Otherwise no news is good news!    Follow-Up in: 6 months with Dr. [name]  At the [facility], you and your health needs are our priority. We have a designated team specialized in the treatment of Heart Failure. This Care Team includes your primary Heart Failure Specialized Cardiologist (physician), Advanced Practice Providers (APPs- Physician Assistants and Nurse Practitioners), and Pharmacist who all work together to provide you with the care you need, when you need it.   You may see any of the following providers on your designated Care Team at your next follow up:  Dr Arvilla Meres Dr Carron Curie, NP Robbie Lis, [place] [facility] [place], [place] Karle Plumber, PharmD   Please be sure to bring in all your medications bottles to every appointment.   Need to Contact us:  If you have any questions or concerns before your next appointment please send [place] a message through [place] or call our office at [phone].    TO LEAVE A MESSAGE FOR THE NURSE SELECT OPTION 2, PLEASE LEAVE A MESSAGE INCLUDING: YOUR NAME DATE OF BIRTH CALL BACK NUMBER REASON FOR CALL**this is important as we prioritize the call backs  YOU WILL RECEIVE A CALL BACK THE SAME DAY AS LONG AS YOU CALL BEFORE 4:00 PM

## 2021-10-26 ENCOUNTER — Ambulatory Visit (INDEPENDENT_AMBULATORY_CARE_PROVIDER_SITE_OTHER): Payer: Medicare PPO

## 2021-10-26 DIAGNOSIS — I255 Ischemic cardiomyopathy: Secondary | ICD-10-CM | POA: Diagnosis not present

## 2021-10-26 LAB — CUP PACEART REMOTE DEVICE CHECK
Battery Remaining Longevity: 87 mo
Battery Voltage: 3 V
Brady Statistic RV Percent Paced: 0.01 %
Date Time Interrogation Session: 20230523140224
HighPow Impedance: 73 Ohm
Implantable Lead Implant Date: 20190418
Implantable Lead Location: 753860
Implantable Pulse Generator Implant Date: 20190418
Lead Channel Impedance Value: 361 Ohm
Lead Channel Impedance Value: 456 Ohm
Lead Channel Pacing Threshold Amplitude: 0.75 V
Lead Channel Pacing Threshold Pulse Width: 0.4 ms
Lead Channel Sensing Intrinsic Amplitude: 18.875 mV
Lead Channel Sensing Intrinsic Amplitude: 18.875 mV
Lead Channel Setting Pacing Amplitude: 2.5 V
Lead Channel Setting Pacing Pulse Width: 0.4 ms
Lead Channel Setting Sensing Sensitivity: 0.3 mV

## 2021-10-26 NOTE — Research (Signed)
Batwire unscheduled visit   Brought patient in to try and titrate device up a bit.  Patient is feeling ok, still a little lightheaded at times. BP is the same.  She did go to the race on Sunday and was able to walk to the stands. She got SOB but patient feels like it because she was trying to keep up with her family instead of walking her pace.  She was in [place] for a while staying at her daughters house and she was able to walk up the stairs in the house with no problem.    Section A:  Administrative Section  Subject ID: _[id number]_ - _[id number]____ - 015 Subject Initials: _M_ _J_ _E_  Visit Interval:    []   Screening/Baseline    []  Activation   []  0.5 Month       []  1 Month     []  2 Month     []  3 Month       []  6 Month     []  12 Month         [x]  Unscheduled, reason for visit: ___try to titrate device ________  Section B:  Physical Assessment   Date:    __23_/_May__/__2023__   (DD / MMM / )  Weight: _146.8__  []  kg    [x]  pounds Height (Screening Visit Only): ________  []  cm []  inches  Blood Pressure: _101_  / _61_mmHg Heart Rate: __67__ bpm  Section E:  Signature    Person completing form (Print Name): :) _______________  Signature: :) ________ Date: __05/23/2023________________     Section A:  Administrative Section  Subject ID: _[id number]__ - _[id number]_ - 015 Subject Initials: _M_ _J_ _E_  Visit Interval:    []   Screening/Baseline    []  Activation   []  0.5 Month       []  1 Month     []  2 Month     []  3 Month       []  6 Month     []  12 Month         []  Unscheduled, reason for visit: __titrate device____________________  Section B:  Device Information   Battery Battery Voltage: 2.86 V   Battery Life: 80 Months   RRT Date: 22-Jun-2028 (DD/MMM/YYYY)  Lead Impeadance Right Lead __975____ Ohms    []  Low    []  High   Left Lead _________ Ohms    []  Low    [x]  High  Programmed Settings Pathway Pulse Width Amplitude Frequency   []  Left 45 7.2 40    [x]  Right     Section C:  Programming Information (12 Month and Unscheduled - not required)  Date: _23_/__may__/_2023_     (DD / MMM / ) Device information, therapy schedule and programmed settings can be found on the session summary report  Site Clinician Present: __Kimberly Yannet Rincon :) _____________________________________  helping with programming: __Kyle Wolf___________________________________ []  N/A  Location of CV[facility] person: [x]  []  Onsite Remote  Subject Experience   1. Did the subject experience transient bradycardia or hypotension during device testing? []  [x]  Yes No   2. Did the subject experience transient electrical stimulation of non-vascular tissues? [x]  []  Yes No   3. If yes to either question above, was intervention beyond reprogramming needed or was it associated with an additional untoward event? []  [x]  Yes No   4a. Is there any indication that there has been unacceptable device interaction between the CVRx device and  other implanted electrical stimulators or sensors device? []  [x]  []  Yes No N/A (no other device)   4b. 4b. If "Yes" to question 4a, please describe the device interaction and corrective action taken:                  Section D:  Arrhythmia Interventions  Has the subject received a cardiac ablation since the last study visit? []  Yes  Date of last procedure:   ____/____/_____ (DD/MMM/YYYY)   [x]  No   []  Unknown   If yes, what type and # of ablations Type []  Atrial     []  Ventricular    # of procedures: ____________   Was this pre-planned prior to CV[facility] implant? []  Yes    []  No (complete/update AE form)  Has the subject received a cardioversion since the last study visit? []    Yes  Date of last procedure:   ____/____/_____ (DD/MMM/YYYY)   [x]  No   []  Unknown   If yes, what type and # of cardioversions? Type []  Medications     []  Electrical Shock    # of procedures: _____________   Was this pre-planned prior to CVRx  implant? []  Yes    []  No (complete/update AE form)  Section E:  Adverse Events (N/A for Implant Interval)  Have any new adverse events or updates to existing events occurred since last visit? []  Yes (complete/update AE form)   [x]  No  Section F:  Medication Changes   Have there been any changes to the subject's home use medications for Arrhythmia, Antiplatelet/Anticoagulation and Heart failure medications since the last visit? []  Yes (update Med form)   [x]  No  Section G:  Were Fluoroscopy or X-ray images done? []  Yes []  Images sent to [facility] (see Fluoroscopy/X-ray Worksheet)   [x]  No  Section H:  Comments     N/a      Section I:  Signature   Person completing form (Print Name): __Kimberly :) _________________________  Signature: :) _____________ Date: __05/23/2023________________________

## 2021-10-28 ENCOUNTER — Telehealth: Payer: Self-pay | Admitting: *Deleted

## 2021-11-01 ENCOUNTER — Other Ambulatory Visit (HOSPITAL_COMMUNITY): Payer: Self-pay | Admitting: *Deleted

## 2021-11-01 MED ORDER — DAPAGLIFLOZIN PROPANEDIOL 10 MG PO TABS: 10.0000 mg | ORAL_TABLET | Freq: Every day | ORAL | 3 refills | Status: DC

## 2021-11-01 MED ORDER — ENTRESTO 24-26 MG PO TABS: 1.0000 | ORAL_TABLET | Freq: Two times a day (BID) | ORAL | 3 refills | Status: DC

## 2021-11-08 NOTE — Progress Notes (Signed)
Remote ICD transmission.   

## 2021-11-30 ENCOUNTER — Other Ambulatory Visit: Payer: Self-pay | Admitting: *Deleted

## 2021-11-30 DIAGNOSIS — Z006 Encounter for examination for normal comparison and control in clinical research program: Secondary | ICD-10-CM

## 2021-11-30 NOTE — Progress Notes (Signed)
Orders for batwire 36m visit

## 2021-12-22 ENCOUNTER — Ambulatory Visit (HOSPITAL_COMMUNITY)
Admission: RE | Admit: 2021-12-22 | Discharge: 2021-12-22 | Disposition: A | Discharge status: Home / Self Care | Payer: Medicare PPO | Source: Ambulatory Visit | Attending: Internal Medicine | Admitting: Internal Medicine

## 2021-12-22 ENCOUNTER — Ambulatory Visit (HOSPITAL_BASED_OUTPATIENT_CLINIC_OR_DEPARTMENT_OTHER)
Admission: RE | Admit: 2021-12-22 | Discharge: 2021-12-22 | Disposition: A | Discharge status: Home / Self Care | Payer: Medicare PPO | Source: Ambulatory Visit | Attending: Internal Medicine | Admitting: Internal Medicine

## 2021-12-22 ENCOUNTER — Encounter: Payer: Medicare PPO | Admitting: *Deleted

## 2021-12-22 VITALS — BP 125/58 | HR 66 | Wt 146.0 lb

## 2021-12-22 DIAGNOSIS — Z006 Encounter for examination for normal comparison and control in clinical research program: Secondary | ICD-10-CM

## 2021-12-22 NOTE — Progress Notes (Signed)
  Echocardiogram 2D Echocardiogram has been performed.  Latoya Adams 12/22/2021, 12:25 PM

## 2021-12-23 NOTE — Telephone Encounter (Signed)
Visit scheduled °

## 2021-12-29 LAB — ECHOCARDIOGRAM COMPLETE
Area-P 1/2: 2.26 cm2
Calc EF: 34.3 %
MV M vel: 4.72 m/s
MV Peak grad: 89.1 mmHg
P 1/2 time: 640 msec
S' Lateral: 4.5 cm
Single Plane A2C EF: 34.2 %
Single Plane A4C EF: 29.4 %

## 2021-12-30 NOTE — Progress Notes (Signed)
Batwire 024 12m echo Patient aware

## 2022-01-04 ENCOUNTER — Other Ambulatory Visit: Payer: Self-pay | Admitting: Internal Medicine

## 2022-01-09 NOTE — Progress Notes (Signed)
Batwire #24  6 month appt

## 2022-01-11 NOTE — Research (Addendum)
71M visit   Patient noticed some stim only when she turned her head to the right implant side, don't happen all the time. Patient states the stim went away when we turned the device down.  Patient states she has been managing stairs better, she has been able to get on her elliptical, and she has had less shortness of breath   Section A:  Administrative Section  Subject ID: _[id number] - _024_ - 015 Subject Initials: _M_ _J_ _E_  Visit Interval:    []$   Screening/Baseline    []$  Activation   []$  0.5 Month       []$  1 Month     []$  2 Month     []$  3 Month       [x]$  6 Month     []$  12 Month         []$  Unscheduled, reason for visit: _________________________________________  Section B:  Device Information   Battery Battery Voltage: 3.0 V   Battery Life: 84 Months   RRT Date: 19-Dec-2028 (DD/MMM/YYYY)  Lead Impeadance Right Lead ___965__ Ohms    []$  Low    []$  High   Left Lead _________ Ohms    []$  Low    [x]$  High  Programmed Settings Pathway Pulse Width Amplitude Frequency   []$  Left 45 6.8 40   [x]$  Right     Section C:  Programming Information (12 Month and Unscheduled - not required)  Date: _20_/_Jul_/_2023_     (DD / MMM / YYYY) Device information, therapy schedule and programmed settings can be found on the session summary report  Site Clinician Present: ___Kimberly Alba Destine :) ______________________________________________________  [employer] Employee helping with programming: _Stephanie Dollan______ []$  N/A  Location of [facility] person: [x]$  []$  Onsite Remote  Subject Experience   1. Did the subject experience transient bradycardia or hypotension during device testing? []$  [x]$  Yes No   2. Did the subject experience transient electrical stimulation of non-vascular tissues? [x]$  []$  Yes  updated 07/27/2022 [place] :)  No   3. If yes to either question above, was intervention beyond reprogramming needed or was it associated with an additional untoward event? []$  [x]$  Yes No   4a. Is there any indication that  there has been unacceptable device interaction between the [employer] device and other implanted electrical stimulators or sensors device? []$  [x]$  []$  Yes No N/A (no other device)   4b. 4b. If "Yes" to question 4a, please describe the device interaction and corrective action taken:                  Section D:  Arrhythmia Interventions  Has the subject received a cardiac ablation since the last study visit? []$  Yes  Date of last procedure:   ____/____/_____ (DD/MMM/YYYY)   [x]$  No   []$  Unknown   If yes, what type and # of ablations Type []$  Atrial     []$  Ventricular    # of procedures: ____________   Was this pre-planned prior to [facility] implant? []$  Yes    []$  No (complete/update AE form)  Has the subject received a cardioversion since the last study visit? []$    Yes  Date of last procedure:   ____/____/_____ (DD/MMM/YYYY)   [x]$  No   []$  Unknown   If yes, what type and # of cardioversions? Type []$  Medications     []$  Electrical Shock    # of procedures: _____________   Was this pre-planned prior to [employer] implant? []$  Yes    []$   No (complete/update AE form)  Section E:  Adverse Events (N/A for Implant Interval)  Have any new adverse events or updates to existing events occurred since last visit? []$  Yes (complete/update AE form)   [x]$  No  Section F:  Medication Changes   Have there been any changes to the subject's home use medications for Arrhythmia, Antiplatelet/Anticoagulation and Heart failure medications since the last visit? []$  Yes (update Med form)   [x]$  No  Section G:  Were Fluoroscopy or X-ray images done? []$  Yes []$  Images sent to [facility] (see Fluoroscopy/X-ray Worksheet)   [x]$  NoD   Section H:  Comments     N/a      Section I:  Signature   Person completing form (Print Name): Philemon Kingdom :) _____________________  Signature: Philemon Kingdom :) _____ Date: __09/aug/2023________________________     Section A:  Administrative Section  Subject ID: [id number] - _024_ - 015  Subject Initials: _M_ _J_ _E_  Visit Interval:    []$   Screening/Baseline    []$  Activation   []$  0.5 Month       []$  1 Month     []$  2 Month     []$  3 Month       [x]$  6 Month     []$  12 Month         []$  Unscheduled, reason for visit: _________________________________________  Section B:  Physical Assessment   Date:    _20__/_JUL_/_2023__   (DD / MMM / YYYY)  Weight: __146__  []$  kg    [x]$  pounds Height (Screening Visit Only): ________  []$  cm []$  inches  Blood Pressure: __125_  / __58_mmHg Heart Rate: _66__ bpm  Section E:  Signature    Person completing form (Print Name): ___Kimberly Adult nurse :) ___________  Signature: Philemon Kingdom :) ___ Date: __19/aug/2023_____________     Section A:  Administrative Section  Subject ID: [id number] - _024_ - 015 Subject Initials: _M_ _J_ _E_ Visit Interval:  []$   Baseline     [x]$  6 Month  Section B:  NYHA   NYHA Classification Date:    _20_/_JUL_/_2023__  (DD / MMM / YYYY)  Select One Class Subject Symptoms  []$  I No limitations of physical activity, No undue fatigue, palpitation or dyspnea  [x]$  II Slight limitation of physical activity, Comfortable at rest, Less than ordinary activity results in fatigue, Palpitation, or dyspnea  []$  III Marked limitation of physical activity, Comfortable at rest, Less than ordinary activity results in fatigue, palpitation, or dyspnea  []$  IV Unable to carry out any physical activity without discomfort, Symptoms of cardiac insufficiency at rest, Physical activity causes increased discomfort  Name of person conducting NYHA: Philemon Kingdom :) ______________________  Section C:  6MHW   Six Minute Hall Walk Test Date:    _20_/_JUL_/_2023__  (DD / MMM / [place])  Distance Walked __290__ meters  Were there any devices used to assist the subject in walking (e.g. cane, walker, etc.)? [x]$  No   []$  Yes specify:_______________________  Was the walk terminated before 6 minutes? [x]$  No   []$  Yes specify reason: (select all that  apply)    []$  Angina    []$   Dyspnea    []$   Fatigue    []$   Dizziness    []$   Syncope    []$   Other, specify:  Name of person conducting 6-Minute Hall Walk: __Kimberly Alba Destine :) ______________  Section D:  Signature   Person completing form (Print Name): Philemon Kingdom :) ______  Signature:  Philemon Kingdom :) _____ Date: __08/09/2023________________________    SECTION A:  Administrative Section  Patient ID: [id number] - _024_ - 015 Patient Initials: _M_ _J_ _E_  Visit Interval:    []$   Screening/Baseline*    []$  Activation   []$  0.5 Month       []$  1 Month     []$  2 Month     []$  3 Month       [x]$  6 Month     []$  12 Month         []$  Unscheduled, reason for visit: _________________________________________  * For Screening / Baseline only the questions are based on 30 days prior to consent.  SECTION B:  COVID-19 Like Illness Symptoms   Has the subject experienced any cold, flu or COVID-19 symptoms since the last study visit? [x]$   No  (skip to section C)     []$  Yes, date of onset:  _____/_____ MMM/YYYY    If yes, check all symptoms that apply:   []$  Fevers or chills   []$   New or Worsening Cough  []$  Productive  []$  Dry     If yes to cough, indicate severity  []$  Constant  []$  Occasional, several per hour   []$  New or worsened shortness of breath   []$  Diarrhea   []$  Altered or reduced sense of smell or taste   []$  Muscle aches/Severe Fatigue   []$  Chest pain or tightness   []$  Sore throat   []$  Nausea or vomiting  SECTION C:  COVID-19 Like Illness Testing   Has the patient been tested for COVID-19 since the last study visit? [x]$  No     []$  Yes, date of test:  _____/_____ MMM/YYYY    If yes, test results:   []$  Positive []$  Negative []$  Unknown  Has the patient been tested for COVID-19 Antibodies since the last study visit? [x]$  No     []$  Yes, date of test:  _____/_____ MMM/YYYY    If yes, test results:   []$  Positive []$  Negative []$  Unknown  Has the patient been vaccinated for COVID-19 since  the last study visit? [x]$  No     []$  Yes, date of test:  _____/_____ MMM/YYYY  Has the patient been tested for Influenza ("flu") since the last study visit? [x]$  No     []$  Yes, date of test:  _____/_____ MMM/YYYY    If yes, test results:   []$  Positive []$  Negative []$  Unknown  Has the patient been vaccinated for Influenza ("flu") since the last study visit? [x]$  No     []$  Yes, date of test:  _____/_____ MMM/YYYY  SECTION D:  COVID-19 Like Illness Exposure  Has the subject been told that they might have had COVID-19/have symptoms suggestive of COVID-19 since the last study visit? [x]$    No   []$  Yes   []$   Unknown  Has the subject been exposed to anyone with known or suspected COVID-19 since the last study visit? [x]$    No   []$  Yes   []$   Unknown  Has the subject been told that they might have had the "flu" or influenza since the last study visit? [x]$    No   []$  Yes   []$   Unknown  SECTION E:  Effects of COVID Pandemic on [place] Interactions  Since the last study visit, did the subject feel the need to go to an emergency department or hospital for their heart failure but decided not to because of concerns  about COVID-19? [x]$   No   []$  Yes   []$  Unknown    If yes, how did they seek care (check all that apply):   []$  Telemedicine visit []$  In-person []$  Clinic []$  Urgent Care   []$  Subject did not change hospital/ER use due to COVID-19 []$  Other, specify: ________________________  Since the last study visit, did the subject have a cardiology/HF related appointment cancelled/rescheduled due to COVID-19 pandemic? [x]$   No   []$  Yes, how many? ___________   []$  Unknown  Since the last study visit, did the subject have any cardiology/HF related telemedicine visit due to COVID-19 pandemic? [x]$   No   []$  Yes, how many? ___________   []$  Unknown  Since the last study visit, did the subject have a cardiology/HF procedure cancelled/rescheduled due to COVID-19 pandemic? [x]$   No   []$  Yes, how many?  ___________   []$  Unknown  SECTION F:  Effects of COVID Pandemic on Subject's Medications  Since the last visit, did any of their heart failure medications change or stop, even for a short time?   If yes, enter change into medication eCRF [x]$   No   []$  Yes, how many? ___________   []$  Unknown    If yes, why:   []$  Instructed by Doctor []$  Self-discontinued []$  Unknown []$  Other: ________________  Since the last visit, was the subject prescribed any medications for COVID-19? [x]$   No   []$  Yes   []$  Unknown    If yes, what medications (generic name):  SECTION G:  Effects of COVID Pandemic on Subject's Lifestyle  How has the subject's activity/exercise level changed due to COVID-19 pandemic? [x]$   No change   []$  More activity/exercise   []$  Less activity/exercise  How has the subject's smoking habits changed due to COVID-19? [x]$   Does not smoke   []$  No change   []$  Smoke more   []$  Smoke less  How has the subject's alcohol drinking habits changed due to COVID-19? []$   Does not drink   [x]$  No change   []$  Drink more   []$  Drink less  Section H:  Signature  Person completing form (Print Name): Philemon Kingdom :) ____________________  Signature: Philemon Kingdom :) _____ Date: __08/09/2023_______      Section A:  Administrative Section  Subject ID: [id number] - _024_ - 015 Subject Initials: _M_ _J_ _E_ Visit Interval:  []$   Baseline     [x]$  6 Month     []$  12 Month  Section B:  Cardiac Echo   Cardiac Echo Date:  _20_/_JUL__/_2023__ (DD / MMM / [place])  [x]$  Images sent to [facility]  Heart rate ___62__ bpm Echo type: [x]$  2D Images Captured: (4-Chamber and Biplane required) [x]$  PS-LAX  Blood Pressure _109_/_64_ mmHg  []$  3D  [x]$  Biplane        [x]$  Apical 4-Chamber        []$  Other, specify:_______   LV End Diastolic Dimension 99991111 mm   LV End Systolic Dimension A999333 mm   LA Volume ____11___________ mL/m2   LV End-Diastolic Volume [id number] mL   LV End-Systolic  Volume 123456 mL   LV Ejection Fraction ____30___________ %  Section C:  Comments  N/a          Section C:  Signature   Person completing form (Print Name): __Kimberly Alba Destine :) ________________  Signature: Philemon Kingdom :) _________ Date: _08/10/2023___________      Updated Left ICA to 16-49% per report on CDU Section A:  Administrative Section  Subject ID:  [id number] - _024_ - 015  Subject Initials:  _M__ _J_ _E__  Visit Interval:   (*if required)   []$  3 Month       [x]$  6 Month       []$  12 Month       []$  Unscheduled*   Section B:  CTA   If the subject had a CTA at baseline, a CTA at 29-monthshould be performed   Was a CTA performed?  Yes []$  Not Done   [x]$   CTA Date: _____/_______/_______                           (DD / MMM / [place]  Images Sent to [facility]    []$   Stenosis  Internal Carotid (ICA)  Distal Common Carotid ([facility]   Right Side    _______%    _______%   Left Side    _______%    _______%   Name of person reading CTA:   Section C:  CDU   Was a CDU performed?  Yes    [x]$     Not Done []$   CDU Date: __20_/_JUL_/_2023___                           (DD / MMM / [place]  Images Sent to [facility]    [x]$   Stenosis  Internal Carotid (ICA)  Distal Common Carotid ([facility]   Right Side    __</= 15___%    _</= 15______%   Left Side    __16-49%_____%    __</= 15_____%   Name of person reading CDU: TLeitha Bleak MD  Section D:  New Stenosis   Is there new stenosis noted on CDU or CTA?  []$  No  [x]$  Yes (Specify side):       [x]$  Left   []$  Right  []$  Both     If yes is there a greater than 50% stenosis in either artery?  [x]$  No []$  Yes (Specify side**):        []$  Left   []$  Right  []$  Both   ** CTA should be completed if ?50% stenosis on the implanted side only at 135-monthisit      Section E:  1223-monthllow-up Only    Has the subject had new stenosis >40% since baseline in the artery where the lead was implanted?  [x]$  No      []$  Yes#, please indicate % new  stenosis: _______%  (comment below)   Comments on determination of >40% stenosis:   # Subject must be followed after the 12-73-monthit, please see section 3.4.27 Final Study Visit at 12 Months   Section :  Comments    left carotid: velocities in the left ICA are consistent with a 40-59% elevated velocities   suggest low in of range see report      Section G:  Signature      Person completing form (Print Name): _KimPhilemon Kingdom______     Signature: _KimPhilemon Kingdom___    Date: ___08/11/2023_______________________

## 2022-01-25 ENCOUNTER — Ambulatory Visit (INDEPENDENT_AMBULATORY_CARE_PROVIDER_SITE_OTHER): Payer: Medicare PPO

## 2022-01-25 DIAGNOSIS — I255 Ischemic cardiomyopathy: Secondary | ICD-10-CM

## 2022-01-25 DIAGNOSIS — I5022 Chronic systolic (congestive) heart failure: Secondary | ICD-10-CM

## 2022-01-26 LAB — CUP PACEART REMOTE DEVICE CHECK
Battery Remaining Longevity: 84 mo
Battery Voltage: 3 V
Brady Statistic RV Percent Paced: 0 %
Date Time Interrogation Session: 20230823033326
HighPow Impedance: 76 Ohm
Implantable Lead Implant Date: 20190418
Implantable Lead Location: 753860
Implantable Pulse Generator Implant Date: 20190418
Lead Channel Impedance Value: 418 Ohm
Lead Channel Impedance Value: 513 Ohm
Lead Channel Pacing Threshold Amplitude: 0.875 V
Lead Channel Pacing Threshold Pulse Width: 0.4 ms
Lead Channel Sensing Intrinsic Amplitude: 21.25 mV
Lead Channel Sensing Intrinsic Amplitude: 21.25 mV
Lead Channel Setting Pacing Amplitude: 2.5 V
Lead Channel Setting Pacing Pulse Width: 0.4 ms
Lead Channel Setting Sensing Sensitivity: 0.3 mV

## 2022-02-21 NOTE — Progress Notes (Signed)
Remote ICD transmission.   

## 2022-04-26 ENCOUNTER — Ambulatory Visit (INDEPENDENT_AMBULATORY_CARE_PROVIDER_SITE_OTHER): Payer: Medicare PPO

## 2022-04-26 DIAGNOSIS — I255 Ischemic cardiomyopathy: Secondary | ICD-10-CM

## 2022-04-26 LAB — CUP PACEART REMOTE DEVICE CHECK
Battery Remaining Longevity: 79 mo
Battery Voltage: 3 V
Brady Statistic RV Percent Paced: 0.01 %
Date Time Interrogation Session: 20231122043726
HighPow Impedance: 66 Ohm
Implantable Lead Connection Status: 753985
Implantable Lead Implant Date: 20190418
Implantable Lead Location: 753860
Implantable Pulse Generator Implant Date: 20190418
Lead Channel Impedance Value: 361 Ohm
Lead Channel Impedance Value: 456 Ohm
Lead Channel Pacing Threshold Amplitude: 0.875 V
Lead Channel Pacing Threshold Pulse Width: 0.4 ms
Lead Channel Sensing Intrinsic Amplitude: 20.75 mV
Lead Channel Sensing Intrinsic Amplitude: 20.75 mV
Lead Channel Setting Pacing Amplitude: 2.5 V
Lead Channel Setting Pacing Pulse Width: 0.4 ms
Lead Channel Setting Sensing Sensitivity: 0.3 mV
Zone Setting Status: 755011
Zone Setting Status: 755011

## 2022-05-03 ENCOUNTER — Ambulatory Visit (HOSPITAL_COMMUNITY)
Admission: RE | Admit: 2022-05-03 | Discharge: 2022-05-03 | Disposition: A | Discharge status: Home / Self Care | Payer: Medicare PPO | Source: Ambulatory Visit | Attending: Internal Medicine | Admitting: Internal Medicine

## 2022-05-03 ENCOUNTER — Encounter (HOSPITAL_COMMUNITY): Payer: Self-pay | Admitting: Internal Medicine

## 2022-05-03 VITALS — BP 100/60 | HR 66 | Wt 145.8 lb

## 2022-05-03 DIAGNOSIS — J449 Chronic obstructive pulmonary disease, unspecified: Secondary | ICD-10-CM | POA: Insufficient documentation

## 2022-05-03 DIAGNOSIS — Z7982 Long term (current) use of aspirin: Secondary | ICD-10-CM | POA: Insufficient documentation

## 2022-05-03 DIAGNOSIS — J90 Pleural effusion, not elsewhere classified: Secondary | ICD-10-CM | POA: Diagnosis not present

## 2022-05-03 DIAGNOSIS — L905 Scar conditions and fibrosis of skin: Secondary | ICD-10-CM | POA: Diagnosis not present

## 2022-05-03 DIAGNOSIS — Z79899 Other long term (current) drug therapy: Secondary | ICD-10-CM | POA: Insufficient documentation

## 2022-05-03 DIAGNOSIS — I739 Peripheral vascular disease, unspecified: Secondary | ICD-10-CM | POA: Diagnosis not present

## 2022-05-03 DIAGNOSIS — Z87891 Personal history of nicotine dependence: Secondary | ICD-10-CM | POA: Insufficient documentation

## 2022-05-03 DIAGNOSIS — I2510 Atherosclerotic heart disease of native coronary artery without angina pectoris: Secondary | ICD-10-CM | POA: Diagnosis not present

## 2022-05-03 DIAGNOSIS — Z951 Presence of aortocoronary bypass graft: Secondary | ICD-10-CM | POA: Insufficient documentation

## 2022-05-03 DIAGNOSIS — I5022 Chronic systolic (congestive) heart failure: Secondary | ICD-10-CM

## 2022-05-03 DIAGNOSIS — I110 Hypertensive heart disease with heart failure: Secondary | ICD-10-CM | POA: Insufficient documentation

## 2022-05-03 DIAGNOSIS — E782 Mixed hyperlipidemia: Secondary | ICD-10-CM | POA: Diagnosis not present

## 2022-05-03 LAB — COMPREHENSIVE METABOLIC PANEL
ALT: 20 U/L (ref 0–44)
AST: 19 U/L (ref 15–41)
Albumin: 3.7 g/dL (ref 3.5–5.0)
Alkaline Phosphatase: 82 U/L (ref 38–126)
Anion gap: 7 (ref 5–15)
BUN: 16 mg/dL (ref 8–23)
CO2: 23 mmol/L (ref 22–32)
Calcium: 8.6 mg/dL — ABNORMAL LOW (ref 8.9–10.3)
Chloride: 107 mmol/L (ref 98–111)
Creatinine, Ser: 0.94 mg/dL (ref 0.44–1.00)
GFR, Estimated: >60 mL/min (ref >60)
Glucose, Bld: 104 mg/dL — ABNORMAL HIGH (ref 70–99)
Potassium: 3.9 mmol/L (ref 3.5–5.1)
Sodium: 137 mmol/L (ref 135–145)
Total Bilirubin: 0.6 mg/dL (ref 0.3–1.2)
Total Protein: 6.4 g/dL — ABNORMAL LOW (ref 6.5–8.1)

## 2022-05-03 LAB — BRAIN NATRIURETIC PEPTIDE: B Natriuretic Peptide: 260.7 pg/mL — ABNORMAL HIGH (ref 0.0–100.0)

## 2022-05-03 LAB — CBC
HCT: 39.7 % (ref 36.0–46.0)
Hemoglobin: 12.9 g/dL (ref 12.0–15.0)
MCH: 31.2 pg (ref 26.0–34.0)
MCHC: 32.5 g/dL (ref 30.0–36.0)
MCV: 96.1 fL (ref 80.0–100.0)
Platelets: 272 10*3/uL (ref 150–400)
RBC: 4.13 MIL/uL (ref 3.87–5.11)
RDW: 13.2 % (ref 11.5–15.5)
WBC: 4.6 10*3/uL (ref 4.0–10.5)
nRBC: 0 % (ref 0.0–0.2)

## 2022-05-03 LAB — LIPID PANEL
Cholesterol: 149 mg/dL (ref 0–200)
HDL: 40 mg/dL — ABNORMAL LOW (ref >40)
LDL Cholesterol: 82 mg/dL (ref 0–99)
Total CHOL/HDL Ratio: 3.7 RATIO
Triglycerides: 134 mg/dL (ref <150)
VLDL: 27 mg/dL (ref 0–40)

## 2022-05-03 NOTE — Patient Instructions (Signed)
Good to see you today!  No  changes to your medication were made. Continue as previously  directed   Labs done today, your results will be available in MyChart, we will contact you for abnormal readings.  Your physician recommends that you schedule a follow-up appointment in: 6 months(MAY 2024) Call office in February 2024 to schedule an appointment.  If you have any questions or concerns before your next appointment please send [place] a message through [place] or call our office at [phone].    TO LEAVE A MESSAGE FOR THE NURSE SELECT OPTION 2, PLEASE LEAVE A MESSAGE INCLUDING: YOUR NAME DATE OF BIRTH CALL BACK NUMBER REASON FOR CALL**this is important as we prioritize the call backs  YOU WILL RECEIVE A CALL BACK THE SAME DAY AS LONG AS YOU CALL BEFORE 4:00 PM At the [facility], you and your health needs are our priority. As part of our continuing mission to provide you with exceptional heart care, we have created designated Provider Care Teams. These Care Teams include your primary Cardiologist (physician) and Advanced Practice Providers (APPs- Physician Assistants and Nurse Practitioners) who all work together to provide you with the care you need, when you need it.   You may see any of the following providers on your designated Care Team at your next follow up: Dr Arvilla Meres Dr Marca Ancona Dr. Marcos Eke, NP Robbie Lis, [place] [facility], [place] Brynda Peon, NP Karle Plumber, PharmD   Please be sure to bring in all your medications bottles to every appointment.   Do the following things EVERYDAY: Weigh yourself in the morning before breakfast. Write it down and keep it in a log. Take your medicines as prescribed Eat low salt foods--Limit salt (sodium) to 2000 mg per day.  Stay as active as you can everyday Limit all fluids for the day to less than 2 liters

## 2022-05-03 NOTE — Addendum Note (Signed)
Encounter addended by: Suezanne Cheshire, RN on: 05/03/2022 12:08 PM  Actions taken: Clinical Note Signed

## 2022-05-03 NOTE — Progress Notes (Signed)
Advanced Heart Failure Clinic Note   ID:  Latoya, Adams 15-Jan-1946, MRN [id number]  Location: Home  Provider location: [address] [place] Type of Visit: Established patient  PCP:  Pcp, No  Primary HF: Dr [name]  Chief Complaint: chronic systolic heart failure, CAD   HPI:  Latoya Adams is a 76 y.o.female with history of CAD s/ CABG x 4 12/08/2016, chronic systolic CHF (EF 79-02% 09/2017) cMRI EF 24% with only small apex scar, s/p MDT ICD, tobacco use, COPD, hx of R pleural effusion with previous pleurex catheter, and chronic anemia.   Follow up 11/22, remained very fatigued. Entresto decreased to 24/26 bid. Echo 12/22 showed EF 30-35%, LV moderately decreased with global HK, grade I DD, RV low normal.  S/p Barostim 1/23.   Today she returns for HF follow up. Feels pretty good. Does ADLs without too much problem. Fatigued if she tries to do more. Had to turn barostim down several times because she "felt like a [place]". Claudication resolved.    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   Alcohol abuse    Anxiety    Arthritis    CHF (congestive heart failure) (HCC)    Coronary artery disease    Depression    Dyspnea    due to Pulmonary Effusion   History of blood transfusion 2018   during CABG   Hypertension    Myocardial infarction (HCC) 12/2016   Past Surgical History:  Procedure Laterality Date   BAROREFLEX SYSTEM INSERTION N/A 06/15/2021   Procedure: BAROREFLEX SYSTEM INSERTION;  Surgeon: Hillis Range, MD;  Location: [facility] INVASIVE CV LAB;  Service: Cardiovascular;  Laterality: N/A;   CHEST TUBE INSERTION Right 03/06/2017   Procedure: INSERTION PLEURAL DRAINAGE CATHETER;  Surgeon: Kerin Perna, MD;  Location: [facility] OR;  Service: Thoracic;  Laterality: Right;   COLONOSCOPY     CORONARY ARTERY BYPASS GRAFT N/A 12/08/2016   Procedure: CORONARY ARTERY BYPASS GRAFTING (CABG)x4 using  left internal mammary artery and bilateral greater saphenous veins harvested endoscopically;  Surgeon: Kerin Perna, MD;  Location: [facility] OR;  Service: Open Heart Surgery;  Laterality: N/A;   dental implants     ICD IMPLANT  09/20/2017   ICD IMPLANT N/A 09/20/2017   Procedure: ICD IMPLANT;  Surgeon: Regan Lemming, MD;  Location[facility] INVASIVE CV LAB;  Service: Cardiovascular;  Laterality: N/A;   IR THORACENTESIS ASP PLEURAL SPACE W/IMG GUIDE  01/24/2017   LEFT HEART CATH AND CORONARY ANGIOGRAPHY N/A 12/03/2016   Procedure: Left Heart Cath and Coronary Angiography;  Surgeon: Tonny Bollman, MD;  Location: [facility] INVASIVE CV LAB;  Service: Cardiovascular;  Laterality: N/A;   REMOVAL OF PLEURAL DRAINAGE CATHETER Right 06/12/2017   Procedure: REMOVAL OF PLEURAL DRAINAGE CATHETER;  Surgeon: Kerin Perna, MD;  Location: [facility] OR;  Service: Thoracic;  Laterality: Right;   RIGHT HEART CATH N/A 12/05/2016   Procedure: Right Heart Cath;  Surgeon: Dolores Patty, MD;  Location: [facility] INVASIVE CV LAB;  Service: Cardiovascular;  Laterality: N/A;   TEE WITHOUT CARDIOVERSION N/A 12/08/2016   Procedure: TRANSESOPHAGEAL ECHOCARDIOGRAM (TEE);  Surgeon: Donata Clay, Theron Arista, MD;  Location: [facility] OR;  Service: Open Heart Surgery;  Laterality: N/A;   TOTAL HIP ARTHROPLASTY Left 03/04/2019   Procedure: LEFT TOTAL HIP ARTHROPLASTY ANTERIOR APPROACH;  Surgeon: Kathryne Hitch, MD;  Location: [facility] OR;  Service: Orthopedics;  Laterality: Left;   TUBAL LIGATION  Current Outpatient Medications  Medication Sig Dispense Refill   aspirin 81 MG chewable tablet Chew 81 mg by mouth daily.     buPROPion (WELLBUTRIN XL) 300 MG 24 hr tablet Take 300 mg by mouth daily.     carvedilol (COREG) 6.25 MG tablet Take 1.5 tablets (9.375 mg total) by mouth 2 (two) times daily. 90 tablet 3   Cholecalciferol (VITAMIN D-3 PO) Take 1 tablet by mouth daily.     dapagliflozin propanediol (FARXIGA) 10 MG TABS tablet Take 1 tablet (10 mg total) by  mouth daily before breakfast. 90 tablet 3   Multiple Vitamins-Minerals (CENTRUM WOMEN PO) Take 1 tablet by mouth daily.     REPATHA SURECLICK 140 MG/ML SOAJ INJECT 1 PEN INTO THE SKIN EVERY 14 DAYS 2 mL 11   sacubitril-valsartan (ENTRESTO) 24-26 MG Take 1 tablet by mouth 2 (two) times daily. 180 tablet 3   sertraline (ZOLOFT) 100 MG tablet Take 100 mg by mouth 2 (two) times daily.     spironolactone (ALDACTONE) 25 MG tablet TAKE 1 TABLET EVERY DAY 90 tablet 1   No current facility-administered medications for this encounter.    Allergies:   Codeine, Atorvastatin, Rosuvastatin, and Varenicline tartrate   Social History:  The patient  reports that she quit smoking about 5 years ago. Her smoking use included cigarettes. She has never used smokeless tobacco. She reports that she does not drink alcohol and does not use drugs.   Family History:  [name] family history includes CAD in her sister; Heart block in her father, mother, and sister.   ROS:  Please see the history of present illness.   All other systems are personally reviewed and negative.   BP 100/60   Pulse 66   Wt 66.1 kg (145 lb 12.8 oz)   SpO2 97%   BMI 22.84 kg/m   Wt Readings from Last 3 Encounters:  05/03/22 66.1 kg (145 lb 12.8 oz)  01/11/22 66.2 kg (146 lb)  10/25/21 65.2 kg (143 lb 12.8 oz)   Recent Labs: 05/19/2021: NT-Pro BNP 621 06/16/2021: Hemoglobin 11.2; Platelets 257 10/25/2021: BUN 13; Creatinine, Ser 0.86; Potassium 4.2; Sodium 137  Personally reviewed   Physical Exam:   General:  Well appearing. No resp difficulty HEENT: normal Neck: supple. no JVD. Carotids 2+ bilat; no bruits. No lymphadenopathy or thryomegaly appreciated. Cor: PMI nondisplaced. Regular rate & rhythm. No rubs, gallops or murmurs. Lungs: decreased throughout Abdomen: soft, nontender, nondistended. No hepatosplenomegaly. No bruits or masses. Good bowel sounds. Extremities: no cyanosis, clubbing, rash, edema Neuro: alert &  orientedx3, cranial nerves grossly intact. moves all 4 extremities w/o difficulty. Affect pleasant  ECG: NSR 67 Personally reviewed   Device Interrogation (personally reviewed): OptiVol down, 1-2 hours day/activity, no AF or VT Personally reviewed  Assessment & Plan: 1. Chronic systolic HF due to ICM - Pre-op echo (7/18) EF 20-25% with normal RV - Pre-op cMRI (7/18) EF 24% with only small scar in apex. RHC with normal CO and no PAH - s/p CABG x 4. 12/08/16 - Echo 04/2017 EF 20-25%. Echo 09/2017 EF 20%, RV normal. S/p MDT ICD 09/2017. - Echo 09/2017 EF 25%, grade 1 DD, mild AI, mild MR - Echo 12/26/18 EF 30-35% Mild AI. RV ok  - Echo 02/25/20 EF 30-35% RV ok  - Echo (12/22): EF 30-35%, RV low normal - Echo 7/23 EF 30% moderate MR RV mildly down  - s/p Barostim 1/23 - Stable NYHA II-III Volume status looks good on exam and  OptiVol. - Continue carvedilol to 9.375 mg bid. (Cut back due to dizziness) - Continue Farxiga 10 mg daily. - Continue Entresto 24/26 mg bid. - Continue spiro 25 mg daily.   - ICD interrogated personally as above - Labs today  2. CAD - S/P CABG x4 on 12/08/16 - No s/s angina - Continue ASA 81 mg daily.  - She has stopped her atorvastatin, she discussed this with Vascular. - Continue Repatha. Followed by [facility]   3. Recurrent R pleural effusion - S/p Pleurex removal 06/2017. - No recurrence.   4. PAD/claudication  - No claudication. - ABIs 09/2017: bilateral TBI abnormal.  - Aortoiliac 10/2017: significant left common and external iliac artery disease. VVS consult recommended. She saw Dr Kirke Corin 11/06/17 and wanted to continue medical therapy for now.   5. Tobacco use/COPD - PFTs show severe COPD.  - No longer smoking    Signed, Arvilla Meres, MD  05/03/2022 11:51 AM  [facility] [address] [facility] [place] [address] [phone] (office) [phone] (fax)

## 2022-05-03 NOTE — Addendum Note (Signed)
Encounter addended by: Suezanne Cheshire, RN on: 05/03/2022 12:03 PM  Actions taken: Order list changed, Diagnosis association updated

## 2022-05-13 ENCOUNTER — Other Ambulatory Visit (HOSPITAL_COMMUNITY): Payer: Self-pay | Admitting: Family Medicine

## 2022-05-14 ENCOUNTER — Other Ambulatory Visit (HOSPITAL_COMMUNITY): Payer: Self-pay | Admitting: Internal Medicine

## 2022-05-17 NOTE — Progress Notes (Signed)
Remote ICD transmission.   

## 2022-06-01 ENCOUNTER — Other Ambulatory Visit: Payer: Self-pay | Admitting: *Deleted

## 2022-06-01 DIAGNOSIS — Z006 Encounter for examination for normal comparison and control in clinical research program: Secondary | ICD-10-CM

## 2022-06-01 NOTE — Progress Notes (Signed)
Orders placed for research 1 yr follow up for batwire

## 2022-06-14 ENCOUNTER — Other Ambulatory Visit (HOSPITAL_COMMUNITY): Payer: Self-pay

## 2022-06-28 ENCOUNTER — Encounter (HOSPITAL_COMMUNITY): Payer: Medicare PPO

## 2022-06-28 ENCOUNTER — Other Ambulatory Visit (HOSPITAL_COMMUNITY): Payer: Medicare PPO

## 2022-06-29 ENCOUNTER — Encounter: Payer: Medicare PPO | Admitting: *Deleted

## 2022-06-29 ENCOUNTER — Ambulatory Visit (HOSPITAL_COMMUNITY)
Admission: RE | Admit: 2022-06-29 | Discharge: 2022-06-29 | Disposition: A | Discharge status: Home / Self Care | Payer: Medicare PPO | Source: Ambulatory Visit | Attending: Cardiology | Admitting: Cardiology

## 2022-06-29 ENCOUNTER — Telehealth (HOSPITAL_COMMUNITY): Payer: Self-pay

## 2022-06-29 VITALS — BP 104/51 | HR 60 | Resp 16 | Wt 150.2 lb

## 2022-06-29 DIAGNOSIS — Z006 Encounter for examination for normal comparison and control in clinical research program: Secondary | ICD-10-CM

## 2022-06-29 DIAGNOSIS — I351 Nonrheumatic aortic (valve) insufficiency: Secondary | ICD-10-CM

## 2022-06-29 MED ORDER — IOHEXOL 350 MG/ML SOLN
75.0000 mL | Freq: Once | INTRAVENOUS | Status: AC | PRN
  Administered 2022-06-29: 75 mL via INTRAVENOUS

## 2022-06-29 MED ORDER — SODIUM CHLORIDE (PF) 0.9 % IJ SOLN
INTRAMUSCULAR | Status: AC
  Filled 2022-06-29: qty 50

## 2022-06-29 NOTE — Research (Signed)
Batwire 75YR   Section A:  Administrative Section  Subject ID: _[id number]__ - _024_ - 015 Subject Initials: _M_ _J_ _E_  Visit Interval:    []$   Screening/Baseline    []$  Activation   []$  0.5 Month       []$  1 Month     []$  2 Month     []$  3 Month       []$  6 Month     [x]$  12 Month         []$  Unscheduled, reason for visit: _________________________________________  Section B:  Device Information   Battery Battery Voltage: 2.93 V   Battery Life: 74 Months   RRT Date: 31-Aug-2028 (DD/MMM/YYYY)  Lead Impeadance Right Lead 1103 Ohms    []$  Low    []$  High   Left Lead  Ohms    []$  Low    [x]$  High  Programmed Settings Pathway Pulse Width Amplitude Frequency   []$  Left 45 6.8 40   [x]$  Right     Section C:  Programming Information (12 Month and Unscheduled - not required)  Date: _25_/_Jan_/_2024__     (DD / MMM / YYYY) Device information, therapy schedule and programmed settings can be found on the session summary report  Site Clinician Present: __Kimberly [place] :) _______________________________________________________  [employer] Employee helping with programming: _Kyle Wolf______ []$  N/A  Location of CVRx person: []$  [x]$  Onsite Remote  Subject Experience   1. Did the subject experience transient bradycardia or hypotension during device testing? []$  [x]$  Yes No   2. Did the subject experience transient electrical stimulation of non-vascular tissues? []$  [x]$  Yes No   3. If yes to either question above, was intervention beyond reprogramming needed or was it associated with an additional untoward event? []$  []$  Yes No   4a. Is there any indication that there has been unacceptable device interaction between the CVRx device and other implanted electrical stimulators or sensors device? []$  [x]$  []$  Yes No N/A (no other device)   4b. 4b. If "Yes" to question 4a, please describe the device interaction and corrective action taken:                  Section D:  Arrhythmia Interventions  Has the subject  received a cardiac ablation since the last study visit? []$  Yes  Date of last procedure:   ____/____/_____ (DD/MMM/YYYY)   [x]$  No   []$  Unknown   If yes, what type and # of ablations Type []$  Atrial     []$  Ventricular    # of procedures: ____________   Was this pre-planned prior to CVRx implant? []$  Yes    []$  No (complete/update AE form)  Has the subject received a cardioversion since the last study visit? []$    Yes  Date of last procedure:   ____/____/_____ (DD/MMM/YYYY)   [x]$  No   []$  Unknown   If yes, what type and # of cardioversions? Type []$  Medications     []$  Electrical Shock    # of procedures: _____________   Was this pre-planned prior to CVRx implant? []$  Yes    []$  No (complete/update AE form)  Section E:  Adverse Events (N/A for Implant Interval)  Have any new adverse events or updates to existing events occurred since last visit? []$  Yes (complete/update AE form)   [x]$  No  Section F:  Medication Changes   Have there been any changes to the subject's home use medications for Arrhythmia, Antiplatelet/Anticoagulation and Heart failure medications since the  last visit? []$  Yes (update Med form)   [x]$  No  Section G:  Were Fluoroscopy or X-ray images done? []$  Yes []$  Images sent to CVRx (see Fluoroscopy/X-ray Worksheet)   [x]$  NoD   Section H:  Comments     N/a      Section I:  Signature   Person completing form (Print Name): Philemon Kingdom :) _________________________  Signature: Philemon Kingdom :) ______ Date: _01/25/2024____________________     Section A:  Administrative Section  Subject ID: [id number] - _[id number]_ - 015 Subject Initials: _M_ _J_ _E_  Visit Interval:    []$   Screening/Baseline    []$  Activation   []$  0.5 Month       []$  1 Month     []$  2 Month     []$  3 Month       []$  6 Month     [x]$  12 Month         []$  Unscheduled, reason for visit: _________________________________________  Section B:  Physical Assessment   Date:    _25_/_Jan_/_2024______   (DD / MMM /  YYYY)  Weight: _150  []$  kg    [x]$  pounds Height (Screening Visit Only): ________  []$  cm []$  inches  Blood Pressure: _104_  / __51_mmHg Heart Rate: _60_ bpm  Section E:  Signature    Person completing form (Print Name): __Kimberly Adult nurse :) ________________  Signature: Philemon Kingdom :) _ Date: _01/25/2024_________________________     SECTION A:  Administrative Section  Patient ID: [id number] - _024_ - 015 Patient Initials: _M_ _J_ _E_  Visit Interval:    []$   Screening/Baseline*    []$  Activation   []$  0.5 Month       []$  1 Month     []$  2 Month     []$  3 Month       []$  6 Month     [x]$  12 Month         []$  Unscheduled, reason for visit: _________________________________________  * For Screening / Baseline only the questions are based on 30 days prior to consent.  SECTION B:  COVID-19 Like Illness Symptoms   Has the subject experienced any cold, flu or COVID-19 symptoms since the last study visit? [x]$   No  (skip to section C)     []$  Yes, date of onset:  _____/_____ MMM/YYYY    If yes, check all symptoms that apply:   []$  Fevers or chills   []$   New or Worsening Cough  []$  Productive  []$  Dry     If yes to cough, indicate severity  []$  Constant  []$  Occasional, several per hour   []$  New or worsened shortness of breath   []$  Diarrhea   []$  Altered or reduced sense of smell or taste   []$  Muscle aches/Severe Fatigue   []$  Chest pain or tightness   []$  Sore throat   []$  Nausea or vomiting  SECTION C:  COVID-19 Like Illness Testing   Has the patient been tested for COVID-19 since the last study visit? [x]$  No     []$  Yes, date of test:  _____/_____ MMM/YYYY    If yes, test results:   []$  Positive []$  Negative []$  Unknown  Has the patient been tested for COVID-19 Antibodies since the last study visit? [x]$  No     []$  Yes, date of test:  _____/_____ MMM/YYYY    If yes, test results:   []$  Positive []$  Negative []$  Unknown  Has the patient been vaccinated for  COVID-19 since the last study visit? []$  No      [x]$  Yes, date of test:  __14/Dec_/_2023____ MMM/YYYY  Has the patient been tested for Influenza ("flu") since the last study visit? [x]$  No     []$  Yes, date of test:  _____/_____ MMM/YYYY    If yes, test results:   []$  Positive []$  Negative []$  Unknown  Has the patient been vaccinated for Influenza ("flu") since the last study visit? []$  No     [x]$  Yes, date of test:  _30 / Oct_/_2023____ MMM/YYYY  SECTION D:  COVID-19 Like Illness Exposure  Has the subject been told that they might have had COVID-19/have symptoms suggestive of COVID-19 since the last study visit? [x]$    No   []$  Yes   []$   Unknown  Has the subject been exposed to anyone with known or suspected COVID-19 since the last study visit? [x]$    No   []$  Yes   []$   Unknown  Has the subject been told that they might have had the "flu" or influenza since the last study visit? [x]$    No   []$  Yes   []$   Unknown  SECTION E:  Effects of COVID Pandemic on [place] Interactions  Since the last study visit, did the subject feel the need to go to an emergency department or hospital for their heart failure but decided not to because of concerns about COVID-19? [x]$   No   []$  Yes   []$  Unknown    If yes, how did they seek care (check all that apply):   []$  Telemedicine visit []$  In-person []$  Clinic []$  Urgent Care   []$  Subject did not change hospital/ER use due to COVID-19 []$  Other, specify: ________________________  Since the last study visit, did the subject have a cardiology/HF related appointment cancelled/rescheduled due to COVID-19 pandemic? [x]$   No   []$  Yes, how many? ___________   []$  Unknown  Since the last study visit, did the subject have any cardiology/HF related telemedicine visit due to COVID-19 pandemic? [x]$   No   []$  Yes, how many? ___________   []$  Unknown  Since the last study visit, did the subject have a cardiology/HF procedure cancelled/rescheduled due to COVID-19 pandemic? [x]$   No   []$  Yes, how many? ___________   []$   Unknown  SECTION F:  Effects of COVID Pandemic on Subject's Medications  Since the last visit, did any of their heart failure medications change or stop, even for a short time?   If yes, enter change into medication eCRF [x]$   No   []$  Yes, how many? ___________   []$  Unknown    If yes, why:   []$  Instructed by Doctor []$  Self-discontinued []$  Unknown []$  Other: ________________  Since the last visit, was the subject prescribed any medications for COVID-19? [x]$   No   []$  Yes   []$  Unknown    If yes, what medications (generic name):  SECTION G:  Effects of COVID Pandemic on Subject's Lifestyle  How has the subject's activity/exercise level changed due to COVID-19 pandemic? [x]$   No change   []$  More activity/exercise   []$  Less activity/exercise  How has the subject's smoking habits changed due to COVID-19? [x]$   Does not smoke   []$  No change   []$  Smoke more   []$  Smoke less  How has the subject's alcohol drinking habits changed due to COVID-19? []$   Does not drink   [x]$  No change   []$  Drink more   []$   Drink less  Section H:  Signature  Person completing form (Print Name): Philemon Kingdom :) __________________  Signature: Philemon Kingdom :) ____ Date: __01/25/2024________________________       Section A:  Administrative Section   Subject ID:  [id number] - _[id number]__ - 015  Subject Initials:  _M_ _J_ _E_  Visit Interval:   (*if required)   []$  3 Month       []$  6 Month       [x]$  12 Month       []$  Unscheduled*   Section B:  CTA   If the subject had a CTA at baseline, a CTA at 53-monthshould be performed   Was a CTA performed?  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  [x]$  No  []$  Yes (Specify side):       []$  Left   []$  Right  []$  Both     If yes is there a greater than 50% stenosis in either artery?  [x]$  No []$  Yes (Specify side**):        []$  Left   []$  Right  []$  Both   ** CTA should be completed if ?50% stenosis on the implanted side only at 112-monthisit      Section E:  1221-monthllow-up Only    Has the subject had new stenosis >40% since baseline in the artery where the lead was implanted?  [x]$  No      []$  Yes#, please indicate % new stenosis: _______%  (comment below)   Comments on determination of >40% stenosis:   # Subject must be followed after the 12-75-monthit, please see section 3.4.27 Final Study Visit at 12 Months   Section :  Comments    per Dr BrabTrula Slade of stenosis         Section G:  Signature       Person completing form (Print Name): __Kimberly LuttAlba Destine______      Signature: _KimPhilemon Kingdom___    Date: _02/07/2024______    Section A:  Administrative Section  Subject ID: _[id number]__ - 015 Subject Initials: _M_ _J_ _E_ Visit Interval:  []$   Baseline     []$  6 Month     [  x] 12 Month  Section B:  Cardiac Echo   Cardiac Echo Date:  _25__/__JAN_/_2024_ (DD / MMM / [place] Breeding)  [x]$  Images sent to [facility]  Heart rate _60_____ bpm Echo type: [x]$  2D Images Captured: (4-Chamber and Biplane required) [x]$  PS-LAX  Blood Pressure _101_/_57_ mmHg  []$  3D  [x]$  Biplane        [x]$  Apical 4-Chamber        []$  Other, specify:_______   LV End Diastolic Dimension [id number] mm   LV End Systolic Dimension [id number] mm   LA Volume ___16______  mL/m2   LV End-Diastolic Volume A999333 mL   LV End-Systolic Volume [id number]   LV Ejection Fraction ___30_____ %  Section C:  Comments  N/A          Section C:  Signature   Person completing form (Print Name): __Kimberly Parkin :) __________________  Signature: Philemon Kingdom :) ____ Date: __02/09/2024________________________    Current Outpatient Medications:    aspirin 81 MG chewable tablet, Chew 81 mg by mouth daily., Disp: , Rfl:    buPROPion (WELLBUTRIN XL) 300 MG 24 hr tablet, Take 300 mg by mouth daily., Disp: , Rfl:    carvedilol (COREG) 6.25 MG tablet, TAKE 1 AND 1/2 TABLETS(9.375 MG) BY MOUTH TWICE DAILY, Disp: 90 tablet, Rfl: 3   Cholecalciferol (VITAMIN D-3 PO), Take 1 tablet by mouth daily., Disp: , Rfl:    dapagliflozin propanediol (FARXIGA) 10 MG TABS tablet, Take 1 tablet (10 mg total) by mouth daily before breakfast., Disp: 90 tablet, Rfl: 3   Evolocumab (REPATHA SURECLICK) [id number] MG/ML SOAJ, INJECT 1 PEN INTO THE SKIN EVERY 14 DAYS, Disp: 2 mL, Rfl: 2   Multiple Vitamins-Minerals (CENTRUM WOMEN PO), Take 1 tablet by mouth daily., Disp: , Rfl:    sacubitril-valsartan (ENTRESTO) 24-26 MG, Take 1 tablet by mouth 2 (two) times daily., Disp: 180 tablet, Rfl: 3   sertraline (ZOLOFT) 100 MG tablet, Take 100 mg by mouth 2 (two) times daily., Disp: , Rfl:    spironolactone (ALDACTONE) 25 MG tablet, TAKE 1 TABLET EVERY DAY, Disp: 90 tablet, Rfl: 1

## 2022-06-29 NOTE — Telephone Encounter (Signed)
Advanced Heart Failure Patient Advocate Encounter  Patient inquired about assistance for Farxiga. PAN grant has run out. Provided a copy of current [employer] grant that is open until 07/18/22, and renewed grant to continue though 02/25.   The patient was approved for a [employer] that will help cover the cost of Delene Loll, Farxiga.  Total amount awarded, $10,000.  Effective: 07/19/22 - 07/19/23.  BIN [id number] PCN [id number] [id number] ID [id number]  Patient provided with approval and processing information in office.  Clista Bernhardt, CPhT Rx Patient Advocate Phone: [phone]

## 2022-07-14 LAB — ECHOCARDIOGRAM COMPLETE
AR max vel: 2.57 cm2
AV Area VTI: 2.47 cm2
AV Area mean vel: 2.7 cm2
AV Mean grad: 2 mmHg
AV Peak grad: 4.3 mmHg
Ao pk vel: 1.04 m/s
Area-P 1/2: 3.58 cm2
Calc EF: 38.8 %
P 1/2 time: 1122 msec
S' Lateral: 4.5 cm
Single Plane A2C EF: 46.5 %
Single Plane A4C EF: 30.7 %

## 2022-07-26 ENCOUNTER — Ambulatory Visit (INDEPENDENT_AMBULATORY_CARE_PROVIDER_SITE_OTHER): Payer: Medicare PPO

## 2022-07-26 DIAGNOSIS — I255 Ischemic cardiomyopathy: Secondary | ICD-10-CM | POA: Diagnosis not present

## 2022-07-26 LAB — CUP PACEART REMOTE DEVICE CHECK
Battery Remaining Longevity: 75 mo
Battery Voltage: 3 V
Brady Statistic RV Percent Paced: 0.01 %
Date Time Interrogation Session: 20240221043823
HighPow Impedance: 75 Ohm
Implantable Lead Connection Status: 753985
Implantable Lead Implant Date: 20190418
Implantable Lead Location: 753860
Implantable Pulse Generator Implant Date: 20190418
Lead Channel Impedance Value: 399 Ohm
Lead Channel Impedance Value: 456 Ohm
Lead Channel Pacing Threshold Amplitude: 0.75 V
Lead Channel Pacing Threshold Pulse Width: 0.4 ms
Lead Channel Sensing Intrinsic Amplitude: 21.125 mV
Lead Channel Sensing Intrinsic Amplitude: 21.125 mV
Lead Channel Setting Pacing Amplitude: 2.5 V
Lead Channel Setting Pacing Pulse Width: 0.4 ms
Lead Channel Setting Sensing Sensitivity: 0.3 mV
Zone Setting Status: 755011
Zone Setting Status: 755011

## 2022-07-27 NOTE — Research (Addendum)
Section A.   Administrative Section  Patient ID: [id number] - _024_ - 015 Patient Initials: _M_ _J_ _E_  Exit / Termination Date: __25__/_JAN_/_2024_                                            (DD / MMM / [place] Breeding)  Section B.   Reason for Exit / Termination  []$  Screening/baseline failure  []$  Failed Implant Attempt(s)  [x]$  Completed all Study Required Visits  []$  PI Withdrew  []$  Subject withdrew consent (add comment below)  []$  Subject refuses further follow-up testing (add comment below)  []$  Lost to Follow-up  []$  System Explanted (Complete Additional Procedure Worksheet)  []$  Deceased (Complete Subject Death Worksheet)  []$  Other, please specify: ________________________  Comments  N/a        Section C. Signature    Person completing form (Print Name): Philemon Kingdom :) __________  Signature: Philemon Kingdom :) ______ Date: __01/25/2024_______

## 2022-08-20 ENCOUNTER — Other Ambulatory Visit (HOSPITAL_COMMUNITY): Payer: Self-pay | Admitting: Internal Medicine

## 2022-08-23 NOTE — Progress Notes (Signed)
Remote ICD transmission.   

## 2022-10-11 ENCOUNTER — Encounter (HOSPITAL_COMMUNITY): Payer: Self-pay | Admitting: Internal Medicine

## 2022-10-11 ENCOUNTER — Ambulatory Visit (HOSPITAL_COMMUNITY)
Admission: RE | Admit: 2022-10-11 | Discharge: 2022-10-11 | Disposition: A | Discharge status: Home / Self Care | Payer: Medicare PPO | Source: Ambulatory Visit | Attending: Internal Medicine | Admitting: Internal Medicine

## 2022-10-11 VITALS — BP 110/60 | HR 72 | Wt 148.0 lb

## 2022-10-11 DIAGNOSIS — Z9181 History of falling: Secondary | ICD-10-CM | POA: Diagnosis not present

## 2022-10-11 DIAGNOSIS — L905 Scar conditions and fibrosis of skin: Secondary | ICD-10-CM | POA: Diagnosis not present

## 2022-10-11 DIAGNOSIS — I5022 Chronic systolic (congestive) heart failure: Secondary | ICD-10-CM | POA: Insufficient documentation

## 2022-10-11 DIAGNOSIS — I779 Disorder of arteries and arterioles, unspecified: Secondary | ICD-10-CM | POA: Diagnosis not present

## 2022-10-11 DIAGNOSIS — Z87891 Personal history of nicotine dependence: Secondary | ICD-10-CM | POA: Diagnosis not present

## 2022-10-11 DIAGNOSIS — J90 Pleural effusion, not elsewhere classified: Secondary | ICD-10-CM | POA: Diagnosis not present

## 2022-10-11 DIAGNOSIS — I110 Hypertensive heart disease with heart failure: Secondary | ICD-10-CM | POA: Diagnosis not present

## 2022-10-11 DIAGNOSIS — Z7984 Long term (current) use of oral hypoglycemic drugs: Secondary | ICD-10-CM | POA: Diagnosis not present

## 2022-10-11 DIAGNOSIS — Z79899 Other long term (current) drug therapy: Secondary | ICD-10-CM | POA: Diagnosis not present

## 2022-10-11 DIAGNOSIS — Z951 Presence of aortocoronary bypass graft: Secondary | ICD-10-CM | POA: Diagnosis not present

## 2022-10-11 DIAGNOSIS — I2510 Atherosclerotic heart disease of native coronary artery without angina pectoris: Secondary | ICD-10-CM | POA: Diagnosis not present

## 2022-10-11 DIAGNOSIS — Z7982 Long term (current) use of aspirin: Secondary | ICD-10-CM | POA: Diagnosis not present

## 2022-10-11 DIAGNOSIS — R296 Repeated falls: Secondary | ICD-10-CM | POA: Diagnosis not present

## 2022-10-11 DIAGNOSIS — J449 Chronic obstructive pulmonary disease, unspecified: Secondary | ICD-10-CM | POA: Insufficient documentation

## 2022-10-11 LAB — COMPREHENSIVE METABOLIC PANEL
ALT: 13 U/L (ref 0–44)
AST: 19 U/L (ref 15–41)
Albumin: 3.5 g/dL (ref 3.5–5.0)
Alkaline Phosphatase: 81 U/L (ref 38–126)
Anion gap: 8 (ref 5–15)
BUN: 17 mg/dL (ref 8–23)
CO2: 25 mmol/L (ref 22–32)
Calcium: 9.1 mg/dL (ref 8.9–10.3)
Chloride: 107 mmol/L (ref 98–111)
Creatinine, Ser: 0.87 mg/dL (ref 0.44–1.00)
GFR, Estimated: >60 mL/min (ref >60)
Glucose, Bld: 99 mg/dL (ref 70–99)
Potassium: 4 mmol/L (ref 3.5–5.1)
Sodium: 140 mmol/L (ref 135–145)
Total Bilirubin: 0.2 mg/dL — ABNORMAL LOW (ref 0.3–1.2)
Total Protein: 6.5 g/dL (ref 6.5–8.1)

## 2022-10-11 LAB — CBC
HCT: 38.8 % (ref 36.0–46.0)
Hemoglobin: 12.8 g/dL (ref 12.0–15.0)
MCH: 30.5 pg (ref 26.0–34.0)
MCHC: 33 g/dL (ref 30.0–36.0)
MCV: 92.4 fL (ref 80.0–100.0)
Platelets: 293 K/uL (ref 150–400)
RBC: 4.2 MIL/uL (ref 3.87–5.11)
RDW: 13.1 % (ref 11.5–15.5)
WBC: 6.3 K/uL (ref 4.0–10.5)
nRBC: 0 % (ref 0.0–0.2)

## 2022-10-11 LAB — TSH: TSH: 2.234 u[IU]/mL (ref 0.350–4.500)

## 2022-10-11 LAB — VITAMIN B12: Vitamin B-12: 410 pg/mL (ref 180–914)

## 2022-10-11 NOTE — Patient Instructions (Signed)
Good to see you today!  You have been referred to Physical therapy and occupational therapy. They will call you to schedule an appointment  Head CT without contrast ordered, they will call to schedule an appointment  Labs done today, your results will be available in MyChart, we will contact you for abnormal readings.  Your physician recommends that you schedule a follow-up appointment in: 6 months(November)Call office in September to schedule an appointment  If you have any questions or concerns before your next appointment please send [place] a message through [place] or call our office at [phone].    TO LEAVE A MESSAGE FOR THE NURSE SELECT OPTION 2, PLEASE LEAVE A MESSAGE INCLUDING: YOUR NAME DATE OF BIRTH CALL BACK NUMBER REASON FOR CALL**this is important as we prioritize the call backs  YOU WILL RECEIVE A CALL BACK THE SAME DAY AS LONG AS YOU CALL BEFORE 4:00 PM  At the [facility], you and your health needs are our priority. As part of our continuing mission to provide you with exceptional heart care, we have created designated Provider Care Teams. These Care Teams include your primary Cardiologist (physician) and Advanced Practice Providers (APPs- Physician Assistants and Nurse Practitioners) who all work together to provide you with the care you need, when you need it.   You may see any of the following providers on your designated Care Team at your next follow up: Dr Arvilla Meres Dr Marca Ancona Dr. Marcos Eke, NP Robbie Lis, [place] [facility] [place], [place] Brynda Peon, NP Karle Plumber, PharmD   Please be sure to bring in all your medications bottles to every appointment.    Thank you for choosing [facility]

## 2022-10-11 NOTE — Progress Notes (Signed)
[facility] Note   ID:  Zamariya, [name] 1945/12/18, MRN [id number]  Location: Home  Provider location: [address] [place] Type of Visit: Established patient  PCP:  Pcp, No  Primary HF: Dr [name]  Chief Complaint: chronic systolic heart failure, CAD   HPI:  Latoya Adams is a 77 y.o.female with history of CAD s/ CABG x 4 12/08/2016, chronic systolic CHF (EF 66-44% 09/2017) cMRI EF 24% with only small apex scar, s/p MDT ICD, tobacco use, COPD, hx of R pleural effusion with previous pleurex catheter, and chronic anemia.   Follow up 11/22, remained very fatigued. Entresto decreased to 24/26 bid. Echo 12/22 showed EF 30-35%, LV moderately decreased with global HK, grade I DD, RV low normal.  S/p Barostim 1/23.   Today she returns for HF follow up. Overall doing ok but has frequent falls (about once a month). Says she feels "clumsy" and loses her balance. Denies  syncope, presyncope. Says she just can't catch herself. Has broken her arm and bruised her ribs. No CP. SBP constant 101-115. No orthostasis. Stable DOE but doesn't do much due to hesitancy to fall. No edema, orthopnea or PND.   Echo 1/24 EF 25-30% Personally reviewed   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   Alcohol abuse    Anxiety    Arthritis    CHF (congestive heart failure) (HCC)    Coronary artery disease    Depression    Dyspnea    due to Pulmonary Effusion   History of blood transfusion 2018   during CABG   Hypertension    Myocardial infarction (HCC) 12/2016   Past Surgical History:  Procedure Laterality Date   BAROREFLEX SYSTEM INSERTION N/A 06/15/2021   Procedure: BAROREFLEX SYSTEM INSERTION;  Surgeon: Hillis Range, MD;  Location: [facility] INVASIVE CV LAB;  Service: Cardiovascular;  Laterality: N/A;   CHEST TUBE INSERTION Right 03/06/2017   Procedure: INSERTION PLEURAL DRAINAGE CATHETER;  Surgeon: Kerin Perna, MD;  Location: [facility] OR;  Service: Thoracic;  Laterality: Right;   COLONOSCOPY     CORONARY ARTERY BYPASS GRAFT N/A 12/08/2016   Procedure: CORONARY ARTERY BYPASS GRAFTING (CABG)x4 using left internal mammary artery and bilateral greater saphenous veins harvested endoscopically;  Surgeon: Kerin Perna, MD;  Location: [facility] OR;  Service: Open Heart Surgery;  Laterality: N/A;   dental implants     ICD IMPLANT  09/20/2017   ICD IMPLANT N/A 09/20/2017   Procedure: ICD IMPLANT;  Surgeon: Regan Lemming, MD;  Location: [facility] INVASIVE CV LAB;  Service: Cardiovascular;  Laterality: N/A;   IR THORACENTESIS ASP PLEURAL SPACE W/IMG GUIDE  01/24/2017   LEFT HEART CATH AND CORONARY ANGIOGRAPHY N/A 12/03/2016   Procedure: Left Heart Cath and Coronary Angiography;  Surgeon: Tonny Bollman, MD;  Location: [facility] INVASIVE CV LAB;  Service: Cardiovascular;  Laterality: N/A;   REMOVAL OF PLEURAL DRAINAGE CATHETER Right 06/12/2017   Procedure: REMOVAL OF PLEURAL DRAINAGE CATHETER;  Surgeon: Kerin Perna, MD;  Location: [facility] OR;  Service: Thoracic;  Laterality: Right;   RIGHT HEART CATH N/A 12/05/2016   Procedure: Right Heart Cath;  Surgeon: Dolores Patty, MD;  Location: [facility] INVASIVE CV LAB;  Service: Cardiovascular;  Laterality: N/A;   TEE WITHOUT CARDIOVERSION N/A 12/08/2016   Procedure: TRANSESOPHAGEAL ECHOCARDIOGRAM (TEE);  Surgeon: Donata Clay, Theron Arista, MD;  Location: [facility] OR;  Service: Open Heart Surgery;  Laterality: N/A;  TOTAL HIP ARTHROPLASTY Left 03/04/2019   Procedure: LEFT TOTAL HIP ARTHROPLASTY ANTERIOR APPROACH;  Surgeon: Kathryne Hitch, MD;  Location: [facility] OR;  Service: Orthopedics;  Laterality: Left;   TUBAL LIGATION     Current Outpatient Medications  Medication Sig Dispense Refill   aspirin 81 MG chewable tablet Chew 81 mg by mouth daily.     buPROPion (WELLBUTRIN XL) 300 MG 24 hr tablet Take 300 mg by mouth daily.     carvedilol (COREG) 6.25 MG tablet TAKE 1 AND 1/2 TABLETS(9.375 MG)  BY MOUTH TWICE DAILY 90 tablet 3   Cholecalciferol (VITAMIN D-3 PO) Take 1 tablet by mouth daily.     dapagliflozin propanediol (FARXIGA) 10 MG TABS tablet Take 1 tablet (10 mg total) by mouth daily before breakfast. 90 tablet 3   Evolocumab (REPATHA SURECLICK) 140 MG/ML SOAJ INJECT THE CONTENTS OF 1 PEN UNDER THE SKIN EVERY 14 DAYS 2 mL 2   Multiple Vitamins-Minerals (CENTRUM WOMEN PO) Take 1 tablet by mouth daily.     sacubitril-valsartan (ENTRESTO) 24-26 MG Take 1 tablet by mouth 2 (two) times daily. 180 tablet 3   sertraline (ZOLOFT) 100 MG tablet Take 100 mg by mouth 2 (two) times daily.     spironolactone (ALDACTONE) 25 MG tablet TAKE 1 TABLET EVERY DAY 90 tablet 1   No current facility-administered medications for this encounter.    Allergies:   Codeine, Atorvastatin, Rosuvastatin, and Varenicline tartrate   Social History:  The patient  reports that she quit smoking about 5 years ago. Her smoking use included cigarettes. She has never used smokeless tobacco. She reports that she does not drink alcohol and does not use drugs.   Family History:  [name] family history includes CAD in her sister; Heart block in her father, mother, and sister.   ROS:  Please see the history of present illness.   All other systems are personally reviewed and negative.   There were no vitals taken for this visit.  Wt Readings from Last 3 Encounters:  06/29/22 68.1 kg (150 lb 3.2 oz)  05/03/22 66.1 kg (145 lb 12.8 oz)  01/11/22 66.2 kg (146 lb)   Recent Labs: 05/03/2022: ALT 20; B Natriuretic Peptide 260.7; BUN 16; Creatinine, Ser 0.94; Hemoglobin 12.9; Platelets 272; Potassium 3.9; Sodium 137  Personally reviewed   Physical Exam:   General:  Well appearing. No resp difficulty HEENT: normal Neck: supple. no JVD. Carotids 2+ bilat; no bruits. No lymphadenopathy or thryomegaly appreciated. Cor: PMI nondisplaced. Regular rate & rhythm. No rubs, gallops or murmurs. Lungs: decreased  throughout Abdomen: soft, nontender, nondistended. No hepatosplenomegaly. No bruits or masses. Good bowel sounds. Extremities: no cyanosis, clubbing, rash, edema Neuro: alert & orientedx3, cranial nerves grossly intact. moves all 4 extremities w/o difficulty. Affect pleasant.FTN ok. Tandem walk slight imbalance. Romberg ok  ECG: NSR 67 Personally reviewed   Device Interrogation (personally reviewed): OptiVol down, 1-2 hours day/activity, no AF or VT Personally reviewed  Assessment & Plan: 1. Chronic systolic HF due to ICM - Pre-op echo (7/18) EF 20-25% with normal RV - Pre-op cMRI (7/18) EF 24% with only small scar in apex. RHC with normal CO and no PAH - s/p CABG x 4. 12/08/16 - Echo 04/2017 EF 20-25%. Echo 09/2017 EF 20%, RV normal. S/p MDT ICD 09/2017. - Echo 09/2017 EF 25%, grade 1 DD, mild AI, mild MR - Echo 12/26/18 EF 30-35% Mild AI. RV ok  - Echo 02/25/20 EF 30-35% RV ok  - Echo (12/22):  EF 30-35%, RV low normal - Echo 7/23 EF 30% moderate MR RV mildly down  - Echo 1/24 EF 25-30% Mild MR Personally reviewed - s/p Barostim 1/23 - ICD. Interrogated personally. No VT/AF Optivol minimally elevated - Stable NYHA II-III Volume status looks good - Continue carvedilol to 9.375 mg bid. (Cut back due to dizziness) - Continue Farxiga 10 mg daily. - Continue Entresto 24/26 mg bid. - Continue spiro 25 mg daily.   - Labs today  2. CAD - S/P CABG x4 on 12/08/16 - No s/s angina - Continue ASA 81 mg daily.  - She has stopped her atorvastatin, she discussed this with Vascular. - Continue Repatha. Followed by [facility]   3. Recurrent R pleural effusion - S/p Pleurex removal 06/2017. - No recurrence.   4. PAD/claudication  - No claudication. - ABIs 09/2017: bilateral TBI abnormal.  - Aortoiliac 10/2017: significant left common and external iliac artery disease. VVS consult recommended. She saw Dr Kirke Corin 11/06/17 and wanted to continue medical therapy for now.   5. Tobacco use/COPD - PFTs show  severe COPD.  - No longer smoking  6. Falls - check head CT - refer PT/OT - check B12, TSH, CBC - may need Neuro referral  7. Pre-op clearance for cataracts - ok to proceed. Low-risk.   Signed, Arvilla Meres, MD  10/11/2022 12:38 AM  [facility] [address] [facility] [place] [address] [phone] (office) [phone] (fax)

## 2022-10-13 ENCOUNTER — Ambulatory Visit: Payer: Medicare PPO | Attending: Internal Medicine | Admitting: Occupational Therapy

## 2022-10-13 ENCOUNTER — Encounter: Payer: Self-pay | Admitting: Occupational Therapy

## 2022-10-13 DIAGNOSIS — R2681 Unsteadiness on feet: Secondary | ICD-10-CM | POA: Diagnosis present

## 2022-10-13 DIAGNOSIS — I5022 Chronic systolic (congestive) heart failure: Secondary | ICD-10-CM | POA: Diagnosis not present

## 2022-10-13 DIAGNOSIS — M6281 Muscle weakness (generalized): Secondary | ICD-10-CM

## 2022-10-13 NOTE — Therapy (Signed)
OUTPATIENT OCCUPATIONAL THERAPY EVALUATION  Patient Name: Latoya Adams MRN: [id number] DOB:1945/07/29, 77 y.o., female Today's Date: 10/13/2022  PCP: no PCP REFERRING PROVIDER: Dolores Patty, Adams  END OF SESSION:  OT End of Session - 10/13/22 1322     Visit Number 1    Number of Visits 9    Date for OT Re-Evaluation 12/08/22    Authorization Type [employer] - auth required    Progress Note Due on Visit 9    OT Start Time 1321    OT Stop Time 1411    OT Time Calculation (min) 50 min    Activity Tolerance Patient tolerated treatment well    Behavior During Therapy [facility] for tasks assessed/performed            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   Alcohol abuse    Anxiety    Arthritis    CHF (congestive heart failure) (HCC)    Coronary artery disease    Depression    Dyspnea    due to Pulmonary Effusion   History of blood transfusion 2018   during CABG   Hypertension    Myocardial infarction (HCC) 12/2016   Past Surgical History:  Procedure Laterality Date   BAROREFLEX SYSTEM INSERTION N/A 06/15/2021   Procedure: BAROREFLEX SYSTEM INSERTION;  Surgeon: Latoya Range, Adams;  Location: [facility] INVASIVE CV LAB;  Service: Cardiovascular;  Laterality: N/A;   CHEST TUBE INSERTION Right 03/06/2017   Procedure: INSERTION PLEURAL DRAINAGE CATHETER;  Surgeon: Latoya Perna, Adams;  Location: [facility] OR;  Service: Thoracic;  Laterality: Right;   COLONOSCOPY     CORONARY ARTERY BYPASS GRAFT N/A 12/08/2016   Procedure: CORONARY ARTERY BYPASS GRAFTING (CABG)x4 using left internal mammary artery and bilateral greater saphenous veins harvested endoscopically;  Surgeon: Latoya Perna, Adams;  Location: [facility] OR;  Service: Open Heart Surgery;  Laterality: N/A;   dental implants     ICD IMPLANT  09/20/2017   ICD IMPLANT N/A 09/20/2017   Procedure: ICD IMPLANT;  Surgeon: Latoya Lemming, Adams;  Location: [facility] INVASIVE CV LAB;   Service: Cardiovascular;  Laterality: N/A;   IR THORACENTESIS ASP PLEURAL SPACE W/IMG GUIDE  01/24/2017   LEFT HEART CATH AND CORONARY ANGIOGRAPHY N/A 12/03/2016   Procedure: Left Heart Cath and Coronary Angiography;  Surgeon: Latoya Bollman, Adams;  Location: [facility] INVASIVE CV LAB;  Service: Cardiovascular;  Laterality: N/A;   REMOVAL OF PLEURAL DRAINAGE CATHETER Right 06/12/2017   Procedure: REMOVAL OF PLEURAL DRAINAGE CATHETER;  Surgeon: Latoya Perna, Adams;  Location: [facility] OR;  Service: Thoracic;  Laterality: Right;   RIGHT HEART CATH N/A 12/05/2016   Procedure: Right Heart Cath;  Surgeon: Latoya Patty, Adams;  Location: [facility] INVASIVE CV LAB;  Service: Cardiovascular;  Laterality: N/A;   TEE WITHOUT CARDIOVERSION N/A 12/08/2016   Procedure: TRANSESOPHAGEAL ECHOCARDIOGRAM (TEE);  Surgeon: Latoya Adams, Latoya Arista, Adams;  Location: [facility] OR;  Service: Open Heart Surgery;  Laterality: N/A;   TOTAL HIP ARTHROPLASTY Left 03/04/2019   Procedure: LEFT TOTAL HIP ARTHROPLASTY ANTERIOR APPROACH;  Surgeon: Latoya Hitch, Adams;  Location: [facility] OR;  Service: Orthopedics;  Laterality: Left;   TUBAL LIGATION     Patient Active Problem List   Diagnosis Date Noted   History of recent fall 10/11/2022   CHF (congestive heart failure), NYHA class III, chronic, combined (HCC) 06/15/2021   Preop testing 06/07/2021   Hyperlipidemia 02/25/2020   Status post total replacement of left  hip 03/04/2019   Unilateral primary osteoarthritis, left hip 01/01/2019   Ischemic cardiomyopathy 09/20/2017   Chronic systolic CHF (congestive heart failure) (HCC) 01/08/2017   Tobacco use 01/08/2017   S/P CABG x 4 12/08/2016   Centrilobular emphysema (HCC) 12/06/2016   CAD in native artery 12/06/2016   NSTEMI (non-ST elevated myocardial infarction) (HCC) 12/03/2016   Hypertension, essential 12/03/2016    ONSET DATE: 10/11/2022 (date of referral)  REFERRING DIAG: I50.22 (ICD-10-CM) - Chronic systolic CHF (congestive heart failure)  THERAPY  DIAG:  Muscle weakness (generalized)  Unsteadiness on feet  Rationale for Evaluation and Treatment: Rehabilitation  SUBJECTIVE:   SUBJECTIVE STATEMENT: Falls at least once a month. States her cardiologist has been concerned about her falling and her children are also concerned. She fell and broke her L arm 3 years ago, which "never healed right". She also endorses falling on her L hip with pain that shoots down the front of her leg. She "just loses her balance". She has had falls inside the home (including tripping over her dog) and when outside at a wedding. Her children want her to use a walker. Her cardiologist told her she may just need a 3 prong cane.   Head CT ordered. Reports paresthesias in LUE especially in bed.    She has cataract extraction next week. She usually wears contacts but has had to go without wearing them due to the upcoming procedure.   She does not own a life alert though her children want one for her. Typically when she falls, she gets onto all fours and then crawls to where she can pull herself up. Overall, there are a lot of times where she just wants to sit in the chair all day and is not motivated to get up and do things.   Pt accompanied by: self  PERTINENT HISTORY: "Latoya Adams is a 77 y.o.female with history of CAD s/ CABG x 4 12/08/2016, chronic systolic CHF (EF 16-10% 09/2017) cMRI EF 24% with only small apex scar, s/p MDT ICD, tobacco use, COPD, hx of R pleural effusion with previous pleurex catheter, and chronic anemia.    Follow up 11/22, remained very fatigued. Entresto decreased to 24/26 bid. Echo 12/22 showed EF 30-35%, LV moderately decreased with global HK, grade I DD, RV low normal.   S/p Barostim 1/23.    Today she returns for HF follow up. Overall doing ok but has frequent falls (about once a month). Says she feels "clumsy" and loses her balance. Denies  syncope, presyncope. Says she just can't catch herself. Has broken her arm and bruised her  ribs. No CP. SBP constant 101-115. No orthostasis. Stable DOE but doesn't do much due to hesitancy to fall. No edema, orthopnea or PND.    Echo 1/24 EF 25-30% Personally reviewed"  PRECAUTIONS: Fall, ICD/Pacemaker, and Other: BAROREFLEX transmitter on R side.  WEIGHT BEARING RESTRICTIONS: No  PAIN:  Are you having pain? Yes: NPRS scale: 3/10 Pain location: L leg Pain description: nerve pain at front of leg, she states it is not sciatica Aggravating factors: walking, positioning Relieving factors: rest  FALLS: Has patient fallen in last 6 months? Yes. Number of falls 8  LIVING ENVIRONMENT: Lives with: lives with their son and grandchildren (16, 58, and 59) Lives in: House/apartment Stairs: Yes: External: 3 steps; on right going up Has following equipment at home: Environmental consultant - 4 wheeled [employer], no grab bars or chairs  PLOF: Independent; retired worked for [employer]  team  PATIENT GOALS: To reduce risk of falls  NEXT Adams VISIT: Sees Cardiology in 6 months  OBJECTIVE:   HAND DOMINANCE: Right  ADLs: Overall ADLs: mod I Bathing: sponge bathing, prefers baths   FUNCTIONAL OUTCOME MEASURES: PSFS: 1.0   Total score = sum of the activity scores/number of activities Minimum detectable change (90%CI) for average score = 2 points Minimum detectable change (90%CI) for single activity score = 3 points  UPPER EXTREMITY ROM:     AROM Right (eval) Left (eval)  Shoulder flexion WNL WNL  Shoulder abduction WNL WNL  Elbow flexion WNL WNL  Elbow extension WNL WNL  Wrist flexion WNL WNL  Wrist extension WNL WNL  Wrist pronation WNL WNL  Wrist supination WNL WNL  Digit Composite Flexion WNL WNL  Digit Composite Extension WNL WNL  Digit Opposition WNL WNL  (Blank rows = not tested)  UPPER EXTREMITY MMT:     MMT Right (eval) Left (eval)  Shoulder flexion WNL WNL  Shoulder abduction WNL WNL  Elbow flexion WNL WNL  Elbow extension WNL WNL   (Blank rows = not tested)  Poor endurance on L  HAND FUNCTION: Grip strength: Right: 54 lbs; Left: 10.3 lbs  COORDINATION: 9 Hole Peg test: Right: 22 sec; Left: 30 sec  SENSATION: Paresthesias reported from forearm to finger tips since fracture  EDEMA: none reported or observed  COGNITION: Overall cognitive status: Within functional limits for tasks assessed  OBSERVATIONS: Altered gait though no LOB. Does not use AD. Appears well-kept with glasses donned.   TODAY'S TREATMENT:                                                                                                                               OT provided pt with information on life alert vs smart watch to help detect falls and call necessary help.   OT provided education on potential home modifications to include walk-in bathtub with overhead shower, shower stool, and installation of grab bars or rails in home to help reduce risk of falls.   PATIENT EDUCATION: Education details: OT Role and POC Person educated: Patient Education method: Explanation Education comprehension: verbalized understanding and needs further education  HOME EXERCISE PROGRAM: None at this time  GOALS:  SHORT TERM GOALS: Target date: 11/10/2022    Patient will demonstrate independence with LUE HEP. Baseline:not yet initiated Goal status: INITIAL  2.  Pt will demonstrate at least 5 lb improvement to L grip strength (15.3 lbs) as needed to help hold and carry items in hand.  Baseline: 10.3 lbs Goal status: INITIAL   LONG TERM GOALS: Target date: 12/08/2022    .Patient will report at least two-point increase in average PSFS score or at least three-point increase in a single activity score indicating functionally significant improvement given minimum detectable change.  Baseline: 1 total score (See above for individual activity scores) Goal status: INITIAL  2.  Pt will independently recall at least  2 safety strategies/use of AD for  making spaghetti to help improve fall risk.  Baseline: not yet initiated Goal status: INITIAL   ASSESSMENT:  CLINICAL IMPRESSION: Patient is a 77 y.o. female who was seen today for occupational therapy evaluation for history of repeated falls with CHF. Hx includes CAD, MI with CABG x 4 (2018), CHF (EF 20-25% 09/2017), s/p MDT ICD, tobacco use, COPD, hx of R pleural effusion with previous pleurex catheter, anxiety, arthritis, depression, HTN, LTHA (2020), Baroreflex system insert (2023), and chronic anemia. Patient currently presents below baseline level of functioning demonstrating functional deficits and impairments as noted below. Pt would benefit from skilled OT services in the outpatient setting to work on impairments as noted below to help pt return to PLOF as able.    PERFORMANCE DEFICITS: in functional skills including ADLs, IADLs, sensation, strength, pain, mobility, balance, body mechanics, decreased knowledge of precautions, decreased knowledge of use of DME, and UE functional use.  IMPAIRMENTS: are limiting patient from ADLs, IADLs, rest and sleep, and social participation.   COMORBIDITIES: may have co-morbidities  that affects occupational performance. Patient will benefit from skilled OT to address above impairments and improve overall function.  MODIFICATION OR ASSISTANCE TO COMPLETE EVALUATION: Min-Moderate modification of tasks or assist with assess necessary to complete an evaluation.  OT OCCUPATIONAL PROFILE AND HISTORY: Problem focused assessment: Including review of records relating to presenting problem.  CLINICAL DECISION MAKING: LOW - limited treatment options, no task modification necessary  REHAB POTENTIAL: Good  EVALUATION COMPLEXITY: Low     PLAN:  OT FREQUENCY: 1x/week  OT DURATION: up to 8 weeks  PLANNED INTERVENTIONS: self care/ADL training, therapeutic exercise, therapeutic activity, functional mobility training, patient/family education, DME and/or AE  instructions, and Re-evaluation  RECOMMENDED OTHER SERVICES: Pt has PT referral but has not been scheduled at time of eval. Pt was sent to schedule PT eval with return OT visits.   CONSULTED AND AGREED WITH PLAN OF CARE: Patient  PLAN FOR NEXT SESSION: LUE strengthening; home safety   Delana Meyer, OT 10/13/2022, 3:08 PM

## 2022-10-23 ENCOUNTER — Telehealth (HOSPITAL_COMMUNITY): Payer: Self-pay

## 2022-10-23 NOTE — Telephone Encounter (Signed)
Patient called and stated she needed her PT referral sent again.  She said it was a long story, but they wanted it sent over again.

## 2022-10-23 NOTE — Addendum Note (Signed)
Encounter addended by: Noralee Space, RN on: 10/23/2022 3:49 PM  Actions taken: Order list changed, Diagnosis association updated

## 2022-10-23 NOTE — Telephone Encounter (Signed)
New PT order placed.

## 2022-10-25 ENCOUNTER — Ambulatory Visit: Payer: Medicare PPO

## 2022-10-25 ENCOUNTER — Ambulatory Visit (INDEPENDENT_AMBULATORY_CARE_PROVIDER_SITE_OTHER): Payer: Medicare PPO

## 2022-10-25 DIAGNOSIS — I5022 Chronic systolic (congestive) heart failure: Secondary | ICD-10-CM

## 2022-10-25 LAB — CUP PACEART REMOTE DEVICE CHECK
Battery Remaining Longevity: 69 mo
Battery Voltage: 2.99 V
Brady Statistic RV Percent Paced: 0 %
Date Time Interrogation Session: 20240522012304
HighPow Impedance: 80 Ohm
Implantable Lead Connection Status: 753985
Implantable Lead Implant Date: 20190418
Implantable Lead Location: 753860
Implantable Pulse Generator Implant Date: 20190418
Lead Channel Impedance Value: 361 Ohm
Lead Channel Impedance Value: 456 Ohm
Lead Channel Pacing Threshold Amplitude: 0.75 V
Lead Channel Pacing Threshold Pulse Width: 0.4 ms
Lead Channel Sensing Intrinsic Amplitude: 20.875 mV
Lead Channel Sensing Intrinsic Amplitude: 20.875 mV
Lead Channel Setting Pacing Amplitude: 2.5 V
Lead Channel Setting Pacing Pulse Width: 0.4 ms
Lead Channel Setting Sensing Sensitivity: 0.3 mV
Zone Setting Status: 755011
Zone Setting Status: 755011

## 2022-11-01 ENCOUNTER — Ambulatory Visit: Payer: Medicare PPO

## 2022-11-06 ENCOUNTER — Other Ambulatory Visit (HOSPITAL_COMMUNITY): Payer: Self-pay | Admitting: Internal Medicine

## 2022-11-06 ENCOUNTER — Ambulatory Visit: Payer: Medicare PPO | Admitting: Physical Therapy

## 2022-11-06 ENCOUNTER — Ambulatory Visit: Payer: Medicare PPO

## 2022-11-13 ENCOUNTER — Encounter: Payer: Medicare PPO | Admitting: Occupational Therapy

## 2022-11-13 ENCOUNTER — Ambulatory Visit: Payer: Medicare PPO | Admitting: Physical Therapy

## 2022-11-15 ENCOUNTER — Ambulatory Visit: Payer: Medicare PPO | Attending: Internal Medicine

## 2022-11-15 DIAGNOSIS — R296 Repeated falls: Secondary | ICD-10-CM | POA: Insufficient documentation

## 2022-11-15 DIAGNOSIS — R2681 Unsteadiness on feet: Secondary | ICD-10-CM | POA: Insufficient documentation

## 2022-11-15 DIAGNOSIS — M6281 Muscle weakness (generalized): Secondary | ICD-10-CM | POA: Insufficient documentation

## 2022-11-15 DIAGNOSIS — I5022 Chronic systolic (congestive) heart failure: Secondary | ICD-10-CM | POA: Diagnosis not present

## 2022-11-15 NOTE — Therapy (Signed)
OUTPATIENT PHYSICAL THERAPY LOWER EXTREMITY EVALUATION   Patient Name: Latoya Adams MRN: [id number] DOB:10-15-1945, 77 y.o., female Today's Date: 11/15/2022  END OF SESSION:  PT End of Session - 11/15/22 1149     Visit Number 1    Number of Visits 16    Date for PT Re-Evaluation 01/13/23    PT Start Time 1132    PT Stop Time 1212    PT Time Calculation (min) 40 min    Activity Tolerance Patient tolerated treatment well    Behavior During Therapy [facility] for tasks assessed/performed             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   Alcohol abuse    Anxiety    Arthritis    CHF (congestive heart failure) (HCC)    Coronary artery disease    Depression    Dyspnea    due to Pulmonary Effusion   History of blood transfusion 2018   during CABG   Hypertension    Myocardial infarction (HCC) 12/2016   Past Surgical History:  Procedure Laterality Date   BAROREFLEX SYSTEM INSERTION N/A 06/15/2021   Procedure: BAROREFLEX SYSTEM INSERTION;  Surgeon: Hillis Range, MD;  Location: [facility] INVASIVE CV LAB;  Service: Cardiovascular;  Laterality: N/A;   CHEST TUBE INSERTION Right 03/06/2017   Procedure: INSERTION PLEURAL DRAINAGE CATHETER;  Surgeon: Kerin Perna, MD;  Location: [facility] OR;  Service: Thoracic;  Laterality: Right;   COLONOSCOPY     CORONARY ARTERY BYPASS GRAFT N/A 12/08/2016   Procedure: CORONARY ARTERY BYPASS GRAFTING (CABG)x4 using left internal mammary artery and bilateral greater saphenous veins harvested endoscopically;  Surgeon: Kerin Perna, MD;  Location: [facility] OR;  Service: Open Heart Surgery;  Laterality: N/A;   dental implants     ICD IMPLANT  09/20/2017   ICD IMPLANT N/A 09/20/2017   Procedure: ICD IMPLANT;  Surgeon: Regan Lemming, MD;  Location: [facility] INVASIVE CV LAB;  Service: Cardiovascular;  Laterality: N/A;   IR THORACENTESIS ASP PLEURAL SPACE W/IMG GUIDE  01/24/2017   LEFT HEART CATH AND  CORONARY ANGIOGRAPHY N/A 12/03/2016   Procedure: Left Heart Cath and Coronary Angiography;  Surgeon: Tonny Bollman, MD;  Location: [facility] INVASIVE CV LAB;  Service: Cardiovascular;  Laterality: N/A;   REMOVAL OF PLEURAL DRAINAGE CATHETER Right 06/12/2017   Procedure: REMOVAL OF PLEURAL DRAINAGE CATHETER;  Surgeon: Kerin Perna, MD;  Location: [facility] OR;  Service: Thoracic;  Laterality: Right;   RIGHT HEART CATH N/A 12/05/2016   Procedure: Right Heart Cath;  Surgeon: Dolores Patty, MD;  Location: [facility] INVASIVE CV LAB;  Service: Cardiovascular;  Laterality: N/A;   TEE WITHOUT CARDIOVERSION N/A 12/08/2016   Procedure: TRANSESOPHAGEAL ECHOCARDIOGRAM (TEE);  Surgeon: Donata Clay, Theron Arista, MD;  Location: [facility] OR;  Service: Open Heart Surgery;  Laterality: N/A;   TOTAL HIP ARTHROPLASTY Left 03/04/2019   Procedure: LEFT TOTAL HIP ARTHROPLASTY ANTERIOR APPROACH;  Surgeon: Kathryne Hitch, MD;  Location: [facility] OR;  Service: Orthopedics;  Laterality: Left;   TUBAL LIGATION     Patient Active Problem List   Diagnosis Date Noted   History of recent fall 10/11/2022   CHF (congestive heart failure), NYHA class III, chronic, combined (HCC) 06/15/2021   Preop testing 06/07/2021   Hyperlipidemia 02/25/2020   Status post total replacement of left hip 03/04/2019   Unilateral primary osteoarthritis, left hip 01/01/2019   Ischemic cardiomyopathy 09/20/2017   Chronic systolic CHF (congestive heart failure) (HCC) 01/08/2017  Tobacco use 01/08/2017   S/P CABG x 4 12/08/2016   Centrilobular emphysema (HCC) 12/06/2016   CAD in native artery 12/06/2016   NSTEMI (non-ST elevated myocardial infarction) (HCC) 12/03/2016   Hypertension, essential 12/03/2016    PCP: Pcp, No  REFERRING PROVIDER: Chronic systolic CHF (congestive heart failure) (HCC) [I50.22], Falls frequently [R29.6]   REFERRING DIAG: Bensimhon, Bevelyn Buckles, MD   THERAPY DIAG:  Muscle weakness (generalized)  Unsteadiness on feet  Rationale for  Evaluation and Treatment: Rehabilitation  ONSET DATE: Many years  SUBJECTIVE:   SUBJECTIVE STATEMENT: Patient reports that she feels that she is very clumsy, and has been falling frequently for many years. She endorses that she recently had a fall where she injured her L arm and once bruised her ribs from falling.  She states that she has the most difficulty with her balance when she is in a new place, on uneven surface, or pivoting. "I have to hold on to someone." She also reports history of sciatic pain in L LE as well as history of L hip replacement 4 years ago.   "Once I'm down on the ground, I can't really get back up." However, she endorses that she is able to get to hands/knees and pull herself up."  She states that she is "just sitting in a chair" and would like to get back to regular life. "My grown kids are pretty concerned about me."   Patient confirms that her SBP is typically 101-115.   PERTINENT HISTORY: PMHx includes CHF, AICD present, anxiety, depression, CAD, dyspnea d/t pulmonary effusion, HTN, MI, CABG x 4 (2018), neurotransmitter implanted  PAIN:  Pain description: N/T in L LE that is sharp proximally, aching distally  PRECAUTIONS: Fall  WEIGHT BEARING RESTRICTIONS: No  FALLS:  Has patient fallen in last 6 months? Yes. Number of falls several, approx 1x/month  LIVING ENVIRONMENT: Lives with: lives with their family Lives in: House/apartment Stairs: Yes: Internal: 2 steps; on right going up and External: 2 steps; bilateral Has following equipment at home: None  OCCUPATION: N/A  PLOF: Independent  PATIENT GOALS: Patient would like to be able to function without fear of losing her balance.   NEXT MD VISIT: 11/16/22 cardiology   OBJECTIVE:    PATIENT SURVEYS:  FOTO 53 current, 60 predicted   COGNITION: Overall cognitive status: Within functional limits for tasks assessed      LOWER EXTREMITY MMT:   Grossly 4-/5 bilaterally    FUNCTIONAL TESTS:   TO BE ASSESSED AT FOLLOW UP   GAIT: Distance walked: 100 ft Assistive device utilized: None Level of assistance: SBA Comments: pt demonstrating small step length/decreased stance time with decreased knee flexion in swing phase and minimal pelvic tilt throughout gait phases; improvements noted with use of SPC   BALANCE:   ON GROUND:   Romberg EO: 30"  Romberg EC: 30"  L Tandem: 5"  R Tandem: 5"   ON FOAM:   Romberg EO: 30"  Romberg EC: 5"   L Tandem: 2"  R Tandem: 2"  [facility] Adult PT Treatment:                                                DATE: 11/15/2022  Therapeutic Exercise: Created, provided and reviewed home exercise program as noted below   PATIENT EDUCATION:  Education details: regarding components of balance, noted  deficits, prognosis and POC; discussed recommendation for use of SPC for safety and decreased risk of falls.  Person educated: Patient Education method: Explanation, Demonstration, and Handouts Education comprehension: verbalized understanding, returned demonstration, and needs further education  HOME EXERCISE PROGRAM: Access Code: [email or web address] URL: [email or web address] Date: 11/16/2022 Prepared by: Mauri Reading  Exercises - Heel Toe Raises with Counter Support  - 1 x daily - 7 x weekly - 2-3 sets - 10 reps - Standing Hip Abduction with Counter Support  - 1 x daily - 7 x weekly - 2-3 sets - 10 reps - Standing Hip Extension with Counter Support  - 1 x daily - 7 x weekly - 2-3 sets - 10 reps - Mini Squat with Counter Support  - 1 x daily - 7 x weekly - 2-3 sets - 10 reps - Seated Ankle Dorsiflexion with Resistance  - 1 x daily - 7 x weekly - 2-3 sets - 10 reps - Seated Ankle Eversion with Resistance  - 1 x daily - 7 x weekly - 2-3 sets - 10 reps  ASSESSMENT:  CLINICAL IMPRESSION: Patient is a 77 y.o. female who was seen today for physical therapy evaluation and treatment for balance deficits, related to frequent falls. She is  demonstrating decreased static standing balance and altered gait mechanics, as well as decreased gross LE strength. She will benefit from skilled PT services to address relevant deficits and maximize safe and independent function.   OBJECTIVE IMPAIRMENTS: Abnormal gait, decreased activity tolerance, decreased balance, decreased knowledge of use of DME, difficulty walking, decreased strength, decreased safety awareness, impaired perceived functional ability, and improper body mechanics.   ACTIVITY LIMITATIONS: carrying, bending, standing, squatting, stairs, transfers, toileting, and locomotion level  PARTICIPATION LIMITATIONS: shopping, community activity, and household activities  PERSONAL FACTORS: Age, Past/current experiences, Time since onset of injury/illness/exacerbation, and 3+ comorbidities: PMHx includes CHF, anxiety, depression, CAD, pulmonary effusion, HTN, MI, CABG x 4 (2018)  are also affecting patient's functional outcome.   REHAB POTENTIAL: Fair    CLINICAL DECISION MAKING: Evolving/moderate complexity  EVALUATION COMPLEXITY: Moderate   GOALS: Goals reviewed with patient? Yes  SHORT TERM GOALS: Target date: 12/16/2022  Patient will be independent with initial home program for LE strengthening in order to improve balance and performance of transfers.  Baseline: provided at eval  Goal status: INITIAL  2.  Patient demonstrating improved tandem stance on floor by at least 10 seconds to improve navigation of narrow spaces.  Baseline: see objective findings above Goal status: INITIAL   LONG TERM GOALS: Target date: 01/13/2023  Patient will report improved overall functional ability with FOTO score of 60 or greater.  Baseline: 53 Goal status: INITIAL  2.  Patient will demonstrate ability to maintain tandem stance and static standing with eyes closed for at least 20 seconds to indicate improved overall balance.  Goal status: INITIAL  3.  Patient will demonstrate ability to  perform tandem walking for at least 20 feet, with LRAD and without LOB.  Goal status: INITIAL  4.  Patient will verbalize understanding of strategies for decreased fall risk including use of LRAD, activity pacing for energy conservation, and use of light when walking through home at night.  Goal status: INITIAL  5. Patient will be independent with home program for ongoing LE strengthening and decreased risk of fall.  Goal status: INITIAL    PLAN:  PT FREQUENCY: 1-2x/week  PT DURATION: 8 weeks  PLANNED INTERVENTIONS: Therapeutic exercises, Therapeutic activity, Neuromuscular re-education, Balance training, Gait training, Patient/Family  education, Self Care, Joint mobilization, Stair training, Manual therapy, and Re-evaluation  PLAN FOR NEXT SESSION: continue with LE strengthening, static standing balance, assessment of gait/dynamic balance performance when able.   Referring diagnosis? Falls frequently [R29.6]  Treatment diagnosis? (if different than referring diagnosis):  Muscle weakness (generalized) M62.81,  Unsteadiness on feet R26.81  What was this (referring dx) caused by? []  Surgery [x]  Fall [x]  Ongoing issue []  Arthritis []  Other: ____________  Laterality: []  Rt []  Lt [x]  Both  Check all possible CPT codes:  *CHOOSE 10 OR LESS*    [x]  97110 (Therapeutic Exercise)  []  92507 (SLP Treatment)  [x]  97112 (Neuro Re-ed)   []  92526 (Swallowing Treatment)   [x]  97116 (Gait Training)   []  [id number] (Cognitive Training, 1st 15 minutes) [x]  97140 (Manual Therapy)   []  97130 (Cognitive Training, each add'l 15 minutes)  [x]  97164 (Re-evaluation)                              []  Other, List CPT Code ____________  [x]  97530 (Therapeutic Activities)     [x]  97535 (Self Care)   [x]  All codes above (97110 - 97535)  []  97012 (Mechanical Traction)  []  97014 (E-stim Unattended)  []  97032 (E-stim manual)  []  97033 (Ionto)  []  97035 (Ultrasound) []  97750 (Physical Performance  Training) []  [id number] (Aquatic Therapy) []  02725 (Vasopneumatic Device) []  [id number] (Paraffin) []  97034 (Contrast Bath) []  97597 (Wound Care 1st 20 sq cm) []  97598 (Wound Care each add'l 20 sq cm) []  97760 (Orthotic Fabrication, Fitting, Training Initial) []  [id number] (Prosthetic Management and Training Initial) []  [id number] (Orthotic or Prosthetic Training/ Modification Subsequent)    Mauri Reading, PT, DPT 11/16/2022, 6:57 PM

## 2022-11-15 NOTE — Progress Notes (Signed)
Remote ICD transmission.   

## 2022-11-20 ENCOUNTER — Ambulatory Visit: Payer: Medicare PPO | Admitting: Physical Therapy

## 2022-11-20 ENCOUNTER — Encounter: Payer: Medicare PPO | Admitting: Occupational Therapy

## 2022-11-21 ENCOUNTER — Ambulatory Visit: Payer: Medicare PPO

## 2022-11-22 ENCOUNTER — Other Ambulatory Visit (HOSPITAL_COMMUNITY): Payer: Self-pay | Admitting: Internal Medicine

## 2022-11-22 DIAGNOSIS — I2510 Atherosclerotic heart disease of native coronary artery without angina pectoris: Secondary | ICD-10-CM

## 2022-11-22 DIAGNOSIS — I214 Non-ST elevation (NSTEMI) myocardial infarction: Secondary | ICD-10-CM

## 2022-11-22 DIAGNOSIS — E782 Mixed hyperlipidemia: Secondary | ICD-10-CM

## 2022-11-23 ENCOUNTER — Encounter (HOSPITAL_COMMUNITY): Payer: Self-pay | Admitting: Internal Medicine

## 2022-11-23 ENCOUNTER — Telehealth (HOSPITAL_COMMUNITY): Payer: Self-pay | Admitting: *Deleted

## 2022-11-24 ENCOUNTER — Telehealth (HOSPITAL_COMMUNITY): Payer: Self-pay | Admitting: Vascular Surgery

## 2022-11-24 DIAGNOSIS — I5022 Chronic systolic (congestive) heart failure: Secondary | ICD-10-CM

## 2022-11-24 NOTE — Telephone Encounter (Signed)
Lvm giving head CT ,asked pt to call back to get labs 2 weeks before

## 2022-11-24 NOTE — Telephone Encounter (Signed)
Radiology labs placed for upcoming procedure Bmet (bun/cre)

## 2022-11-27 ENCOUNTER — Encounter: Payer: Medicare PPO | Admitting: Occupational Therapy

## 2022-11-27 ENCOUNTER — Ambulatory Visit: Payer: Medicare PPO | Admitting: Physical Therapy

## 2022-11-30 ENCOUNTER — Ambulatory Visit: Payer: Medicare PPO

## 2022-11-30 ENCOUNTER — Ambulatory Visit (HOSPITAL_COMMUNITY)
Admission: RE | Admit: 2022-11-30 | Discharge: 2022-11-30 | Disposition: A | Discharge status: Home / Self Care | Payer: Medicare PPO | Source: Ambulatory Visit | Attending: Cardiology | Admitting: Cardiology

## 2022-11-30 DIAGNOSIS — I5022 Chronic systolic (congestive) heart failure: Secondary | ICD-10-CM | POA: Insufficient documentation

## 2022-11-30 DIAGNOSIS — M6281 Muscle weakness (generalized): Secondary | ICD-10-CM

## 2022-11-30 DIAGNOSIS — R2681 Unsteadiness on feet: Secondary | ICD-10-CM

## 2022-11-30 LAB — BASIC METABOLIC PANEL
Anion gap: 9 (ref 5–15)
BUN: 12 mg/dL (ref 8–23)
CO2: 24 mmol/L (ref 22–32)
Calcium: 8.8 mg/dL — ABNORMAL LOW (ref 8.9–10.3)
Chloride: 105 mmol/L (ref 98–111)
Creatinine, Ser: 0.86 mg/dL (ref 0.44–1.00)
GFR, Estimated: >60 mL/min (ref >60)
Glucose, Bld: 120 mg/dL — ABNORMAL HIGH (ref 70–99)
Potassium: 4.6 mmol/L (ref 3.5–5.1)
Sodium: 138 mmol/L (ref 135–145)

## 2022-11-30 NOTE — Therapy (Signed)
OUTPATIENT PHYSICAL THERAPY LOWER EXTREMITY EVALUATION   Patient Name: Latoya Adams MRN: [id number] DOB:11-30-1945, 77 y.o., female Today's Date: 11/15/2022  END OF SESSION:  PT End of Session - 11/30/22 1137     Visit Number 2    Number of Visits 16    Date for PT Re-Evaluation 01/13/23    PT Start Time 1137    PT Stop Time 1215    PT Time Calculation (min) 38 min    Activity Tolerance Patient tolerated treatment well    Behavior During Therapy [facility] for tasks assessed/performed              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   Alcohol abuse    Anxiety    Arthritis    CHF (congestive heart failure) (HCC)    Coronary artery disease    Depression    Dyspnea    due to Pulmonary Effusion   History of blood transfusion 2018   during CABG   Hypertension    Myocardial infarction (HCC) 12/2016   Past Surgical History:  Procedure Laterality Date   BAROREFLEX SYSTEM INSERTION N/A 06/15/2021   Procedure: BAROREFLEX SYSTEM INSERTION;  Surgeon: Hillis Range, MD;  Location: [facility] INVASIVE CV LAB;  Service: Cardiovascular;  Laterality: N/A;   CHEST TUBE INSERTION Right 03/06/2017   Procedure: INSERTION PLEURAL DRAINAGE CATHETER;  Surgeon: Kerin Perna, MD;  Location: [facility] OR;  Service: Thoracic;  Laterality: Right;   COLONOSCOPY     CORONARY ARTERY BYPASS GRAFT N/A 12/08/2016   Procedure: CORONARY ARTERY BYPASS GRAFTING (CABG)x4 using left internal mammary artery and bilateral greater saphenous veins harvested endoscopically;  Surgeon: Kerin Perna, MD;  Location: [facility] OR;  Service: Open Heart Surgery;  Laterality: N/A;   dental implants     ICD IMPLANT  09/20/2017   ICD IMPLANT N/A 09/20/2017   Procedure: ICD IMPLANT;  Surgeon: Regan Lemming, MD;  Location: [facility] INVASIVE CV LAB;  Service: Cardiovascular;  Laterality: N/A;   IR THORACENTESIS ASP PLEURAL SPACE W/IMG GUIDE  01/24/2017   LEFT HEART CATH AND  CORONARY ANGIOGRAPHY N/A 12/03/2016   Procedure: Left Heart Cath and Coronary Angiography;  Surgeon: Tonny Bollman, MD;  Location: [facility] INVASIVE CV LAB;  Service: Cardiovascular;  Laterality: N/A;   REMOVAL OF PLEURAL DRAINAGE CATHETER Right 06/12/2017   Procedure: REMOVAL OF PLEURAL DRAINAGE CATHETER;  Surgeon: Kerin Perna, MD;  Location: [facility] OR;  Service: Thoracic;  Laterality: Right;   RIGHT HEART CATH N/A 12/05/2016   Procedure: Right Heart Cath;  Surgeon: Dolores Patty, MD;  Location: [facility] INVASIVE CV LAB;  Service: Cardiovascular;  Laterality: N/A;   TEE WITHOUT CARDIOVERSION N/A 12/08/2016   Procedure: TRANSESOPHAGEAL ECHOCARDIOGRAM (TEE);  Surgeon: Donata Clay, Theron Arista, MD;  Location: [facility] OR;  Service: Open Heart Surgery;  Laterality: N/A;   TOTAL HIP ARTHROPLASTY Left 03/04/2019   Procedure: LEFT TOTAL HIP ARTHROPLASTY ANTERIOR APPROACH;  Surgeon: Kathryne Hitch, MD;  Location: [facility] OR;  Service: Orthopedics;  Laterality: Left;   TUBAL LIGATION     Patient Active Problem List   Diagnosis Date Noted   History of recent fall 10/11/2022   CHF (congestive heart failure), NYHA class III, chronic, combined (HCC) 06/15/2021   Preop testing 06/07/2021   Hyperlipidemia 02/25/2020   Status post total replacement of left hip 03/04/2019   Unilateral primary osteoarthritis, left hip 01/01/2019   Ischemic cardiomyopathy 09/20/2017   Chronic systolic CHF (congestive heart failure) (HCC)  01/08/2017   Tobacco use 01/08/2017   S/P CABG x 4 12/08/2016   Centrilobular emphysema (HCC) 12/06/2016   CAD in native artery 12/06/2016   NSTEMI (non-ST elevated myocardial infarction) (HCC) 12/03/2016   Hypertension, essential 12/03/2016    PCP: Pcp, No  REFERRING PROVIDER: Chronic systolic CHF (congestive heart failure) (HCC) [I50.22], Falls frequently [R29.6]   REFERRING DIAG: Bensimhon, Bevelyn Buckles, MD   THERAPY DIAG:  Muscle weakness (generalized)  Unsteadiness on feet  Rationale for  Evaluation and Treatment: Rehabilitation  ONSET DATE: Many years  SUBJECTIVE:   SUBJECTIVE STATEMENT: Patient denies any recent falls. She has been compliant with her home exercises.    PERTINENT HISTORY: PMHx includes CHF, AICD present, anxiety, depression, CAD, dyspnea d/t pulmonary effusion, HTN, MI, CABG x 4 (2018), neurotransmitter implanted  Patient confirms that her SBP is typically 101-115.   PAIN:  Pain description: N/T in L LE that is sharp proximally, aching distally  PRECAUTIONS: Fall  WEIGHT BEARING RESTRICTIONS: No  FALLS:  Has patient fallen in last 6 months? Yes. Number of falls several, approx 1x/month  LIVING ENVIRONMENT: Lives with: lives with their family Lives in: House/apartment Stairs: Yes: Internal: 2 steps; on right going up and External: 2 steps; bilateral Has following equipment at home: None  OCCUPATION: N/A  PLOF: Independent  PATIENT GOALS: Patient would like to be able to function without fear of losing her balance.   NEXT MD VISIT: 11/16/22 cardiology   OBJECTIVE:    PATIENT SURVEYS:  FOTO 53 current, 60 predicted   COGNITION: Overall cognitive status: Within functional limits for tasks assessed      LOWER EXTREMITY MMT:   Grossly 4-/5 bilaterally    FUNCTIONAL TESTS:  FGA 14/30 on 11/30/22  Most difficulty with tandem walking, backwards walking, walking with eyes closed, walking with head turns (vertical and horizontal) See flowsheet for additional details     GAIT: Distance walked: 100 ft Assistive device utilized: None Level of assistance: SBA Comments: pt demonstrating small step length/decreased stance time with decreased knee flexion in swing phase and minimal pelvic tilt throughout gait phases; improvements noted with use of SPC   BALANCE:   ON GROUND:   Romberg EO: 30"  Romberg EC: 30"  L Tandem: 5"  R Tandem: 5"   ON FOAM:   Romberg EO: 30"  Romberg EC: 5"   L Tandem: 2"  R Tandem: 2"   [facility] Adult  PT Treatment:                                                DATE: 11/30/2022  Neuromuscular Reeducation: // bars  Mini-Squat, 2 x 10  Tandem stance, 30sec x 2 bilaterally  Heel-toe raises x 30 each  Head turns/nods, 2 x 10 each with feet together  Standing 3-way hip x 10 each  Lateral Hurdle Step Over (yoga block stacked with 1/2 foam) Fwd/back 1/2 foam step over  Therapeutic Activity:  Functional gait Assessment as noted below    [facility] Adult PT Treatment:                                                DATE: 11/15/2022  Therapeutic Exercise: Created, provided and reviewed home exercise  program as noted below   PATIENT EDUCATION:  Education details: regarding components of balance, noted deficits, prognosis and POC; discussed recommendation for use of SPC for safety and decreased risk of falls.  Person educated: Patient Education method: Explanation, Demonstration, and Handouts Education comprehension: verbalized understanding, returned demonstration, and needs further education  HOME EXERCISE PROGRAM: Access Code: [email or web address] URL: [email or web address] Date: 11/16/2022 Prepared by: Mauri Reading  Exercises - Heel Toe Raises with Counter Support  - 1 x daily - 7 x weekly - 2-3 sets - 10 reps - Standing Hip Abduction with Counter Support  - 1 x daily - 7 x weekly - 2-3 sets - 10 reps - Standing Hip Extension with Counter Support  - 1 x daily - 7 x weekly - 2-3 sets - 10 reps - Mini Squat with Counter Support  - 1 x daily - 7 x weekly - 2-3 sets - 10 reps - Seated Ankle Dorsiflexion with Resistance  - 1 x daily - 7 x weekly - 2-3 sets - 10 reps - Seated Ankle Eversion with Resistance  - 1 x daily - 7 x weekly - 2-3 sets - 10 reps  ASSESSMENT:  CLINICAL IMPRESSION: Shawny did well with her initial treatment session today. She has difficulty with static standing balance, but was able to perform safely and demonstrated decreased swaying with semi-tandem stance today.  We will continue to progress balance and LE strengthening program.    OBJECTIVE IMPAIRMENTS: Abnormal gait, decreased activity tolerance, decreased balance, decreased knowledge of use of DME, difficulty walking, decreased strength, decreased safety awareness, impaired perceived functional ability, and improper body mechanics.   ACTIVITY LIMITATIONS: carrying, bending, standing, squatting, stairs, transfers, toileting, and locomotion level  PARTICIPATION LIMITATIONS: shopping, community activity, and household activities  PERSONAL FACTORS: Age, Past/current experiences, Time since onset of injury/illness/exacerbation, and 3+ comorbidities: PMHx includes CHF, anxiety, depression, CAD, pulmonary effusion, HTN, MI, CABG x 4 (2018)  are also affecting patient's functional outcome.   REHAB POTENTIAL: Fair    CLINICAL DECISION MAKING: Evolving/moderate complexity  EVALUATION COMPLEXITY: Moderate   GOALS: Goals reviewed with patient? Yes  SHORT TERM GOALS: Target date: 12/16/2022  Patient will be independent with initial home program for LE strengthening in order to improve balance and performance of transfers.  Baseline: provided at eval  Goal status: INITIAL  2.  Patient demonstrating improved tandem stance on floor by at least 10 seconds to improve navigation of narrow spaces.  Baseline: see objective findings above Goal status: INITIAL   LONG TERM GOALS: Target date: 01/13/2023  Patient will report improved overall functional ability with FOTO score of 60 or greater.  Baseline: 53 Goal status: INITIAL  2.  Patient will demonstrate ability to maintain tandem stance and static standing with eyes closed for at least 20 seconds to indicate improved overall balance.  Goal status: INITIAL  3.  Patient will demonstrate ability to perform tandem walking for at least 20 feet, with LRAD and without LOB.  Goal status: INITIAL  4.  Patient will verbalize understanding of strategies for  decreased fall risk including use of LRAD, activity pacing for energy conservation, and use of light when walking through home at night.  Goal status: INITIAL  5. Patient will be independent with home program for ongoing LE strengthening and decreased risk of fall.  Goal status: INITIAL    PLAN:  PT FREQUENCY: 1-2x/week  PT DURATION: 8 weeks  PLANNED INTERVENTIONS: Therapeutic exercises, Therapeutic activity, Neuromuscular re-education, Balance training, Gait training, Patient/Family education,  Self Care, Joint mobilization, Stair training, Manual therapy, and Re-evaluation  PLAN FOR NEXT SESSION: continue with LE strengthening, static standing balance, assessment of gait/dynamic balance performance when able, floor transfers    Referring diagnosis? Falls frequently [R29.6]  Treatment diagnosis? (if different than referring diagnosis):  Muscle weakness (generalized) M62.81,  Unsteadiness on feet R26.81  What was this (referring dx) caused by? []  Surgery [x]  Fall [x]  Ongoing issue []  Arthritis []  Other: ____________  Laterality: []  Rt []  Lt [x]  Both  Check all possible CPT codes:  *CHOOSE 10 OR LESS*    [x]  97110 (Therapeutic Exercise)  []  92507 (SLP Treatment)  [x]  97112 (Neuro Re-ed)   []  92526 (Swallowing Treatment)   [x]  97116 (Gait Training)   []  [id number] (Cognitive Training, 1st 15 minutes) [x]  97140 (Manual Therapy)   []  97130 (Cognitive Training, each add'l 15 minutes)  [x]  97164 (Re-evaluation)                              []  Other, List CPT Code ____________  [x]  97530 (Therapeutic Activities)     [x]  97535 (Self Care)   [x]  All codes above (97110 - 97535)  []  40981 (Mechanical Traction)  []  97014 (E-stim Unattended)  []  97032 (E-stim manual)  []  97033 (Ionto)  []  97035 (Ultrasound) []  97750 (Physical Performance Training) []  [id number] (Aquatic Therapy) []  97016 (Vasopneumatic Device) []  [id number] (Paraffin) []  97034 (Contrast Bath) []  97597 (Wound Care 1st 20 sq  cm) []  97598 (Wound Care each add'l 20 sq cm) []  97760 (Orthotic Fabrication, Fitting, Training Initial) []  [id number] (Prosthetic Management and Training Initial) []  [id number] (Orthotic or Prosthetic Training/ Modification Subsequent)    Mauri Reading, PT, DPT 11/30/2022, 4:42 PM

## 2022-12-05 ENCOUNTER — Ambulatory Visit: Payer: Medicare PPO | Attending: Internal Medicine

## 2022-12-05 DIAGNOSIS — R2681 Unsteadiness on feet: Secondary | ICD-10-CM | POA: Insufficient documentation

## 2022-12-05 DIAGNOSIS — M6281 Muscle weakness (generalized): Secondary | ICD-10-CM | POA: Insufficient documentation

## 2022-12-05 NOTE — Therapy (Addendum)
 OUTPATIENT PHYSICAL THERAPY LOWER EXTREMITY Treatment/ Discharge    Visits from Start of Care: 3  Current functional level related to goals / functional outcomes: See assessment   Remaining deficits: Current status unknown    Education / Equipment: HEP   Patient agrees to discharge. Patient goals were not met. Patient is being discharged due to not returning since the last visit.  Joneen Fresh PT, DPT, LAT, ATC  04/02/24  8:17 AM         Patient Name: Latoya Adams MRN: [id number] DOB:03/05/1946, 77 y.o., female Today's Date: 12/05/2022   END OF SESSION:  PT End of Session - 12/05/22 1221     Visit Number 3    Number of Visits 16    Date for PT Re-Evaluation 01/13/23    PT Start Time 1220    PT Stop Time 1258    PT Time Calculation (min) 38 min    Activity Tolerance Patient tolerated treatment well    Behavior During Therapy [facility] for tasks assessed/performed              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   Alcohol abuse    Anxiety    Arthritis    CHF (congestive heart failure) (HCC)    Coronary artery disease    Depression    Dyspnea    due to Pulmonary Effusion   History of blood transfusion 2018   during CABG   Hypertension    Myocardial infarction (HCC) 12/2016   Past Surgical History:  Procedure Laterality Date   BAROREFLEX SYSTEM INSERTION N/A 06/15/2021   Procedure: BAROREFLEX SYSTEM INSERTION;  Surgeon: Kelsie Agent, MD;  [facility]: [facility] INVASIVE CV LAB;  Service: Cardiovascular;  Laterality: N/A;   CHEST TUBE INSERTION Right 03/06/2017   Procedure: INSERTION PLEURAL DRAINAGE CATHETER;  Surgeon: Fleeta Hanford Coy, MD;  Location: [facility] OR;  Service: Thoracic;  Laterality: Right;   COLONOSCOPY     CORONARY ARTERY BYPASS GRAFT N/A 12/08/2016   Procedure: CORONARY ARTERY BYPASS GRAFTING (CABG)x4 using left internal mammary artery and bilateral greater saphenous veins harvested  endoscopically;  Surgeon: Fleeta Hanford Coy, MD;  Location: [facility] OR;  Service: Open Heart Surgery;  Laterality: N/A;   dental implants     ICD IMPLANT  09/20/2017   ICD IMPLANT N/A 09/20/2017   Procedure: ICD IMPLANT;  Surgeon: Inocencio Soyla Lunger, MD;  Location: [facility] INVASIVE CV LAB;  Service: Cardiovascular;  Laterality: N/A;   IR THORACENTESIS ASP PLEURAL SPACE W/IMG GUIDE  01/24/2017   LEFT HEART CATH AND CORONARY ANGIOGRAPHY N/A 12/03/2016   Procedure: Left Heart Cath and Coronary Angiography;  Surgeon: Wonda Sharper, MD;  Location: [facility] INVASIVE CV LAB;  Service: Cardiovascular;  Laterality: N/A;   REMOVAL OF PLEURAL DRAINAGE CATHETER Right 06/12/2017   Procedure: REMOVAL OF PLEURAL DRAINAGE CATHETER;  Surgeon: Fleeta Hanford Coy, MD;  Location: [facility] OR;  Service: Thoracic;  Laterality: Right;   RIGHT HEART CATH N/A 12/05/2016   Procedure: Right Heart Cath;  Surgeon: Cherrie Toribio SAUNDERS, MD;  Location: [facility] INVASIVE CV LAB;  Service: Cardiovascular;  Laterality: N/A;   TEE WITHOUT CARDIOVERSION N/A 12/08/2016   Procedure: TRANSESOPHAGEAL ECHOCARDIOGRAM (TEE);  Surgeon: Fleeta Hanford, Coy, MD;  Location: [facility] OR;  Service: Open Heart Surgery;  Laterality: N/A;   TOTAL HIP ARTHROPLASTY Left 03/04/2019   Procedure: LEFT TOTAL HIP ARTHROPLASTY ANTERIOR APPROACH;  Surgeon: Vernetta Lonni GRADE, MD;  Location: [facility] OR;  Service: Orthopedics;  Laterality: Left;  TUBAL LIGATION     Patient Active Problem List   Diagnosis Date Noted   History of recent fall 10/11/2022   CHF (congestive heart failure), NYHA class III, chronic, combined (HCC) 06/15/2021   Preop testing 06/07/2021   Hyperlipidemia 02/25/2020   Status post total replacement of left hip 03/04/2019   Unilateral primary osteoarthritis, left hip 01/01/2019   Ischemic cardiomyopathy 09/20/2017   Chronic systolic CHF (congestive heart failure) (HCC) 01/08/2017   Tobacco use 01/08/2017   S/P CABG x 4 12/08/2016   Centrilobular emphysema (HCC) 12/06/2016    CAD in native artery 12/06/2016   NSTEMI (non-ST elevated myocardial infarction) (HCC) 12/03/2016   Hypertension, essential 12/03/2016    PCP: Pcp, No  REFERRING PROVIDER: Chronic systolic CHF (congestive heart failure) (HCC) [I50.22], Falls frequently [R29.6]   REFERRING DIAG: Bensimhon, Toribio SAUNDERS, MD   THERAPY DIAG:  Muscle weakness (generalized)  Unsteadiness on feet  Rationale for Evaluation and Treatment: Rehabilitation  ONSET DATE: Many years  SUBJECTIVE:   SUBJECTIVE STATEMENT: Patient denies any recent falls. She has been compliant with her home exercises. I have good days and bad days, today is a pretty good day.    PERTINENT HISTORY: PMHx includes CHF, AICD present, anxiety, depression, CAD, dyspnea d/t pulmonary effusion, HTN, MI, CABG x 4 (2018), neurotransmitter implanted  Patient confirms that her SBP is typically 101-115.   PAIN:  Pain description: N/T in L LE that is sharp proximally, aching distally  PRECAUTIONS: Fall  WEIGHT BEARING RESTRICTIONS: No  FALLS:  Has patient fallen in last 6 months? Yes. Number of falls several, approx 1x/month  LIVING ENVIRONMENT: Lives with: lives with their family Lives in: House/apartment Stairs: Yes: Internal: 2 steps; on right going up and External: 2 steps; bilateral Has following equipment at home: None  OCCUPATION: N/A  PLOF: Independent  PATIENT GOALS: Patient would like to be able to function without fear of losing her balance.   NEXT MD VISIT: 11/16/22 cardiology   OBJECTIVE:    PATIENT SURVEYS:  FOTO 53 current, 60 predicted   COGNITION: Overall cognitive status: Within functional limits for tasks assessed      LOWER EXTREMITY MMT:   Grossly 4-/5 bilaterally    FUNCTIONAL TESTS:  FGA 14/30 on 11/30/22  Most difficulty with tandem walking, backwards walking, walking with eyes closed, walking with head turns (vertical and horizontal) See flowsheet for additional details      GAIT: Distance walked: 100 ft Assistive device utilized: None Level of assistance: SBA Comments: pt demonstrating small step length/decreased stance time with decreased knee flexion in swing phase and minimal pelvic tilt throughout gait phases; improvements noted with use of SPC   BALANCE:   ON GROUND:   Romberg EO: 30  Romberg EC: 30  L Tandem: 5  R Tandem: 5   ON FOAM:   Romberg EO: 30  Romberg EC: 5   L Tandem: 2  R Tandem: 2   [facility] Adult PT Treatment:                                                DATE: 12/05/2022  Therapeutic Exercise: 4-way ankle strengthening with red TB x 15 each direction BIL  Updated HEP to include ankle strengthening  Neuromuscular re-ed: // bars  Fwd marches x 2 laps Lateral stepping (cueing for increased SLS time) x 3 laps  Mini-Squat, 2 x 10  Heel-toe raises x 20 each  Obstacle Course x 4  Lateral Hurdle Step Over (yoga block stacked with 1/2 foam) Step on/off airex, 2 x 30 sec     [facility] Adult PT Treatment:                                                DATE: 11/30/2022  Neuromuscular Reeducation: // bars  Mini-Squat, 2 x 10  Tandem stance, 30sec x 2 bilaterally  Heel-toe raises x 30 each  Head turns/nods, 2 x 10 each with feet together  Standing 3-way hip x 10 each  Lateral Hurdle Step Over (yoga block stacked with 1/2 foam) Fwd/back 1/2 foam step over  Therapeutic Activity:  Functional gait assessment as noted below    [facility] Adult PT Treatment:                                                DATE: 11/15/2022  Therapeutic Exercise: Created, provided and reviewed home exercise program as noted below   PATIENT EDUCATION:  Education details: regarding components of balance, noted deficits, prognosis and POC; discussed recommendation for use of SPC for safety and decreased risk of falls.  Person educated: Patient Education method: Explanation, Demonstration, and Handouts Education comprehension: verbalized  understanding, returned demonstration, and needs further education  HOME EXERCISE PROGRAM: Access Code: [email or web address] URL: [email or web address] Date: 12/05/2022 Prepared by: Marko Molt  Exercises - Heel Toe Raises with Counter Support  - 1 x daily - 7 x weekly - 2 sets - 10 reps - Standing Hip Abduction with Counter Support  - 1 x daily - 7 x weekly - 2 sets - 10 reps - Standing Hip Extension with Counter Support  - 1 x daily - 7 x weekly - 2 sets - 10 reps - Mini Squat with Counter Support  - 1 x daily - 7 x weekly - 2-3 sets - 10 reps - Seated Ankle Dorsiflexion with Resistance  - 1 x daily - 7 x weekly - 2 sets - 10 reps - Seated Ankle Eversion with Resistance  - 1 x daily - 7 x weekly - 2 sets - 10 reps - Seated Ankle Plantarflexion with Resistance  - 1 x daily - 7 x weekly - 2 sets - 10 reps - Seated Ankle Inversion with Anchored Resistance  - 1 x daily - 7 x weekly - 2 sets - 10 reps  ASSESSMENT:  CLINICAL IMPRESSION: Latoya Adams is demonstrating difficulty with SLS time throughout parallel bar routine. She has diminished ankle strategy and was able to begin ankle strengthening activities. Updated home program and reviewed with patient. We will continue to progress balance and LE strengthening program.    OBJECTIVE IMPAIRMENTS: Abnormal gait, decreased activity tolerance, decreased balance, decreased knowledge of use of DME, difficulty walking, decreased strength, decreased safety awareness, impaired perceived functional ability, and improper body mechanics.   ACTIVITY LIMITATIONS: carrying, bending, standing, squatting, stairs, transfers, toileting, and locomotion level  PARTICIPATION LIMITATIONS: shopping, community activity, and household activities  PERSONAL FACTORS: Age, Past/current experiences, Time since onset of injury/illness/exacerbation, and 3+ comorbidities: PMHx includes CHF, anxiety, depression, CAD, pulmonary effusion, HTN, MI, CABG x 4 (2018) are also  affecting patient's functional  outcome.   REHAB POTENTIAL: Fair    CLINICAL DECISION MAKING: Evolving/moderate complexity  EVALUATION COMPLEXITY: Moderate   GOALS: Goals reviewed with patient? Yes  SHORT TERM GOALS: Target date: 12/16/2022  Patient will be independent with initial home program for LE strengthening in order to improve balance and performance of transfers.  Baseline: provided at eval  Goal status: INITIAL  2.  Patient demonstrating improved tandem stance on floor by at least 10 seconds to improve navigation of narrow spaces.  Baseline: see objective findings above Goal status: INITIAL   LONG TERM GOALS: Target date: 01/13/2023  Patient will report improved overall functional ability with FOTO score of 60 or greater.  Baseline: 53 Goal status: INITIAL  2.  Patient will demonstrate ability to maintain tandem stance and static standing with eyes closed for at least 20 seconds to indicate improved overall balance.  Goal status: INITIAL  3.  Patient will demonstrate ability to perform tandem walking for at least 20 feet, with LRAD and without LOB.  Goal status: INITIAL  4.  Patient will verbalize understanding of strategies for decreased fall risk including use of LRAD, activity pacing for energy conservation, and use of light when walking through home at night.  Goal status: INITIAL  5. Patient will be independent with home program for ongoing LE strengthening and decreased risk of fall.  Goal status: INITIAL    PLAN:  PT FREQUENCY: 1-2x/week  PT DURATION: 8 weeks  PLANNED INTERVENTIONS: Therapeutic exercises, Therapeutic activity, Neuromuscular re-education, Balance training, Gait training, Patient/Family education, Self Care, Joint mobilization, Stair training, Manual therapy, and Re-evaluation  PLAN FOR NEXT SESSION: continue with LE strengthening, static standing balance, assessment of gait/dynamic balance performance when able, floor transfers     Referring diagnosis? Falls frequently [R29.6]  Treatment diagnosis? (if different than referring diagnosis):  Muscle weakness (generalized) M62.81,  Unsteadiness on feet R26.81  What was this (referring dx) caused by? []  Surgery [x]  Fall [x]  Ongoing issue []  Arthritis []  Other: ____________  Laterality: []  Rt []  Lt [x]  Both  Check all possible CPT codes:  *CHOOSE 10 OR LESS*    [x]  97110 (Therapeutic Exercise)  []  92507 (SLP Treatment)  [x]  97112 (Neuro Re-ed)   []  92526 (Swallowing Treatment)   [x]  02883 (Gait Training)   []  [id number] (Cognitive Training, 1st 15 minutes) [x]  97140 (Manual Therapy)   []  97130 (Cognitive Training, each add'l 15 minutes)  [x]  97164 (Re-evaluation)                              []  Other, List CPT Code ____________  [x]  97530 (Therapeutic Activities)     [x]  97535 (Self Care)   [x]  All codes above (97110 - 97535)  []  97012 (Mechanical Traction)  []  02985 (E-stim Unattended)  []  97032 (E-stim manual)  []  97033 (Ionto)  []  97035 (Ultrasound) []  97750 (Physical Performance Training) []  [id number] (Aquatic Therapy) []  97016 (Vasopneumatic Device) []  [id number] (Paraffin) []  97034 (Contrast Bath) []  97597 (Wound Care 1st 20 sq cm) []  97598 (Wound Care each add'l 20 sq cm) []  97760 (Orthotic Fabrication, Fitting, Training Initial) []  [id number] (Prosthetic Management and Training Initial) []  [id number] (Orthotic or Prosthetic Training/ Modification Subsequent)    Marko Molt, PT, DPT 12/05/2022, 6:54 PM

## 2022-12-11 ENCOUNTER — Other Ambulatory Visit (HOSPITAL_COMMUNITY): Payer: Self-pay | Admitting: *Deleted

## 2022-12-11 ENCOUNTER — Ambulatory Visit (HOSPITAL_COMMUNITY)
Admission: RE | Admit: 2022-12-11 | Discharge: 2022-12-11 | Disposition: A | Discharge status: Home / Self Care | Payer: Medicare PPO | Source: Ambulatory Visit | Attending: Internal Medicine | Admitting: Internal Medicine

## 2022-12-11 DIAGNOSIS — Z9181 History of falling: Secondary | ICD-10-CM

## 2022-12-11 DIAGNOSIS — R296 Repeated falls: Secondary | ICD-10-CM | POA: Diagnosis present

## 2022-12-12 ENCOUNTER — Ambulatory Visit: Payer: Medicare PPO

## 2022-12-19 ENCOUNTER — Ambulatory Visit: Payer: Medicare PPO

## 2022-12-26 ENCOUNTER — Ambulatory Visit: Payer: Medicare PPO

## 2023-01-02 ENCOUNTER — Ambulatory Visit: Payer: Medicare PPO

## 2023-01-09 ENCOUNTER — Ambulatory Visit: Payer: Medicare PPO

## 2023-01-24 ENCOUNTER — Ambulatory Visit (INDEPENDENT_AMBULATORY_CARE_PROVIDER_SITE_OTHER): Payer: Medicare PPO

## 2023-01-24 DIAGNOSIS — I255 Ischemic cardiomyopathy: Secondary | ICD-10-CM

## 2023-01-24 LAB — CUP PACEART REMOTE DEVICE CHECK
Battery Remaining Longevity: 64 mo
Battery Voltage: 2.99 V
Brady Statistic RV Percent Paced: 0.01 %
Date Time Interrogation Session: 20240821103727
HighPow Impedance: 75 Ohm
Implantable Lead Connection Status: 753985
Implantable Lead Implant Date: 20190418
Implantable Lead Location: 753860
Implantable Pulse Generator Implant Date: 20190418
Lead Channel Impedance Value: 361 Ohm
Lead Channel Impedance Value: 418 Ohm
Lead Channel Pacing Threshold Amplitude: 0.75 V
Lead Channel Pacing Threshold Pulse Width: 0.4 ms
Lead Channel Sensing Intrinsic Amplitude: 20.75 mV
Lead Channel Sensing Intrinsic Amplitude: 20.75 mV
Lead Channel Setting Pacing Amplitude: 2.5 V
Lead Channel Setting Pacing Pulse Width: 0.4 ms
Lead Channel Setting Sensing Sensitivity: 0.3 mV
Zone Setting Status: 755011
Zone Setting Status: 755011

## 2023-02-06 NOTE — Progress Notes (Signed)
Remote ICD transmission.   

## 2023-04-06 ENCOUNTER — Other Ambulatory Visit (HOSPITAL_COMMUNITY): Payer: Self-pay

## 2023-04-06 MED ORDER — ENTRESTO 24-26 MG PO TABS: 1.0000 | ORAL_TABLET | Freq: Two times a day (BID) | ORAL | 3 refills | Status: DC

## 2023-04-06 NOTE — Telephone Encounter (Signed)
Meds ordered this encounter  Medications   sacubitril-valsartan (ENTRESTO) 24-26 MG    Sig: Take 1 tablet by mouth 2 (two) times daily.    Dispense:  180 tablet    Refill:  3

## 2023-04-18 ENCOUNTER — Other Ambulatory Visit (HOSPITAL_COMMUNITY): Payer: Self-pay | Admitting: Internal Medicine

## 2023-04-18 DIAGNOSIS — E782 Mixed hyperlipidemia: Secondary | ICD-10-CM

## 2023-04-18 DIAGNOSIS — I2510 Atherosclerotic heart disease of native coronary artery without angina pectoris: Secondary | ICD-10-CM

## 2023-04-18 DIAGNOSIS — I214 Non-ST elevation (NSTEMI) myocardial infarction: Secondary | ICD-10-CM

## 2023-04-25 ENCOUNTER — Ambulatory Visit (INDEPENDENT_AMBULATORY_CARE_PROVIDER_SITE_OTHER): Payer: Medicare PPO

## 2023-04-25 DIAGNOSIS — I5022 Chronic systolic (congestive) heart failure: Secondary | ICD-10-CM

## 2023-04-25 DIAGNOSIS — I255 Ischemic cardiomyopathy: Secondary | ICD-10-CM

## 2023-04-27 LAB — CUP PACEART REMOTE DEVICE CHECK
Battery Remaining Longevity: 64 mo
Battery Voltage: 2.97 V
Brady Statistic RV Percent Paced: 0.01 %
Date Time Interrogation Session: 20241121193732
HighPow Impedance: 80 Ohm
Implantable Lead Connection Status: 753985
Implantable Lead Implant Date: 20190418
Implantable Lead Location: 753860
Implantable Pulse Generator Implant Date: 20190418
Lead Channel Impedance Value: 418 Ohm
Lead Channel Impedance Value: 456 Ohm
Lead Channel Pacing Threshold Amplitude: 0.75 V
Lead Channel Pacing Threshold Pulse Width: 0.4 ms
Lead Channel Sensing Intrinsic Amplitude: 21.875 mV
Lead Channel Sensing Intrinsic Amplitude: 21.875 mV
Lead Channel Setting Pacing Amplitude: 2.5 V
Lead Channel Setting Pacing Pulse Width: 0.4 ms
Lead Channel Setting Sensing Sensitivity: 0.3 mV
Zone Setting Status: 755011
Zone Setting Status: 755011

## 2023-05-21 NOTE — Progress Notes (Signed)
Remote ICD transmission.   

## 2023-05-21 NOTE — Addendum Note (Signed)
Addended by: Geralyn Flash D on: 05/21/2023 05:07 PM   Modules accepted: Orders

## 2023-06-04 ENCOUNTER — Encounter (HOSPITAL_COMMUNITY): Payer: Medicare PPO

## 2023-06-21 ENCOUNTER — Encounter (HOSPITAL_COMMUNITY): Payer: Medicare PPO

## 2023-06-21 ENCOUNTER — Ambulatory Visit (HOSPITAL_COMMUNITY)
Admission: RE | Admit: 2023-06-21 | Discharge: 2023-06-21 | Disposition: A | Discharge status: Home / Self Care | Payer: Medicare PPO | Source: Ambulatory Visit | Attending: Cardiology | Admitting: Cardiology

## 2023-06-21 ENCOUNTER — Encounter (HOSPITAL_COMMUNITY): Payer: Self-pay | Admitting: Cardiology

## 2023-06-21 VITALS — BP 120/70 | HR 67 | Wt 154.6 lb

## 2023-06-21 DIAGNOSIS — I2510 Atherosclerotic heart disease of native coronary artery without angina pectoris: Secondary | ICD-10-CM | POA: Diagnosis not present

## 2023-06-21 DIAGNOSIS — I5022 Chronic systolic (congestive) heart failure: Secondary | ICD-10-CM

## 2023-06-21 DIAGNOSIS — Z008 Encounter for other general examination: Secondary | ICD-10-CM | POA: Diagnosis present

## 2023-06-21 DIAGNOSIS — I739 Peripheral vascular disease, unspecified: Secondary | ICD-10-CM | POA: Diagnosis not present

## 2023-06-21 DIAGNOSIS — J432 Centrilobular emphysema: Secondary | ICD-10-CM

## 2023-06-21 DIAGNOSIS — I255 Ischemic cardiomyopathy: Secondary | ICD-10-CM | POA: Diagnosis not present

## 2023-06-21 MED ORDER — FUROSEMIDE 20 MG PO TABS: 20.0000 mg | ORAL_TABLET | Freq: Every day | ORAL | 1 refills | Status: DC | PRN

## 2023-06-21 NOTE — Patient Instructions (Addendum)
Good to see you today!   Medication Changes:  START LASIX 20MG  DAILY AS NEEDED    Follow-Up in: 6 MONTH WITH DR. [name]. GIVE OUR OFFICE A CALL AROUND MAY TO SCHEDULE JUNE APPOINTMENT.   At the [facility], you and your health needs are our priority. We have a designated team specialized in the treatment of Heart Failure. This Care Team includes your primary Heart Failure Specialized Cardiologist (physician), Advanced Practice Providers (APPs- Physician Assistants and Nurse Practitioners), and Pharmacist who all work together to provide you with the care you need, when you need it.   You may see any of the following providers on your designated Care Team at your next follow up:  Dr. Arvilla Meres Dr. Marca Ancona Dr. Dorthula Nettles Dr. Theresia Bough Tonye Becket, NP Robbie Lis, [place] [facility] [place], [place] Brynda Peon, NP [place] Lee, NP Karle Plumber, PharmD   Please be sure to bring in all your medications bottles to every appointment.   Need to Contact us:  If you have any questions or concerns before your next appointment please send [place] a message through [place] or call our office at [phone].    TO LEAVE A MESSAGE FOR THE NURSE SELECT OPTION 2, PLEASE LEAVE A MESSAGE INCLUDING: YOUR NAME DATE OF BIRTH CALL BACK NUMBER REASON FOR CALL**this is important as we prioritize the call backs  YOU WILL RECEIVE A CALL BACK THE SAME DAY AS LONG AS YOU CALL BEFORE 4:00 PM

## 2023-06-22 NOTE — Progress Notes (Signed)
[facility] Note   ID:  Shakinah, Becvar [birth year], MRN [id number]  Location: Home  Provider location: [address], [place] Type of Visit: Established patient  PCP:  Pcp, No  Primary HF: Dr [name]  Chief Complaint: chronic systolic heart failure, CAD   HPI:  Latoya Adams is a 78 y.o.female with history of CAD s/ CABG x 4 12/08/2016, chronic systolic CHF (EF 78-29% 09/2017) cMRI EF 24% with only small apex scar, s/p MDT ICD, tobacco use, COPD, hx of R pleural effusion with previous pleurex catheter, and chronic anemia.   Follow up 11/22, remained very fatigued. Entresto decreased to 24/26 bid. Echo 12/22 showed EF 30-35%, LV moderately decreased with global HK, grade I DD, RV low normal.  S/p Barostim 1/23.   Patient returns for follow up. She reports that overall she is doing well, her symptoms have remained about the same over the last year though her dizziness is improved. She does household chores, but does not exercise outside of this. She reports to steadily gaining weight over the past few years and has noticed some occasional lower extremity swelling. She otherwise has no complaints.    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   Alcohol abuse    Anxiety    Arthritis    CHF (congestive heart failure) (HCC)    Coronary artery disease    Depression    Dyspnea    due to Pulmonary Effusion   History of blood transfusion 2018   during CABG   Hypertension    Myocardial infarction (HCC) 12/2016   Past Surgical History:  Procedure Laterality Date   BAROREFLEX SYSTEM INSERTION N/A 06/15/2021   Procedure: BAROREFLEX SYSTEM INSERTION;  Surgeon: Hillis Range, MD;  Location: [facility] INVASIVE CV LAB;  Service: Cardiovascular;  Laterality: N/A;   CHEST TUBE INSERTION Right 03/06/2017   Procedure: INSERTION PLEURAL DRAINAGE CATHETER;  Surgeon: Kerin Perna, MD;  Location: [facility] OR;   Service: Thoracic;  Laterality: Right;   COLONOSCOPY     CORONARY ARTERY BYPASS GRAFT N/A 12/08/2016   Procedure: CORONARY ARTERY BYPASS GRAFTING (CABG)x4 using left internal mammary artery and bilateral greater saphenous veins harvested endoscopically;  Surgeon: Kerin Perna, MD;  Location: [facility] OR;  Service: Open Heart Surgery;  Laterality: N/A;   dental implants     ICD IMPLANT  09/20/2017   ICD IMPLANT N/A 09/20/2017   Procedure: ICD IMPLANT;  Surgeon: Regan Lemming, MD;  Location: [facility] INVASIVE CV LAB;  Service: Cardiovascular;  Laterality: N/A;   IR THORACENTESIS ASP PLEURAL SPACE W/IMG GUIDE  01/24/2017   LEFT HEART CATH AND CORONARY ANGIOGRAPHY N/A 12/03/2016   Procedure: Left Heart Cath and Coronary Angiography;  Surgeon: Tonny Bollman, MD;  Location[facility] INVASIVE CV LAB;  Service: Cardiovascular;  Laterality: N/A;   REMOVAL OF PLEURAL DRAINAGE CATHETER Right 06/12/2017   Procedure: REMOVAL OF PLEURAL DRAINAGE CATHETER;  Surgeon: Kerin Perna, MD;  Location: [facility] OR;  Service: Thoracic;  Laterality: Right;   RIGHT HEART CATH N/A 12/05/2016   Procedure: Right Heart Cath;  Surgeon: Dolores Patty, MD;  Location: [facility] INVASIVE CV LAB;  Service: Cardiovascular;  Laterality: N/A;   TEE WITHOUT CARDIOVERSION N/A 12/08/2016   Procedure: TRANSESOPHAGEAL ECHOCARDIOGRAM (TEE);  Surgeon: Donata Clay, Theron Arista, MD;  Location: [facility] OR;  Service: Open Heart Surgery;  Laterality: N/A;   TOTAL HIP ARTHROPLASTY Left 03/04/2019   Procedure: LEFT TOTAL  HIP ARTHROPLASTY ANTERIOR APPROACH;  Surgeon: Kathryne Hitch, MD;  Location: [facility] OR;  Service: Orthopedics;  Laterality: Left;   TUBAL LIGATION     Current Outpatient Medications  Medication Sig Dispense Refill   aspirin 81 MG chewable tablet Chew 81 mg by mouth daily.     carvedilol (COREG) 12.5 MG tablet Take 12.5 mg by mouth 2 (two) times daily with a meal.     Cholecalciferol (VITAMIN D-3 PO) Take 1 tablet by mouth daily.     Evolocumab (REPATHA  SURECLICK) 140 MG/ML SOAJ INJECT THE CONTENTS OF 1 PEN UNDER THE SKIN EVERY 14 DAYS 6 mL 1   FARXIGA 10 MG TABS tablet TAKE 1 TABLET BY MOUTH DAILY BEFORE BREAKFAST. 90 tablet 3   furosemide (LASIX) 20 MG tablet Take 1 tablet (20 mg total) by mouth daily as needed. 30 tablet 1   Multiple Vitamins-Minerals (CENTRUM WOMEN PO) Take 1 tablet by mouth daily.     sacubitril-valsartan (ENTRESTO) 24-26 MG Take 1 tablet by mouth 2 (two) times daily. 180 tablet 3   sertraline (ZOLOFT) 100 MG tablet Take 100 mg by mouth 2 (two) times daily.     spironolactone (ALDACTONE) 25 MG tablet TAKE 1 TABLET EVERY DAY 90 tablet 1   No current facility-administered medications for this encounter.    Allergies:   Codeine, Atorvastatin, Rosuvastatin, and Varenicline tartrate   Social History:  The patient  reports that she quit smoking about 6 years ago. Her smoking use included cigarettes. She started smoking about 56 years ago. She has never used smokeless tobacco. She reports that she does not drink alcohol and does not use drugs.   Family History:  [name] family history includes CAD in her sister; Heart block in her father, mother, and sister.    BP 120/70   Pulse 67   Wt 70.1 kg (154 lb 9.6 oz)   SpO2 96%   BMI 24.21 kg/m   Wt Readings from Last 3 Encounters:  06/21/23 70.1 kg (154 lb 9.6 oz)  10/11/22 67.1 kg (148 lb)  06/29/22 68.1 kg (150 lb 3.2 oz)   Recent Labs: 10/11/2022: ALT 13; Hemoglobin 12.8; Platelets 293; TSH 2.234 11/30/2022: BUN 12; Creatinine, Ser 0.86; Potassium 4.6; Sodium 138  Personally reviewed   Physical Exam:   General:  Well appearing. No resp difficulty HEENT: normal Neck: supple. no JVD. Carotids 2+ bilat; no bruits. No lymphadenopathy or thryomegaly appreciated. Cor: PMI nondisplaced. Regular rate & rhythm. No rubs, gallops or murmurs. Lungs: decreased throughout Abdomen: soft, nontender, nondistended. No hepatosplenomegaly. No bruits or masses. Good bowel  sounds. Extremities: mild lower extremity edema to the ankles Neuro: alert & orientedx3, cranial nerves grossly intact. moves all 4 extremities w/o difficulty. Affect pleasant.FTN ok. Tandem walk slight imbalance. Romberg ok  ECG: NSR 67 Personally reviewed   Device Interrogation (personally reviewed): OptiVol down, 1-2 hours day/activity, no AF or VT Personally reviewed  Assessment & Plan: 1. Chronic systolic HF due to ICM - Pre-op echo (7/18) EF 20-25% with normal RV - Pre-op cMRI (7/18) EF 24% with only small scar in apex. RHC with normal CO and no PAH - s/p CABG x 4. 12/08/16 - Echo 04/2017 EF 20-25%. Echo 09/2017 EF 20%, RV normal. S/p MDT ICD 09/2017. - Echo 09/2017 EF 25%, grade 1 DD, mild AI, mild MR - Echo 12/26/18 EF 30-35% Mild AI. RV ok  - Echo 02/25/20 EF 30-35% RV ok  - Echo (12/22): EF 30-35%, RV low normal -  Echo 7/23 EF 30% moderate MR RV mildly down  - Echo 1/24 EF 25-30% Mild MR Personally reviewed - s/p Barostim 1/23 - ICD. Interrogated personally. No VT/AF Optivol minimally elevated - Stable NYHA II-III Volume status mildly increased - Continue carvedilol 12.5mg  BID - Continue Farxiga 10 mg daily. - Continue Entresto 24/26 mg bid. - Continue spiro 25 mg daily.   - Restart lasix 20mg  prn for fluid/weight gain - Long discussion about increasing activity level and starting aerobic exercise, will work on for next time - Labs at next visit  2. CAD - S/P CABG x4 on 12/08/16 - No s/s angina - Continue ASA 81 mg daily.  - She has stopped her atorvastatin, she discussed this with Vascular. - Continue Repatha. Followed by [facility]   3. Recurrent R pleural effusion - S/p Pleurex removal 06/2017. - No recurrence.   4. PAD/claudication  - No claudication. - ABIs 09/2017: bilateral TBI abnormal.  - Aortoiliac 10/2017: significant left common and external iliac artery disease. [facility] consult recommended. She saw Dr Kirke Corin 11/06/17 and wanted to continue medical therapy for  now.   5. Tobacco use/COPD - PFTs show severe COPD.  - No longer smoking  6. Falls - no recent falls  Signed, Romie Minus, MD  06/22/2023 9:46 AM  [facility] [address] [facility] [place] [address] [phone] (office) [phone] (fax)

## 2023-06-27 NOTE — Progress Notes (Unsigned)
  Cardiology Office Note:   Date:  06/28/2023  ID:  Gage, Nagle [birth year], MRN [id number]  Primary Cardiologist: Arvilla Meres, MD Electrophysiologist: Regan Lemming, MD   History of Present Illness:   Latoya Adams is a 78 y.o. female with h/o CAD s/ CABG x 4 12/08/2016, chronic systolic CHF (EF 69-62% 09/2017) cMRI EF 24% with only small apex scar, s/p MDT ICD, tobacco use, COPD, hx of R pleural effusion with previous pleurex catheter, and chronic anemia  seen today for routine electrophysiology followup.   Since last being seen in our clinic the patient reports doing well. Overall, she denies chest pain, palpitations, dyspnea, PND, orthopnea, nausea, vomiting, dizziness, syncope, edema, weight gain, or early satiety.  She is not very active. Feels the barostim device in her neck when she turns her head. Not stim, but structurally.   Review of systems complete and found to be negative unless listed in HPI.    EP Information / Studies Reviewed:    EKG is ordered today. Personal review as below.  EKG Interpretation Date/Time:  Thursday June 28 2023 12:17:51 EST Ventricular Rate:  63 PR Interval:  142 QRS Duration:  90 QT Interval:  460 QTC Calculation: 470 R Axis:   71  Text Interpretation: Normal sinus rhythm ST & T wave abnormality, consider inferior ischemia Prolonged QT When compared with ECG of 03-May-2022 11:30, No significant change was found Confirmed by Maxine Glenn [phone]) on 06/28/2023 12:22:12 PM    Arrhythmia/Device History Medtronic Single Chamber ICD implanted 09/2017 for CHF  Batwire (BAT) 06/2021  CHF- Dr. [name] - Vest reading is difficult Echo 1/24 EF 25-30% Mild MR Personally reviewed   Barostim Interrogation- Performed personally and reviewed in detail today,  See scanned report  ICD/PPM interrogation - Performed personally and reviewed in detail today.    Physical Exam:   VS:  BP 118/66   Ht 5\' 7"  (1.702 m)   Wt 154 lb 3.2 oz  (69.9 kg)   BMI 24.15 kg/m    Wt Readings from Last 3 Encounters:  06/28/23 154 lb 3.2 oz (69.9 kg)  06/21/23 154 lb 9.6 oz (70.1 kg)  10/11/22 148 lb (67.1 kg)     GEN: Well nourished, well developed in no acute distress NECK: No JVD; No carotid bruits CARDIAC: Regular rate and rhythm, no murmurs, rubs, gallops RESPIRATORY:  Clear to auscultation without rales, wheezing or rhonchi  ABDOMEN: Soft, non-tender, non-distended EXTREMITIES:  No edema; No deformity   ASSESSMENT AND PLAN:    Chronic systolic CHF s/p Medtronic and Barostim implantation NYHA II-III symptoms. Not very active  Device chronic settings  Device impedence stable. Normal device function See scanned report.  CAD s/p CABG Denies s/s ischemia   Disposition:   Follow up with EP APP in in 6 months for barostim check.   Signed, Graciella Freer, PA-C

## 2023-06-28 ENCOUNTER — Ambulatory Visit: Payer: Medicare PPO | Attending: Student | Admitting: Student

## 2023-06-28 ENCOUNTER — Encounter: Payer: Self-pay | Admitting: Student

## 2023-06-28 VITALS — BP 118/66 | HR 63 | Ht 67.0 in | Wt 154.2 lb

## 2023-06-28 DIAGNOSIS — I5022 Chronic systolic (congestive) heart failure: Secondary | ICD-10-CM | POA: Diagnosis not present

## 2023-06-28 DIAGNOSIS — I2510 Atherosclerotic heart disease of native coronary artery without angina pectoris: Secondary | ICD-10-CM | POA: Diagnosis not present

## 2023-06-28 DIAGNOSIS — I255 Ischemic cardiomyopathy: Secondary | ICD-10-CM

## 2023-06-28 LAB — CUP PACEART INCLINIC DEVICE CHECK
Battery Remaining Longevity: 58 mo
Battery Voltage: 2.97 V
Brady Statistic RV Percent Paced: 0 %
Date Time Interrogation Session: 20250123123506
HighPow Impedance: 80 Ohm
Implantable Lead Connection Status: 753985
Implantable Lead Implant Date: 20190418
Implantable Lead Location: 753860
Implantable Pulse Generator Implant Date: 20190418
Lead Channel Impedance Value: 418 Ohm
Lead Channel Impedance Value: 475 Ohm
Lead Channel Pacing Threshold Amplitude: 0.875 V
Lead Channel Pacing Threshold Pulse Width: 0.4 ms
Lead Channel Sensing Intrinsic Amplitude: 20.75 mV
Lead Channel Sensing Intrinsic Amplitude: 25 mV
Lead Channel Setting Pacing Amplitude: 2.5 V
Lead Channel Setting Pacing Pulse Width: 0.4 ms
Lead Channel Setting Sensing Sensitivity: 0.3 mV
Zone Setting Status: 755011
Zone Setting Status: 755011

## 2023-06-28 NOTE — Patient Instructions (Signed)
 Medication Instructions:  Your physician recommends that you continue on your current medications as directed. Please refer to the Current Medication list given to you today.  *If you need a refill on your cardiac medications before your next appointment, please call your pharmacy*  Lab Work: BMET, CBC-TODAY If you have labs (blood work) drawn today and your tests are completely normal, you will receive your results only by: MyChart Message (if you have MyChart) OR A paper copy in the mail If you have any lab test that is abnormal or we need to change your treatment, we will call you to review the results.  Follow-Up: At [facility], you and your health needs are our priority.  As part of our continuing mission to provide you with exceptional heart care, we have created designated Provider Care Teams.  These Care Teams include your primary Cardiologist (physician) and Advanced Practice Providers (APPs -  Physician Assistants and Nurse Practitioners) who all work together to provide you with the care you need, when you need it.  Your next appointment:   6 month(s)  Provider:   Casimiro Needle "Otilio Saber, PA-C

## 2023-06-29 LAB — BASIC METABOLIC PANEL
BUN/Creatinine Ratio: 16 (ref 12–28)
BUN: 12 mg/dL (ref 8–27)
CO2: 24 mmol/L (ref 20–29)
Calcium: 8.8 mg/dL (ref 8.7–10.3)
Chloride: 105 mmol/L (ref 96–106)
Creatinine, Ser: 0.75 mg/dL (ref 0.57–1.00)
Glucose: 93 mg/dL (ref 70–99)
Potassium: 3.9 mmol/L (ref 3.5–5.2)
Sodium: 143 mmol/L (ref 134–144)
eGFR: 83 mL/min/{1.73_m2} (ref >59)

## 2023-06-29 LAB — CBC
Hematocrit: 40.2 % (ref 34.0–46.6)
Hemoglobin: 13.5 g/dL (ref 11.1–15.9)
MCH: 30.5 pg (ref 26.6–33.0)
MCHC: 33.6 g/dL (ref 31.5–35.7)
MCV: 91 fL (ref 79–97)
Platelets: 310 10*3/uL (ref 150–450)
RBC: 4.42 x10E6/uL (ref 3.77–5.28)
RDW: 12.6 % (ref 11.7–15.4)
WBC: 5.4 10*3/uL (ref 3.4–10.8)

## 2023-07-10 ENCOUNTER — Encounter (HOSPITAL_COMMUNITY): Payer: Medicare PPO

## 2023-07-10 ENCOUNTER — Encounter (HOSPITAL_COMMUNITY): Payer: Self-pay

## 2023-07-13 ENCOUNTER — Other Ambulatory Visit (HOSPITAL_COMMUNITY): Payer: Self-pay | Admitting: Cardiology

## 2023-07-25 ENCOUNTER — Ambulatory Visit (INDEPENDENT_AMBULATORY_CARE_PROVIDER_SITE_OTHER): Payer: Medicare PPO

## 2023-07-25 DIAGNOSIS — I255 Ischemic cardiomyopathy: Secondary | ICD-10-CM

## 2023-07-26 ENCOUNTER — Telehealth (HOSPITAL_COMMUNITY): Payer: Self-pay

## 2023-07-26 ENCOUNTER — Other Ambulatory Visit (HOSPITAL_COMMUNITY): Payer: Self-pay

## 2023-07-26 LAB — CUP PACEART REMOTE DEVICE CHECK
Battery Remaining Longevity: 52 mo
Battery Voltage: 2.98 V
Brady Statistic RV Percent Paced: 0.01 %
Date Time Interrogation Session: 20250219123629
HighPow Impedance: 72 Ohm
Implantable Lead Connection Status: 753985
Implantable Lead Implant Date: 20190418
Implantable Lead Location: 753860
Implantable Pulse Generator Implant Date: 20190418
Lead Channel Impedance Value: 361 Ohm
Lead Channel Impedance Value: 456 Ohm
Lead Channel Pacing Threshold Amplitude: 0.625 V
Lead Channel Pacing Threshold Pulse Width: 0.4 ms
Lead Channel Sensing Intrinsic Amplitude: 21.125 mV
Lead Channel Sensing Intrinsic Amplitude: 21.125 mV
Lead Channel Setting Pacing Amplitude: 2.5 V
Lead Channel Setting Pacing Pulse Width: 0.4 ms
Lead Channel Setting Sensing Sensitivity: 0.3 mV
Zone Setting Status: 755011
Zone Setting Status: 755011

## 2023-07-26 NOTE — Telephone Encounter (Signed)
Advanced Heart Failure Patient Advocate Encounter  The patient was renewed for a [employer] grant that will help cover the cost of Carvedilol, Entresto, Farxiga, Spironolactone.  Total amount awarded, $10,000.  Effective: 07/20/2023 - 07/18/2024.  BIN [id number] PCN [id number] Group [id number] ID [id number]  Pharmacy provided with approval and processing information. Patient informed via phone.  Burnell Blanks, CPhT Rx Patient Advocate Phone: [phone]

## 2023-08-29 NOTE — Addendum Note (Signed)
 Addended by: Elease Etienne A on: 08/29/2023 11:16 AM   Modules accepted: Orders

## 2023-08-29 NOTE — Progress Notes (Signed)
 Remote ICD transmission.

## 2023-09-14 ENCOUNTER — Other Ambulatory Visit (HOSPITAL_COMMUNITY): Payer: Self-pay

## 2023-09-14 MED ORDER — SPIRONOLACTONE 25 MG PO TABS: 25.0000 mg | ORAL_TABLET | Freq: Every day | ORAL | 3 refills | Status: AC

## 2023-10-12 ENCOUNTER — Other Ambulatory Visit (HOSPITAL_COMMUNITY): Payer: Self-pay | Admitting: Internal Medicine

## 2023-10-12 DIAGNOSIS — E782 Mixed hyperlipidemia: Secondary | ICD-10-CM

## 2023-10-12 DIAGNOSIS — I214 Non-ST elevation (NSTEMI) myocardial infarction: Secondary | ICD-10-CM

## 2023-10-12 DIAGNOSIS — I2510 Atherosclerotic heart disease of native coronary artery without angina pectoris: Secondary | ICD-10-CM

## 2023-10-24 ENCOUNTER — Ambulatory Visit (INDEPENDENT_AMBULATORY_CARE_PROVIDER_SITE_OTHER): Payer: Medicare PPO

## 2023-10-24 DIAGNOSIS — I255 Ischemic cardiomyopathy: Secondary | ICD-10-CM

## 2023-10-24 LAB — CUP PACEART REMOTE DEVICE CHECK
Battery Remaining Longevity: 55 mo
Battery Voltage: 2.99 V
Brady Statistic RV Percent Paced: 0 %
Date Time Interrogation Session: 20250521033325
HighPow Impedance: 77 Ohm
Implantable Lead Connection Status: 753985
Implantable Lead Implant Date: 20190418
Implantable Lead Location: 753860
Implantable Pulse Generator Implant Date: 20190418
Lead Channel Impedance Value: 399 Ohm
Lead Channel Impedance Value: 456 Ohm
Lead Channel Pacing Threshold Amplitude: 0.875 V
Lead Channel Pacing Threshold Pulse Width: 0.4 ms
Lead Channel Sensing Intrinsic Amplitude: 26.125 mV
Lead Channel Sensing Intrinsic Amplitude: 26.125 mV
Lead Channel Setting Pacing Amplitude: 2.5 V
Lead Channel Setting Pacing Pulse Width: 0.4 ms
Lead Channel Setting Sensing Sensitivity: 0.3 mV
Zone Setting Status: 755011
Zone Setting Status: 755011

## 2023-10-25 ENCOUNTER — Ambulatory Visit: Payer: Self-pay | Admitting: Cardiology

## 2023-12-13 NOTE — Progress Notes (Signed)
 Remote ICD transmission.

## 2023-12-14 ENCOUNTER — Other Ambulatory Visit (HOSPITAL_COMMUNITY): Payer: Self-pay | Admitting: Cardiology

## 2023-12-14 MED ORDER — CARVEDILOL 12.5 MG PO TABS: 12.5000 mg | ORAL_TABLET | Freq: Two times a day (BID) | ORAL | 11 refills | Status: AC

## 2023-12-17 ENCOUNTER — Other Ambulatory Visit (INDEPENDENT_AMBULATORY_CARE_PROVIDER_SITE_OTHER)

## 2023-12-17 ENCOUNTER — Encounter: Payer: Self-pay | Admitting: Physician Assistant

## 2023-12-17 ENCOUNTER — Ambulatory Visit (INDEPENDENT_AMBULATORY_CARE_PROVIDER_SITE_OTHER): Admitting: Physician Assistant

## 2023-12-17 DIAGNOSIS — M25512 Pain in left shoulder: Secondary | ICD-10-CM

## 2023-12-17 NOTE — Progress Notes (Signed)
 HPI: Mrs. Mcilvain comes in today for left shoulder pain.  She had a fall on 626 this was a mechanical fall.  No loss of consciousness dizziness.  She was seen at the [facility] ER and diagnosed with a greater tubercle fracture left shoulder.  She is having difficulty sleeping.  She has been taking Tylenol  for the pain which helps.  She was also given meloxicam which is giving her no relief.  She does occasionally take some oxycodone  which she was given from the ER.  Review of systems: See HPI otherwise negative  Physical exam: General Well-developed well-nourished female no acute distress mood and affect appropriate. Left upper extremity: Full range of motion left elbow wrist and hand.  No attempts of range of motion left shoulder today.  Tenderness over the left shoulder greater tubercle.  Biceps is nontender.  Radiographs:2 views left shoulder: Nondisplaced greater tubercle fracture.  No signs of consolidation.  Humeral head is well located.  No other acute findings.  Pacemaker is seen upper left lung field region.  Impression: Left shoulder pain  Plan: No extreme range of motion external and internal rotation or abduction.  Sling for comfort.  Follow-up in 4 weeks obtain AP Y view of the left shoulder at that time.  Continue to work on range of motion left hand,wrist and elbow.

## 2024-01-14 ENCOUNTER — Encounter: Payer: Self-pay | Admitting: Physician Assistant

## 2024-01-14 ENCOUNTER — Other Ambulatory Visit (INDEPENDENT_AMBULATORY_CARE_PROVIDER_SITE_OTHER): Payer: Self-pay

## 2024-01-14 ENCOUNTER — Ambulatory Visit: Admitting: Physician Assistant

## 2024-01-14 DIAGNOSIS — M25512 Pain in left shoulder: Secondary | ICD-10-CM

## 2024-01-14 NOTE — Progress Notes (Signed)
 HPI: Latoya Adams returns today 6 weeks and 4 days status post nondisplaced left shoulder greater tubercle fracture.  She states the arm is very achy.  She is having trouble sleeping due to the pain.  Pain radiates from the shoulder down to the mid humerus.  Describes it as a deep ache.  She is no longer wearing the sling.  Review of systems: See HPI otherwise negative  Physical exam: General Well-developed well-nourished female no acute distress.  Mood and affect appropriate Respirations: Nonlabored Left shoulder: Passive forward flexion 90 degrees any attempts higher than 90 splint with pain and guarding.  She has very limited internal/external rotation. Left elbow: Good range of motion full supination pronation forearm.  Radiographs: 3 views left shoulder: Shoulder is well located.  Interval healing of the greater tubercle fracture.  No acute findings.  Impression: Left shoulder greater tubercle fracture  Plan: Will send her to physical therapy for range of motion strengthening left shoulder.  Prescription was given for resolved therapy.  They will include modalities.  Also include home exercise program she will follow-up with Dr. Vernetta in 4 weeks.  3 views of the left shoulder at that time.

## 2024-01-15 ENCOUNTER — Other Ambulatory Visit (HOSPITAL_COMMUNITY): Payer: Self-pay | Admitting: Internal Medicine

## 2024-01-23 ENCOUNTER — Ambulatory Visit (INDEPENDENT_AMBULATORY_CARE_PROVIDER_SITE_OTHER): Payer: Medicare PPO

## 2024-01-23 DIAGNOSIS — I255 Ischemic cardiomyopathy: Secondary | ICD-10-CM | POA: Diagnosis not present

## 2024-01-24 ENCOUNTER — Ambulatory Visit: Payer: Self-pay | Admitting: Cardiology

## 2024-01-24 LAB — CUP PACEART REMOTE DEVICE CHECK
Battery Remaining Longevity: 49 mo
Battery Voltage: 2.97 V
Brady Statistic RV Percent Paced: 0.01 %
Date Time Interrogation Session: 20250820153326
HighPow Impedance: 93 Ohm
Implantable Lead Connection Status: 753985
Implantable Lead Implant Date: 20190418
Implantable Lead Location: 753860
Implantable Pulse Generator Implant Date: 20190418
Lead Channel Impedance Value: 418 Ohm
Lead Channel Impedance Value: 513 Ohm
Lead Channel Pacing Threshold Amplitude: 0.75 V
Lead Channel Pacing Threshold Pulse Width: 0.4 ms
Lead Channel Sensing Intrinsic Amplitude: 18 mV
Lead Channel Sensing Intrinsic Amplitude: 18 mV
Lead Channel Setting Pacing Amplitude: 2.5 V
Lead Channel Setting Pacing Pulse Width: 0.4 ms
Lead Channel Setting Sensing Sensitivity: 0.3 mV
Zone Setting Status: 755011
Zone Setting Status: 755011

## 2024-02-11 ENCOUNTER — Other Ambulatory Visit (INDEPENDENT_AMBULATORY_CARE_PROVIDER_SITE_OTHER): Payer: Self-pay

## 2024-02-11 ENCOUNTER — Ambulatory Visit: Admitting: Orthopaedic Surgery

## 2024-02-11 ENCOUNTER — Encounter: Payer: Self-pay | Admitting: Orthopaedic Surgery

## 2024-02-11 ENCOUNTER — Ambulatory Visit (HOSPITAL_COMMUNITY)
Admission: RE | Admit: 2024-02-11 | Discharge: 2024-02-11 | Disposition: A | Discharge status: Home / Self Care | Source: Ambulatory Visit | Attending: Internal Medicine | Admitting: Internal Medicine

## 2024-02-11 ENCOUNTER — Encounter (HOSPITAL_COMMUNITY): Payer: Self-pay | Admitting: Internal Medicine

## 2024-02-11 VITALS — BP 120/64 | HR 83 | Wt 149.2 lb

## 2024-02-11 DIAGNOSIS — D649 Anemia, unspecified: Secondary | ICD-10-CM | POA: Diagnosis not present

## 2024-02-11 DIAGNOSIS — I2510 Atherosclerotic heart disease of native coronary artery without angina pectoris: Secondary | ICD-10-CM | POA: Diagnosis not present

## 2024-02-11 DIAGNOSIS — Z87891 Personal history of nicotine dependence: Secondary | ICD-10-CM | POA: Insufficient documentation

## 2024-02-11 DIAGNOSIS — J449 Chronic obstructive pulmonary disease, unspecified: Secondary | ICD-10-CM | POA: Insufficient documentation

## 2024-02-11 DIAGNOSIS — I255 Ischemic cardiomyopathy: Secondary | ICD-10-CM | POA: Diagnosis not present

## 2024-02-11 DIAGNOSIS — Z7984 Long term (current) use of oral hypoglycemic drugs: Secondary | ICD-10-CM | POA: Diagnosis not present

## 2024-02-11 DIAGNOSIS — Z79899 Other long term (current) drug therapy: Secondary | ICD-10-CM | POA: Insufficient documentation

## 2024-02-11 DIAGNOSIS — Z951 Presence of aortocoronary bypass graft: Secondary | ICD-10-CM | POA: Diagnosis not present

## 2024-02-11 DIAGNOSIS — I5022 Chronic systolic (congestive) heart failure: Secondary | ICD-10-CM | POA: Diagnosis not present

## 2024-02-11 DIAGNOSIS — I739 Peripheral vascular disease, unspecified: Secondary | ICD-10-CM | POA: Diagnosis not present

## 2024-02-11 DIAGNOSIS — M25512 Pain in left shoulder: Secondary | ICD-10-CM

## 2024-02-11 DIAGNOSIS — Z95810 Presence of automatic (implantable) cardiac defibrillator: Secondary | ICD-10-CM | POA: Diagnosis not present

## 2024-02-11 DIAGNOSIS — G8929 Other chronic pain: Secondary | ICD-10-CM | POA: Diagnosis not present

## 2024-02-11 DIAGNOSIS — E782 Mixed hyperlipidemia: Secondary | ICD-10-CM | POA: Diagnosis not present

## 2024-02-11 LAB — COMPREHENSIVE METABOLIC PANEL WITH GFR
ALT: 20 U/L (ref 0–44)
AST: 21 U/L (ref 15–41)
Albumin: 3.9 g/dL (ref 3.5–5.0)
Alkaline Phosphatase: 89 U/L (ref 38–126)
Anion gap: 13 (ref 5–15)
BUN: 11 mg/dL (ref 8–23)
CO2: 26 mmol/L (ref 22–32)
Calcium: 9.4 mg/dL (ref 8.9–10.3)
Chloride: 95 mmol/L — ABNORMAL LOW (ref 98–111)
Creatinine, Ser: 0.81 mg/dL (ref 0.44–1.00)
GFR, Estimated: >60 mL/min (ref >60)
Glucose, Bld: 104 mg/dL — ABNORMAL HIGH (ref 70–99)
Potassium: 3.8 mmol/L (ref 3.5–5.1)
Sodium: 134 mmol/L — ABNORMAL LOW (ref 135–145)
Total Bilirubin: 0.7 mg/dL (ref 0.0–1.2)
Total Protein: 7.1 g/dL (ref 6.5–8.1)

## 2024-02-11 LAB — LIPID PANEL
Cholesterol: 140 mg/dL (ref 0–200)
HDL: 41 mg/dL (ref >40)
LDL Cholesterol: 61 mg/dL (ref 0–99)
Total CHOL/HDL Ratio: 3.4 ratio
Triglycerides: 189 mg/dL — ABNORMAL HIGH (ref <150)
VLDL: 38 mg/dL (ref 0–40)

## 2024-02-11 LAB — CBC
HCT: 40.7 % (ref 36.0–46.0)
Hemoglobin: 13.8 g/dL (ref 12.0–15.0)
MCH: 31.4 pg (ref 26.0–34.0)
MCHC: 33.9 g/dL (ref 30.0–36.0)
MCV: 92.7 fL (ref 80.0–100.0)
Platelets: 352 K/uL (ref 150–400)
RBC: 4.39 MIL/uL (ref 3.87–5.11)
RDW: 12.9 % (ref 11.5–15.5)
WBC: 6.8 K/uL (ref 4.0–10.5)
nRBC: 0 % (ref 0.0–0.2)

## 2024-02-11 LAB — BRAIN NATRIURETIC PEPTIDE: B Natriuretic Peptide: 392.4 pg/mL — ABNORMAL HIGH (ref 0.0–100.0)

## 2024-02-11 MED ORDER — TRAMADOL HCL 50 MG PO TABS: 50.0000 mg | ORAL_TABLET | Freq: Two times a day (BID) | ORAL | 0 refills | Status: AC | PRN

## 2024-02-11 NOTE — Progress Notes (Signed)
 Advanced Heart Failure Clinic Note   ID:  Jadis, Pitter Jul 18, 1945, MRN [id number]  Location: Home  Provider location: [address] [place] Type of Visit: Established patient  PCP:  Silver, Powell Chol, FNP  Primary HF: Dr Cherrie  Chief Complaint: chronic systolic heart failure, CAD   HPI:  Latoya Adams is a 78 y.o.female with history of CAD s/ CABG x 4 12/08/2016, chronic systolic CHF (EF 79-74% 09/2017) cMRI EF 24% with only small apex scar, s/p MDT ICD, tobacco use, COPD, hx of R pleural effusion with previous pleurex catheter, and chronic anemia.   Follow up 11/22, remained very fatigued. Entresto  decreased to 24/26 bid. Echo 12/22 showed EF 30-35%, LV moderately decreased with global HK, grade I DD, RV low normal.  S/p Barostim 1/23.   Here for routine f/u. Patient returns for follow up. At last visit lasix  20 daily started for mild fluid overloaded. Feels much better after lasix . Not very active. Denies CP or SOB. No edema. Compliant with meds. Watches grandkids.   Medtronic ICD interrogation: No VF/VT/AF. Volume ok. Activity level 1hr/day Personally reviewed    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   Alcohol abuse    Anxiety    Arthritis    CHF (congestive heart failure) (HCC)    Coronary artery disease    Depression    Dyspnea    due to Pulmonary Effusion   History of blood transfusion 2018   during CABG   Hypertension    Myocardial infarction (HCC) 12/2016   Past Surgical History:  Procedure Laterality Date   BAROREFLEX SYSTEM INSERTION N/A 06/15/2021   Procedure: BAROREFLEX SYSTEM INSERTION;  Surgeon: Kelsie Agent, MD;  Location: [facility] INVASIVE CV LAB;  Service: Cardiovascular;  Laterality: N/A;   CHEST TUBE INSERTION Right 03/06/2017   Procedure: INSERTION PLEURAL DRAINAGE CATHETER;  Surgeon: Fleeta Hanford Coy, MD;  Location: [facility] OR;  Service: Thoracic;  Laterality:  Right;   COLONOSCOPY     CORONARY ARTERY BYPASS GRAFT N/A 12/08/2016   Procedure: CORONARY ARTERY BYPASS GRAFTING (CABG)x4 using left internal mammary artery and bilateral greater saphenous veins harvested endoscopically;  Surgeon: Fleeta Hanford Coy, MD;  Location: [facility] OR;  Service: Open Heart Surgery;  Laterality: N/A;   dental implants     ICD IMPLANT  09/20/2017   ICD IMPLANT N/A 09/20/2017   Procedure: ICD IMPLANT;  Surgeon: Inocencio Soyla Lunger, MD;  Location: [facility] INVASIVE CV LAB;  Service: Cardiovascular;  Laterality: N/A;   IR THORACENTESIS ASP PLEURAL SPACE W/IMG GUIDE  01/24/2017   LEFT HEART CATH AND CORONARY ANGIOGRAPHY N/A 12/03/2016   Procedure: Left Heart Cath and Coronary Angiography;  Surgeon: Wonda Sharper, MD;  Location: [facility] INVASIVE CV LAB;  Service: Cardiovascular;  Laterality: N/A;   REMOVAL OF PLEURAL DRAINAGE CATHETER Right 06/12/2017   Procedure: REMOVAL OF PLEURAL DRAINAGE CATHETER;  Surgeon: Fleeta Hanford Coy, MD;  Location: [facility] OR;  Service: Thoracic;  Laterality: Right;   RIGHT HEART CATH N/A 12/05/2016   Procedure: Right Heart Cath;  Surgeon: Cherrie Toribio SAUNDERS, MD;  Location: [facility] INVASIVE CV LAB;  Service: Cardiovascular;  Laterality: N/A;   TEE WITHOUT CARDIOVERSION N/A 12/08/2016   Procedure: TRANSESOPHAGEAL ECHOCARDIOGRAM (TEE);  Surgeon: Fleeta Hanford, Coy, MD;  Location: [facility] OR;  Service: Open Heart Surgery;  Laterality: N/A;   TOTAL HIP ARTHROPLASTY Left 03/04/2019   Procedure: LEFT TOTAL HIP ARTHROPLASTY ANTERIOR APPROACH;  Surgeon: Vernetta Lonni GRADE,  MD;  Location: [facility] OR;  Service: Orthopedics;  Laterality: Left;   TUBAL LIGATION     Current Outpatient Medications  Medication Sig Dispense Refill   aspirin  81 MG chewable tablet Chew 81 mg by mouth daily.     carvedilol  (COREG ) 12.5 MG tablet Take 1 tablet (12.5 mg total) by mouth 2 (two) times daily with a meal. 60 tablet 11   Cholecalciferol (VITAMIN D-3 PO) Take 1 tablet by mouth daily.     Evolocumab  (REPATHA   SURECLICK) 140 MG/ML SOAJ INJECT THE CONTENTS OF 1 PEN UNDER THE SKIN EVERY 14 DAYS 6 mL 3   FARXIGA  10 MG TABS tablet TAKE 1 TABLET BY MOUTH DAILY BEFORE BREAKFAST. 90 tablet 3   furosemide  (LASIX ) 20 MG tablet TAKE 1 TABLET BY MOUTH EVERY DAY AS NEEDED 90 tablet 0   Multiple Vitamins-Minerals (CENTRUM WOMEN PO) Take 1 tablet by mouth daily.     sacubitril -valsartan  (ENTRESTO ) 24-26 MG Take 1 tablet by mouth 2 (two) times daily. 180 tablet 3   sertraline  (ZOLOFT ) 100 MG tablet Take 100 mg by mouth 2 (two) times daily.     spironolactone  (ALDACTONE ) 25 MG tablet Take 1 tablet (25 mg total) by mouth daily. 90 tablet 3   No current facility-administered medications for this encounter.    Allergies:   Codeine, Atorvastatin , Rosuvastatin , and Varenicline tartrate   Social History:  The patient  reports that she quit smoking about 7 years ago. Her smoking use included cigarettes. She started smoking about 57 years ago. She has never used smokeless tobacco. She reports that she does not drink alcohol and does not use drugs.   Family History:  [name] family history includes CAD in her sister; Heart block in her father, mother, and sister.    BP 120/64   Pulse 83   Wt 67.7 kg (149 lb 3.2 oz)   SpO2 95%   BMI 23.37 kg/m   Wt Readings from Last 3 Encounters:  02/11/24 67.7 kg (149 lb 3.2 oz)  06/28/23 69.9 kg (154 lb 3.2 oz)  06/21/23 70.1 kg (154 lb 9.6 oz)   Recent Labs: 06/28/2023: BUN 12; Creatinine, Ser 0.75; Hemoglobin 13.5; Platelets 310; Potassium 3.9; Sodium 143  Personally reviewed   Physical Exam:   General:  Elderly Well appearing. No resp difficulty HEENT: normal Neck: supple. no JVD. Carotids 2+ bilat; no bruits. No lymphadenopathy or thryomegaly appreciated. Cor: PMI nondisplaced. Regular rate & rhythm. No rubs, gallops or murmurs. Lungs: clear Abdomen: soft, nontender, nondistended.Good bowel sounds. Extremities: no cyanosis, clubbing, rash, edema Neuro: alert &  orientedx3, cranial nerves grossly intact. moves all 4 extremities w/o difficulty. Affect pleasant'  ECG: n/a   Device Interrogation (personally reviewed): OptiVol down, 1-2 hours day/activity, no AF or VT Personally reviewed  Assessment & Plan: 1. Chronic systolic HF due to ICM - Pre-op  echo (7/18) EF 20-25% with normal RV - Pre-op  cMRI (7/18) EF 24% with only small scar in apex. RHC with normal CO and no PAH - s/p CABG x 4. 12/08/16 - Echo 04/2017 EF 20-25%. Echo 09/2017 EF 20%, RV normal. S/p MDT ICD 09/2017. - Echo 09/2017 EF 25%, grade 1 DD, mild AI, mild MR - Echo 12/26/18 EF 30-35% Mild AI. RV ok  - Echo 02/25/20 EF 30-35% RV ok  - Echo (12/22): EF 30-35%, RV low normal - Echo 7/23 EF 30% moderate MR RV mildly down  - Echo 1/24 EF 25-30% Mild MR Personally reviewed - s/p Barostim 1/23 - ICD.  Interrogated personally.No VT/AF Fluid ok  - Improved NYHA II  Volume improved - Continue carvedilol  12.5mg  BID - Continue Farxiga  10 mg daily. - Continue Entresto  24/26 mg bid. BP has been too low to titrate.  - Continue spiro 25 mg daily.   - Continue lasix  20mg   - Discussed need to be more active - Check labs today  2. CAD - S/P CABG x4 on 12/08/16 - No s/s angina - Continue ASA 81 mg daily.  - She has stopped her atorvastatin , she discussed this with Vascular. - Continue Repatha . Followed by [facility]   3. Recurrent R pleural effusion - S/p Pleurex removal 06/2017. - No recurrence.   4. PAD/claudication  - No claudication. - ABIs 09/2017: bilateral TBI abnormal.  - Aortoiliac 10/2017: significant left common and external iliac artery disease. VVS consult recommended. She saw Dr Darron 11/06/17 and wanted to continue medical therapy for now.   5. Tobacco use/COPD - PFTs show severe COPD.  - No longer smoking   Signed, Toribio Fuel, MD  02/11/2024 3:01 PM  [facility] [address] [facility] V[facility] [place] [address] [phone]  (office) [phone] (fax)

## 2024-02-11 NOTE — Addendum Note (Signed)
 Encounter addended by: Buell Powell HERO, RN on: 02/11/2024 3:10 PM  Actions taken: Order list changed, Diagnosis association updated, Clinical Note Signed, Charge Capture section accepted

## 2024-02-11 NOTE — Addendum Note (Signed)
 Encounter addended by: Latonja Bobeck R, MD on: 02/11/2024 3:07 PM  Actions taken: Charge Capture section accepted

## 2024-02-11 NOTE — Progress Notes (Signed)
 The patient is now over 10 weeks status post a mechanical fall in which she injured her left shoulder and sustained a nondisplaced left greater tuberosity fracture.  This was successfully treated nonoperatively.  She is 78 years old.  She says she is doing well well except for at night where it becomes very painful.  On exam she does seem to have some weakness in the rotator cuff and limited range of motion.  She has been going to physical therapy as well.  X-rays of the left shoulder show the humeral head is well located in the below the fracture is continuing to heal and is maybe a almost completely healed at this point.  I would like to send her to my partner Dr. Addie for his assessment of her left shoulder rotator cuff under ultrasound and to mainly consider an intra-articular ultrasound-guided left shoulder injection.  She would like to try that as well.  I will send in some tramadol  for occasional pain.  She said she had tramadol  after her heart surgery and that seems to work well for her.  She knows to use this sparingly.  We will see about getting appointment with Dr. Addie.

## 2024-02-11 NOTE — Patient Instructions (Signed)
 Great to see you today!!!  No changes, continue current medications  Your physician recommends that you schedule a follow-up appointment in: 6 months (March 2026), **PLEASE CALL OUR OFFICE IN [place] TO SCHEDULE THIS APPOINTMENT  If you have any questions or concerns before your next appointment please send us  a message through [place] or call our office at [phone].    TO LEAVE A MESSAGE FOR THE NURSE SELECT OPTION 2, PLEASE LEAVE A MESSAGE INCLUDING: YOUR NAME DATE OF BIRTH CALL BACK NUMBER REASON FOR CALL**this is important as we prioritize the call backs  YOU WILL RECEIVE A CALL BACK THE SAME DAY AS LONG AS YOU CALL BEFORE 4:00 PM  At the [facility], you and your health needs are our priority. As part of our continuing mission to provide you with exceptional heart care, we have created designated Provider Care Teams. These Care Teams include your primary Cardiologist (physician) and Advanced Practice Providers (APPs- Physician Assistants and Nurse Practitioners) who all work together to provide you with the care you need, when you need it.   You may see any of the following providers on your designated Care Team at your next follow up: Dr Toribio Fuel Dr Ezra Shuck Dr. Ria Commander Dr. Morene Brownie Amy Lenetta, NP Caffie Shed, [place] [facility], [place] Beckey Coe, NP [place] Lee, NP Ellouise Class, NP Tinnie Redman, PharmD Jaun Bash, PharmD   Please be sure to bring in all your medications bottles to every appointment.    Thank you for choosing [facility]

## 2024-02-25 ENCOUNTER — Encounter: Payer: Self-pay | Admitting: Internal Medicine

## 2024-02-27 ENCOUNTER — Ambulatory Visit: Admitting: Orthopedic Surgery

## 2024-02-27 ENCOUNTER — Other Ambulatory Visit: Payer: Self-pay

## 2024-02-27 DIAGNOSIS — G8929 Other chronic pain: Secondary | ICD-10-CM | POA: Diagnosis not present

## 2024-02-27 DIAGNOSIS — M25512 Pain in left shoulder: Secondary | ICD-10-CM

## 2024-02-27 NOTE — Progress Notes (Signed)
Remote ICD Transmission.

## 2024-03-01 ENCOUNTER — Encounter: Payer: Self-pay | Admitting: Orthopedic Surgery

## 2024-03-01 NOTE — Progress Notes (Signed)
 Office Visit Note   Patient: Latoya Adams           Date of Birth: 01/23/1946           MRN: [id number] Visit Date: 02/27/2024 Requested by: Felicie Powell Chol, FNP [address] [facility] [place],  [place] [address] PCP: Felicie, Powell Chol, FNP  Subjective: Chief Complaint  Patient presents with   Left Shoulder - Pain    HPI: Latoya Adams is a 78 y.o. female who presents to the office reporting left shoulder pain.  The pain is getting worse since the end of June.  Patient fell and sustained a greater tuberosity fracture.  Unclear if this was an actual shoulder dislocation.  Hard for her to sleep.  She reports decreased range of motion.  She is right-hand dominant.  Takes tramadol  as needed.  She is recovering alcoholic and does not want to take narcotics.  She has been to therapy twice..                ROS: All systems reviewed are negative as they relate to the chief complaint within the history of present illness.  Patient denies fevers or chills.  Assessment & Plan: Visit Diagnoses:  1. Chronic left shoulder pain     Plan: Impression is left shoulder pain with significant stiffness.  She is very sensitive to any motion of the shoulder.  Radiographs show greater tuberosity fracture which looks like it is probably healed but is difficult to be 100% certain.  Plan at this time is ultrasound-guided injection into the glenohumeral joint to see if that can relieve some of the pain so she can get the shoulder moving.  Also need thin cut CT scan of the left shoulder to evaluate greater tuberosity fracture healing.  Follow-up after that study in about 3 weeks  Follow-Up Instructions: No follow-ups on file.   Orders:  Orders Placed This Encounter  Procedures   US  Guided Needle Placement - No Linked Charges   CT SHOULDER LEFT WO CONTRAST   No orders of the defined types were placed in this encounter.     Procedures: No procedures performed   Clinical  Data: No additional findings.  Objective: Vital Signs: There were no vitals taken for this visit.  Physical Exam:  Constitutional: Patient appears well-developed HEENT:  Head: Normocephalic Eyes:EOM are normal Neck: Normal range of motion Cardiovascular: Normal rate Pulmonary/chest: Effort normal Neurologic: Patient is alert Skin: Skin is warm Psychiatric: Patient has normal mood and affect  Ortho Exam: Ortho exam demonstrates about 20 degrees of passive external rotation and about 50 degrees of passive forward flexion and abduction before pain interferes with further examination.  Her external rotation and internal rotation strength do seem to be intact and comparable to the right-hand side.  Motor or sensory function of the hand is intact.  Deltoid is intact.  There is no shoulder instability.  Specialty Comments:  No specialty comments available.  Imaging: No results found.   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   Centrilobular emphysema (HCC) 12/06/2016   CAD in native artery 12/06/2016   NSTEMI (non-ST elevated myocardial infarction) (HCC) 12/03/2016   Hypertension, essential 12/03/2016   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   Alcohol abuse    Anxiety    Arthritis    CHF (congestive heart failure) (HCC)    Coronary artery disease    Depression    Dyspnea    due to Pulmonary Effusion   History of blood transfusion 2018   during  CABG   Hypertension    Myocardial infarction (HCC) 12/2016    Family History  Problem Relation Age of Onset   Heart block Mother    Heart block Father    CAD Sister    Heart block Sister    Heart attack Neg Hx     Past Surgical History:  Procedure Laterality Date   BAROREFLEX SYSTEM INSERTION N/A 06/15/2021   Procedure: BAROREFLEX SYSTEM INSERTION;  Surgeon: Kelsie Agent, MD;  Location: [facility] INVASIVE CV LAB;  Service: Cardiovascular;  Laterality: N/A;   CHEST TUBE INSERTION Right 03/06/2017   Procedure: INSERTION PLEURAL DRAINAGE CATHETER;  Surgeon: Fleeta Hanford Coy, MD;  Location: [facility] OR;  Service: Thoracic;  Laterality: Right;   COLONOSCOPY     CORONARY ARTERY BYPASS GRAFT N/A 12/08/2016   Procedure: CORONARY ARTERY BYPASS GRAFTING (CABG)x4 using left internal mammary artery and bilateral greater saphenous veins harvested endoscopically;  Surgeon: Fleeta Hanford Coy, MD;  Location: [facility] OR;  Service: Open Heart Surgery;  Laterality: N/A;   dental implants     ICD IMPLANT  09/20/2017   ICD IMPLANT N/A 09/20/2017   Procedure: ICD IMPLANT;  Surgeon: Inocencio Soyla Lunger, MD;  Location: [facility] INVASIVE CV LAB;  Service: Cardiovascular;  Laterality: N/A;   IR THORACENTESIS ASP PLEURAL SPACE W/IMG GUIDE  01/24/2017   LEFT HEART CATH AND CORONARY ANGIOGRAPHY N/A 12/03/2016   Procedure: Left Heart Cath and Coronary Angiography;  Surgeon: Wonda Sharper, MD;  Location: [facility] INVASIVE CV LAB;  Service: Cardiovascular;  Laterality: N/A;   REMOVAL OF PLEURAL DRAINAGE CATHETER Right 06/12/2017   Procedure: REMOVAL OF PLEURAL DRAINAGE CATHETER;  Surgeon: Fleeta Hanford Coy, MD;  Location: [facility] OR;  Service: Thoracic;  Laterality: Right;   RIGHT HEART CATH N/A 12/05/2016   Procedure: Right Heart Cath;  Surgeon: Cherrie Toribio SAUNDERS, MD;  Location: [facility] INVASIVE CV LAB;  Service: Cardiovascular;  Laterality: N/A;   TEE WITHOUT CARDIOVERSION N/A 12/08/2016   Procedure: TRANSESOPHAGEAL ECHOCARDIOGRAM (TEE);  Surgeon: Fleeta Hanford,  Coy, MD;  Location: [facility] OR;  Service: Open Heart Surgery;  Laterality: N/A;   TOTAL HIP ARTHROPLASTY Left 03/04/2019   Procedure: LEFT TOTAL HIP ARTHROPLASTY ANTERIOR APPROACH;  Surgeon: Vernetta Lonni GRADE, MD;  Location: [facility] OR;  Service: Orthopedics;  Laterality: Left;   TUBAL LIGATION     Social History   Occupational History   Not on file  Tobacco Use   Smoking status: Former    Current packs/day: 0.00    Types: Cigarettes    Start date: 12/04/1966    Quit date: 12/03/2016    Years since quitting: 7.2   Smokeless tobacco: Never  Vaping Use   Vaping status: Never Used  Substance and Sexual Activity   Alcohol use: No    Comment: Recovering  Alocholic   Drug use: No   Sexual activity: Not on file

## 2024-03-03 ENCOUNTER — Ambulatory Visit
Admission: RE | Admit: 2024-03-03 | Discharge: 2024-03-03 | Disposition: A | Discharge status: Home / Self Care | Source: Ambulatory Visit | Attending: Orthopedic Surgery | Admitting: Orthopedic Surgery

## 2024-03-03 DIAGNOSIS — G8929 Other chronic pain: Secondary | ICD-10-CM

## 2024-03-20 ENCOUNTER — Ambulatory Visit: Admitting: Orthopedic Surgery

## 2024-03-20 DIAGNOSIS — M25512 Pain in left shoulder: Secondary | ICD-10-CM | POA: Diagnosis not present

## 2024-03-21 ENCOUNTER — Encounter: Payer: Self-pay | Admitting: Orthopedic Surgery

## 2024-03-21 NOTE — Progress Notes (Signed)
 Office Visit Note   Patient: Latoya Adams           Date of Birth: 09-19-1945           MRN: [id number] Visit Date: 03/20/2024 Requested by: Felicie Powell Chol, FNP [address] [facility] [place],  [place] [address] PCP: Felicie, Powell Chol, FNP  Subjective: Chief Complaint  Patient presents with   Left Shoulder - Follow-up    HPI: Latoya Adams is a 78 y.o. female who presents to the office reporting left shoulder pain.  Since she was last seen she has had a CT scan.  CT scan shows some interval healing of the greater tuberosity without too much displacement.  She had an injection into her glenohumeral joint last clinic visit which is really helped her significantly.  Has allowed her to sleep which is the biggest improvement.  She cannot do her hair yet.  Or fasten her bra..                ROS: All systems reviewed are negative as they relate to the chief complaint within the history of present illness.  Patient denies fevers or chills.  Assessment & Plan: Visit Diagnoses:  1. Acute pain of left shoulder     Plan: Impression is improvement in left shoulder pain and function with CT scan confirming some healing of the greater tuberosity fragment.  Plan at this time is possible repeat injection in 3 months depending on her symptoms.  For now no surgical intervention indicated.  She will follow-up with us  as needed.  Follow-Up Instructions: No follow-ups on file.   Orders:  No orders of the defined types were placed in this encounter.  No orders of the defined types were placed in this encounter.     Procedures: No procedures performed   Clinical Data: No additional findings.  Objective: Vital Signs: There were no vitals taken for this visit.  Physical Exam:  Constitutional: Patient appears well-developed HEENT:  Head: Normocephalic Eyes:EOM are normal Neck: Normal range of motion Cardiovascular: Normal rate Pulmonary/chest: Effort  normal Neurologic: Patient is alert Skin: Skin is warm Psychiatric: Patient has normal mood and affect  Ortho Exam: Ortho exam demonstrates range of motion on the left of 70/80/120.  She does have pretty reasonable external rotation strength at 5 out of 5 in the left 5+ out of 5 on the right.  No coarse popping or grinding is present.  Motor sensory function to the hand intact  Specialty Comments:  No specialty comments available.  Imaging: No results found.   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   Unilateral primary osteoarthritis, left hip 01/01/2019   Ischemic cardiomyopathy 09/20/2017   Chronic systolic CHF (congestive heart failure) (HCC) 01/08/2017   Tobacco use 01/08/2017   S/P CABG x 4 12/08/2016   Centrilobular emphysema (HCC) 12/06/2016   CAD in native artery 12/06/2016   NSTEMI (non-ST elevated myocardial infarction) (HCC) 12/03/2016   Hypertension, essential 12/03/2016   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   Alcohol abuse    Anxiety    Arthritis    CHF (congestive heart failure) (HCC)    Coronary artery  disease    Depression    Dyspnea    due to Pulmonary Effusion   History of blood transfusion 2018   during CABG   Hypertension    Myocardial infarction [facility]) 12/2016    Family History  Problem Relation Age of Onset   Heart block Mother    Heart block Father    CAD Sister    Heart block Sister    Heart attack Neg Hx     Past Surgical History:  Procedure Laterality Date   BAROREFLEX SYSTEM INSERTION N/A 06/15/2021   Procedure: BAROREFLEX SYSTEM INSERTION;  Surgeon: Kelsie Agent, MD;  Location: [facility] INVASIVE CV LAB;  Service:  Cardiovascular;  Laterality: N/A;   CHEST TUBE INSERTION Right 03/06/2017   Procedure: INSERTION PLEURAL DRAINAGE CATHETER;  Surgeon: Fleeta Hanford Coy, MD;  Location: [facility] OR;  Service: Thoracic;  Laterality: Right;   COLONOSCOPY     CORONARY ARTERY BYPASS GRAFT N/A 12/08/2016   Procedure: CORONARY ARTERY BYPASS GRAFTING (CABG)x4 using left internal mammary artery and bilateral greater saphenous veins harvested endoscopically;  Surgeon: Fleeta Hanford Coy, MD;  Location: [facility] OR;  Service: Open Heart Surgery;  Laterality: N/A;   dental implants     ICD IMPLANT  09/20/2017   ICD IMPLANT N/A 09/20/2017   Procedure: ICD IMPLANT;  Surgeon: Inocencio Soyla Lunger, MD;  Location: [facility] INVASIVE CV LAB;  Service: Cardiovascular;  Laterality: N/A;   IR THORACENTESIS ASP PLEURAL SPACE W/IMG GUIDE  01/24/2017   LEFT HEART CATH AND CORONARY ANGIOGRAPHY N/A 12/03/2016   Procedure: Left Heart Cath and Coronary Angiography;  Surgeon: Wonda Sharper, MD;  Location: [facility] INVASIVE CV LAB;  Service: Cardiovascular;  Laterality: N/A;   REMOVAL OF PLEURAL DRAINAGE CATHETER Right 06/12/2017   Procedure: REMOVAL OF PLEURAL DRAINAGE CATHETER;  Surgeon: Fleeta Hanford Coy, MD;  Location: [facility] OR;  Service: Thoracic;  Laterality: Right;   RIGHT HEART CATH N/A 12/05/2016   Procedure: Right Heart Cath;  Surgeon: Cherrie Toribio SAUNDERS, MD;  Location: [facility] INVASIVE CV LAB;  Service: Cardiovascular;  Laterality: N/A;   TEE WITHOUT CARDIOVERSION N/A 12/08/2016   Procedure: TRANSESOPHAGEAL ECHOCARDIOGRAM (TEE);  Surgeon: Fleeta Hanford, Coy, MD;  Location: [facility] OR;  Service: Open Heart Surgery;  Laterality: N/A;   TOTAL HIP ARTHROPLASTY Left 03/04/2019   Procedure: LEFT TOTAL HIP ARTHROPLASTY ANTERIOR APPROACH;  Surgeon: Vernetta Lonni GRADE, MD;  Location: [facility] OR;  Service: Orthopedics;  Laterality: Left;   TUBAL LIGATION     Social History   Occupational History   Not on file  Tobacco Use   Smoking status: Former    Current packs/day: 0.00    Types:  Cigarettes    Start date: 12/04/1966    Quit date: 12/03/2016    Years since quitting: 7.3   Smokeless tobacco: Never  Vaping Use   Vaping status: Never Used  Substance and Sexual Activity   Alcohol use: No    Comment: Recovering  Alocholic   Drug use: No   Sexual activity: Not on file

## 2024-04-07 ENCOUNTER — Encounter: Payer: Self-pay | Admitting: Radiology

## 2024-04-13 ENCOUNTER — Other Ambulatory Visit (HOSPITAL_COMMUNITY): Payer: Self-pay | Admitting: Internal Medicine

## 2024-04-23 ENCOUNTER — Ambulatory Visit: Payer: Medicare PPO

## 2024-04-23 DIAGNOSIS — I5022 Chronic systolic (congestive) heart failure: Secondary | ICD-10-CM

## 2024-04-24 ENCOUNTER — Ambulatory Visit: Payer: Self-pay | Admitting: Cardiology

## 2024-04-24 LAB — CUP PACEART REMOTE DEVICE CHECK
Battery Remaining Longevity: 46 mo
Battery Voltage: 2.99 V
Brady Statistic RV Percent Paced: 0.01 %
Date Time Interrogation Session: 20251119043627
HighPow Impedance: 75 Ohm
Implantable Lead Connection Status: 753985
Implantable Lead Implant Date: 20190418
Implantable Lead Location: 753860
Implantable Pulse Generator Implant Date: 20190418
Lead Channel Impedance Value: 399 Ohm
Lead Channel Impedance Value: 475 Ohm
Lead Channel Pacing Threshold Amplitude: 1 V
Lead Channel Pacing Threshold Pulse Width: 0.4 ms
Lead Channel Sensing Intrinsic Amplitude: 21.75 mV
Lead Channel Sensing Intrinsic Amplitude: 21.75 mV
Lead Channel Setting Pacing Amplitude: 2.5 V
Lead Channel Setting Pacing Pulse Width: 0.4 ms
Lead Channel Setting Sensing Sensitivity: 0.3 mV
Zone Setting Status: 755011
Zone Setting Status: 755011

## 2024-04-25 NOTE — Progress Notes (Signed)
 Remote ICD Transmission

## 2024-04-27 ENCOUNTER — Other Ambulatory Visit (HOSPITAL_COMMUNITY): Payer: Self-pay | Admitting: Internal Medicine

## 2024-06-04 ENCOUNTER — Encounter: Payer: Self-pay | Admitting: Student

## 2024-06-04 ENCOUNTER — Ambulatory Visit: Admitting: Student

## 2024-06-04 VITALS — BP 84/44 | HR 75 | Ht 67.0 in | Wt 153.0 lb

## 2024-06-04 DIAGNOSIS — I2510 Atherosclerotic heart disease of native coronary artery without angina pectoris: Secondary | ICD-10-CM | POA: Diagnosis not present

## 2024-06-04 DIAGNOSIS — I255 Ischemic cardiomyopathy: Secondary | ICD-10-CM

## 2024-06-04 DIAGNOSIS — I5022 Chronic systolic (congestive) heart failure: Secondary | ICD-10-CM | POA: Diagnosis not present

## 2024-06-04 LAB — CUP PACEART INCLINIC DEVICE CHECK
Battery Remaining Longevity: 54 mo
Battery Voltage: 2.99 V
Brady Statistic RV Percent Paced: 0.01 %
Date Time Interrogation Session: 20251231123427
HighPow Impedance: 81 Ohm
Implantable Lead Connection Status: 753985
Implantable Lead Implant Date: 20190418
Implantable Lead Location: 753860
Implantable Pulse Generator Implant Date: 20190418
Lead Channel Impedance Value: 418 Ohm
Lead Channel Impedance Value: 475 Ohm
Lead Channel Pacing Threshold Amplitude: 1 V
Lead Channel Pacing Threshold Pulse Width: 0.4 ms
Lead Channel Sensing Intrinsic Amplitude: 20.75 mV
Lead Channel Sensing Intrinsic Amplitude: 21.375 mV
Lead Channel Setting Pacing Amplitude: 2.5 V
Lead Channel Setting Pacing Pulse Width: 0.4 ms
Lead Channel Setting Sensing Sensitivity: 0.3 mV
Zone Setting Status: 755011
Zone Setting Status: 755011

## 2024-06-04 NOTE — Progress Notes (Signed)
" °  Cardiology Office Note:   Date:  06/04/2024  ID:  Latoya Adams, DOB 10-24-1945, MRN [id number]  Primary Cardiologist: Toribio Fuel, MD Electrophysiologist: Soyla Gladis Norton, MD   History of Present Illness:   Latoya Adams is a 78 y.o. female with h/o CAD s/ CABG x 4 12/08/2016, chronic systolic CHF (EF 79-74% 09/2017) cMRI EF 24% with only small apex scar, s/p MDT ICD, tobacco use, COPD, hx of R pleural effusion with previous pleurex catheter, and chronic anemia  seen today for routine electrophysiology followup.   Since last being seen in our clinic the patient reports doing well from a cardiac perspective. Mild lightheadedness with rapid standing. None today despite soft pressure. Took morning meds with 1 doughnut and has not had anything else to eat. Otherwise,   she denies chest pain, palpitations, dyspnea, PND, orthopnea, nausea, vomiting, dizziness, syncope, weight gain, or early satiety.  Feels like fluid holds in her belly.   Review of systems complete and found to be negative unless listed in HPI.    EP Information / Studies Reviewed:    EKG is ordered today. Personal review as below.  EKG Interpretation Date/Time:  Wednesday June 04 2024 12:19:00 EST Ventricular Rate:  72 PR Interval:  144 QRS Duration:  100 QT Interval:  440 QTC Calculation: 481 R Axis:   35  Text Interpretation: Normal sinus rhythm Confirmed by Lesia Sharper [phone]) on 06/04/2024 12:28:09 PM    Arrhythmia/Device History Medtronic Single Chamber ICD implanted 09/2017 for CHF  [place] (BAT) 06/2021  CHF- Dr. Fuel - Vest reading is difficult Echo 1/24 EF 25-30% Mild MR Personally reviewed   Barostim Interrogation- Performed personally and reviewed in detail today,  See scanned report  ICD/PPM interrogation - Performed personally and reviewed in detail today.    Physical Exam:   VS:  BP (!) 84/44   Pulse 75   Ht 5' 7 (1.702 m)   Wt 153 lb (69.4 kg)   SpO2 95%   BMI 23.96  kg/m    Wt Readings from Last 3 Encounters:  06/04/24 153 lb (69.4 kg)  02/11/24 149 lb 3.2 oz (67.7 kg)  06/28/23 154 lb 3.2 oz (69.9 kg)     GEN: Well nourished, well developed in no acute distress NECK: No JVD; No carotid bruits CARDIAC: Regular rate and rhythm, no murmurs, rubs, gallops RESPIRATORY:  Clear to auscultation without rales, wheezing or rhonchi  ABDOMEN: Soft, non-tender, non-distended EXTREMITIES:  No edema; No deformity   ASSESSMENT AND PLAN:    Chronic systolic CHF s/p Medtronic Defibrillator and Barostim implantation NYHA II-III symptoms. Not very active.  Device programmed at for chronic settings  Device impedence stable. Normal device function See scanned report.  CAD s/p CABG No s/s of ischemia.     Disposition:   Follow up with EP Team in in 12 months HF f/u due in March  Signed, Sharper Prentice Lesia, PA-C  "

## 2024-06-04 NOTE — Patient Instructions (Signed)
 Medication Instructions:  No medication changes today. *If you need a refill on your cardiac medications before your next appointment, please call your pharmacy*  Lab Work: No labwork ordered today. If you have labs (blood work) drawn today and your tests are completely normal, you will receive your results only by: MyChart Message (if you have MyChart) OR A paper copy in the mail If you have any lab test that is abnormal or we need to change your treatment, we will call you to review the results.  Testing/Procedures: No testing ordered today  Follow-Up: At [facility], you and your health needs are our priority.  As part of our continuing mission to provide you with exceptional heart care, our providers are all part of one team.  This team includes your primary Cardiologist (physician) and Advanced Practice Providers or APPs (Physician Assistants and Nurse Practitioners) who all work together to provide you with the care you need, when you need it.  Your next appointment:   12 month(s) with Electrophysiology Follow up with HF team in March (need to call for this appt)  Provider:   You may see Will Gladis Norton, MD or one of the following Advanced Practice Providers on your designated Care Team:   Charlies Arthur, PA-C Allin Frix Andy Lionel Woodberry, PA-C Suzann Riddle, NP Daphne Barrack, NP    We recommend signing up for the patient portal called MyChart.  Sign up information is provided on this After Visit Summary.  MyChart is used to connect with patients for Virtual Visits (Telemedicine).  Patients are able to view lab/test results, encounter notes, upcoming appointments, etc.  Non-urgent messages can be sent to your provider as well.   To learn more about what you can do with MyChart, go to [email or web address].

## 2024-06-06 ENCOUNTER — Ambulatory Visit: Payer: Self-pay | Admitting: Cardiology

## 2024-07-10 ENCOUNTER — Other Ambulatory Visit (HOSPITAL_COMMUNITY): Payer: Self-pay | Admitting: Internal Medicine
